# Patient Record
Sex: Female | Born: 1955 | Race: Black or African American | Hispanic: No | Marital: Single | Smoking: Former smoker
Health system: Southern US, Community
[De-identification: ages and names within clinical notes are randomized; demographics above are authoritative.]

## PROBLEM LIST (undated history)

## (undated) ENCOUNTER — Emergency Department (HOSPITAL_COMMUNITY): Discharge status: Against Medical Advice | Payer: Self-pay

## (undated) DIAGNOSIS — M7022 Olecranon bursitis, left elbow: Secondary | ICD-10-CM

## (undated) DIAGNOSIS — I219 Acute myocardial infarction, unspecified: Secondary | ICD-10-CM

## (undated) DIAGNOSIS — I10 Essential (primary) hypertension: Secondary | ICD-10-CM

## (undated) DIAGNOSIS — M549 Dorsalgia, unspecified: Secondary | ICD-10-CM

## (undated) DIAGNOSIS — K219 Gastro-esophageal reflux disease without esophagitis: Secondary | ICD-10-CM

## (undated) DIAGNOSIS — T7840XA Allergy, unspecified, initial encounter: Secondary | ICD-10-CM

## (undated) DIAGNOSIS — E119 Type 2 diabetes mellitus without complications: Secondary | ICD-10-CM

## (undated) DIAGNOSIS — F32A Depression, unspecified: Secondary | ICD-10-CM

## (undated) HISTORY — DX: Acute myocardial infarction, unspecified: I21.9

## (undated) HISTORY — DX: Depression, unspecified: F32.A

## (undated) HISTORY — DX: Allergy, unspecified, initial encounter: T78.40XA

## (undated) HISTORY — PX: ABDOMINAL HYSTERECTOMY: SHX81

---

## 1998-06-07 ENCOUNTER — Encounter: Admission: RE | Admit: 1998-06-07 | Discharge: 1998-09-05 | Payer: Self-pay | Admitting: Unknown Physician Specialty

## 1999-11-19 ENCOUNTER — Emergency Department (HOSPITAL_COMMUNITY): Admission: EM | Admit: 1999-11-19 | Discharge: 1999-11-19 | Payer: Self-pay | Admitting: Internal Medicine

## 2004-03-24 ENCOUNTER — Emergency Department (HOSPITAL_COMMUNITY): Admission: EM | Admit: 2004-03-24 | Discharge: 2004-03-24 | Payer: Self-pay | Admitting: Emergency Medicine

## 2005-01-16 ENCOUNTER — Emergency Department (HOSPITAL_COMMUNITY): Admission: EM | Admit: 2005-01-16 | Discharge: 2005-01-16 | Payer: Self-pay | Admitting: Emergency Medicine

## 2006-07-03 ENCOUNTER — Emergency Department (HOSPITAL_COMMUNITY): Admission: EM | Admit: 2006-07-03 | Discharge: 2006-07-03 | Payer: Self-pay | Admitting: Family Medicine

## 2006-12-11 ENCOUNTER — Encounter (HOSPITAL_COMMUNITY): Admission: RE | Admit: 2006-12-11 | Discharge: 2007-02-19 | Payer: Self-pay | Admitting: Internal Medicine

## 2006-12-13 ENCOUNTER — Ambulatory Visit: Payer: Self-pay | Admitting: Oncology

## 2006-12-16 LAB — COMPREHENSIVE METABOLIC PANEL
AST: 12 U/L (ref 0–37)
Alkaline Phosphatase: 48 U/L (ref 39–117)
BUN: 10 mg/dL (ref 6–23)
Creatinine, Ser: 0.5 mg/dL (ref 0.40–1.20)

## 2006-12-16 LAB — CBC & DIFF AND RETIC
BASO%: 0.8 % (ref 0.0–2.0)
EOS%: 0.9 % (ref 0.0–7.0)
MCH: 14.2 pg — ABNORMAL LOW (ref 26.0–34.0)
MCV: 56.5 fL — ABNORMAL LOW (ref 81.0–101.0)
MONO%: 10.8 % (ref 0.0–13.0)
NEUT#: 8.6 10*3/uL — ABNORMAL HIGH (ref 1.5–6.5)
RBC: 4.98 10*6/uL (ref 3.70–5.32)
RDW: 22.6 % — ABNORMAL HIGH (ref 11.3–14.5)
RETIC #: 113.5 10*3/uL (ref 19.7–115.1)
Retic %: 2.3 % (ref 0.4–2.3)

## 2006-12-16 LAB — IRON AND TIBC
Iron: <10 ug/dL — ABNORMAL LOW (ref 42–145)
UIBC: 412 ug/dL

## 2006-12-23 LAB — CBC WITH DIFFERENTIAL/PLATELET
BASO%: 0.9 % (ref 0.0–2.0)
EOS%: 1.3 % (ref 0.0–7.0)
LYMPH%: 14.4 % (ref 14.0–48.0)
MCH: 14.4 pg — ABNORMAL LOW (ref 26.0–34.0)
MCHC: 25.1 g/dL — ABNORMAL LOW (ref 32.0–36.0)
MONO#: 1.2 10*3/uL — ABNORMAL HIGH (ref 0.1–0.9)
Platelets: 569 10*3/uL — ABNORMAL HIGH (ref 145–400)
RBC: 5.12 10*6/uL (ref 3.70–5.32)
WBC: 10.5 10*3/uL — ABNORMAL HIGH (ref 3.9–10.0)

## 2006-12-26 LAB — HOLD TUBE, BLOOD BANK

## 2006-12-30 LAB — CBC WITH DIFFERENTIAL/PLATELET
BASO%: 1.1 % (ref 0.0–2.0)
Basophils Absolute: 0.1 10*3/uL (ref 0.0–0.1)
EOS%: 1.6 % (ref 0.0–7.0)
HGB: 8.3 g/dL — ABNORMAL LOW (ref 11.6–15.9)
MCH: 15.9 pg — ABNORMAL LOW (ref 26.0–34.0)
MCHC: 25.7 g/dL — ABNORMAL LOW (ref 32.0–36.0)
MONO%: 10.2 % (ref 0.0–13.0)
RBC: 5.18 10*6/uL (ref 3.70–5.32)
RDW: 26.2 % — ABNORMAL HIGH (ref 11.3–14.5)
lymph#: 1.2 10*3/uL (ref 0.9–3.3)

## 2006-12-30 LAB — HOLD TUBE, BLOOD BANK

## 2007-01-06 LAB — CBC WITH DIFFERENTIAL/PLATELET
BASO%: 0.7 % (ref 0.0–2.0)
Eosinophils Absolute: 0.2 10*3/uL (ref 0.0–0.5)
MCHC: 26.2 g/dL — ABNORMAL LOW (ref 32.0–36.0)
MONO#: 1.2 10*3/uL — ABNORMAL HIGH (ref 0.1–0.9)
NEUT#: 9.1 10*3/uL — ABNORMAL HIGH (ref 1.5–6.5)
RBC: 4.3 10*6/uL (ref 3.70–5.32)
RDW: 24.5 % — ABNORMAL HIGH (ref 11.3–14.5)
WBC: 12.2 10*3/uL — ABNORMAL HIGH (ref 3.9–10.0)
lymph#: 1.7 10*3/uL (ref 0.9–3.3)

## 2007-01-13 LAB — CBC WITH DIFFERENTIAL/PLATELET
BASO%: 0.6 % (ref 0.0–2.0)
Eosinophils Absolute: 0.2 10*3/uL (ref 0.0–0.5)
HCT: 26.7 % — ABNORMAL LOW (ref 34.8–46.6)
LYMPH%: 13.5 % — ABNORMAL LOW (ref 14.0–48.0)
MONO#: 2.1 10*3/uL — ABNORMAL HIGH (ref 0.1–0.9)
NEUT#: 10.5 10*3/uL — ABNORMAL HIGH (ref 1.5–6.5)
NEUT%: 70.8 % (ref 39.6–76.8)
Platelets: 503 10*3/uL — ABNORMAL HIGH (ref 145–400)
WBC: 14.8 10*3/uL — ABNORMAL HIGH (ref 3.9–10.0)
lymph#: 2 10*3/uL (ref 0.9–3.3)

## 2007-01-13 LAB — IRON AND TIBC
Iron: 368 ug/dL — ABNORMAL HIGH (ref 42–145)
TIBC: 719 ug/dL — ABNORMAL HIGH (ref 250–470)
UIBC: 351 ug/dL

## 2007-01-13 LAB — TECHNOLOGIST REVIEW

## 2007-01-20 LAB — CBC WITH DIFFERENTIAL/PLATELET
BASO%: 0.4 % (ref 0.0–2.0)
EOS%: 1.1 % (ref 0.0–7.0)
Eosinophils Absolute: 0.1 10*3/uL (ref 0.0–0.5)
LYMPH%: 8.8 % — ABNORMAL LOW (ref 14.0–48.0)
MCHC: 27.3 g/dL — ABNORMAL LOW (ref 32.0–36.0)
MCV: 76.6 fL — ABNORMAL LOW (ref 81.0–101.0)
MONO%: 6.9 % (ref 0.0–13.0)
NEUT#: 10.5 10*3/uL — ABNORMAL HIGH (ref 1.5–6.5)
Platelets: 168 10*3/uL (ref 145–400)
RBC: 5.28 10*6/uL (ref 3.70–5.32)
RDW: 36.6 % — ABNORMAL HIGH (ref 11.3–14.5)
WBC: 12.7 10*3/uL — ABNORMAL HIGH (ref 3.9–10.0)

## 2007-01-29 ENCOUNTER — Ambulatory Visit: Payer: Self-pay | Admitting: Oncology

## 2007-01-31 LAB — CBC WITH DIFFERENTIAL/PLATELET
BASO%: 0 % (ref 0.0–2.0)
Eosinophils Absolute: 0.1 10*3/uL (ref 0.0–0.5)
LYMPH%: 11.1 % — ABNORMAL LOW (ref 14.0–48.0)
MCHC: 30.8 g/dL — ABNORMAL LOW (ref 32.0–36.0)
MONO#: 0.5 10*3/uL (ref 0.1–0.9)
NEUT#: 9.6 10*3/uL — ABNORMAL HIGH (ref 1.5–6.5)
RBC: 4.48 10*6/uL (ref 3.70–5.32)
RDW: 37.8 % — ABNORMAL HIGH (ref 11.3–14.5)
WBC: 11.4 10*3/uL — ABNORMAL HIGH (ref 3.9–10.0)
lymph#: 1.3 10*3/uL (ref 0.9–3.3)

## 2007-02-20 ENCOUNTER — Inpatient Hospital Stay (HOSPITAL_COMMUNITY): Admission: AD | Admit: 2007-02-20 | Discharge: 2007-02-20 | Payer: Self-pay | Admitting: Obstetrics

## 2007-02-24 LAB — CBC WITH DIFFERENTIAL/PLATELET
Basophils Absolute: 0 10*3/uL (ref 0.0–0.1)
Eosinophils Absolute: 0.1 10*3/uL (ref 0.0–0.5)
HGB: 13 g/dL (ref 11.6–15.9)
NEUT#: 12.3 10*3/uL — ABNORMAL HIGH (ref 1.5–6.5)
RDW: 28.4 % — ABNORMAL HIGH (ref 11.3–14.5)
WBC: 14.6 10*3/uL — ABNORMAL HIGH (ref 3.9–10.0)
lymph#: 1.5 10*3/uL (ref 0.9–3.3)

## 2007-02-28 ENCOUNTER — Ambulatory Visit (HOSPITAL_COMMUNITY): Admission: RE | Admit: 2007-02-28 | Discharge: 2007-02-28 | Payer: Self-pay | Admitting: Obstetrics & Gynecology

## 2007-03-10 LAB — MORPHOLOGY: PLT EST: ADEQUATE

## 2007-03-10 LAB — CBC WITH DIFFERENTIAL/PLATELET
Basophils Absolute: 0 10*3/uL (ref 0.0–0.1)
Eosinophils Absolute: 0.1 10*3/uL (ref 0.0–0.5)
HGB: 11.7 g/dL (ref 11.6–15.9)
LYMPH%: 9.8 % — ABNORMAL LOW (ref 14.0–48.0)
MCV: 76.1 fL — ABNORMAL LOW (ref 81.0–101.0)
MONO#: 0.6 10*3/uL (ref 0.1–0.9)
MONO%: 3.7 % (ref 0.0–13.0)
NEUT#: 13.8 10*3/uL — ABNORMAL HIGH (ref 1.5–6.5)
Platelets: 396 10*3/uL (ref 145–400)
RDW: 24 % — ABNORMAL HIGH (ref 11.3–14.5)

## 2007-03-10 LAB — IRON AND TIBC: Iron: 21 ug/dL — ABNORMAL LOW (ref 42–145)

## 2007-03-17 ENCOUNTER — Ambulatory Visit: Payer: Self-pay | Admitting: Oncology

## 2007-03-24 LAB — CBC WITH DIFFERENTIAL/PLATELET
Basophils Absolute: 0 10*3/uL (ref 0.0–0.1)
EOS%: 0.6 % (ref 0.0–7.0)
HCT: 37.2 % (ref 34.8–46.6)
HGB: 12 g/dL (ref 11.6–15.9)
MCH: 24.3 pg — ABNORMAL LOW (ref 26.0–34.0)
MCV: 75.1 fL — ABNORMAL LOW (ref 81.0–101.0)
MONO%: 5.1 % (ref 0.0–13.0)
NEUT%: 86.1 % — ABNORMAL HIGH (ref 39.6–76.8)
RDW: 22.3 % — ABNORMAL HIGH (ref 11.3–14.5)

## 2007-04-02 ENCOUNTER — Encounter: Payer: Self-pay | Admitting: Obstetrics & Gynecology

## 2007-04-04 ENCOUNTER — Inpatient Hospital Stay (HOSPITAL_COMMUNITY): Admission: AD | Admit: 2007-04-04 | Discharge: 2007-04-06 | Payer: Self-pay | Admitting: Obstetrics & Gynecology

## 2007-04-12 ENCOUNTER — Inpatient Hospital Stay (HOSPITAL_COMMUNITY): Admission: AD | Admit: 2007-04-12 | Discharge: 2007-04-12 | Payer: Self-pay | Admitting: Obstetrics

## 2007-05-07 ENCOUNTER — Ambulatory Visit: Payer: Self-pay | Admitting: Oncology

## 2007-05-09 LAB — CBC WITH DIFFERENTIAL/PLATELET
EOS%: 1.2 % (ref 0.0–7.0)
MCH: 25.3 pg — ABNORMAL LOW (ref 26.0–34.0)
MCV: 75.7 fL — ABNORMAL LOW (ref 81.0–101.0)
MONO%: 5.1 % (ref 0.0–13.0)
RBC: 5.7 10*6/uL — ABNORMAL HIGH (ref 3.70–5.32)
RDW: 21.5 % — ABNORMAL HIGH (ref 11.3–14.5)

## 2007-05-09 LAB — COMPREHENSIVE METABOLIC PANEL
AST: 10 U/L (ref 0–37)
Albumin: 4.2 g/dL (ref 3.5–5.2)
Alkaline Phosphatase: 68 U/L (ref 39–117)
Potassium: 4 mEq/L (ref 3.5–5.3)
Sodium: 136 mEq/L (ref 135–145)
Total Protein: 6.6 g/dL (ref 6.0–8.3)

## 2007-05-09 LAB — IRON AND TIBC: UIBC: 255 ug/dL

## 2007-05-09 LAB — FERRITIN: Ferritin: 85 ng/mL (ref 10–291)

## 2007-07-08 ENCOUNTER — Ambulatory Visit: Payer: Self-pay | Admitting: Oncology

## 2007-08-08 LAB — CBC WITH DIFFERENTIAL/PLATELET
BASO%: 0.1 % (ref 0.0–2.0)
LYMPH%: 11.9 % — ABNORMAL LOW (ref 14.0–48.0)
MCHC: 33 g/dL (ref 32.0–36.0)
MONO#: 0.7 10*3/uL (ref 0.1–0.9)
NEUT#: 12.7 10*3/uL — ABNORMAL HIGH (ref 1.5–6.5)
RBC: 5.75 10*6/uL — ABNORMAL HIGH (ref 3.70–5.32)
RDW: 17 % — ABNORMAL HIGH (ref 11.3–14.5)
WBC: 15.4 10*3/uL — ABNORMAL HIGH (ref 3.9–10.0)
lymph#: 1.8 10*3/uL (ref 0.9–3.3)

## 2007-08-08 LAB — MORPHOLOGY
PLT EST: ADEQUATE
RBC Comments: NORMAL

## 2007-08-08 LAB — CHCC SMEAR

## 2007-08-13 LAB — IRON AND TIBC: UIBC: 237 ug/dL

## 2007-08-13 LAB — LACTATE DEHYDROGENASE: LDH: 154 U/L (ref 94–250)

## 2007-08-13 LAB — HEMOGLOBINOPATHY EVALUATION
Hemoglobin Other: 0 % (ref 0.0–0.0)
Hgb A: 97.4 % (ref 96.8–97.8)
Hgb F Quant: 0 % (ref 0.0–2.0)
Hgb S Quant: 0 % (ref 0.0–0.0)

## 2007-08-13 LAB — COMPREHENSIVE METABOLIC PANEL
ALT: 11 U/L (ref 0–35)
Alkaline Phosphatase: 76 U/L (ref 39–117)
CO2: 26 mEq/L (ref 19–32)
Creatinine, Ser: 0.92 mg/dL (ref 0.40–1.20)
Total Bilirubin: 0.3 mg/dL (ref 0.3–1.2)

## 2007-10-29 ENCOUNTER — Ambulatory Visit: Payer: Self-pay | Admitting: Oncology

## 2008-03-16 ENCOUNTER — Ambulatory Visit (HOSPITAL_COMMUNITY): Admission: RE | Admit: 2008-03-16 | Discharge: 2008-03-16 | Payer: Self-pay | Admitting: Obstetrics & Gynecology

## 2008-03-24 ENCOUNTER — Encounter: Admission: RE | Admit: 2008-03-24 | Discharge: 2008-03-24 | Payer: Self-pay | Admitting: Obstetrics & Gynecology

## 2008-04-06 ENCOUNTER — Inpatient Hospital Stay (HOSPITAL_COMMUNITY): Admission: EM | Admit: 2008-04-06 | Discharge: 2008-04-09 | Payer: Self-pay | Admitting: Emergency Medicine

## 2008-08-02 ENCOUNTER — Emergency Department (HOSPITAL_COMMUNITY): Admission: EM | Admit: 2008-08-02 | Discharge: 2008-08-02 | Payer: Self-pay | Admitting: Family Medicine

## 2009-01-05 ENCOUNTER — Emergency Department (HOSPITAL_COMMUNITY): Admission: EM | Admit: 2009-01-05 | Discharge: 2009-01-05 | Payer: Self-pay | Admitting: Emergency Medicine

## 2009-05-26 ENCOUNTER — Emergency Department (HOSPITAL_COMMUNITY): Admission: EM | Admit: 2009-05-26 | Discharge: 2009-05-26 | Payer: Self-pay | Admitting: Emergency Medicine

## 2010-10-02 ENCOUNTER — Encounter: Payer: Self-pay | Admitting: Obstetrics & Gynecology

## 2011-01-23 NOTE — H&P (Signed)
[name], [name]            ACCOUNT NO.:  [id number]   MEDICAL RECORD NO.:  [id number]          PATIENT TYPE:  INP   LOCATION:  9306                          FACILITY:  [facility]   PHYSICIAN:  Roseanna Rainbow, M.D.DATE OF BIRTH:  1956/08/13   DATE OF ADMISSION:  04/02/2007  DATE OF DISCHARGE:                              HISTORY & PHYSICAL   CHIEF COMPLAINT:  Patient is a 55 year old African-American female with  a large leiomyomatous uterus with secondary vaginal hemorrhage.   HISTORY OF PRESENT ILLNESS:  The patient gives a long history of  menometrorrhagia.  She was referred by Dr. Allyne Gee of [facility].  A hemoglobin in April, 2008 was 5.3, and she was transfused  at that point.  Workup to date has included an ultrasound on June 20th  that demonstrated the markedly enlarged uterus, measuring at least 16 cm  in sagittal diameter with multiple fibroids.  There was no  hydronephrosis.  TSH and prolactin were normal.  A Pap smear was  negative.   The patient presented to the office today with heavy bleeding.  She  reports changing a pad every two hours.  She also complains of a  headache.   An attempt was made to produce an amenorrheic state with high dose Depo-  Provera at the patient's last visit in early June.   ALLERGIES:  No known drug allergies.   MEDICATIONS:  Please see the medication reconciliation form.   PAST MEDICAL HISTORY:  Depression, hypertension.   SOCIAL HISTORY:  She is divorced.  She is retired.  She currently smokes  one pack per day.  She does not give any significant history of alcohol  use.  She denies illicit drug use.   FAMILY HISTORY:  Remarkable for diabetes mellitus and breast cancer.   PAST GYN HISTORY:  Noncontributory.   PAST OBSTETRICAL HISTORY:  There is a history of four miscarriages or  abortions.  She has no living children.   REVIEW OF SYSTEMS:  NEUROLOGIC:  Please see the above.  GU:  Please see  the above.   PHYSICAL EXAMINATION:  VITAL SIGNS:  Temperature 98.4, pulse 84, blood  pressure 153/74.  Height 5 feet 8 inches.  Weight 227 pounds.  GENERAL:  Well-developed, well-nourished, no apparent distress.  LUNGS:  Clear to auscultation bilaterally.  HEART:  Regular rate and rhythm.  ABDOMEN:  There is a mass arising from the pelvis in the midline to the  level of the umbilicus.  This mass is nontender.  PELVIC:  Normal EG/BUS.  Speculum exam of the vagina is clean.  The  cervix is clear, firm, and closed.  No visible lesions.  No abnormal  discharge.  On bimanual exam, the uterus is markedly enlarged at  approximately 20 weeks, aggregate size with broad ligament involvement  of the fibroids on the right side.   ASSESSMENT:  Symptomatic large leiomyomatous uterus with a secondary  vaginal hemorrhage in the setting of iron-deficiency anemia.  She is  hemodynamically stable at present.   PLAN:  Admission.  Will check a hemoglobin, strict pad count.  The plan  is to proceed with a total abdominal hysterectomy on July 24th.  The  risks, benefits and alternative forms of management were reviewed with  the patient, and informed consent was obtained.  Education sheets were  given regarding hysterectomy.  She was counseled regarding prophylactic  oophorectomy at the time of surgery.  She was also counseled that in the  likelihood she is profoundly anemic, she will be transfused  preoperatively.      Roseanna Rainbow, M.D.  Electronically Signed     LAJ/MEDQ  D:  04/02/2007  T:  04/02/2007  Job:  [id number]   cc:   Candyce Churn. Allyne Gee, M.D.  Fax: [phone]

## 2011-01-23 NOTE — Discharge Summary (Signed)
[name], [name]            ACCOUNT NO.:  [id number]   MEDICAL RECORD NO.:  [id number]          PATIENT TYPE:  INP   LOCATION:  9306                          FACILITY:  [facility]   PHYSICIAN:  Roseanna Rainbow, M.D.DATE OF BIRTH:  Jul 13, 1956   DATE OF ADMISSION:  04/04/2007  DATE OF DISCHARGE:  04/06/2007                               DISCHARGE SUMMARY   CHIEF COMPLAINT:  The patient is a 55 year old African-American female  with large leiomyomatous uterus with secondary vaginal hemorrhage.  Please see the dictated history and physical for further details.   HOSPITAL COURSE:  The patient was admitted. She underwent a total  abdominal hysterectomy. Please see the dictated operative summary for  further details. Please note that preoperatively, her capillary blood  glucose testing values between 250 and 400 on April 03, 2007.  Preoperatively, her hemoglobin was 11.8. She had not been previously  diagnosed. She had a remote history of glucose intolerance, however, she  had lost a significant amount of weight and this had resolved, as per  the patient. On postoperative day 1, her hemoglobin was 11.3.  Preoperatively, she had been started on oral hypoglycemic agents. She  was also given a sliding scale insulin regimen. For the remainder of her  course, her capillary blood glucose values were less than 250. On  postoperative day 2, she was tolerating a regular ADA diet. She was then  discharged to home. A hemoglobin A1C was 9.3 on April 03, 2007.   DISCHARGE DIAGNOSES:  1. Symptomatic uterine fibroids.  2. Diabetes mellitus.   PROCEDURE:  1. Total abdominal hysterectomy.   CONDITION ON DISCHARGE:  Stable.   DIET:  ADA diet.   ACTIVITY:  Pelvic rest. No strenuous activity.   DISPOSITION:  The patient was to followup in the office in 1 week for  staple removal and she was to call Dr. Dorothyann Peng to arrange  followup.   DISCHARGE MEDICATIONS:  Included Glucophage 2 grams  with dinner,  Percocet 1 to 2 tabs every 6 hours as needed, Glipizide 5 mg with  breakfast, Ibuprofen, Lisinopril, Repleva, Cymbalta, and Metanx tablets.      Roseanna Rainbow, M.D.  Electronically Signed     LAJ/MEDQ  D:  04/25/2007  T:  04/26/2007  Job:  [id number]   cc:   Candyce Churn. Allyne Gee, M.D.  Fax: [phone]

## 2011-01-23 NOTE — H&P (Signed)
[name], [name]            ACCOUNT NO.:  [id number]   MEDICAL RECORD NO.:  [id number]          PATIENT TYPE:  INP   LOCATION:  2921                         FACILITY:  [facility]   PHYSICIAN:  Hillery Aldo, M.D.   DATE OF BIRTH:  1956/03/04   DATE OF ADMISSION:  04/06/2008  DATE OF DISCHARGE:                              HISTORY & PHYSICAL   PRIMARY CARE PHYSICIAN:  Robyn N. Allyne Gee, M.D.   CHIEF COMPLAINT:  Rash/boils.   HISTORY OF PRESENT ILLNESS:  The patient is a 55 year old female with a  past medical history of boils in the past who presents with multiple  skin boils especially in the groin, vaginal area, and in the upper peri-  umbilical area on the abdomen surrounded by extensive erythema.  The  patient also has some boils on her breast bilaterally.  There is a  history of similar eruptions which have responded to incision and  drainage as well as antibiotics.  The patient denies any fever or  chills.  She presented to the hospital at the behest of her OB/GYN for  evaluation.   PAST MEDICAL HISTORY:  1. Anxiety/depression.  2. Hypertension.  3. Diabetes mellitus x4 years.   PAST SURGICAL HISTORY:  1. TAH secondary to fibroids and menometrorrhagia.  2. Incision and drainage of skin boil.   FAMILY HISTORY:  The patient's mother is alive at 74 and has diabetes  and hypertension.  The patient's father is alive at 8 and also has  diabetes and hypertension.  She has one sister who died at the age of 80  from breast cancer.  One healthy brother.  She has no offsprings.   SOCIAL HISTORY:  The patient is divorced.  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  She has a history of  smoking tobacco, 1-pack per day, but did quit for approximately 12  years.  She is currently smoking about a pack per day.  She denies any  alcohol or drug use.   ALLERGIES:  No known drug allergies.   CURRENT MEDICATIONS:  1. Actos.  2. Lisinopril.  3.  Cymbalta (discontinued on own several weeks ago).   REVIEW OF SYSTEMS:  Only positive for headache and occasional  constipation.  Comprehensive 14-point review of systems is otherwise  negative.   PHYSICAL EXAMINATION:  VITAL SIGNS:  Temperature 98, pulse 78,  respirations 14, blood pressure 153/82, and O2 saturation 99% on room  air.  GENERAL:  Obese, black female who is in no acute distress.  HEENT:  Normocephalic and atraumatic. PERRL.  EOMI.  Oropharynx is  clear.  NECK:  Supple, no thyromegaly, no lymphadenopathy, no jugular venous  distention.  CHEST:  Clear to auscultation bilaterally with good air movement.  HEART:  Regular rate, and rhythm.  No murmurs, rubs, or gallops.  BREAST:  Boils bilaterally.  ABDOMEN:  Soft, nontender, and nondistended with normoactive bowel  sounds.  There is a large erythematous central area with some fluctuance  concerning for an underlying abscess.  EXTREMITIES:  No clubbing, edema, and cyanosis.  SKIN:  Multiple  boils in the groin, breast, and mid abdomen as  described.  NEUROLOGIC:  Alert and oriented x3.  Cranial nerves II through XII is  grossly intact.  Nonfocal.   DATA REVIEW:  Sodium is 139, potassium 3.7, chloride 106, bicarb 14, BUN  14, creatinine 0.5, and glucose 296.  White blood cell count is 21.3,  hemoglobin 15, hematocrit 47.6, and platelets 218.  Venous blood gas  reveals a pH of 7.391, PCO2 44.8, and PO2 24.  Urinalysis is positive  for glucose that is greater than 100 mg/dL, ketones of greater 161  mg/dL, but is negative for nitrites and leukocyte esterase.  Serum  ketones are weakly positive.   ASSESSMENT AND PLAN:  1. Early diabetic ketoacidosis:  The patient does have a anion-gap      metabolic acidosis.  We will check arterial blood gases on      Saturday.  We will initiate insulin drip via Glucommander protocol      and vigorously hydrate her.  We will transition over to      subcutaneous insulin when indicated.  2.  Cellulitis/abscess:  The patient has a history of multiple skin      abscesses in the past.  This was likely staph infection.  She has      been empirically started on vancomycin which she will continue.  We      will get a surgical consultation for incision and drainage of the      likely large abscess in her mid-abdomen.  We performed a culture of      her naris to see if she is colonized with staph as well.  She will      use Hibiclens showers daily.  3. Hypertension:  We will resume the patient's lisinopril at a dose of      10 mg daily.  We will clarify home dosage.  4. Anxiety/depression:  We will start the patient back on Cymbalta at      a dose of 30 mg daily and titrate it to effect.  5. Prophylaxis:  We will initiate PAS hoses for deep vein thrombosis      prophylaxis given that she will likely require a surgical incision      and drainage of her abscess.  We will place the patient on Protonix      for gastrointestinal stress ulcer prophylaxis.      Hillery Aldo, M.D.  Electronically Signed     CR/MEDQ  D:  04/06/2008  T:  04/07/2008  Job:  [id number]   cc:   Candyce Churn. Allyne Gee, M.D.

## 2011-01-23 NOTE — Discharge Summary (Signed)
[name], [name]            ACCOUNT NO.:  [id number]   MEDICAL RECORD NO.:  [id number]          PATIENT TYPE:  INP   LOCATION:  6731                         FACILITY:  [facility]   PHYSICIAN:  Ladell Pier, M.D.   DATE OF BIRTH:  12-01-1955   DATE OF ADMISSION:  04/06/2008  DATE OF DISCHARGE:  04/09/2008                               DISCHARGE SUMMARY   DISCHARGE DIAGNOSES:  1. Abdominal wall cellulitis.  2. Below-knee amputation.  3. Hypertension.  4. Type 2 diabetes x4 years.  5. Anxiety.  6. Depression.  7. Hypokalemia.  8. Leukocytosis.   DISCHARGE MEDICATIONS:  1. Doxycycline 100 mg twice daily x14 days.  2. Lisinopril 40 mg daily.  3. Hydrochlorothiazide 12.5 mg daily.  4. Actos 45 mg daily.  5. Cymbalta 30 mg daily.  6. Lantus 10 units at bedtime.  7. NovoLog sliding scale insulin as directed.   FOLLOWUP APPOINTMENT:  The patient is to follow up with Dr. Allyne Gee in 1  week.   PROCEDURES:  None.   CONSULTANTS:  General Surgery.   HISTORY OF PRESENT ILLNESS:  The patient is a 55 year old female with a  past medical history of falls in the past.  She presented to the  emergency room with multiple boils especially in the groin, vaginal  area, and marginal areas of the abdomen surrounded by extensive  erythema.  Please see Dr. York Ram admission note for remainder of  history.   Past medical history, family history, social history, allergies, review  of systems as per admission H&P.   PHYSICAL EXAMINATION ON DISCHARGE:  VITAL SIGNS:  Temperature is 98,  pulse 60, respirations 18, blood pressure 179/77, and pulse ox 94% on  room air.  CBG 186-192.  HEENT: Normocephalic and atraumatic.  Pupils reactive to light.  Throat  without erythema.  CARDIOVASCULAR:  Regular rate and rhythm.  LUNGS:  Clear bilaterally.  ABDOMEN:  Positive bowel sounds.  She does have some induration and  erythema on the abdominal wall that is much improved with minimal  tenderness.  EXTREMITIES:  Without edema.   HOSPITAL COURSE:  1. Boil/cellulitis/abscess especially in the abdominal wall:  The      patient was admitted to the hospital.  She was placed on      vancomycin.  General Surgery was consulted.  They followed the      patient throughout her hospital stay.  They did not feel that      drainage was indicated.  They recommend continuing antibiotic for      resolution of the abdominal wall cellulitis/abscess.  2. The patient will be discharged home on doxycycline for 14 days.      She will then follow up with her primary care physician within a      week to see if she needs to continue with extended treatment.  3. Type 2 diabetes:  The patient came in, she was hyperglycemic.  She      was placed on Glucommander drip.  Readings noted that she had DKA,      but reviewing her bicarb, her bicarb was normal at 21 when  she came      in.  The patient most likely was hyperglycemic.  She was spilling      glucose in her urine.  She was placed on a Glucommander drip and      her blood sugars normalized.  We started her on her Actos, sent her      home also on Lantus and sliding scale insulin.  When she follows up      with her primary care doctor, she will readdress whether she should      go on something like Januvia metformin or other medications then      she could to wean off the Lantus and sliding scale insulin.  Also,      discussed this with the patient.  4. Hypertension:  Her blood pressure was high while she was inpatient,      increased her lisinopril, blood pressure still was elevated, added      hydrochlorothiazide.  She will follow up with her primary care to      further recheck her blood pressures.  5. Leukocytosis:  She was noted to have leukocytosis on admission.      This is most likely secondary to the abscess/cellulitis of the      abdominal wall.  6. Hypokalemia:  She was noted to be hypokalemic and her potassium was      repleted.   DISCHARGE  LABS:  We did a nasal swab that did not show any Staph.  Sodium 135, potassium 3.4, chloride 105, CO2 25, glucose 193, BUN 7, and  creatinine 0.40.  WBC 10.4, hemoglobin 13.1, platelet 220,000, and MCV  77.2.  Hemoglobin A1c 13.7.      Ladell Pier, M.D.  Electronically Signed     NJ/MEDQ  D:  04/09/2008  T:  04/10/2008  Job:  [id number]   cc:   Candyce Churn. Allyne Gee, M.D.

## 2011-01-23 NOTE — Consult Note (Signed)
NAMEMarland Kitchen  DEMYAH, SMYRE NO.:  [id number]   MEDICAL RECORD NO.:  [id number]          PATIENT TYPE:  INP   LOCATION:  2921                         FACILITY:  [facility]   PHYSICIAN:  Velora Heckler, MD      DATE OF BIRTH:  08-Jun-1956   DATE OF CONSULTATION:  DATE OF DISCHARGE:                                 CONSULTATION   REQUESTING PHYSICIAN:  Hillery Aldo, MD   CONSULTING SURGEON:  Velora Heckler, MD, FACS   PRIMARY CARE PHYSICIAN:  Candyce Churn. Allyne Gee, MD   OB/GYN:  Dr. Jeraldine Loots.   REASON FOR CONSULTATION:  Abdominal abscess/cellulitis.   HISTORY OF PRESENT ILLNESS:  Ms. Hannig is a 55 year old black female  with a history of depression, diabetes, and hypertension who began  having abdominal pain on Friday due to a boil.  She states that she had  a small lesion on her right breast that ruptured last week and then on  Friday she began to get a swell on her abdomen.  She states that it has  progressively gotten larger and increased in pain.  Therefore, today  because of this increasing pain, she presented to the ER.  Once here,  the patient was admitted by Dr. Darnelle Catalan in her service and her white blood  cell count was found to be 21,300 with a CBG of 296.  Because of this  abdominal cellulitis, we were asked to see the patient for possible  surgical intervention.   REVIEW OF SYSTEMS:  Please see HPI.  Otherwise, all other systems are  negative.   FAMILY HISTORY:  Noncontributory.   PAST MEDICAL HISTORY:  1. Significant for depression.  2. Diabetes mellitus.  3. Hypertension.   PAST SURGICAL HISTORY:  1. Abdominal hysterectomy.  Her ovary were left in place.  2. A recent I&D by Dr. Jeraldine Loots, the patient's OB/GYN, for an abscess in      her left groin.   SOCIAL HISTORY:  The patient is divorced and currently does not have any  children.  She admits to smoking approximately 1 pack of cigarettes a  day and however she does not drink or does not use any illicit  drugs.  She also admits to being a Runner, broadcasting/film/video.   ALLERGIES:  NKDA.   MEDICATIONS:  Doses are not known.  1. Cymbalta.  2. Actos.  3. Lisinopril.  The patient also states that she has not taken all of these in at least  one month.   PHYSICAL EXAMINATION:  GENERAL:  This is a 55 year old black female who  is lying in bed currently, in no acute distress.  VITAL SIGNS:  Temperature 97.9, pulse 96, respirations 14, and blood  pressure 130/80.  EYES:  Sclerae noninjected.  Pupils were equal, round, and reactive to  light.  EARS, NOSE, MOUTH, AND THROAT:  Ears and nose, no obvious masses or  lesions.  No rhinorrhea.  Mouth is pink and moist with poor dentition.  Throat shows no exudate.  NECK:  Supple.  Trachea is midline.  No thyromegaly.  HEART:  Regular rate and rhythm.  Normal S1 and S2.  No murmurs,  gallops, or rubs are noted.  A +2 carotid, pedal, and radial pulses  bilaterally.  LUNGS:  Clear to auscultation bilaterally with no wheezes, rhonchi, or  rales noted.  Respiratory effort is nonlabored.  CHEST:  Symmetrical.  ABDOMEN:  Soft.  Very tender over a large approximately 15 x 8 cm  abdominal wall abscess/cellulitis.  At the top of this area, there  appear to be three small openings, which could contribute to this  abscess.  Currently, these are not draining.  This area does appear to  feel indurated and not fluctuant.  Otherwise, the belly is obese with  active bowel sounds.  Also, of note over this large area, the patient's  skin is very warm to touch and is very erythematous.  MUSCULOSKELETAL:  All 4 extremities are symmetrical with no cyanosis,  clubbing, or edema.  SKIN:  Warm and dry.  There is an area at approximately 5 o'clock on the  right breast where it appears there was an old boil that has begun  healing.  There are no other obvious rashes, lesions, or masses.  However, the patient does have some areas along her back and arms that  appear to have some  hypopigmentation to them.  NEUROLOGIC:  The patient's cranial nerves II through XII appear to be  grossly intact.  Deep tendon reflex exam is deferred at this time.  PSYCH:  The patient is alert and oriented x3 with an appropriate affect.   LABS AND DIAGNOSTICS:  White blood cell count is 21,300.  Hemoglobin is  15, hematocrit 47.3, platelets are 218,000.  Neutrophils 87%, sodium  139, potassium 3.7, BUN 14, creatinine 0.5, and glucose is 296.   IMPRESSION:  1. Abdominal wall cellulitis  2. Diabetes.  3. Hypertension.   PLAN:  At this time, we agree with the patient being put on antibiotics.  Since this area appears to be presenting like a MRSA cellulitis with the  fact that this area is not fluctuant, but indurated, we do not feel that  the patient would benefit from an I&D at this time.  Therefore, we would  recommend CBC in the morning.  If for some reason the  patient begins to worsen or this area begins to feel more fluctuant, the  patient may benefit from some kind of diagnostic imaging or operative  intervention.  The patient has been discussed with Dr. Gerrit Friends and he has  seen and examined the patient with me.      Letha Cape, PA      Velora Heckler, MD  Electronically Signed    KEO/MEDQ  D:  04/06/2008  T:  04/07/2008  Job:  [id number]   cc:   Hillery Aldo, M.D.  Candyce Churn. Allyne Gee, M.D.  Dr. Jeraldine Loots

## 2011-01-23 NOTE — Op Note (Signed)
[name], [name]            ACCOUNT NO.:  [id number]   MEDICAL RECORD NO.:  [id number]          PATIENT TYPE:  INP   LOCATION:  9306                          FACILITY:  [facility]   PHYSICIAN:  Roseanna Rainbow, M.D.DATE OF BIRTH:  June 30, 1956   DATE OF PROCEDURE:  04/04/2007  DATE OF DISCHARGE:                               OPERATIVE REPORT   PREOPERATIVE DIAGNOSIS:  Symptomatic uterine fibroids with secondary  menometrorrhagia.   POSTOPERATIVE DIAGNOSIS:  Symptomatic uterine fibroids with secondary  menometrorrhagia.   PROCEDURE:  Total abdominal hysterectomy.   SURGEON:  Roseanna Rainbow, M.D.   ANESTHESIA:  General endotracheal anesthesia.   ESTIMATED BLOOD LOSS:  200 mL.   COMPLICATIONS:  None.   IV FLUIDS AND URINE OUTPUT:  As per anesthesiology.   PROCEDURE IN DETAIL:  The patient was taken to the operating room with  an IV running.  The patient was given general anesthesia without  difficulty.  She was placed in the dorsal lithotomy position and prepped  and draped in the usual sterile fashion.  After a time out, a midline  vertical incision was then made with the scalpel and carried bluntly  down to the fascia.  The fascia was incised along the length of the  incision.  The parietal peritoneum was tented up and entered.  The  peritoneal incision was then extended superiorly and inferiorly with  good visualization of the bladder.   At this point, the diffusely enlarged uterus, irregular in shape and  contour, was delivered into the incision.  The bowel was packed away  with moistened laparotomy packs.  The Deaver was replaced into the  incision for retraction.  A core was placed in the uterine fundus to  assist with elevation of the uterus.  The round ligaments on both sides  were divided with the Bovie.  The broad ligament was then incised  anteriorly to the midline bilaterally.  The fallopian tubes and utero-  ovarian ligaments bilaterally were doubly  clamped, transected and both  free ligatures and suture ligatures of 0 Vicryl were placed.  Adequate  hemostasis was noted.  The uterine arteries were then clamped  bilaterally, transected and suture ligated with 0 Vicryl.  At this  point, the uterine fundus was amputated above the level of the uterine  artery pedicles.  A Balfour retractor was then placed into the incision  and the bowel repacked with moistened laparotomy packs.  Kochers had  been placed on the lower uterine segment.   The cardinal ligaments and uterosacral ligaments were then clamped with  parametrial clamps, transected and suture ligated.  The cervix was then  amputated with scissors.  The remainder of the vaginal cuff was closed  with a series of figure-of-eight sutures of 0 Vicryl.  Adequate  hemostasis was noted.  The pelvis was then copiously irrigated.  The  pedicles and the vaginal cuff were inspected and hemostasis was felt to  be adequate.  The retractors and all the sponges were then removed from  the abdomen.  The fascia and parietal peritoneum were closed in a single  layer  with a  running suture of #1 PDS.  The skin was closed with staples.  At  the close of the procedure, the instrument and pack counts were said to  be correct x2.  The patient was awakened from general anesthesia and  taken to the PACU in stable condition.      Roseanna Rainbow, M.D.  Electronically Signed     LAJ/MEDQ  D:  04/04/2007  T:  04/04/2007  Job:  [id number]

## 2011-06-08 LAB — BASIC METABOLIC PANEL
BUN: 11
BUN: 11
BUN: 7
CO2: 25
CO2: 26
Calcium: 8.4
Calcium: 8.5
Calcium: 8.5
Chloride: 105
Chloride: 106
Creatinine, Ser: 0.31 — ABNORMAL LOW
Creatinine, Ser: 0.4
GFR calc Af Amer: >60
GFR calc Af Amer: >60
GFR calc Af Amer: >60
GFR calc non Af Amer: >60
GFR calc non Af Amer: >60
GFR calc non Af Amer: >60
GFR calc non Af Amer: >60
Glucose, Bld: 129 — ABNORMAL HIGH
Potassium: 3.3 — ABNORMAL LOW
Potassium: 3.4 — ABNORMAL LOW
Potassium: 3.4 — ABNORMAL LOW
Potassium: 3.6
Sodium: 135
Sodium: 137
Sodium: 138
Sodium: 139
Sodium: 140

## 2011-06-08 LAB — CBC
HCT: 40.6
HCT: 42.1
Hemoglobin: 13
MCHC: 31.7
MCHC: 32.3
MCV: 77.2 — ABNORMAL LOW
MCV: 78.3
MCV: 78.9
Platelets: 200
RBC: 5.26 — ABNORMAL HIGH
RBC: 5.37 — ABNORMAL HIGH
RBC: 6 — ABNORMAL HIGH
RDW: 15.6 — ABNORMAL HIGH
RDW: 15.9 — ABNORMAL HIGH
WBC: 11.6 — ABNORMAL HIGH

## 2011-06-08 LAB — POCT I-STAT, CHEM 8
BUN: 14
Creatinine, Ser: 0.5
Hemoglobin: 17.3 — ABNORMAL HIGH
Potassium: 3.7
Sodium: 139
Sodium: 140
TCO2: 23

## 2011-06-08 LAB — HEPATIC FUNCTION PANEL
ALT: 22
AST: 16
Indirect Bilirubin: 0.6
Total Protein: 6.8

## 2011-06-08 LAB — DIFFERENTIAL
Basophils Relative: 0
Eosinophils Absolute: 0.1
Monocytes Absolute: 1.7 — ABNORMAL HIGH
Monocytes Relative: 8
Neutrophils Relative %: 87 — ABNORMAL HIGH

## 2011-06-08 LAB — POCT I-STAT 3, ART BLOOD GAS (G3+)
pCO2 arterial: 35.5
pH, Arterial: 7.439 — ABNORMAL HIGH

## 2011-06-08 LAB — MRSA CULTURE

## 2011-06-08 LAB — POCT I-STAT 3, VENOUS BLOOD GAS (G3P V)
Acid-Base Excess: 2
O2 Saturation: 42
pCO2, Ven: 44.8 — ABNORMAL LOW

## 2011-06-08 LAB — POCT URINALYSIS DIP (DEVICE)
Glucose, UA: >=1000 — AB
Ketones, ur: >=160 — AB
Operator id: 239701
Specific Gravity, Urine: 1.02

## 2011-06-12 LAB — CULTURE, ROUTINE-ABSCESS: Gram Stain: NONE SEEN

## 2011-06-25 LAB — CBC
HCT: 35.3 — ABNORMAL LOW
Hemoglobin: 11.3 — ABNORMAL LOW
Hemoglobin: 11.8 — ABNORMAL LOW
MCHC: 31.4
MCHC: 32.2
MCV: 79
Platelets: 328
Platelets: 335
RBC: 4.46
RDW: 24.8 — ABNORMAL HIGH
RDW: 25.5 — ABNORMAL HIGH
WBC: 13.2 — ABNORMAL HIGH

## 2011-06-25 LAB — BASIC METABOLIC PANEL
BUN: 13
BUN: 4 — ABNORMAL LOW
CO2: 30
Calcium: 8.5
Calcium: 9.3
Chloride: 103
Creatinine, Ser: 0.38 — ABNORMAL LOW
Creatinine, Ser: 0.6
GFR calc Af Amer: >60
GFR calc non Af Amer: >60
GFR calc non Af Amer: >60
Glucose, Bld: 217 — ABNORMAL HIGH
Glucose, Bld: 416 — ABNORMAL HIGH
Potassium: 3.5
Potassium: 3.5
Sodium: 137

## 2011-06-25 LAB — TYPE AND SCREEN
ABO/RH(D): A POS
Antibody Screen: NEGATIVE

## 2011-06-25 LAB — PREGNANCY, URINE: Preg Test, Ur: NEGATIVE

## 2011-06-25 LAB — HEMOGLOBIN A1C: Hgb A1c MFr Bld: 9.3 — ABNORMAL HIGH

## 2013-03-12 ENCOUNTER — Other Ambulatory Visit (HOSPITAL_COMMUNITY): Payer: Self-pay | Admitting: Internal Medicine

## 2013-03-12 DIAGNOSIS — Z1231 Encounter for screening mammogram for malignant neoplasm of breast: Secondary | ICD-10-CM

## 2013-03-24 ENCOUNTER — Ambulatory Visit (HOSPITAL_COMMUNITY): Payer: Self-pay | Attending: Internal Medicine

## 2013-03-30 ENCOUNTER — Other Ambulatory Visit (HOSPITAL_COMMUNITY): Payer: Self-pay | Admitting: Internal Medicine

## 2013-03-30 DIAGNOSIS — Z1231 Encounter for screening mammogram for malignant neoplasm of breast: Secondary | ICD-10-CM

## 2013-03-31 ENCOUNTER — Ambulatory Visit (HOSPITAL_COMMUNITY)
Admission: RE | Admit: 2013-03-31 | Discharge: 2013-03-31 | Disposition: A | Discharge status: Home / Self Care | Payer: BC Managed Care – PPO | Source: Ambulatory Visit | Attending: Internal Medicine | Admitting: Internal Medicine

## 2013-03-31 DIAGNOSIS — Z1231 Encounter for screening mammogram for malignant neoplasm of breast: Secondary | ICD-10-CM | POA: Insufficient documentation

## 2014-01-07 ENCOUNTER — Emergency Department (HOSPITAL_COMMUNITY)
Admission: EM | Admit: 2014-01-07 | Discharge: 2014-01-07 | Disposition: A | Discharge status: Home / Self Care | Payer: Worker's Compensation | Attending: Emergency Medicine | Admitting: Emergency Medicine

## 2014-01-07 ENCOUNTER — Emergency Department (HOSPITAL_COMMUNITY): Payer: Worker's Compensation

## 2014-01-07 DIAGNOSIS — Y9389 Activity, other specified: Secondary | ICD-10-CM | POA: Insufficient documentation

## 2014-01-07 DIAGNOSIS — S42302A Unspecified fracture of shaft of humerus, left arm, initial encounter for closed fracture: Secondary | ICD-10-CM

## 2014-01-07 DIAGNOSIS — E119 Type 2 diabetes mellitus without complications: Secondary | ICD-10-CM | POA: Insufficient documentation

## 2014-01-07 DIAGNOSIS — Y9229 Other specified public building as the place of occurrence of the external cause: Secondary | ICD-10-CM | POA: Insufficient documentation

## 2014-01-07 DIAGNOSIS — W07XXXA Fall from chair, initial encounter: Secondary | ICD-10-CM | POA: Insufficient documentation

## 2014-01-07 DIAGNOSIS — W1809XA Striking against other object with subsequent fall, initial encounter: Secondary | ICD-10-CM | POA: Insufficient documentation

## 2014-01-07 DIAGNOSIS — S42213A Unspecified displaced fracture of surgical neck of unspecified humerus, initial encounter for closed fracture: Secondary | ICD-10-CM | POA: Insufficient documentation

## 2014-01-07 DIAGNOSIS — Y990 Civilian activity done for income or pay: Secondary | ICD-10-CM | POA: Insufficient documentation

## 2014-01-07 DIAGNOSIS — I10 Essential (primary) hypertension: Secondary | ICD-10-CM | POA: Insufficient documentation

## 2014-01-07 MED ORDER — OXYCODONE-ACETAMINOPHEN 5-325 MG PO TABS
2.0000 | ORAL_TABLET | Freq: Once | ORAL | Status: AC
  Administered 2014-01-07: 2 via ORAL
  Filled 2014-01-07: qty 2

## 2014-01-07 MED ORDER — HYDROCODONE-ACETAMINOPHEN 5-325 MG PO TABS: 1.0000 | ORAL_TABLET | ORAL | Status: DC | PRN

## 2014-01-07 MED ORDER — ONDANSETRON 4 MG PO TBDP
4.0000 mg | ORAL_TABLET | Freq: Once | ORAL | Status: AC
  Administered 2014-01-07: 4 mg via ORAL
  Filled 2014-01-07: qty 1

## 2014-01-07 MED ORDER — NAPROXEN 500 MG PO TABS: 500.0000 mg | ORAL_TABLET | Freq: Two times a day (BID) | ORAL | Status: DC

## 2014-01-07 NOTE — ED Notes (Signed)
Pt was at work when she tripped and fell landing on her left shoulder and left side of her face/head resulting in pain and bruising to left face. SBP 220 per EMS, NKA, hx of DM, HTN. Pt stable on arrival.

## 2014-01-07 NOTE — Discharge Instructions (Signed)
Shoulder Fracture (Proximal Humerus or Glenoid) °A shoulder fracture is a broken upper arm bone or a broken socket bone. The humerus is the upper arm bone and the glenoid is the shoulder socket. Proximal means the humerus is broken near the shoulder. Most of the time the bones of a broken shoulder are in an acceptable position. Usually, the injury can be treated with a shoulder immobilizer or sling and swath bandage. These devices support the arm and prevent any shoulder movement. If the bones are not in a good position, then surgery is sometimes needed. Shoulder fractures usually initially cause swelling, pain, and discoloration around the upper arm. They heal in 8 to 12 weeks with proper treatment. °SYMPTOMS  °At the time of injury: °· Pain. °· Tenderness. °· Regular body contours are not normal. °Later symptoms may include: °· Swelling and bruising of the elbow and hand. °· Swelling and bruising of the arm or chest. °Other symptoms include: °· Pain when lifting or turning the arm. °· Paralysis below the fracture. °· Numbness or coldness below the fracture. °CAUSES  °· Indirect force from falling on an outstretched arm. °· A blow to the shoulder. °RISK INCREASES WITH: °· Not being in shape. °· Playing contact sports, such as football, soccer, hockey, or rugby. °· Sports where falling on an outstretched arm occurs, such as basketball, skateboarding, or volleyball. °· History of bone or joint disease. °· History of shoulder injury. °PREVENTION °· Warm up before activity. °· Stretch before activity. °· Stay in shape with your: °· Heart fitness. °· Flexibility. °· Shoulder Strength. °· Falling with the proper technique. °PROGNOSIS  °In adults, healing time is about 7 weeks. For children, healing time is about 5 weeks. Surgery may be needed. °RELATED COMPLICATIONS °· The bones do not heal together (nonunion). °· The bones do not align properly when they heal (malunion). °· Long-term problems with pain, stiffness,  swelling, or loss of motion. °· The injured arm heals shorter than the other. °· Nerves are injured in the arm. °· Arthritis in the shoulder. °· Normal bone growth is interrupted in children. °· Blood supply to the shoulder joint is diminished. °TREATMENT °If the bones are aligned, then initial treatment will be with ice and medicine to help with pain. The shoulder will be held in place with a sling (immobilization). The shoulder will be allowed to heal for up to 6 weeks. Injuries that may need surgery include: °· Severe fractures. °· Fractures that are not in appropriate alignment (displaced). °· Non-displaced fractures (not common). °Surgery helps the bones align correctly. The bones may be held in place with: °· Sutures. °· Wires. °· Rods. °· Plates. °· Screws. °· Pins. °If you have had surgery or not, you will likely be assisted by a physical therapist or athletic trainer to get the best results with your injured shoulder. This will likely include exercises to strengthen and stretch the injured and surrounding areas. °MEDICATION °· If pain medicine is needed, nonsteroidal anti-inflammatory medicines (such as aspirin or ibuprofen) or other minor pain relievers (such as acetaminophen) are often advised. °· Do not take pain medicine for 7 days before surgery. °· Stronger pain relievers may be prescribed. Use only as directed and take only as much as you need. °COLD THERAPY °Cold treatment (icing) relieves pain and reduces inflammation. Cold treatment should be applied for 10 to 15 minutes every 2 to 3 hours, and immediately after activity that aggravates your symptoms. Use ice packs or an ice massage. °SEEK IMMEDIATE   MEDICAL CARE IF: °· You have severe shoulder pain unrelieved by rest and taking pain medicine. °· You have pain, numbness, tingling, or weakness in the hand or wrist. °· You have shortness of breath, chest pain, severe weakness, or fainting. °· You have severe pain with motion of the fingers or  wrist. °· Blue, gray, or dark color appears in the fingernails on injured extremity. °Document Released: 08/27/2005 Document Revised: 11/19/2011 Document Reviewed: 12/09/2008 °[employer]® Patient Information ©2014 [employer]. ° °

## 2014-01-07 NOTE — ED Provider Notes (Signed)
CSN: [id number]     Arrival date & time 01/07/14  [id number] History   First MD Initiated Contact with Patient 01/07/14 1007     Chief Complaint  Patient presents with  . Fall  . Shoulder Pain     (Consider location/radiation/quality/duration/timing/severity/associated sxs/prior Treatment) HPI [name] is a(n) 58 y.o. female who presents to the ED w cc L shoulder pain after mechanical fall. Patient was standing up out of a chair when she lost her footing, fell forward onto her left shoulder and hit the left side of her face.  She denies losing consciousness.  She is no pain with eye movement, no headache, no visual changes.  The patient complains of severe pain in her left shoulder.  She denies any numbness or tingling in her hands.  She denies any pain in the elbow or wrist.  She has no previous dislocations fractures or other injuries to the left shoulder.  Patient has a past medical history of diabetes and hypertension.  She states she takes her medication for hypertension at night and is compliant.  Patient states that she feels her pressures are high today because of her pain. Denies unilateral weakness, facial asymmetry, difficulty with speech, change in gait, or vertigo. Denies DOE, SOB, chest tightness or pressure, radiation to left arm, jaw or back, nausea, or diaphoresis.   No past medical history on file. No past surgical history on file. No family history on file. History  Substance Use Topics  . Smoking status: Not on file  . Smokeless tobacco: Not on file  . Alcohol Use: Not on file   OB History   No data available     Review of Systems  Ten systems reviewed and are negative for acute change, except as noted in the HPI.    Allergies  Review of patient's allergies indicates not on file.  Home Medications   Prior to Admission medications   Not on File   BP 186/94  Pulse 71  Temp(Src) 98.7 F (37.1 C) (Oral)  Resp 16  SpO2 98% Physical Exam Physical  Exam  Nursing note and vitals reviewed. Constitutional: She is oriented to person, place, and time. She appears well-developed and well-nourished. No distress.  HENT:  Head: Normocephalic.  Small hematoma present on the left bowel.  No step-off, crepitus.  Mild tenderness to palpation.  EOMI without pain. Eyes: Conjunctivae normal and EOM are normal. Pupils are equal, round, and reactive to light. No scleral icterus.  Neck: Normal range of motion.  Cardiovascular: Normal rate, regular rhythm and normal heart sounds.  Exam reveals no gallop and no friction rub.   No murmur heard. Pulmonary/Chest: Effort normal and breath sounds normal. No respiratory distress.  Abdominal: Soft. Bowel sounds are normal. She exhibits no distension and no mass. There is no tenderness. There is no guarding.  Neurological: She is alert and oriented to person, place, and time. Musculoskeletal: A left shoulder elbow and wrist exam are performed.  Left shoulder in supportive sling.  She is exquisitely tender to palpation anterior portion of the shoulder.  No overt deformities.  No tenderness to palpation in the left  Epicondyles.  Patient full range of motion of the wrist.  No tenderness to palpation.  Grip strength is full.  2+ radial pulse present.  Skin: Skin is warm and dry. She is not diaphoretic.     ED Course  Procedures (including critical care time) Labs Review Labs Reviewed - No data to display  Imaging  Review Dg Shoulder Left  01/07/2014   CLINICAL DATA:  Pt fell at school this AM. Severe left shoulder pain. Pt unable to perform axillary view.  EXAM: LEFT SHOULDER - 2+ VIEW  COMPARISON:  None.  FINDINGS: There is a fracture of the surgical neck of the left humerus with mild impaction. The glenohumeral joint is intact.  IMPRESSION: Fracture of the proximal left humerus at the surgical neck   Electronically Signed   By: Genevive BiStewart  Edmunds M.D.   On: 01/07/2014 11:21     EKG Interpretation None      MDM    Final diagnoses:  None      10:39 AM Filed Vitals:   01/07/14 1004  BP: 186/94  Pulse: 71  Temp: 98.7 F (37.1 C)  TempSrc: Oral  Resp: 16  SpO2: 98%   Patient systolic blood pressure trending down from 220 down to 190.  I offered her pain medications.  We'll get x-ray of the left shoulder.  Patient does not take any anticoagulants.  She has no neurologic deficits.  11:42 AM Filed Vitals:   01/07/14 1004  BP: 186/94  Pulse: 71  Temp: 98.7 F (37.1 C)  TempSrc: Oral  Resp: 16  SpO2: 98%     Arthor CaptainAbigail Heatherly Stenner, PA-C 01/07/14 1146

## 2014-01-07 NOTE — ED Notes (Signed)
Bed: [id number] Expected date:  Expected time:  Means of arrival:  Comments: Fall, HTN, laceration on face

## 2014-01-07 NOTE — ED Provider Notes (Signed)
Medical screening examination/treatment/procedure(s) were performed by non-physician practitioner and as supervising physician I was immediately available for consultation/collaboration.   EKG Interpretation None        Rolan BuccoMelanie Tristen Pennino, MD 01/07/14 1301

## 2014-03-19 ENCOUNTER — Emergency Department (HOSPITAL_COMMUNITY)
Admission: EM | Admit: 2014-03-19 | Discharge: 2014-03-19 | Disposition: A | Discharge status: Home / Self Care | Payer: BC Managed Care – PPO | Attending: Emergency Medicine | Admitting: Emergency Medicine

## 2014-03-19 ENCOUNTER — Encounter (HOSPITAL_COMMUNITY): Payer: Self-pay | Admitting: Emergency Medicine

## 2014-03-19 DIAGNOSIS — M549 Dorsalgia, unspecified: Secondary | ICD-10-CM | POA: Insufficient documentation

## 2014-03-19 DIAGNOSIS — F17200 Nicotine dependence, unspecified, uncomplicated: Secondary | ICD-10-CM | POA: Insufficient documentation

## 2014-03-19 DIAGNOSIS — Z79899 Other long term (current) drug therapy: Secondary | ICD-10-CM | POA: Insufficient documentation

## 2014-03-19 DIAGNOSIS — E119 Type 2 diabetes mellitus without complications: Secondary | ICD-10-CM | POA: Insufficient documentation

## 2014-03-19 DIAGNOSIS — I10 Essential (primary) hypertension: Secondary | ICD-10-CM | POA: Insufficient documentation

## 2014-03-19 HISTORY — DX: Type 2 diabetes mellitus without complications: E11.9

## 2014-03-19 HISTORY — DX: Essential (primary) hypertension: I10

## 2014-03-19 MED ORDER — OXYCODONE-ACETAMINOPHEN 5-325 MG PO TABS: 1.0000 | ORAL_TABLET | ORAL | Status: DC | PRN

## 2014-03-19 MED ORDER — OXYCODONE-ACETAMINOPHEN 5-325 MG PO TABS
2.0000 | ORAL_TABLET | Freq: Once | ORAL | Status: AC
  Administered 2014-03-19: 2 via ORAL
  Filled 2014-03-19: qty 2

## 2014-03-19 NOTE — ED Provider Notes (Signed)
Medical screening examination/treatment/procedure(s) were performed by non-physician practitioner and as supervising physician I was immediately available for consultation/collaboration.   EKG Interpretation None        Lyanne CoKevin M Ataya Murdy, MD 03/19/14 1059

## 2014-03-19 NOTE — ED Provider Notes (Signed)
CSN: [id number]     Arrival date & time 03/19/14  0721 History   First MD Initiated Contact with Patient 03/19/14 [phone]     Chief Complaint  Patient presents with  . Back Pain    lower      (Consider location/radiation/quality/duration/timing/severity/associated sxs/prior Treatment) The history is provided by the patient and medical records.   This is a 58 y.o. F with PMH significant for HTN and DM, presenting to the ED for back pain, onset yesterday, which she describes as an "ache".  Patient has a history of intermittent back pain, no prior surgeries.  She denies any recent injury, trauma, or falls. No heavy lifting to cause muscular strain. Patient denies any radiation of pain into lower extremities.  No numbness, paresthesias or weakness of extremities.  No loss of bowel or bladder control.  No hx of IVDU or active cancer.  Has been taking tylenol at home without any improvement.  Past Medical History  Diagnosis Date  . Diabetes mellitus without complication   . Hypertension    Past Surgical History  Procedure Laterality Date  . Abdominal hysterectomy     No family history on file. History  Substance Use Topics  . Smoking status: Current Every Day Smoker    Types: Cigarettes  . Smokeless tobacco: Never Used  . Alcohol Use: No     Comment: opccasionally    OB History   Grav Para Term Preterm Abortions TAB SAB Ect Mult Living                 Review of Systems  Musculoskeletal: Positive for back pain.  All other systems reviewed and are negative.     Allergies  Review of patient's allergies indicates no known allergies.  Home Medications   Prior to Admission medications   Medication Sig Start Date End Date Taking? Authorizing Provider  acetaminophen (TYLENOL) 500 MG tablet Take 1,000 mg by mouth every 4 (four) hours as needed for mild pain.   Yes Historical Provider, MD  lisinopril (PRINIVIL,ZESTRIL) 20 MG tablet Take 20 mg by mouth every evening.    Yes  Historical Provider, MD  metFORMIN (GLUCOPHAGE) 500 MG tablet Take 500 mg by mouth every evening.    Yes Historical Provider, MD   BP 174/98  Pulse 60  Temp(Src) 98.4 F (36.9 C) (Oral)  Resp 19  SpO2 100%  Physical Exam  Nursing note and vitals reviewed. Constitutional: She is oriented to person, place, and time. She appears well-developed and well-nourished. No distress.  HENT:  Head: Normocephalic and atraumatic.  Mouth/Throat: Oropharynx is clear and moist.  Eyes: Conjunctivae and EOM are normal. Pupils are equal, round, and reactive to light.  Neck: Normal range of motion. Neck supple.  Cardiovascular: Normal rate, regular rhythm and normal heart sounds.   Pulmonary/Chest: Effort normal and breath sounds normal. No respiratory distress. She has no wheezes.  Musculoskeletal: Normal range of motion.  Right lumbar paraspinal pain without focal tenderness; no midline tenderness, step-off, or deformity; range of motion maintained; negative straight leg raise bilaterally; DP pulses and sensation intact throughout bilateral lower extremities; ambulating unassisted without difficulty  Neurological: She is alert and oriented to person, place, and time.  Skin: Skin is warm. She is not diaphoretic.  Psychiatric: She has a normal mood and affect.    ED Course  Procedures (including critical care time) Labs Review Labs Reviewed - No data to display  Imaging Review No results found.   EKG Interpretation None  MDM   Final diagnoses:  Back pain, unspecified location   58 year old female with atraumatic right lumbar paraspinal pain. No muscle spasm present. No focal neurologic deficits or red flag symptoms regarding her back pain. No history of IV drug abuse or active cancer. Patient given Percocet in the ED with improvement of her pain, will discharge home with the same. FU with PCP.  Discussed plan with patient, he/she acknowledged understanding and agreed with plan of care.   Return precautions given for new or worsening symptoms.  Garlon Hatchet, PA-C 03/19/14 1010

## 2014-03-19 NOTE — Discharge Instructions (Signed)
Take the prescribed medication as directed. °Follow-up with your primary care physician. °Return to the ED for new or worsening symptoms. ° °

## 2014-03-19 NOTE — ED Notes (Signed)
Pt c/o pain that started yesterday. Pt states that she has been taking tylenol for the pain but not relieving. Pt doesn't recall lifting, or injuring to cause the pain. Pt states  that she has been in physical therapy for her fractured shoulder and doesn't know if could be from it.

## 2014-03-29 ENCOUNTER — Emergency Department (HOSPITAL_COMMUNITY): Payer: BC Managed Care – PPO

## 2014-03-29 ENCOUNTER — Encounter (HOSPITAL_COMMUNITY): Payer: Self-pay | Admitting: Emergency Medicine

## 2014-03-29 ENCOUNTER — Emergency Department (HOSPITAL_COMMUNITY)
Admission: EM | Admit: 2014-03-29 | Discharge: 2014-03-29 | Disposition: A | Discharge status: Home / Self Care | Payer: BC Managed Care – PPO | Attending: Emergency Medicine | Admitting: Emergency Medicine

## 2014-03-29 DIAGNOSIS — M545 Low back pain, unspecified: Secondary | ICD-10-CM

## 2014-03-29 DIAGNOSIS — M4310 Spondylolisthesis, site unspecified: Secondary | ICD-10-CM

## 2014-03-29 DIAGNOSIS — Z79899 Other long term (current) drug therapy: Secondary | ICD-10-CM | POA: Insufficient documentation

## 2014-03-29 DIAGNOSIS — E119 Type 2 diabetes mellitus without complications: Secondary | ICD-10-CM | POA: Insufficient documentation

## 2014-03-29 DIAGNOSIS — I10 Essential (primary) hypertension: Secondary | ICD-10-CM | POA: Insufficient documentation

## 2014-03-29 DIAGNOSIS — F17200 Nicotine dependence, unspecified, uncomplicated: Secondary | ICD-10-CM | POA: Insufficient documentation

## 2014-03-29 HISTORY — DX: Dorsalgia, unspecified: M54.9

## 2014-03-29 MED ORDER — CYCLOBENZAPRINE HCL 10 MG PO TABS: 10.0000 mg | ORAL_TABLET | Freq: Three times a day (TID) | ORAL | Status: DC | PRN

## 2014-03-29 MED ORDER — CYCLOBENZAPRINE HCL 10 MG PO TABS
5.0000 mg | ORAL_TABLET | Freq: Once | ORAL | Status: AC
  Administered 2014-03-29: 5 mg via ORAL
  Filled 2014-03-29: qty 1

## 2014-03-29 MED ORDER — MELOXICAM 15 MG PO TABS: 15.0000 mg | ORAL_TABLET | Freq: Every day | ORAL | Status: DC

## 2014-03-29 MED ORDER — HYDROMORPHONE HCL PF 1 MG/ML IJ SOLN
1.0000 mg | Freq: Once | INTRAMUSCULAR | Status: AC
  Administered 2014-03-29: 1 mg via INTRAMUSCULAR
  Filled 2014-03-29: qty 1

## 2014-03-29 MED ORDER — DEXAMETHASONE SODIUM PHOSPHATE 10 MG/ML IJ SOLN
10.0000 mg | Freq: Once | INTRAMUSCULAR | Status: AC
  Administered 2014-03-29: 10 mg via INTRAMUSCULAR
  Filled 2014-03-29: qty 1

## 2014-03-29 NOTE — ED Provider Notes (Signed)
CSN: [id number]     Arrival date & time 03/29/14  0206 History   First MD Initiated Contact with Patient 03/29/14 0303     Chief Complaint  Patient presents with  . Back Pain   HPI  History and the patient. Patient is a 58 year old female with history of hypertension and diabetes who presents with complaints of continued and worsened right lower back pain. Does have been present for the past 2 weeks. Pain has been constant. It does not radiate. She denies any initial injuries or trauma is. She was seen previously in emergency room was given pain medications with partial relief of symptoms. Discontinued and she followed up with her PCP who she states gave her some "shots" in the office which also helps him at the time. She has continued to use hydrocodone with only partial relief of her pain. She also began taking MiraLax for any possible constipation without any change. She denies having any associated fever, chills or sweats. Denies any weakness or numbness in the lower extremities. Denies any urinary or fecal incontinence, urinary retention or perineal numbness.   Past Medical History  Diagnosis Date  . Diabetes mellitus without complication   . Hypertension   . Back pain    Past Surgical History  Procedure Laterality Date  . Abdominal hysterectomy     No family history on file. History  Substance Use Topics  . Smoking status: Current Every Day Smoker    Types: Cigarettes  . Smokeless tobacco: Never Used  . Alcohol Use: No     Comment: opccasionally    OB History   Grav Para Term Preterm Abortions TAB SAB Ect Mult Living                 Review of Systems  Constitutional: Negative for fever, chills, diaphoresis and unexpected weight change.  Gastrointestinal: Negative for nausea, vomiting and abdominal pain.  Genitourinary: Negative for dysuria, frequency, hematuria and flank pain.  Musculoskeletal: Positive for back pain.  Neurological: Negative for weakness and numbness.   All other systems reviewed and are negative.     Allergies  Review of patient's allergies indicates no known allergies.  Home Medications   Prior to Admission medications   Medication Sig Start Date End Date Taking? Authorizing Provider  acetaminophen (TYLENOL) 500 MG tablet Take 1,000 mg by mouth every 4 (four) hours as needed for mild pain.   Yes Historical Provider, MD  docusate sodium (COLACE) 50 MG capsule Take 100 mg by mouth 2 (two) times daily.   Yes Historical Provider, MD  HYDROcodone-acetaminophen (NORCO/VICODIN) 5-325 MG per tablet Take 1-2 tablets by mouth every 6 (six) hours as needed for moderate pain (pain).   Yes Historical Provider, MD  lisinopril (PRINIVIL,ZESTRIL) 20 MG tablet Take 20 mg by mouth every evening.    Yes Historical Provider, MD  metFORMIN (GLUCOPHAGE) 500 MG tablet Take 500 mg by mouth every evening.    Yes Historical Provider, MD  polyethylene glycol (MIRALAX / GLYCOLAX) packet Take 17 g by mouth daily as needed (constipation).   Yes Historical Provider, MD  oxyCODONE-acetaminophen (PERCOCET/ROXICET) 5-325 MG per tablet Take 1 tablet by mouth every 4 (four) hours as needed. 03/19/14   Garlon HatchetLisa M Sanders, PA-C   BP 188/105  Pulse 71  Temp(Src) 99.3 F (37.4 C) (Oral)  Resp 16  SpO2 100% Physical Exam  Nursing note and vitals reviewed. Constitutional: She is oriented to person, place, and time. She appears well-developed and well-nourished. No distress.  HENT:  Head: Normocephalic.  Cardiovascular: Normal rate and regular rhythm.   Pulmonary/Chest: Effort normal and breath sounds normal. No respiratory distress.  Abdominal: Soft. There is no tenderness.  Musculoskeletal: Normal range of motion. She exhibits no edema and no tenderness.       Thoracic back: Normal.       Lumbar back: Normal. She exhibits no tenderness and no deformity.  Neurological: She is alert and oriented to person, place, and time. She has normal strength. No sensory deficit.   Negative straight leg test. Normal strength and sensations in lower legs.  Skin: Skin is warm and dry. No rash noted.  Psychiatric: She has a normal mood and affect. Her behavior is normal.    ED Course  Procedures   COORDINATION OF CARE:  Nursing notes reviewed. Vital signs reviewed. Initial pt interview and examination performed.   Filed Vitals:   03/29/14 0207  BP: 188/105  Pulse: 71  Temp: 99.3 F (37.4 C)  TempSrc: Oral  Resp: 16  SpO2: 100%    3:35 AM-patient seen and evaluated. Patient appears uncomfortable in the bed. No significant pain or tenderness along the lumbar spine or paraspinous areas. No deformities.   x-rays reviewed. No concerning or emergent findings. Patient does have improvement in pain after medications. Distention he be discharged home. She will followup with PCP later today.    Treatment plan initiated: Medications  HYDROmorphone (DILAUDID) injection 1 mg (not administered)  cyclobenzaprine (FLEXERIL) tablet 5 mg (not administered)  dexamethasone (DECADRON) injection 10 mg (not administered)      Imaging Review Dg Lumbar Spine Complete  03/29/2014   CLINICAL DATA:  Lower back pain, radiating down the right leg.  EXAM: LUMBAR SPINE - COMPLETE 4+ VIEW  COMPARISON:  None.  FINDINGS: There is no evidence of acute fracture or subluxation. Vertebral bodies demonstrate normal height. There is mild grade 1 anterolisthesis of L4 on L5, likely reflecting facet disease. Intervertebral disc spaces are preserved.  The visualized bowel gas pattern is unremarkable in appearance; air and stool are noted within the colon. The sacroiliac joints are within normal limits.  IMPRESSION: 1. No evidence of acute fracture or subluxation along the lumbar spine. 2. Mild grade 1 anterolisthesis of L4 on L5, likely reflecting facet disease.   Electronically Signed   By: Roanna Raider M.D.   On: 03/29/2014 04:15     MDM   Final diagnoses:  Right-sided low back pain  without sciatica  Anterolisthesis        Angus Seller, PA-C 03/29/14 [phone]

## 2014-03-29 NOTE — Discharge Instructions (Signed)
You were treated for your low back pain. At this time your providers not feel your symptoms are caused by any emergent condition. Please followup with your primary care provider for continued evaluation and treatment.   Back Pain, Adult Back pain is very common. The pain often gets better over time. The cause of back pain is usually not dangerous. Most people can learn to manage their back pain on their own.  HOME CARE   Stay active. Start with short walks on flat ground if you can. Try to walk farther each day.  Do not sit, drive, or stand in one place for more than 30 minutes. Do not stay in bed.  Do not avoid exercise or work. Activity can help your back heal faster.  Be careful when you bend or lift an object. Bend at your knees, keep the object close to you, and do not twist.  Sleep on a firm mattress. Lie on your side, and bend your knees. If you lie on your back, put a pillow under your knees.  Only take medicines as told by your doctor.  Put ice on the injured area.  Put ice in a plastic bag.  Place a towel between your skin and the bag.  Leave the ice on for 15-20 minutes, 03-04 times a day for the first 2 to 3 days. After that, you can switch between ice and heat packs.  Ask your doctor about back exercises or massage.  Avoid feeling anxious or stressed. Find good ways to deal with stress, such as exercise. GET HELP RIGHT AWAY IF:   Your pain does not go away with rest or medicine.  Your pain does not go away in 1 week.  You have new problems.  You do not feel well.  The pain spreads into your legs.  You cannot control when you poop (bowel movement) or pee (urinate).  Your arms or legs feel weak or lose feeling (numbness).  You feel sick to your stomach (nauseous) or throw up (vomit).  You have belly (abdominal) pain.  You feel like you may pass out (faint). MAKE SURE YOU:   Understand these instructions.  Will watch your condition.  Will get help  right away if you are not doing well or get worse. Document Released: 02/13/2008 Document Revised: 11/19/2011 Document Reviewed: 01/15/2011 [facility] Patient Information 2015 [place]. This information is not intended to replace advice given to you by your health care provider. Make sure you discuss any questions you have with your health care provider.    Back Exercises Back exercises help treat and prevent back injuries. The goal of back exercises is to increase the strength of your abdominal and back muscles and the flexibility of your back. These exercises should be started when you no longer have back pain. Back exercises include:  Pelvic Tilt. Lie on your back with your knees bent. Tilt your pelvis until the lower part of your back is against the floor. Hold this position 5 to 10 sec and repeat 5 to 10 times.  Knee to Chest. Pull first 1 knee up against your chest and hold for 20 to 30 seconds, repeat this with the other knee, and then both knees. This may be done with the other leg straight or bent, whichever feels better.  Sit-Ups or Curl-Ups. Bend your knees 90 degrees. Start with tilting your pelvis, and do a partial, slow sit-up, lifting your trunk only 30 to 45 degrees off the floor. Take at least  2 to 3 seconds for each sit-up. Do not do sit-ups with your knees out straight. If partial sit-ups are difficult, simply do the above but with only tightening your abdominal muscles and holding it as directed.  Hip-Lift. Lie on your back with your knees flexed 90 degrees. Push down with your feet and shoulders as you raise your hips a couple inches off the floor; hold for 10 seconds, repeat 5 to 10 times.  Back arches. Lie on your stomach, propping yourself up on bent elbows. Slowly press on your hands, causing an arch in your low back. Repeat 3 to 5 times. Any initial stiffness and discomfort should lessen with repetition over time.  Shoulder-Lifts. Lie face down with arms beside your  body. Keep hips and torso pressed to floor as you slowly lift your head and shoulders off the floor. Do not overdo your exercises, especially in the beginning. Exercises may cause you some mild back discomfort which lasts for a few minutes; however, if the pain is more severe, or lasts for more than 15 minutes, do not continue exercises until you see your caregiver. Improvement with exercise therapy for back problems is slow.  See your caregivers for assistance with developing a proper back exercise program. Document Released: 10/04/2004 Document Revised: 11/19/2011 Document Reviewed: 06/28/2011 [facility] Patient Information 2015 [place], [place]. This information is not intended to replace advice given to you by your health care provider. Make sure you discuss any questions you have with your health care provider.

## 2014-03-29 NOTE — ED Notes (Signed)
Pt arrives by EMS with complaints of back pain for the past 2 weeks, states has been seen here for the same complaint with no relief of symptoms.  Pt states she has seen her primary care for the same complaint and got "2 shots I had to sign for"-

## 2014-04-01 NOTE — ED Provider Notes (Signed)
Medical screening examination/treatment/procedure(s) were performed by non-physician practitioner and as supervising physician I was immediately available for consultation/collaboration.   Candyce ChurnJohn David Kainalu Heggs III, MD 04/01/14 [phone]

## 2014-05-11 ENCOUNTER — Other Ambulatory Visit (HOSPITAL_COMMUNITY): Payer: Self-pay | Admitting: Internal Medicine

## 2014-05-11 DIAGNOSIS — Z1231 Encounter for screening mammogram for malignant neoplasm of breast: Secondary | ICD-10-CM

## 2014-05-21 ENCOUNTER — Ambulatory Visit (HOSPITAL_COMMUNITY): Payer: BC Managed Care – PPO | Attending: Internal Medicine

## 2014-06-04 ENCOUNTER — Ambulatory Visit (HOSPITAL_COMMUNITY): Payer: BC Managed Care – PPO | Attending: Internal Medicine

## 2014-06-15 ENCOUNTER — Ambulatory Visit (HOSPITAL_COMMUNITY)
Admission: RE | Admit: 2014-06-15 | Discharge: 2014-06-15 | Disposition: A | Discharge status: Home / Self Care | Payer: BC Managed Care – PPO | Source: Ambulatory Visit | Attending: Internal Medicine | Admitting: Internal Medicine

## 2014-06-15 DIAGNOSIS — Z1231 Encounter for screening mammogram for malignant neoplasm of breast: Secondary | ICD-10-CM | POA: Diagnosis present

## 2014-12-17 ENCOUNTER — Telehealth: Payer: Self-pay | Admitting: Hematology
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
## 2015-01-18 ENCOUNTER — Ambulatory Visit: Payer: Self-pay

## 2015-01-18 ENCOUNTER — Ambulatory Visit: Payer: Self-pay | Admitting: Hematology

## 2015-03-11 ENCOUNTER — Other Ambulatory Visit: Payer: Self-pay | Admitting: Nurse Practitioner

## 2015-03-11 ENCOUNTER — Ambulatory Visit
Admission: RE | Admit: 2015-03-11 | Discharge: 2015-03-11 | Disposition: A | Discharge status: Home / Self Care | Payer: BC Managed Care – PPO | Source: Ambulatory Visit | Attending: Internal Medicine | Admitting: Internal Medicine

## 2015-03-11 DIAGNOSIS — M79602 Pain in left arm: Secondary | ICD-10-CM

## 2015-06-06 ENCOUNTER — Emergency Department (HOSPITAL_COMMUNITY)
Admission: EM | Admit: 2015-06-06 | Discharge: 2015-06-06 | Disposition: A | Discharge status: Home / Self Care | Payer: BC Managed Care – PPO | Attending: Emergency Medicine | Admitting: Emergency Medicine

## 2015-06-06 ENCOUNTER — Encounter (HOSPITAL_COMMUNITY): Payer: Self-pay | Admitting: Cardiology

## 2015-06-06 DIAGNOSIS — E119 Type 2 diabetes mellitus without complications: Secondary | ICD-10-CM | POA: Diagnosis not present

## 2015-06-06 DIAGNOSIS — Z79899 Other long term (current) drug therapy: Secondary | ICD-10-CM | POA: Insufficient documentation

## 2015-06-06 DIAGNOSIS — Y998 Other external cause status: Secondary | ICD-10-CM | POA: Insufficient documentation

## 2015-06-06 DIAGNOSIS — Y92410 Unspecified street and highway as the place of occurrence of the external cause: Secondary | ICD-10-CM | POA: Diagnosis not present

## 2015-06-06 DIAGNOSIS — L0231 Cutaneous abscess of buttock: Secondary | ICD-10-CM | POA: Insufficient documentation

## 2015-06-06 DIAGNOSIS — Y9389 Activity, other specified: Secondary | ICD-10-CM | POA: Diagnosis not present

## 2015-06-06 DIAGNOSIS — I10 Essential (primary) hypertension: Secondary | ICD-10-CM | POA: Diagnosis not present

## 2015-06-06 DIAGNOSIS — Z720 Tobacco use: Secondary | ICD-10-CM | POA: Insufficient documentation

## 2015-06-06 DIAGNOSIS — Z041 Encounter for examination and observation following transport accident: Secondary | ICD-10-CM | POA: Insufficient documentation

## 2015-06-06 MED ORDER — LIDOCAINE-EPINEPHRINE (PF) 2 %-1:200000 IJ SOLN
10.0000 mL | Freq: Once | INTRAMUSCULAR | Status: AC
  Administered 2015-06-06: 10 mL via INTRADERMAL
  Filled 2015-06-06: qty 20

## 2015-06-06 MED ORDER — SULFAMETHOXAZOLE-TRIMETHOPRIM 800-160 MG PO TABS: 1.0000 | ORAL_TABLET | Freq: Two times a day (BID) | ORAL | Status: AC
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
## 2015-07-06 ENCOUNTER — Other Ambulatory Visit: Payer: Self-pay | Admitting: Internal Medicine

## 2015-07-06 DIAGNOSIS — N63 Unspecified lump in unspecified breast: Secondary | ICD-10-CM

## 2015-07-06 DIAGNOSIS — N611 Abscess of the breast and nipple: Secondary | ICD-10-CM

## 2015-07-07 ENCOUNTER — Other Ambulatory Visit: Payer: Self-pay

## 2015-07-19 ENCOUNTER — Ambulatory Visit
Admission: RE | Admit: 2015-07-19 | Discharge: 2015-07-19 | Disposition: A | Discharge status: Home / Self Care | Payer: BC Managed Care – PPO | Source: Ambulatory Visit | Attending: Internal Medicine | Admitting: Internal Medicine

## 2015-07-19 DIAGNOSIS — N63 Unspecified lump in unspecified breast: Secondary | ICD-10-CM

## 2015-07-19 DIAGNOSIS — N611 Abscess of the breast and nipple: Secondary | ICD-10-CM

## 2015-11-09 ENCOUNTER — Other Ambulatory Visit: Payer: Self-pay | Admitting: Internal Medicine

## 2015-11-09 DIAGNOSIS — N6310 Unspecified lump in the right breast, unspecified quadrant: Secondary | ICD-10-CM

## 2015-11-10 ENCOUNTER — Ambulatory Visit
Admission: RE | Admit: 2015-11-10 | Discharge: 2015-11-10 | Disposition: A | Discharge status: Home / Self Care | Payer: BC Managed Care – PPO | Source: Ambulatory Visit | Attending: Internal Medicine | Admitting: Internal Medicine

## 2015-11-10 DIAGNOSIS — N6310 Unspecified lump in the right breast, unspecified quadrant: Secondary | ICD-10-CM

## 2015-12-12 ENCOUNTER — Encounter (HOSPITAL_COMMUNITY): Payer: Self-pay | Admitting: Emergency Medicine

## 2015-12-12 ENCOUNTER — Emergency Department (INDEPENDENT_AMBULATORY_CARE_PROVIDER_SITE_OTHER)
Admission: EM | Admit: 2015-12-12 | Discharge: 2015-12-12 | Disposition: A | Discharge status: Home / Self Care | Payer: BC Managed Care – PPO | Source: Home / Self Care | Attending: Family Medicine | Admitting: Family Medicine

## 2015-12-12 DIAGNOSIS — R600 Localized edema: Secondary | ICD-10-CM

## 2015-12-12 DIAGNOSIS — I10 Essential (primary) hypertension: Secondary | ICD-10-CM

## 2015-12-12 MED ORDER — FUROSEMIDE 20 MG PO TABS: 20.0000 mg | ORAL_TABLET | Freq: Two times a day (BID) | ORAL | Status: DC

## 2015-12-12 NOTE — ED Notes (Signed)
Intermittent lower extremty edema for one week.  Swelling increases throughout the day and decreases at night.  Patient had been off medicines for a month, patient reports she has been taking them regularly for a month.  Patient reports she is only taking 2 of the 3 types of blood pressure medicines she normally takes, cannot remember what it was

## 2015-12-12 NOTE — ED Provider Notes (Signed)
CSN: [id number]     Arrival date & time 12/12/15  1736 History   First MD Initiated Contact with Patient 12/12/15 1840     No chief complaint on file.  (Consider location/radiation/quality/duration/timing/severity/associated sxs/prior Treatment) HPI Comments: Bilateral lower extremity edema for 2 weeks which is worsening.  She was off BP medications and has been placed back on them by her PCP.  Patient is a 60 y.o. female presenting with leg pain.  Leg Pain Location:  Leg Time since incident:  2 weeks Leg location:  R lower leg and L lower leg Pain details:    Quality:  Aching   Radiates to:  Does not radiate   Severity:  Moderate   Onset quality:  Gradual   Duration:  2 weeks   Timing:  Constant   Progression:  Worsening Chronicity:  New Dislocation: no   Prior injury to area:  No Relieved by:  Elevation Worsened by:  Bearing weight Ineffective treatments:  None tried Associated symptoms: swelling     Past Medical History  Diagnosis Date  . Diabetes mellitus without complication   . Hypertension   . Back pain    Past Surgical History  Procedure Laterality Date  . Abdominal hysterectomy     No family history on file. Social History  Substance Use Topics  . Smoking status: Current Every Day Smoker    Types: Cigarettes  . Smokeless tobacco: Never Used  . Alcohol Use: No     Comment: opccasionally    OB History    No data available     Review of Systems  Constitutional: Negative.   HENT: Negative.   Eyes: Negative.   Respiratory: Negative.   Cardiovascular: Positive for leg swelling.  Gastrointestinal: Negative.   Musculoskeletal: Negative.   Skin: Negative.   Hematological: Negative.   Psychiatric/Behavioral: Negative.     Allergies  Review of patient's allergies indicates no known allergies.  Home Medications   Prior to Admission medications   Medication Sig Start Date End Date Taking? Authorizing Provider  acetaminophen (TYLENOL) 500 MG tablet  Take 1,000 mg by mouth every 4 (four) hours as needed for mild pain.    Historical Provider, MD  cyclobenzaprine (FLEXERIL) 10 MG tablet Take 1 tablet (10 mg total) by mouth 3 (three) times daily as needed for muscle spasms. 03/29/14   Ivonne Andrew, PA-C  docusate sodium (COLACE) 50 MG capsule Take 100 mg by mouth 2 (two) times daily.    Historical Provider, MD  HYDROcodone-acetaminophen (NORCO/VICODIN) 5-325 MG per tablet Take 1-2 tablets by mouth every 6 (six) hours as needed for moderate pain (pain).    Historical Provider, MD  lisinopril (PRINIVIL,ZESTRIL) 20 MG tablet Take 20 mg by mouth every evening.     Historical Provider, MD  meloxicam (MOBIC) 15 MG tablet Take 1 tablet (15 mg total) by mouth daily. 03/29/14   Ivonne Andrew, PA-C  metFORMIN (GLUCOPHAGE) 500 MG tablet Take 500 mg by mouth every evening.     Historical Provider, MD  oxyCODONE-acetaminophen (PERCOCET/ROXICET) 5-325 MG per tablet Take 1 tablet by mouth every 4 (four) hours as needed. 03/19/14   Garlon Hatchet, PA-C  polyethylene glycol [facility] / Ethelene Hal) packet Take 17 g by mouth daily as needed (constipation).    Historical Provider, MD   Meds Ordered and Administered this Visit  Medications - No data to display  BP 168/79 mmHg  Pulse 79  Temp(Src) 98.1 F (36.7 C) (Oral)  Resp 16  SpO2 100% No data  found.   Physical Exam  Constitutional: She is oriented to person, place, and time. She appears well-developed and well-nourished.  HENT:  Head: Normocephalic and atraumatic.  Right Ear: External ear normal.  Left Ear: External ear normal.  Mouth/Throat: Oropharynx is clear and moist.  Eyes: Conjunctivae and EOM are normal. Pupils are equal, round, and reactive to light.  Neck: Normal range of motion. Neck supple.  Cardiovascular: Normal rate, regular rhythm and normal heart sounds.   Pre tibial edema 2 plus bilateral.  Pedal edema 2 plus bilateral.  Negative Homans  Pulmonary/Chest: Effort normal and breath sounds  normal.  Abdominal: Soft. Bowel sounds are normal.  Neurological: She is alert and oriented to person, place, and time.  Skin: There is erythema.  Bilateral LE's with tight skin and erythematous pretibial region.  Pitting edema bilateral pretibial and pedal regions bilateral.    ED Course  Procedures (including critical care time)  Labs Review Labs Reviewed - No data to display  Imaging Review No results found.   Visual Acuity Review  Right Eye Distance:   Left Eye Distance:   Bilateral Distance:    Right Eye Near:   Left Eye Near:    Bilateral Near:         MDM  Bilateral Lower Extremity Edema - Lasix 20mg  po bid x 3 days then stop.  Advised to eat bannana qd Advised to follow up with PCP.  Advised to wear compression stockings or at least tight panty hose QD.  Informed patient it could be bp meds but stay on them because she is HTN and not controlled.  HTN - Advised to follow up with PCP.  Advised to stay on bp meds.     Deatra CanterWilliam J Oxford, FNP 12/12/15 1910

## 2015-12-12 NOTE — Discharge Instructions (Signed)
Edema °Edema is an abnormal buildup of fluids. It is more common in your legs and thighs. Painless swelling of the feet and ankles is more likely as a person ages. It also is common in looser skin, like around your eyes. °HOME CARE  °· Keep the affected body part above the level of the heart while lying down. °· Do not sit still or stand for a long time. °· Do not put anything right under your knees when you lie down. °· Do not wear tight clothes on your upper legs. °· Exercise your legs to help the puffiness (swelling) go down. °· Wear elastic bandages or support stockings as told by your doctor. °· A low-salt diet may help lessen the puffiness. °· Only take medicine as told by your doctor. °GET HELP IF: °· Treatment is not working. °· You have heart, liver, or kidney disease and notice that your skin looks puffy or shiny. °· You have puffiness in your legs that does not get better when you raise your legs. °· You have sudden weight gain for no reason. °GET HELP RIGHT AWAY IF:  °· You have shortness of breath or chest pain. °· You cannot breathe when you lie down. °· You have pain, redness, or warmth in the areas that are puffy. °· You have heart, liver, or kidney disease and get edema all of a sudden. °· You have a fever and your symptoms get worse all of a sudden. °MAKE SURE YOU:  °· Understand these instructions. °· Will watch your condition. °· Will get help right away if you are not doing well or get worse. °  °This information is not intended to replace advice given to you by your health care provider. Make sure you discuss any questions you have with your health care provider. °  °Document Released: 02/13/2008 Document Revised: 09/01/2013 Document Reviewed: 06/19/2013 °Elsevier Interactive Patient Education ©2016 [employer] ° °

## 2016-06-02 ENCOUNTER — Encounter (HOSPITAL_COMMUNITY): Payer: Self-pay

## 2016-06-02 ENCOUNTER — Emergency Department (HOSPITAL_COMMUNITY)
Admission: EM | Admit: 2016-06-02 | Discharge: 2016-06-02 | Disposition: A | Discharge status: Home / Self Care | Payer: BC Managed Care – PPO | Attending: Emergency Medicine | Admitting: Emergency Medicine

## 2016-06-02 ENCOUNTER — Emergency Department (HOSPITAL_COMMUNITY): Payer: BC Managed Care – PPO

## 2016-06-02 DIAGNOSIS — F17210 Nicotine dependence, cigarettes, uncomplicated: Secondary | ICD-10-CM | POA: Insufficient documentation

## 2016-06-02 DIAGNOSIS — Z794 Long term (current) use of insulin: Secondary | ICD-10-CM | POA: Diagnosis not present

## 2016-06-02 DIAGNOSIS — L03115 Cellulitis of right lower limb: Secondary | ICD-10-CM

## 2016-06-02 DIAGNOSIS — Y939 Activity, unspecified: Secondary | ICD-10-CM | POA: Insufficient documentation

## 2016-06-02 DIAGNOSIS — Z79899 Other long term (current) drug therapy: Secondary | ICD-10-CM | POA: Diagnosis not present

## 2016-06-02 DIAGNOSIS — S82201A Unspecified fracture of shaft of right tibia, initial encounter for closed fracture: Secondary | ICD-10-CM | POA: Insufficient documentation

## 2016-06-02 DIAGNOSIS — Y9289 Other specified places as the place of occurrence of the external cause: Secondary | ICD-10-CM | POA: Diagnosis not present

## 2016-06-02 DIAGNOSIS — X58XXXA Exposure to other specified factors, initial encounter: Secondary | ICD-10-CM | POA: Insufficient documentation

## 2016-06-02 DIAGNOSIS — Y990 Civilian activity done for income or pay: Secondary | ICD-10-CM | POA: Insufficient documentation

## 2016-06-02 DIAGNOSIS — S82401A Unspecified fracture of shaft of right fibula, initial encounter for closed fracture: Secondary | ICD-10-CM | POA: Insufficient documentation

## 2016-06-02 DIAGNOSIS — I10 Essential (primary) hypertension: Secondary | ICD-10-CM | POA: Diagnosis not present

## 2016-06-02 DIAGNOSIS — Z7984 Long term (current) use of oral hypoglycemic drugs: Secondary | ICD-10-CM | POA: Insufficient documentation

## 2016-06-02 DIAGNOSIS — S99911A Unspecified injury of right ankle, initial encounter: Secondary | ICD-10-CM | POA: Diagnosis present

## 2016-06-02 DIAGNOSIS — E119 Type 2 diabetes mellitus without complications: Secondary | ICD-10-CM | POA: Insufficient documentation

## 2016-06-02 LAB — CBC WITH DIFFERENTIAL/PLATELET
Basophils Absolute: 0 10*3/uL (ref 0.0–0.1)
Basophils Relative: 0 %
EOS ABS: 0.3 10*3/uL (ref 0.0–0.7)
EOS PCT: 2 %
HCT: 38.1 % (ref 36.0–46.0)
Hemoglobin: 11.9 g/dL — ABNORMAL LOW (ref 12.0–15.0)
LYMPHS ABS: 1.8 10*3/uL (ref 0.7–4.0)
Lymphocytes Relative: 12 %
MCH: 23.2 pg — ABNORMAL LOW (ref 26.0–34.0)
MCHC: 31.2 g/dL (ref 30.0–36.0)
MCV: 74.4 fL — AB (ref 78.0–100.0)
MONO ABS: 1.1 10*3/uL — AB (ref 0.1–1.0)
Monocytes Relative: 7 %
NEUTROS PCT: 79 %
Neutro Abs: 11.9 10*3/uL — ABNORMAL HIGH (ref 1.7–7.7)
PLATELETS: 220 10*3/uL (ref 150–400)
RBC: 5.12 MIL/uL — AB (ref 3.87–5.11)
RDW: 20.3 % — AB (ref 11.5–15.5)
WBC: 15.1 10*3/uL — AB (ref 4.0–10.5)

## 2016-06-02 LAB — COMPREHENSIVE METABOLIC PANEL
ALK PHOS: 138 U/L — AB (ref 38–126)
ALT: 15 U/L (ref 14–54)
ANION GAP: 10 (ref 5–15)
AST: 13 U/L — ABNORMAL LOW (ref 15–41)
Albumin: 3.1 g/dL — ABNORMAL LOW (ref 3.5–5.0)
BILIRUBIN TOTAL: 0.7 mg/dL (ref 0.3–1.2)
BUN: 17 mg/dL (ref 6–20)
CALCIUM: 9 mg/dL (ref 8.9–10.3)
CO2: 21 mmol/L — ABNORMAL LOW (ref 22–32)
CREATININE: 0.94 mg/dL (ref 0.44–1.00)
Chloride: 109 mmol/L (ref 101–111)
Glucose, Bld: 164 mg/dL — ABNORMAL HIGH (ref 65–99)
Potassium: 3.9 mmol/L (ref 3.5–5.1)
SODIUM: 140 mmol/L (ref 135–145)
TOTAL PROTEIN: 7.6 g/dL (ref 6.5–8.1)

## 2016-06-02 LAB — I-STAT CG4 LACTIC ACID, ED: LACTIC ACID, VENOUS: 0.98 mmol/L (ref 0.5–1.9)

## 2016-06-02 MED ORDER — HYDROCODONE-ACETAMINOPHEN 7.5-325 MG/15ML PO SOLN
10.0000 mL | Freq: Once | ORAL | Status: AC
  Administered 2016-06-02: 10 mL via ORAL
  Filled 2016-06-02: qty 15

## 2016-06-02 MED ORDER — CEPHALEXIN 500 MG PO CAPS: 500.0000 mg | ORAL_CAPSULE | Freq: Four times a day (QID) | ORAL | 0 refills | Status: AC

## 2016-06-02 MED ORDER — HYDROCODONE-ACETAMINOPHEN 7.5-325 MG/15ML PO SOLN: 10.0000 mL | Freq: Four times a day (QID) | ORAL | 0 refills | Status: DC | PRN

## 2016-06-02 NOTE — ED Notes (Signed)
Ortho returned page  

## 2016-06-02 NOTE — ED Provider Notes (Signed)
[facility]-EMERGENCY DEPT Provider Note   CSN: [id number] Arrival date & time: 06/02/16  1539     History   Chief Complaint Chief Complaint  Patient presents with  . ankle redness, swelling    HPI [name] is a 60 y.o. female.   Ankle Pain   The incident occurred more than 2 days ago. The incident occurred at work. There was no injury mechanism. The pain is present in the right ankle. The quality of the pain is described as sharp and throbbing. The pain is moderate. The pain has been constant since onset. Associated symptoms include inability to bear weight (2/2 pain). Pertinent negatives include no loss of motion. The symptoms are aggravated by activity. She has tried rest and NSAIDs for the symptoms. The treatment provided mild relief.    Past Medical History:  Diagnosis Date  . Back pain   . Diabetes mellitus without complication (HCC)   . Hypertension     There are no active problems to display for this patient.   Past Surgical History:  Procedure Laterality Date  . ABDOMINAL HYSTERECTOMY      OB History    No data available       Home Medications    Prior to Admission medications   Medication Sig Start Date End Date Taking? Authorizing Provider  acetaminophen (TYLENOL) 500 MG tablet Take 1,000 mg by mouth every 4 (four) hours as needed for mild pain.    Historical Provider, MD  amLODipine (NORVASC) 5 MG tablet Take 5 mg by mouth daily.    Historical Provider, MD  cyclobenzaprine (FLEXERIL) 10 MG tablet Take 1 tablet (10 mg total) by mouth 3 (three) times daily as needed for muscle spasms. 03/29/14   Ivonne Andrew, PA-C  docusate sodium (COLACE) 50 MG capsule Take 100 mg by mouth 2 (two) times daily.    Historical Provider, MD  furosemide (LASIX) 20 MG tablet Take 1 tablet (20 mg total) by mouth 2 (two) times daily. 12/12/15   Deatra Canter, FNP  HYDROcodone-acetaminophen (NORCO/VICODIN) 5-325 MG per tablet Take 1-2 tablets by mouth every 6 (six) hours as  needed for moderate pain (pain).    Historical Provider, MD  Insulin Glargine (TOUJEO SOLOSTAR [place]) Inject into the skin.    Historical Provider, MD  lisinopril (PRINIVIL,ZESTRIL) 20 MG tablet Take 20 mg by mouth every evening.     Historical Provider, MD  meloxicam (MOBIC) 15 MG tablet Take 1 tablet (15 mg total) by mouth daily. 03/29/14   Ivonne Andrew, PA-C  metFORMIN (GLUCOPHAGE) 500 MG tablet Take 500 mg by mouth every evening.     Historical Provider, MD  oxyCODONE-acetaminophen (PERCOCET/ROXICET) 5-325 MG per tablet Take 1 tablet by mouth every 4 (four) hours as needed. 03/19/14   Garlon Hatchet, PA-C  polyethylene glycol [facility] / Ethelene Hal) packet Take 17 g by mouth daily as needed (constipation).    Historical Provider, MD  valsartan (DIOVAN) 160 MG tablet Take 160 mg by mouth daily.    Historical Provider, MD    Family History No family history on file.  Social History Social History  Substance Use Topics  . Smoking status: Current Every Day Smoker    Types: Cigarettes  . Smokeless tobacco: Never Used  . Alcohol use No     Comment: opccasionally      Allergies   Review of patient's allergies indicates no known allergies.   Review of Systems Review of Systems  Constitutional: Negative for chills and fever.  HENT: Negative for ear pain and sore throat.   Eyes: Negative for pain and visual disturbance.  Respiratory: Negative for cough and shortness of breath.   Cardiovascular: Negative for chest pain and palpitations.  Gastrointestinal: Negative for abdominal pain and vomiting.  Genitourinary: Negative for dysuria and hematuria.  Musculoskeletal: Positive for joint swelling. Negative for arthralgias and back pain.  Skin: Positive for color change (red, indurated skin over right ankle). Negative for rash.  Neurological: Negative for seizures and syncope.  All other systems reviewed and are negative.    Physical Exam Updated Vital Signs BP 155/80 (BP Location: Right  Arm)   Pulse 66   Temp 99.1 F (37.3 C) (Oral)   Resp 14   SpO2 99%   Physical Exam  Constitutional: She appears well-developed and well-nourished. No distress.  HENT:  Head: Normocephalic and atraumatic.  Eyes: Conjunctivae are normal.  Neck: Neck supple.  Cardiovascular: Normal rate and regular rhythm.   No murmur heard. Pulmonary/Chest: Effort normal and breath sounds normal. No respiratory distress.  Abdominal: Soft. There is no tenderness.  Musculoskeletal: She exhibits tenderness (right ankle. Able to actively range. ). She exhibits no edema.  Neurological: She is alert.  Skin: Skin is warm and dry.  Psychiatric: She has a normal mood and affect.  Nursing note and vitals reviewed.    ED Treatments / Results  Labs (all labs ordered are listed, but only abnormal results are displayed) Labs Reviewed  COMPREHENSIVE METABOLIC PANEL - Abnormal; Notable for the following:       Result Value   CO2 21 (*)    Glucose, Bld 164 (*)    Albumin 3.1 (*)    AST 13 (*)    Alkaline Phosphatase 138 (*)    All other components within normal limits  CBC WITH DIFFERENTIAL/PLATELET - Abnormal; Notable for the following:    WBC 15.1 (*)    RBC 5.12 (*)    Hemoglobin 11.9 (*)    MCV 74.4 (*)    MCH 23.2 (*)    RDW 20.3 (*)    Neutro Abs 11.9 (*)    Monocytes Absolute 1.1 (*)    All other components within normal limits  SEDIMENTATION RATE  C-REACTIVE PROTEIN  I-STAT CG4 LACTIC ACID, ED  I-STAT CG4 LACTIC ACID, ED    EKG  EKG Interpretation None       Radiology No results found.  Procedures Procedures (including critical care time)  Bedside ultrasound performed to visualize the right ankle. Images saved and archived. Results interpreted by Drs. August Saucerean and [employer]. Demonstrate no joint effusion. Mild cobblestoning.  Medications Ordered in ED Medications - No data to display   Initial Impression / Assessment and Plan / ED Course  I have reviewed the triage vital  signs and the nursing notes.  Pertinent labs & imaging results that were available during my care of the patient were reviewed by me and considered in my medical decision making (see chart for details).  Clinical Course    Patient presents for right ankle/lower leg pain.  The onset of pain was sudden but without acute trauma.  She has an area of erythema and warmth to the region.  Considering cellulitis vs septic joint vs fracture.  Ultrasound imaging obtained at bedside as described above. Doubt septic arthritis, gout. Possible cellulitis.  XR right ankle demonstrates acute tib/fib fracture.  Patient placed in splint. Given pain control with good results.  Given pain medications and antibiotics for possible cellulitis.  Patient  is discharged to home with strict return precautions, follow up instructions, and educational materials.  Final Clinical Impressions(s) / ED Diagnoses   Final diagnoses:  Cellulitis of right leg  Right tibial fracture, closed, initial encounter  Right fibular fracture, closed, initial encounter    New Prescriptions Discharge Medication List as of 06/02/2016 11:09 PM    START taking these medications   Details  cephALEXin (KEFLEX) 500 MG capsule Take 1 capsule (500 mg total) by mouth 4 (four) times daily., Starting Sat 06/02/2016, Until Thu 06/07/2016, Print    HYDROcodone-acetaminophen (HYCET) 7.5-325 mg/15 ml solution Take 10 mLs by mouth 4 (four) times daily as needed for moderate pain., Starting Sat 06/02/2016, Print         Garey Ham, MD 06/02/16 2345    Blane Ohara, MD 06/02/16 1610    Blane Ohara, MD 06/05/16 9604

## 2016-06-02 NOTE — ED Triage Notes (Signed)
Patient here with right ankle swelling with redness and pain since Wednesday. Denies trauma, denies drainage, warm to touch

## 2016-06-02 NOTE — Progress Notes (Signed)
Orthopedic Tech Progress Note Patient Details:  Lucianne LeiSheila M Bara-Hart 08/07/1956 [id number]  Ortho Devices Type of Ortho Device: Post (short leg) splint Ortho Device/Splint Location: rle Ortho Device/Splint Interventions: Ordered, Application Pt had crutches ordered but couldn't use them safely. Pt has walker at and will be using that for getting around.  Trinna PostMartinez, Maryama Kuriakose J 06/02/2016, 10:32 PM

## 2016-06-02 NOTE — Discharge Instructions (Addendum)
Non weight bearing, call ortho doctor.  For severe pain take norco or vicodin however realize they have the potential for addiction and it can make you sleepy and has tylenol in it.  No operating machinery while taking.   If you were given medicines take as directed.  If you are on coumadin or contraceptives realize their levels and effectiveness is altered by many different medicines.  If you have any reaction (rash, tongues swelling, other) to the medicines stop taking and see a physician.    If your blood pressure was elevated in the ER make sure you follow up for management with a primary doctor or return for chest pain, shortness of breath or stroke symptoms.  Please follow up as directed and return to the ER or see a physician for new or worsening symptoms.  Thank you. Vitals:   06/02/16 1543 06/02/16 1746 06/02/16 1900 06/02/16 1915  BP: 147/84 155/80 155/75 178/82  Pulse: 75 66 60 64  Resp: 18 14    Temp: 99.1 F (37.3 C)     TempSrc: Oral     SpO2: 100% 99% 100% 100%

## 2016-06-02 NOTE — ED Notes (Signed)
Ortho paged. 

## 2016-06-10 ENCOUNTER — Emergency Department (HOSPITAL_COMMUNITY): Payer: BC Managed Care – PPO

## 2016-06-10 ENCOUNTER — Encounter (HOSPITAL_COMMUNITY)
Admission: EM | Disposition: A | Discharge status: Home / Self Care | Payer: Self-pay | Source: Home / Self Care | Attending: Orthopedic Surgery

## 2016-06-10 ENCOUNTER — Ambulatory Visit: Admit: 2016-06-10 | Payer: BC Managed Care – PPO | Source: Home / Self Care | Admitting: Orthopedic Surgery

## 2016-06-10 ENCOUNTER — Encounter (HOSPITAL_COMMUNITY): Payer: Self-pay | Admitting: Emergency Medicine

## 2016-06-10 ENCOUNTER — Emergency Department (HOSPITAL_COMMUNITY): Payer: BC Managed Care – PPO | Admitting: Anesthesiology

## 2016-06-10 ENCOUNTER — Inpatient Hospital Stay (HOSPITAL_COMMUNITY)
Admission: EM | Admit: 2016-06-10 | Discharge: 2016-06-11 | DRG: 494 | Disposition: A | Discharge status: Home / Self Care | Payer: BC Managed Care – PPO | Attending: Orthopedic Surgery | Admitting: Orthopedic Surgery

## 2016-06-10 DIAGNOSIS — E1159 Type 2 diabetes mellitus with other circulatory complications: Secondary | ICD-10-CM

## 2016-06-10 DIAGNOSIS — I10 Essential (primary) hypertension: Secondary | ICD-10-CM | POA: Diagnosis present

## 2016-06-10 DIAGNOSIS — S82251A Displaced comminuted fracture of shaft of right tibia, initial encounter for closed fracture: Secondary | ICD-10-CM | POA: Diagnosis present

## 2016-06-10 DIAGNOSIS — F17210 Nicotine dependence, cigarettes, uncomplicated: Secondary | ICD-10-CM | POA: Diagnosis present

## 2016-06-10 DIAGNOSIS — E119 Type 2 diabetes mellitus without complications: Secondary | ICD-10-CM | POA: Diagnosis present

## 2016-06-10 DIAGNOSIS — S82451A Displaced comminuted fracture of shaft of right fibula, initial encounter for closed fracture: Secondary | ICD-10-CM | POA: Diagnosis present

## 2016-06-10 DIAGNOSIS — Z01811 Encounter for preprocedural respiratory examination: Secondary | ICD-10-CM | POA: Diagnosis present

## 2016-06-10 DIAGNOSIS — S82201A Unspecified fracture of shaft of right tibia, initial encounter for closed fracture: Secondary | ICD-10-CM | POA: Diagnosis present

## 2016-06-10 DIAGNOSIS — Y92219 Unspecified school as the place of occurrence of the external cause: Secondary | ICD-10-CM

## 2016-06-10 DIAGNOSIS — S82301A Unspecified fracture of lower end of right tibia, initial encounter for closed fracture: Secondary | ICD-10-CM | POA: Diagnosis present

## 2016-06-10 DIAGNOSIS — Z794 Long term (current) use of insulin: Secondary | ICD-10-CM

## 2016-06-10 DIAGNOSIS — E1151 Type 2 diabetes mellitus with diabetic peripheral angiopathy without gangrene: Secondary | ICD-10-CM

## 2016-06-10 DIAGNOSIS — T148XXA Other injury of unspecified body region, initial encounter: Secondary | ICD-10-CM

## 2016-06-10 DIAGNOSIS — S82401A Unspecified fracture of shaft of right fibula, initial encounter for closed fracture: Secondary | ICD-10-CM

## 2016-06-10 DIAGNOSIS — S82831A Other fracture of upper and lower end of right fibula, initial encounter for closed fracture: Secondary | ICD-10-CM | POA: Diagnosis present

## 2016-06-10 HISTORY — DX: Unspecified fracture of shaft of right tibia, initial encounter for closed fracture: S82.201A

## 2016-06-10 HISTORY — PX: TIBIA IM NAIL INSERTION: SHX2516

## 2016-06-10 LAB — CBC WITH DIFFERENTIAL/PLATELET
BASOS ABS: 0 10*3/uL (ref 0.0–0.1)
BASOS PCT: 0 %
Eosinophils Absolute: 0.3 10*3/uL (ref 0.0–0.7)
Eosinophils Relative: 2 %
HCT: 42.6 % (ref 36.0–46.0)
HEMOGLOBIN: 13 g/dL (ref 12.0–15.0)
LYMPHS PCT: 18 %
Lymphs Abs: 2.6 10*3/uL (ref 0.7–4.0)
MCH: 22.8 pg — AB (ref 26.0–34.0)
MCHC: 30.5 g/dL (ref 30.0–36.0)
MCV: 74.9 fL — ABNORMAL LOW (ref 78.0–100.0)
MONOS PCT: 5 %
Monocytes Absolute: 0.7 10*3/uL (ref 0.1–1.0)
NEUTROS ABS: 11 10*3/uL — AB (ref 1.7–7.7)
NEUTROS PCT: 75 %
Platelets: 288 10*3/uL (ref 150–400)
RBC: 5.69 MIL/uL — ABNORMAL HIGH (ref 3.87–5.11)
RDW: 19.8 % — ABNORMAL HIGH (ref 11.5–15.5)
WBC: 14.6 10*3/uL — ABNORMAL HIGH (ref 4.0–10.5)

## 2016-06-10 LAB — BASIC METABOLIC PANEL
ANION GAP: 9 (ref 5–15)
BUN: 28 mg/dL — ABNORMAL HIGH (ref 6–20)
CO2: 25 mmol/L (ref 22–32)
Calcium: 9.5 mg/dL (ref 8.9–10.3)
Chloride: 104 mmol/L (ref 101–111)
Creatinine, Ser: 0.76 mg/dL (ref 0.44–1.00)
GFR calc Af Amer: >60 mL/min (ref >60)
GLUCOSE: 190 mg/dL — AB (ref 65–99)
POTASSIUM: 5.4 mmol/L — AB (ref 3.5–5.1)
Sodium: 138 mmol/L (ref 135–145)

## 2016-06-10 LAB — GLUCOSE, CAPILLARY
GLUCOSE-CAPILLARY: 174 mg/dL — AB (ref 65–99)
GLUCOSE-CAPILLARY: 228 mg/dL — AB (ref 65–99)

## 2016-06-10 LAB — PROTIME-INR
INR: 1.01
PROTHROMBIN TIME: 13.3 s (ref 11.4–15.2)

## 2016-06-10 LAB — APTT: aPTT: 26 seconds (ref 24–36)

## 2016-06-10 SURGERY — INSERTION, INTRAMEDULLARY ROD, TIBIA
Anesthesia: General | Site: Leg Upper | Laterality: Right

## 2016-06-10 MED ORDER — ONDANSETRON HCL 4 MG/2ML IJ SOLN
INTRAMUSCULAR | Status: DC | PRN
  Administered 2016-06-10: 4 mg via INTRAVENOUS

## 2016-06-10 MED ORDER — VANCOMYCIN HCL IN DEXTROSE 1-5 GM/200ML-% IV SOLN
INTRAVENOUS | Status: AC
  Filled 2016-06-10: qty 200

## 2016-06-10 MED ORDER — LACTATED RINGERS IV SOLN: INTRAVENOUS | Status: DC

## 2016-06-10 MED ORDER — BUPIVACAINE-EPINEPHRINE 0.5% -1:200000 IJ SOLN
INTRAMUSCULAR | Status: DC | PRN
  Administered 2016-06-10: 20 mL

## 2016-06-10 MED ORDER — OXYCODONE HCL 5 MG PO TABS
5.0000 mg | ORAL_TABLET | ORAL | Status: DC | PRN
  Administered 2016-06-10 – 2016-06-11 (×4): 10 mg via ORAL
  Filled 2016-06-10 (×4): qty 2

## 2016-06-10 MED ORDER — ASPIRIN EC 325 MG PO TBEC
325.0000 mg | DELAYED_RELEASE_TABLET | Freq: Every day | ORAL | Status: DC
  Administered 2016-06-10 – 2016-06-11 (×2): 325 mg via ORAL
  Filled 2016-06-10 (×2): qty 1

## 2016-06-10 MED ORDER — AMLODIPINE BESYLATE 5 MG PO TABS
5.0000 mg | ORAL_TABLET | Freq: Every day | ORAL | Status: DC
  Administered 2016-06-10: 5 mg via ORAL
  Filled 2016-06-10: qty 1

## 2016-06-10 MED ORDER — SODIUM CHLORIDE 0.9 % IV SOLN: INTRAVENOUS | Status: DC

## 2016-06-10 MED ORDER — VANCOMYCIN HCL 1000 MG IV SOLR
INTRAVENOUS | Status: DC | PRN
  Administered 2016-06-10: 1000 mg via INTRAVENOUS

## 2016-06-10 MED ORDER — ACETAMINOPHEN 650 MG RE SUPP: 650.0000 mg | Freq: Four times a day (QID) | RECTAL | Status: DC | PRN

## 2016-06-10 MED ORDER — DEXTROSE 5 % IV SOLN
500.0000 mg | Freq: Four times a day (QID) | INTRAVENOUS | Status: DC | PRN
  Administered 2016-06-10: 500 mg via INTRAVENOUS
  Filled 2016-06-10 (×2): qty 5

## 2016-06-10 MED ORDER — ASPIRIN 325 MG PO TBEC: 325.0000 mg | DELAYED_RELEASE_TABLET | Freq: Every day | ORAL | 0 refills | Status: DC

## 2016-06-10 MED ORDER — ROCURONIUM BROMIDE 100 MG/10ML IV SOLN
INTRAVENOUS | Status: DC | PRN
  Administered 2016-06-10: 60 mg via INTRAVENOUS

## 2016-06-10 MED ORDER — BUPIVACAINE-EPINEPHRINE (PF) 0.5% -1:200000 IJ SOLN
INTRAMUSCULAR | Status: AC
  Filled 2016-06-10: qty 30

## 2016-06-10 MED ORDER — ZOLPIDEM TARTRATE 5 MG PO TABS: 5.0000 mg | ORAL_TABLET | Freq: Every evening | ORAL | Status: DC | PRN

## 2016-06-10 MED ORDER — OXYCODONE-ACETAMINOPHEN 5-325 MG PO TABS: 1.0000 | ORAL_TABLET | Freq: Four times a day (QID) | ORAL | 0 refills | Status: DC | PRN

## 2016-06-10 MED ORDER — PROPOFOL 10 MG/ML IV BOLUS
INTRAVENOUS | Status: DC | PRN
  Administered 2016-06-10: 150 mg via INTRAVENOUS

## 2016-06-10 MED ORDER — FENTANYL CITRATE (PF) 100 MCG/2ML IJ SOLN
INTRAMUSCULAR | Status: AC
  Filled 2016-06-10: qty 4

## 2016-06-10 MED ORDER — MEPERIDINE HCL 25 MG/ML IJ SOLN: 6.2500 mg | INTRAMUSCULAR | Status: DC | PRN

## 2016-06-10 MED ORDER — MIDAZOLAM HCL 2 MG/2ML IJ SOLN
INTRAMUSCULAR | Status: AC
  Filled 2016-06-10: qty 2

## 2016-06-10 MED ORDER — HYDROMORPHONE HCL 1 MG/ML IJ SOLN
0.5000 mg | INTRAMUSCULAR | Status: DC | PRN
  Administered 2016-06-10 – 2016-06-11 (×2): 1 mg via INTRAVENOUS
  Filled 2016-06-10 (×2): qty 1

## 2016-06-10 MED ORDER — METHOCARBAMOL 500 MG PO TABS
500.0000 mg | ORAL_TABLET | Freq: Four times a day (QID) | ORAL | Status: DC | PRN
  Administered 2016-06-10 – 2016-06-11 (×3): 500 mg via ORAL
  Filled 2016-06-10 (×4): qty 1

## 2016-06-10 MED ORDER — POVIDONE-IODINE 10 % EX SWAB: 2.0000 "application " | Freq: Once | CUTANEOUS | Status: DC

## 2016-06-10 MED ORDER — LACTATED RINGERS IV SOLN
INTRAVENOUS | Status: DC | PRN
  Administered 2016-06-10 (×2): via INTRAVENOUS

## 2016-06-10 MED ORDER — FENTANYL CITRATE (PF) 100 MCG/2ML IJ SOLN
INTRAMUSCULAR | Status: DC | PRN
  Administered 2016-06-10 (×4): 50 ug via INTRAVENOUS

## 2016-06-10 MED ORDER — CEFAZOLIN SODIUM-DEXTROSE 2-4 GM/100ML-% IV SOLN
2.0000 g | INTRAVENOUS | Status: AC
  Administered 2016-06-10: 2 g via INTRAVENOUS
  Filled 2016-06-10: qty 100

## 2016-06-10 MED ORDER — CHLORHEXIDINE GLUCONATE 4 % EX LIQD: 60.0000 mL | Freq: Once | CUTANEOUS | Status: DC

## 2016-06-10 MED ORDER — INSULIN GLARGINE 100 UNIT/ML ~~LOC~~ SOLN
20.0000 [IU] | Freq: Every day | SUBCUTANEOUS | Status: DC
  Administered 2016-06-10: 20 [IU] via SUBCUTANEOUS
  Filled 2016-06-10 (×2): qty 0.2

## 2016-06-10 MED ORDER — ACETAMINOPHEN 325 MG PO TABS
650.0000 mg | ORAL_TABLET | Freq: Four times a day (QID) | ORAL | Status: DC | PRN
  Administered 2016-06-11: 650 mg via ORAL
  Filled 2016-06-10: qty 2

## 2016-06-10 MED ORDER — ONDANSETRON HCL 4 MG/2ML IJ SOLN: 4.0000 mg | Freq: Four times a day (QID) | INTRAMUSCULAR | Status: DC | PRN

## 2016-06-10 MED ORDER — PROMETHAZINE HCL 25 MG/ML IJ SOLN: 6.2500 mg | INTRAMUSCULAR | Status: DC | PRN

## 2016-06-10 MED ORDER — SUGAMMADEX SODIUM 200 MG/2ML IV SOLN
INTRAVENOUS | Status: DC | PRN
  Administered 2016-06-10: 200 mg via INTRAVENOUS

## 2016-06-10 MED ORDER — HYDROMORPHONE HCL 1 MG/ML IJ SOLN
INTRAMUSCULAR | Status: AC
  Filled 2016-06-10: qty 1

## 2016-06-10 MED ORDER — LIDOCAINE HCL (CARDIAC) 20 MG/ML IV SOLN
INTRAVENOUS | Status: DC | PRN
  Administered 2016-06-10: 50 mg via INTRAVENOUS

## 2016-06-10 MED ORDER — 0.9 % SODIUM CHLORIDE (POUR BTL) OPTIME
TOPICAL | Status: DC | PRN
Start: 2016-06-10 — End: 2016-06-10
  Administered 2016-06-10: 1000 mL

## 2016-06-10 MED ORDER — CEFAZOLIN SODIUM-DEXTROSE 2-4 GM/100ML-% IV SOLN
2.0000 g | Freq: Four times a day (QID) | INTRAVENOUS | Status: AC
  Administered 2016-06-10 – 2016-06-11 (×3): 2 g via INTRAVENOUS
  Filled 2016-06-10 (×3): qty 100

## 2016-06-10 MED ORDER — HYDROMORPHONE HCL 1 MG/ML IJ SOLN
0.2500 mg | INTRAMUSCULAR | Status: DC | PRN
  Administered 2016-06-10 (×4): 0.5 mg via INTRAVENOUS

## 2016-06-10 MED ORDER — IRBESARTAN 150 MG PO TABS
150.0000 mg | ORAL_TABLET | Freq: Every day | ORAL | Status: DC
  Administered 2016-06-11: 150 mg via ORAL
  Filled 2016-06-10 (×2): qty 1

## 2016-06-10 MED ORDER — ONDANSETRON HCL 4 MG PO TABS: 4.0000 mg | ORAL_TABLET | Freq: Four times a day (QID) | ORAL | Status: DC | PRN

## 2016-06-10 MED ORDER — INSULIN ASPART 100 UNIT/ML ~~LOC~~ SOLN
0.0000 [IU] | Freq: Three times a day (TID) | SUBCUTANEOUS | Status: DC
  Administered 2016-06-11: 5 [IU] via SUBCUTANEOUS
  Administered 2016-06-11: 3 [IU] via SUBCUTANEOUS

## 2016-06-10 MED ORDER — CEFAZOLIN SODIUM-DEXTROSE 2-4 GM/100ML-% IV SOLN: 2.0000 g | INTRAVENOUS | Status: DC

## 2016-06-10 MED ORDER — KETOROLAC TROMETHAMINE 15 MG/ML IJ SOLN
7.5000 mg | Freq: Four times a day (QID) | INTRAMUSCULAR | Status: AC
  Administered 2016-06-10 – 2016-06-11 (×4): 7.5 mg via INTRAVENOUS
  Filled 2016-06-10 (×3): qty 1

## 2016-06-10 MED ORDER — MIDAZOLAM HCL 5 MG/5ML IJ SOLN
INTRAMUSCULAR | Status: DC | PRN
  Administered 2016-06-10: 2 mg via INTRAVENOUS

## 2016-06-10 MED ORDER — PHENYLEPHRINE HCL 10 MG/ML IJ SOLN
INTRAVENOUS | Status: DC | PRN
  Administered 2016-06-10: 10 ug/min via INTRAVENOUS

## 2016-06-10 MED ORDER — KETOROLAC TROMETHAMINE 15 MG/ML IJ SOLN
INTRAMUSCULAR | Status: AC
  Filled 2016-06-10: qty 1

## 2016-06-10 SURGICAL SUPPLY — 68 items
BANDAGE ACE 4X5 VEL STRL LF (GAUZE/BANDAGES/DRESSINGS) ×2 IMPLANT
BANDAGE ACE 6X5 VEL STRL LF (GAUZE/BANDAGES/DRESSINGS) ×2 IMPLANT
BANDAGE ELASTIC 6 VELCRO ST LF (GAUZE/BANDAGES/DRESSINGS) ×2 IMPLANT
BANDAGE ESMARK 6X9 LF (GAUZE/BANDAGES/DRESSINGS) ×1 IMPLANT
BIT DRILL 3.8X6 NS (BIT) ×2 IMPLANT
BIT DRILL 4.4 NS (BIT) ×2 IMPLANT
BLADE SURG 15 STRL LF DISP TIS (BLADE) ×1 IMPLANT
BLADE SURG 15 STRL SS (BLADE) ×1
BLADE SURG ROTATE 9660 (MISCELLANEOUS) IMPLANT
BNDG COHESIVE 4X5 WHT NS (GAUZE/BANDAGES/DRESSINGS) ×2 IMPLANT
BNDG COHESIVE 6X5 TAN STRL LF (GAUZE/BANDAGES/DRESSINGS) ×2 IMPLANT
BNDG ESMARK 6X9 LF (GAUZE/BANDAGES/DRESSINGS) ×2
BNDG GAUZE ELAST 4 BULKY (GAUZE/BANDAGES/DRESSINGS) ×2 IMPLANT
CUFF TOURNIQUET SINGLE 34IN LL (TOURNIQUET CUFF) ×2 IMPLANT
CUFF TOURNIQUET SINGLE 44IN (TOURNIQUET CUFF) IMPLANT
DRAPE C-ARM 42X72 X-RAY (DRAPES) ×2 IMPLANT
DRAPE IMP U-DRAPE 54X76 (DRAPES) ×2 IMPLANT
DRAPE ORTHO SPLIT 77X108 STRL (DRAPES) ×2
DRAPE PROXIMA HALF (DRAPES) ×4 IMPLANT
DRAPE SURG ORHT 6 SPLT 77X108 (DRAPES) ×2 IMPLANT
DRAPE U-SHAPE 47X51 STRL (DRAPES) ×2 IMPLANT
DURAPREP 26ML APPLICATOR (WOUND CARE) ×2 IMPLANT
ELECT REM PT RETURN 9FT ADLT (ELECTROSURGICAL) ×2
ELECTRODE REM PT RTRN 9FT ADLT (ELECTROSURGICAL) ×1 IMPLANT
GAUZE SPONGE 4X4 12PLY STRL (GAUZE/BANDAGES/DRESSINGS) ×4 IMPLANT
GAUZE XEROFORM 1X8 LF (GAUZE/BANDAGES/DRESSINGS) ×2 IMPLANT
GAUZE XEROFORM 5X9 LF (GAUZE/BANDAGES/DRESSINGS) ×2 IMPLANT
GLOVE BIOGEL PI IND STRL 8 (GLOVE) ×2 IMPLANT
GLOVE BIOGEL PI INDICATOR 8 (GLOVE) ×2
GLOVE ECLIPSE 7.5 STRL STRAW (GLOVE) ×4 IMPLANT
GOWN STRL REUS W/ TWL LRG LVL3 (GOWN DISPOSABLE) ×1 IMPLANT
GOWN STRL REUS W/ TWL XL LVL3 (GOWN DISPOSABLE) ×2 IMPLANT
GOWN STRL REUS W/TWL LRG LVL3 (GOWN DISPOSABLE) ×1
GOWN STRL REUS W/TWL XL LVL3 (GOWN DISPOSABLE) ×2
GUIDEPIN 3.2X17.5 THRD DISP (PIN) ×2 IMPLANT
GUIDEWIRE BALL NOSE 80CM (WIRE) ×2 IMPLANT
KIT BASIN OR (CUSTOM PROCEDURE TRAY) ×2 IMPLANT
KIT ROOM TURNOVER OR (KITS) ×2 IMPLANT
MANIFOLD NEPTUNE II (INSTRUMENTS) ×2 IMPLANT
NAIL TIBIA 13X34.5 (Nail) ×2 IMPLANT
NEEDLE 22X1 1/2 (OR ONLY) (NEEDLE) ×2 IMPLANT
NS IRRIG 1000ML POUR BTL (IV SOLUTION) ×2 IMPLANT
PACK GENERAL/GYN (CUSTOM PROCEDURE TRAY) ×2 IMPLANT
PACK UNIVERSAL I (CUSTOM PROCEDURE TRAY) ×2 IMPLANT
PAD ARMBOARD 7.5X6 YLW CONV (MISCELLANEOUS) ×4 IMPLANT
PAD CAST 4YDX4 CTTN HI CHSV (CAST SUPPLIES) ×1 IMPLANT
PADDING CAST COTTON 4X4 STRL (CAST SUPPLIES) ×1
PADDING CAST COTTON 6X4 STRL (CAST SUPPLIES) ×2 IMPLANT
SCREW ACECAP 40MM (Screw) ×2 IMPLANT
SCREW ACECAP 46MM (Screw) ×2 IMPLANT
SCREW ACECAP 52MM (Screw) ×2 IMPLANT
SCREW PROXIMAL DEPUY (Screw) ×2 IMPLANT
SCREW PRXML FT 50X5.5XLCK NS (Screw) ×1 IMPLANT
SCREW PRXML FT 65X5.5XNS CORT (Screw) ×1 IMPLANT
SPLINT FIBERGLASS 4X30 (CAST SUPPLIES) ×2 IMPLANT
SPONGE GAUZE 4X4 12PLY STER LF (GAUZE/BANDAGES/DRESSINGS) ×2 IMPLANT
STAPLER VISISTAT 35W (STAPLE) ×4 IMPLANT
STOCKINETTE IMPERVIOUS LG (DRAPES) ×2 IMPLANT
SUT VIC AB 0 CT1 27 (SUTURE) ×1
SUT VIC AB 0 CT1 27XBRD ANBCTR (SUTURE) ×1 IMPLANT
SUT VIC AB 0 CTB1 27 (SUTURE) ×2 IMPLANT
SUT VIC AB 2-0 CT1 27 (SUTURE) ×1
SUT VIC AB 2-0 CT1 TAPERPNT 27 (SUTURE) ×1 IMPLANT
SUT VIC AB 2-0 CTB1 (SUTURE) ×2 IMPLANT
SYR CONTROL 10ML LL (SYRINGE) ×2 IMPLANT
TOWEL OR 17X24 6PK STRL BLUE (TOWEL DISPOSABLE) ×2 IMPLANT
TOWEL OR 17X26 10 PK STRL BLUE (TOWEL DISPOSABLE) ×2 IMPLANT
WATER STERILE IRR 1000ML POUR (IV SOLUTION) ×2 IMPLANT

## 2016-06-10 NOTE — ED Notes (Signed)
Patient transported to X-ray 

## 2016-06-10 NOTE — Discharge Instructions (Signed)
Ambulate NON WEIGHT BEARING on the right Elevate your leg as much as possible

## 2016-06-10 NOTE — Transfer of Care (Signed)
Immediate Anesthesia Transfer of Care Note  Patient: [name]  Procedure(s) Performed: Procedure(s): INTRAMEDULLARY (IM) NAIL TIBIAL RODING (Right)  Patient Location: PACU  Anesthesia Type:General  Level of Consciousness: awake  Airway & Oxygen Therapy: Patient Spontanous Breathing and Patient connected to nasal cannula oxygen  Post-op Assessment: Report given to RN and Post -op Vital signs reviewed and stable  Post vital signs: Reviewed and stable  Last Vitals:  Vitals:   06/10/16 1146  BP: 134/67  Pulse: 71  Resp: 18  Temp: 36.9 C    Last Pain:  Vitals:   06/10/16 1146  TempSrc: Oral  PainSc:          Complications: No apparent anesthesia complications

## 2016-06-10 NOTE — Anesthesia Postprocedure Evaluation (Signed)
Anesthesia Post Note  Patient: [name]  Procedure(s) Performed: Procedure(s) (LRB): INTRAMEDULLARY (IM) NAIL TIBIAL RODING (Right)  Patient location during evaluation: PACU Anesthesia Type: General Level of consciousness: awake and alert Pain management: pain level controlled Vital Signs Assessment: post-procedure vital signs reviewed and stable Respiratory status: spontaneous breathing, nonlabored ventilation, respiratory function stable and patient connected to nasal cannula oxygen Cardiovascular status: blood pressure returned to baseline and stable Postop Assessment: no signs of nausea or vomiting Anesthetic complications: no    Last Vitals:  Vitals:   06/10/16 1627 06/10/16 1638  BP: (!) 205/85 (!) 178/88  Pulse: 60 (!) 59  Resp: 11 15  Temp:      Last Pain:  Vitals:   06/10/16 1146  TempSrc: Oral  PainSc:                  Shelton SilvasKevin D Zanaiya Calabria

## 2016-06-10 NOTE — ED Triage Notes (Signed)
For surgery at 1330 today; told to come to ED now for labs and consent and to wait for surgery. Note says call Gus PumaJim Bethune, PA if questions [phone].
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
## 2016-06-10 NOTE — Brief Op Note (Signed)
06/10/2016  3:18 PM  PATIENT:  [name]  60 y.o. female  PRE-OPERATIVE DIAGNOSIS:  tibial roding  POST-OPERATIVE DIAGNOSIS:  tibial roding  PROCEDURE:  Procedure(s): INTRAMEDULLARY (IM) NAIL TIBIAL RODING (Right)  SURGEON:  Surgeon(s) and Role:    * Jodi GeraldsJohn Dasan Hardman, MD - Primary  PHYSICIAN ASSISTANT:   ASSISTANTS: bethune   ANESTHESIA:   general  EBL:  No intake/output data recorded.  BLOOD ADMINISTERED:none  DRAINS: none   LOCAL MEDICATIONS USED:  NONE  SPECIMEN:  No Specimen  DISPOSITION OF SPECIMEN:  N/A  COUNTS:  YES  TOURNIQUET:   Total Tourniquet Time Documented: Thigh (Right) - 58 minutes Total: Thigh (Right) - 58 minutes   DICTATION: .Other Dictation: Dictation Number [id number]  PLAN OF CARE: Admit to inpatient   PATIENT DISPOSITION:  PACU - hemodynamically stable.   Delay start of Pharmacological VTE agent (>24hrs) due to surgical blood loss or risk of bleeding: no

## 2016-06-10 NOTE — ED Notes (Signed)
Patient returned from X-ray; transporting to short stay for OR.

## 2016-06-10 NOTE — ED Provider Notes (Signed)
Patient was sent to the emergency department for lab and x-rays prior to having a tibial rod placed.  She has no emergency complaints.  She is no acute distress other than her leg has no complaints.  She stable for discharge to the OR.   Kristin Nayobert Deionna Marcantonio, MD 06/10/16 1224

## 2016-06-10 NOTE — Anesthesia Procedure Notes (Signed)
Procedure Name: Intubation Date/Time: 06/10/2016 2:33 PM Performed by: Tillman AbideHAWKINS, Santhosh Gulino B Pre-anesthesia Checklist: Patient identified, Emergency Drugs available, Suction available and Patient being monitored Patient Re-evaluated:Patient Re-evaluated prior to inductionOxygen Delivery Method: Circle System Utilized Preoxygenation: Pre-oxygenation with 100% oxygen Intubation Type: IV induction Ventilation: Mask ventilation without difficulty Laryngoscope Size: Miller and 1 Grade View: Grade II Tube type: Oral Number of attempts: 1 Airway Equipment and Method: Stylet Placement Confirmation: ETT inserted through vocal cords under direct vision,  positive ETCO2 and breath sounds checked- equal and bilateral Secured at: 21 cm Tube secured with: Tape Dental Injury: Teeth and Oropharynx as per pre-operative assessment  Difficulty Due To: Difficulty was unanticipated

## 2016-06-10 NOTE — Anesthesia Preprocedure Evaluation (Addendum)
Anesthesia Evaluation  Patient identified by MRN, date of birth, ID band Patient awake    Reviewed: Allergy & Precautions, NPO status , Patient's Chart, lab work & pertinent test results  Airway Mallampati: I  TM Distance: >3 FB Neck ROM: Full    Dental  (+) Edentulous Upper, Edentulous Lower   Pulmonary Current Smoker,    breath sounds clear to auscultation       Cardiovascular hypertension, Pt. on medications  Rhythm:Regular Rate:Normal     Neuro/Psych negative neurological ROS  negative psych ROS   GI/Hepatic negative GI ROS, Neg liver ROS,   Endo/Other  diabetes, Type 2, Insulin Dependent  Renal/GU negative Renal ROS  negative genitourinary   Musculoskeletal negative musculoskeletal ROS (+)   Abdominal   Peds negative pediatric ROS (+)  Hematology negative hematology ROS (+)   Anesthesia Other Findings   Reproductive/Obstetrics negative OB ROS                            Anesthesia Physical Anesthesia Plan  ASA: II  Anesthesia Plan: General   Post-op Pain Management:    Induction: Intravenous  Airway Management Planned: Oral ETT  Additional Equipment:   Intra-op Plan:   Post-operative Plan: Extubation in OR  Informed Consent: I have reviewed the patients History and Physical, chart, labs and discussed the procedure including the risks, benefits and alternatives for the proposed anesthesia with the patient or authorized representative who has indicated his/her understanding and acceptance.   Dental advisory given  Plan Discussed with: CRNA  Anesthesia Plan Comments:         Anesthesia Quick Evaluation

## 2016-06-11 ENCOUNTER — Encounter (HOSPITAL_COMMUNITY): Payer: Self-pay | Admitting: Orthopedic Surgery

## 2016-06-11 LAB — MRSA PCR SCREENING: MRSA by PCR: NEGATIVE

## 2016-06-11 LAB — GLUCOSE, CAPILLARY
GLUCOSE-CAPILLARY: 190 mg/dL — AB (ref 65–99)
GLUCOSE-CAPILLARY: 225 mg/dL — AB (ref 65–99)

## 2016-06-11 NOTE — Care Management Note (Signed)
Case Management Note  Patient Details  Name: Kristin LeiSheila M Bara-Hart MRN: [id number] Date of Birth: 10/25/1955  Subjective/Objective:        Admitted for surgery IM Nailing          Action/Plan: CM talked to patient to offer [facility] choice, [facility]rie DandyMary [facility]. DME ordered and to be delivered to the room today prior to discharging home.  Expected Discharge Date:      06/11/2016            Expected Discharge Plan:  Home w Home Health Services     Choice offered to:  Patient  HH Arranged:  [facility]  Status of Service:  In process, will continue to follow  Reola MosherChandler, Breniya Goertzen L, RN,MHA,BSN [phone] 06/11/2016, 10:32 AM

## 2016-06-11 NOTE — Progress Notes (Signed)
Subjective: 1 Day Post-Op Procedure(s) (LRB): INTRAMEDULLARY (IM) NAIL TIBIAL RODING (Right) Patient reports pain as mild. Taking by mouth and voiding okay.     Objective: Vital signs in last 24 hours: Temp:  [97.8 F (36.6 C)-98.9 F (37.2 C)] 98.9 F (37.2 C) (10/02 0430) Pulse Rate:  [59-71] 62 (10/02 0430) Resp:  [10-18] 16 (10/02 0430) BP: (119-205)/(51-88) 129/59 (10/02 0430) SpO2:  [92 %-100 %] 92 % (10/02 0500)  Intake/Output from previous day: 10/01 0701 - 10/02 0700 In: 1200 [P.O.:200; I.V.:1000] Out: 325 [Urine:250; Blood:75] Intake/Output this shift: No intake/output data recorded.   Recent Labs  06/10/16 1207  HGB 13.0    Recent Labs  06/10/16 1207  WBC 14.6*  RBC 5.69*  HCT 42.6  PLT 288    Recent Labs  06/10/16 1207  NA 138  K 5.4*  CL 104  CO2 25  BUN 28*  CREATININE 0.76  GLUCOSE 190*  CALCIUM 9.5    Recent Labs  06/10/16 1207  INR 1.01   Right ankle exam. Posterior splint intact, moves toes actively. Good sensation in toes.    Assessment/Plan: 1 Day Post-Op Procedure(s) (LRB): INTRAMEDULLARY (IM) NAIL TIBIAL RODING (Right)  Plan:  Discharge home after PT. NWB on right.  Asprin daily 325mg  QD for 1 month.  F/u Dr. Luiz BlareGraves in 2 weeks.     [name] 06/11/2016, 8:16 AM
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
## 2016-06-11 NOTE — Evaluation (Signed)
Physical Therapy Evaluation Patient Details Name: [name] MRN: [id number] DOB: 07/26/1956 Today's Date: 06/11/2016   History of Present Illness  Patient is a 60 y/o female with hx of HTN, DM and back pain presents s/p Rt IM nail right tibia.   Clinical Impression  Patient presents with pain and post surgical deficits s/p above surgery impacting mobility. Focused on gait training with NWB RLE. Pt fatigues quickly and only able to ambulate 7 feet before needing to sit. Pt reports she will have 24/7 support at home from family. Emphasized the importance of elevation, ice, NWB, activity, safety etc. Will follow acutely to maximize independence and mobility prior to return home.     Follow Up Recommendations Home health PT;Supervision/Assistance - 24 hour;Supervision for mobility/OOB    Equipment Recommendations  Rolling walker with 5" wheels;Wheelchair (measurements PT);Wheelchair cushion (measurements PT) (with elevating leg rests)    Recommendations for Other Services OT consult     Precautions / Restrictions Precautions Precautions: Fall Restrictions Weight Bearing Restrictions: Yes RLE Weight Bearing: Non weight bearing      Mobility  Bed Mobility               General bed mobility comments: Sitting EOB upon PT arrival.   Transfers Overall transfer level: Needs assistance Equipment used: Rolling walker (2 wheeled) Transfers: Sit to/from Stand Sit to Stand: Min guard         General transfer comment: Min guard for safety with cues for hand placement/technique and cues to adhere to NWB during transitions.  Ambulation/Gait Ambulation/Gait assistance: Min assist Ambulation Distance (Feet): 7 Feet (+7') Assistive device: Rolling walker (2 wheeled) Gait Pattern/deviations: Step-to pattern;Trunk flexed ("swing to") Gait velocity: decreased Gait velocity interpretation: Below normal speed for age/gender General Gait Details: Cues to decrease speed for safety.  Fatigues quickly. Requires seated rest break after 7 feet. Able to maintain NWb.  Stairs            Wheelchair Mobility    Modified Rankin (Stroke Patients Only)       Balance Overall balance assessment: Needs assistance Sitting-balance support: Feet supported;No upper extremity supported Sitting balance-Leahy Scale: Good     Standing balance support: During functional activity;Bilateral upper extremity supported Standing balance-Leahy Scale: Poor Standing balance comment: Reliant on BUEs for support in standing and to maintain NWB RLE.                             Pertinent Vitals/Pain Pain Assessment: Faces Faces Pain Scale: Hurts little more Pain Location: RLE Pain Descriptors / Indicators: Sore;Throbbing Pain Intervention(s): Monitored during session;Repositioned    Home Living Family/patient expects to be discharged to:: Private residence Living Arrangements: Other relatives (niece) Available Help at Discharge: Family;Available 24 hours/day Type of Home: House Home Access: Level entry     Home Layout: One level Home Equipment: Walker - 4 wheels;Bedside commode;Shower seat      Prior Function Level of Independence: Independent         Comments: Works as Lawyersubstitute teacher. Reports her niece will be there and if not, someone will be there around the clock.     Hand Dominance        Extremity/Trunk Assessment   Upper Extremity Assessment: Defer to OT evaluation           Lower Extremity Assessment: Generalized weakness;RLE deficits/detail RLE Deficits / Details: Able to perform SLR.       Communication   Communication:  No difficulties  Cognition Arousal/Alertness: Awake/alert Behavior During Therapy: Impulsive Overall Cognitive Status: Within Functional Limits for tasks assessed (questionable safety awareness- but pt very independent so this is prob baseline)                      General Comments General comments  (skin integrity, edema, etc.): Discussed elevation, ice, exercises, safety at home (removing throw rugs, furniture, to have assist with tx into shower) etc.    Exercises     Assessment/Plan    PT Assessment Patient needs continued PT services  PT Problem List Decreased strength;Decreased mobility;Decreased safety awareness;Decreased activity tolerance;Decreased balance;Pain          PT Treatment Interventions DME instruction;Therapeutic activities;Gait training;Therapeutic exercise;Patient/family education;Balance training;Functional mobility training;Wheelchair mobility training    PT Goals (Current goals can be found in the Care Plan section)  Acute Rehab PT Goals Patient Stated Goal: to go home ASAP PT Goal Formulation: With patient Time For Goal Achievement: 06/25/16 Potential to Achieve Goals: Good    Frequency Min 3X/week   Barriers to discharge        Co-evaluation               End of Session Equipment Utilized During Treatment: Gait belt Activity Tolerance: Patient limited by fatigue Patient left: in chair;with call bell/phone within reach Nurse Communication: Mobility status         Time: 1610-9604 PT Time Calculation (min) (ACUTE ONLY): 21 min   Charges:   PT Evaluation $PT Eval Moderate Complexity: 1 Procedure     PT G Codes:        [place] A Murlean Seelye 06/11/2016, 10:02 AM Mylo Red, PT, DPT [phone]

## 2016-06-11 NOTE — Progress Notes (Signed)
Pt received discharge instructions and prescriptions, verbalized understanding. IV removed by tech. Will be wheeled to car by volunteer services.

## 2016-06-13 NOTE — Discharge Summary (Signed)
Patient ID: [name] MRN: [id number] DOB/AGE: 1956-06-11 60 y.o.  Admit date: 06/10/2016 Discharge date: 06/11/2016 Admission Diagnoses:  Principal Problem:   Closed fracture of distal end of right fibula and tibia Active Problems:   Diabetes (HCC)   Traumatic closed displaced fracture of shaft of right tibia and fibula   Discharge Diagnoses:  Same  Past Medical History:  Diagnosis Date  . Back pain   . Diabetes mellitus without complication (HCC)   . Hypertension     Surgeries: Procedure(s): INTRAMEDULLARY (IM) NAIL TIBIAL RODING on 06/10/2016   Discharged Condition: Improved  Hospital Course: [name] is an 60 y.o. female who was admitted 06/10/2016 for operative treatment ofClosed fracture of distal end of right fibula and tibia. Patient has severe unremitting pain that affects sleep, daily activities, and work/hobbies. After pre-op clearance the patient was taken to the operating room on 06/10/2016 and underwent  Procedure(s):Right INTRAMEDULLARY (IM) NAIL TIBIAL RODING.    Patient was given perioperative antibiotics: Anti-infectives    Start     Dose/Rate Route Frequency Ordered Stop   06/11/16 0600  ceFAZolin (ANCEF) IVPB 2g/100 mL premix  Status:  Discontinued     2 g 200 mL/hr over 30 Minutes Intravenous On call to O.R. 06/10/16 1540 06/10/16 1652   06/10/16 1930  ceFAZolin (ANCEF) IVPB 2g/100 mL premix     2 g 200 mL/hr over 30 Minutes Intravenous Every 6 hours 06/10/16 1655 06/11/16 0649   06/10/16 1200  ceFAZolin (ANCEF) IVPB 2g/100 mL premix     2 g 200 mL/hr over 30 Minutes Intravenous On call to O.R. 06/10/16 1155 06/10/16 1356       Patient was given sequential compression devices, early ambulation, and chemoprophylaxis to prevent DVT.She did well with physical therapy. She was nonweightbearing on the right. Aspirin was used for DVT prophylaxis.  Patient benefited maximally from hospital stay and there were no complications.    Recent  vital signs see chart for details   Recent laboratory studies:  Recent Labs  06/10/16 1207  WBC 14.6*  HGB 13.0  HCT 42.6  PLT 288  NA 138  K 5.4*  CL 104  CO2 25  BUN 28*  CREATININE 0.76  GLUCOSE 190*  INR 1.01  CALCIUM 9.5     Discharge Medications:     Medication List    STOP taking these medications   HYDROcodone-acetaminophen 7.5-325 mg/15 ml solution Commonly known as:  HYCET   ibuprofen 200 MG tablet Commonly known as:  ADVIL,MOTRIN   naproxen 125 MG/5ML suspension Commonly known as:  NAPROSYN     TAKE these medications   amLODipine 5 MG tablet Commonly known as:  NORVASC Take 5 mg by mouth at bedtime.   aspirin 325 MG EC tablet Take 1 tablet (325 mg total) by mouth daily. Take x 1 month post op to decrease risk of blood clots. What changed:  medication strength  how much to take  when to take this  additional instructions   Fluocinonide 0.1 % Crea Apply 1 application topically 2 (two) times daily as needed (for pain).   oxyCODONE-acetaminophen 5-325 MG tablet Commonly known as:  PERCOCET/ROXICET Take 1-2 tablets by mouth every 6 (six) hours as needed for severe pain.   polyethylene glycol packet Commonly known as:  MIRALAX / GLYCOLAX Take 17 g by mouth daily as needed (constipation).   TOUJEO SOLOSTAR 300 UNIT/ML Sopn Generic drug:  Insulin Glargine Inject 29 Units into the skin at bedtime.  valsartan 160 MG tablet Commonly known as:  DIOVAN Take 160 mg by mouth at bedtime.       Diagnostic Studies: Dg Chest 2 View  Result Date: 06/10/2016 CLINICAL DATA:  Pre-op for surgery of fractured tibia; No heart or respiratory problems; No chest pain; No ough; HTN meds; diabetes; smoker 1/2 PPD EXAM: CHEST  2 VIEW COMPARISON:  None. FINDINGS: Cardiac silhouette is top-normal in size. No mediastinal or hilar masses or evidence of adenopathy. Clear lungs.  No pleural effusion or pneumothorax. Skeletal structures are demineralized but grossly  intact. IMPRESSION: No active cardiopulmonary disease. Electronically Signed   By: Amie Portlandavid  Ormond M.D.   On: 06/10/2016 12:29   Dg Tibia/fibula Right  Result Date: 06/10/2016 CLINICAL DATA:  INTRAMEDULLARY (IM) NAIL TIBIAL RODING (Right Leg Upper) Radiation safety time out done JBM LMP verified by JBM Fluoro time 2 minute 34 seconds EXAM: DG C-ARM 61-120 MIN; RIGHT TIBIA AND FIBULA - 2 VIEW COMPARISON:  06/02/2016 FINDINGS: Intra medullary nail spans the tibia with 2 interlocking screws in the proximal metaphysis. Two interlocking screws in the distal metaphysis additionally. Improved alignment of the distal tibial fracture. Distal fibular fracture noted. IMPRESSION: Intra medullary nail fixation of distal tibial fracture. Electronically Signed   By: Genevive BiStewart  Edmunds M.D.   On: 06/10/2016 16:27   Dg Ankle Complete Right  Result Date: 06/02/2016 CLINICAL DATA:  Redness and swelling.  Trauma status unknown EXAM: RIGHT ANKLE - COMPLETE 3+ VIEW COMPARISON:  None. FINDINGS: Frontal, oblique, and lateral views were obtained. There is a comminuted fracture of the distal tibial diaphysis with mild posterior and medial angulation of the distal major fracture fragment with respect major proximal fragment. There is a fracture of the distal fibular diaphysis, also with medial and posterior angulation of the distal major fracture fragment with respect proximal fragment. No other fractures are evident. Ankle mortise appears intact. No appreciable joint effusion. There is a spur arising from the inferior calcaneus. IMPRESSION: Comminuted fractures of the distal tibial and fibular diaphyses with angulation as described. No other fractures are evident. Ankle mortise appears intact. Inferior calcaneal spur noted. Electronically Signed   By: Bretta BangWilliam  Woodruff III M.D.   On: 06/02/2016 20:01   Dg C-arm 1-60 Min  Result Date: 06/10/2016 CLINICAL DATA:  INTRAMEDULLARY (IM) NAIL TIBIAL RODING (Right Leg Upper) Radiation safety  time out done JBM LMP verified by JBM Fluoro time 2 minute 34 seconds EXAM: DG C-ARM 61-120 MIN; RIGHT TIBIA AND FIBULA - 2 VIEW COMPARISON:  06/02/2016 FINDINGS: Intra medullary nail spans the tibia with 2 interlocking screws in the proximal metaphysis. Two interlocking screws in the distal metaphysis additionally. Improved alignment of the distal tibial fracture. Distal fibular fracture noted. IMPRESSION: Intra medullary nail fixation of distal tibial fracture. Electronically Signed   By: Genevive BiStewart  Edmunds M.D.   On: 06/10/2016 16:27    Disposition: 01-Home or Self Care  Discharge Instructions    Call MD / Call 911    Complete by:  As directed    If you experience chest pain or shortness of breath, CALL 911 and be transported to the hospital emergency room.  If you develope a fever above 101 F, pus (white drainage) or increased drainage or redness at the wound, or calf pain, call your surgeon's office.   Diet Carb Modified    Complete by:  As directed    Increase activity slowly as tolerated    Complete by:  As directed    Non weight bearing  Complete by:  As directed    Laterality:  right   Extremity:  Lower   Non weight bearing    Complete by:  As directed    Laterality:  right   Extremity:  Lower      Follow-up Information    GRAVES,JOHN L, MD. Schedule an appointment as soon as possible for a visit in 2 week(s).   Specialty:  Orthopedic Surgery Contact information: [address] [place] [address] [phone]        [facility] .   Why:  They will do your physical therapy at your home after discharge Contact information: [address] [place] [address] [phone]            Signed: Matthew Folks 06/13/2016, 9:40 AM

## 2016-07-31 ENCOUNTER — Other Ambulatory Visit: Payer: Self-pay | Admitting: Internal Medicine

## 2016-07-31 DIAGNOSIS — Z1231 Encounter for screening mammogram for malignant neoplasm of breast: Secondary | ICD-10-CM

## 2016-08-01 ENCOUNTER — Ambulatory Visit
Admission: RE | Admit: 2016-08-01 | Discharge: 2016-08-01 | Disposition: A | Discharge status: Home / Self Care | Payer: BC Managed Care – PPO | Source: Ambulatory Visit | Attending: Internal Medicine | Admitting: Internal Medicine

## 2016-08-01 DIAGNOSIS — Z1231 Encounter for screening mammogram for malignant neoplasm of breast: Secondary | ICD-10-CM

## 2017-03-05 ENCOUNTER — Emergency Department (HOSPITAL_COMMUNITY)
Admission: EM | Admit: 2017-03-05 | Discharge: 2017-03-05 | Disposition: A | Discharge status: Home / Self Care | Payer: BC Managed Care – PPO | Attending: Emergency Medicine | Admitting: Emergency Medicine

## 2017-03-05 ENCOUNTER — Encounter (HOSPITAL_COMMUNITY): Payer: Self-pay

## 2017-03-05 DIAGNOSIS — M7022 Olecranon bursitis, left elbow: Secondary | ICD-10-CM

## 2017-03-05 DIAGNOSIS — Z79899 Other long term (current) drug therapy: Secondary | ICD-10-CM | POA: Insufficient documentation

## 2017-03-05 DIAGNOSIS — E119 Type 2 diabetes mellitus without complications: Secondary | ICD-10-CM | POA: Diagnosis not present

## 2017-03-05 DIAGNOSIS — Y939 Activity, unspecified: Secondary | ICD-10-CM | POA: Diagnosis not present

## 2017-03-05 DIAGNOSIS — Z7982 Long term (current) use of aspirin: Secondary | ICD-10-CM | POA: Insufficient documentation

## 2017-03-05 DIAGNOSIS — F17210 Nicotine dependence, cigarettes, uncomplicated: Secondary | ICD-10-CM | POA: Diagnosis not present

## 2017-03-05 DIAGNOSIS — M25522 Pain in left elbow: Secondary | ICD-10-CM | POA: Diagnosis present

## 2017-03-05 DIAGNOSIS — I10 Essential (primary) hypertension: Secondary | ICD-10-CM | POA: Diagnosis not present

## 2017-03-05 LAB — SYNOVIAL CELL COUNT + DIFF, W/ CRYSTALS
Crystals, Fluid: NONE SEEN
Lymphocytes-Synovial Fld: 2 % (ref 0–20)
Monocyte-Macrophage-Synovial Fluid: 8 % — ABNORMAL LOW (ref 50–90)
Neutrophil, Synovial: 90 % — ABNORMAL HIGH (ref 0–25)
WBC, SYNOVIAL: 34200 /mm3 — AB (ref 0–200)

## 2017-03-05 MED ORDER — CEPHALEXIN 500 MG PO CAPS: 500.0000 mg | ORAL_CAPSULE | Freq: Two times a day (BID) | ORAL | 0 refills | Status: AC

## 2017-03-05 MED ORDER — SULFAMETHOXAZOLE-TRIMETHOPRIM 800-160 MG PO TABS
1.0000 | ORAL_TABLET | Freq: Once | ORAL | Status: AC
  Administered 2017-03-05: 1 via ORAL
  Filled 2017-03-05: qty 1

## 2017-03-05 MED ORDER — SULFAMETHOXAZOLE-TRIMETHOPRIM 800-160 MG PO TABS: 1.0000 | ORAL_TABLET | Freq: Two times a day (BID) | ORAL | 0 refills | Status: AC

## 2017-03-05 MED ORDER — LIDOCAINE-EPINEPHRINE 1 %-1:100000 IJ SOLN
20.0000 mL | Freq: Once | INTRAMUSCULAR | Status: AC
  Administered 2017-03-05: 20 mL via INTRADERMAL
  Filled 2017-03-05: qty 20

## 2017-03-05 MED ORDER — CEPHALEXIN 250 MG PO CAPS
500.0000 mg | ORAL_CAPSULE | Freq: Once | ORAL | Status: AC
  Administered 2017-03-05: 500 mg via ORAL
  Filled 2017-03-05: qty 2

## 2017-03-05 NOTE — ED Provider Notes (Signed)
[facility]-EMERGENCY DEPT Provider Note   CSN: [id number] Arrival date & time: 03/05/17  1712     History   Chief Complaint Chief Complaint  Patient presents with  . Elbow Pain    HPI [name] is a 61 y.o. female.  The history is provided by the patient and medical records. No language interpreter was used.  Illness  This is a recurrent problem. The current episode started more than 2 days ago. The problem occurs constantly. The problem has not changed since onset.Pertinent negatives include no chest pain, no abdominal pain and no shortness of breath. Nothing aggravates the symptoms. Nothing relieves the symptoms. The treatment provided no relief.    Past Medical History:  Diagnosis Date  . Back pain   . Diabetes mellitus without complication (HCC)   . Hypertension     Patient Active Problem List   Diagnosis Date Noted  . Closed fracture of distal end of right fibula and tibia 06/10/2016  . Diabetes (HCC) 06/10/2016  . Traumatic closed displaced fracture of shaft of right tibia and fibula 06/10/2016    Past Surgical History:  Procedure Laterality Date  . ABDOMINAL HYSTERECTOMY    . TIBIA IM NAIL INSERTION Right 06/10/2016   Procedure: INTRAMEDULLARY (IM) NAIL TIBIAL RODING;  Surgeon: Jodi GeraldsJohn Graves, MD;  Location: [facility] OR;  Service: Orthopedics;  Laterality: Right;    OB History    No data available       Home Medications    Prior to Admission medications   Medication Sig Start Date End Date Taking? Authorizing Provider  amLODipine (NORVASC) 5 MG tablet Take 5 mg by mouth at bedtime.    Yes [provider]  aspirin EC 325 MG EC tablet Take 1 tablet (325 mg total) by mouth daily. Take x 1 month post op to decrease risk of blood clots. 06/10/16  Yes Marshia LyBethune, James, PA-C  Fluocinonide 0.1 % CREA Apply 1 application topically 2 (two) times daily as needed (for pain).  05/24/16  Yes [provider]  polyethylene glycol (MIRALAX / GLYCOLAX) packet Take  17 g by mouth daily as needed (constipation).   Yes [provider]  TRESIBA FLEXTOUCH 200 UNIT/ML SOPN Inject 29 Units into the skin at bedtime. 01/22/17  Yes [provider]  valsartan (DIOVAN) 160 MG tablet Take 160 mg by mouth at bedtime.    Yes [provider]  cephALEXin (KEFLEX) 500 MG capsule Take 1 capsule (500 mg total) by mouth 2 (two) times daily. 03/05/17 03/12/17  Hebert SohoMu, Hays Dunnigan, MD  oxyCODONE-acetaminophen (PERCOCET/ROXICET) 5-325 MG tablet Take 1-2 tablets by mouth every 6 (six) hours as needed for severe pain. Patient not taking: Reported on 03/05/2017 06/10/16   Marshia LyBethune, James, PA-C  sulfamethoxazole-trimethoprim (BACTRIM DS,SEPTRA DS) 800-160 MG tablet Take 1 tablet by mouth 2 (two) times daily. 03/05/17 03/12/17  Hebert SohoMu, Shalaine Payson, MD    Family History No family history on file.  Social History Social History  Substance Use Topics  . Smoking status: Current Every Day Smoker    Types: Cigarettes  . Smokeless tobacco: Never Used  . Alcohol use No     Comment: opccasionally      Allergies   Patient has no known allergies.   Review of Systems Review of Systems  Constitutional: Negative for chills and fever.  HENT: Negative for ear pain and sore throat.   Eyes: Negative for pain and visual disturbance.  Respiratory: Negative for cough and shortness of breath.   Cardiovascular: Negative for chest  pain and palpitations.  Gastrointestinal: Negative for abdominal pain and vomiting.  Genitourinary: Negative for dysuria and hematuria.  Musculoskeletal: Negative for arthralgias and back pain.       Positive for L elbow swelling  Skin: Negative for color change and rash.  Neurological: Negative for seizures and syncope.  All other systems reviewed and are negative.    Physical Exam Updated Vital Signs BP (!) 157/81   Pulse 63   Temp 98.4 F (36.9 C) (Oral)   Resp 18   SpO2 100%   Physical Exam  Constitutional: She appears well-developed and  well-nourished.  HENT:  Head: Normocephalic and atraumatic.  Eyes: Conjunctivae are normal.  Neck: Neck supple.  Cardiovascular: Normal rate and regular rhythm.   No murmur heard. Pulmonary/Chest: Effort normal and breath sounds normal. No respiratory distress.  Abdominal: Soft. There is no tenderness.  Musculoskeletal:  Large prominent and fluctuant bursa of L elbow. Full ROM of L elbow without pain.   Neurological: She is alert. No cranial nerve deficit. Coordination normal.  5/5 motor strength and intact sensation in all extremities. Finger-to-nose intact bilaterally  Skin: Skin is warm and dry.  Nursing note and vitals reviewed.    ED Treatments / Results  Labs (all labs ordered are listed, but only abnormal results are displayed) Labs Reviewed  SYNOVIAL CELL COUNT + DIFF, W/ CRYSTALS - Abnormal; Notable for the following:       Result Value   Color, Synovial STRAW (*)    Appearance-Synovial TURBID (*)    WBC, Synovial 34,200 (*)    Neutrophil, Synovial 90 (*)    Monocyte-Macrophage-Synovial Fluid 8 (*)    All other components within normal limits  BODY FLUID CULTURE  GLUCOSE, SYNOVIAL FLUID  MISC [facility] TEST (SEND OUT)    EKG  EKG Interpretation None       Radiology No results found.  Procedures Aspiration of blood/fluid Date/Time: 03/05/2017 9:00 PM Performed by: Hebert Soho Authorized by: Hebert Soho  Consent: Verbal consent obtained. Written consent obtained. Risks and benefits: risks, benefits and alternatives were discussed Consent given by: patient Patient understanding: patient states understanding of the procedure being performed Patient consent: the patient's understanding of the procedure matches consent given Procedure consent: procedure consent matches procedure scheduled Patient identity confirmed: arm band and hospital-assigned identification number Preparation: Patient was prepped and draped in the usual sterile fashion.  Sedation: Patient  sedated: no Patient tolerance: Patient tolerated the procedure well with no immediate complications  .Marland KitchenIncision and Drainage Date/Time: 03/05/2017 9:00 PM Performed by: Hebert Soho Authorized by: Hebert Soho   Consent:    Consent obtained:  Verbal and written   Consent given by:  Patient   Risks discussed:  Incomplete drainage, bleeding, infection and pain   Alternatives discussed:  No treatment Location:    Type:  Bursa   Size:  10 cm   Location:  Upper extremity   Upper extremity location:  Elbow   Elbow location:  L elbow Pre-procedure details:    Skin preparation:  Chloraprep Anesthesia (see MAR for exact dosages):    Anesthesia method:  Local infiltration   Local anesthetic:  Lidocaine 1% WITH epi Procedure details:    Incision types:  Stab incision   Incision depth:  Dermal   Scalpel blade:  11   Drainage amount:  Scant   Wound treatment:  Wound left open   Packing materials:  None Post-procedure details:    Patient tolerance of procedure:  Tolerated well, no immediate complications   (  including critical care time)  Medications Ordered in ED Medications  lidocaine-EPINEPHrine (XYLOCAINE W/EPI) 1 %-1:100000 (with pres) injection 20 mL (20 mLs Intradermal Given by Other 03/05/17 2100)  sulfamethoxazole-trimethoprim (BACTRIM DS,SEPTRA DS) 800-160 MG per tablet 1 tablet (1 tablet Oral Given 03/05/17 2214)  cephALEXin (KEFLEX) capsule 500 mg (500 mg Oral Given 03/05/17 2214)     Initial Impression / Assessment and Plan / ED Course  I have reviewed the triage vital signs and the nursing notes.  Pertinent labs & imaging results that were available during my care of the patient were reviewed by me and considered in my medical decision making (see chart for details).     61 year old female history of diabetes, hypertension, and recurrent boils who presents with L olecranon bursitis.  Patient describes worsening swelling over the past week.  She has full range of motion of left  elbow without pain.  She states she has recurrent boils all over her body.  She denies fevers, vomiting, or other systemic pain.  She denies any active or previous drainage.  She states she previously had smaller swelling of her olecranon bursa but does not remember her treatment course.  No recent falls.  Afebrile, VSS.  Lungs clear to auscultation bilaterally.  Abdomen soft benign throughout.  No noted erythema over left elbow region.  Large fluctuant bursa over L olecranon and extending to proximal third of posterior L forearm. Full ROM of L elbow. Doubt septic joint.  Patient consented for aspiration and I&D of bursa. Site cleaned. Thick turbid fluid aspirated from bursa, sent for analysis. Only able to aspirate <5cc of fluid. Bursa remains fluctuant and prominent after aspiration. I&D performed, unable to extrude much additional fluid despite fluctuance. Will cover with bactrim/keflex. Pt will f/u with Dr. Lajoyce Corners of Orthopedics.  Return precautions provided for worsening symptoms. Pt will f/u with PCP at first availability. Pt verbalized agreement with plan.  Pt care d/w Dr. Adela Lank  Final Clinical Impressions(s) / ED Diagnoses   Final diagnoses:  Olecranon bursitis of left elbow    New Prescriptions Discharge Medication List as of 03/05/2017 10:00 PM    START taking these medications   Details  cephALEXin (KEFLEX) 500 MG capsule Take 1 capsule (500 mg total) by mouth 2 (two) times daily., Starting Tue 03/05/2017, Until Tue 03/12/2017, Print    sulfamethoxazole-trimethoprim (BACTRIM DS,SEPTRA DS) 800-160 MG tablet Take 1 tablet by mouth 2 (two) times daily., Starting Tue 03/05/2017, Until Tue 03/12/2017, Print         Zailey Audia, Homero Fellers, MD 03/06/17 1648    Melene Plan, DO 03/06/17 1725

## 2017-03-05 NOTE — ED Triage Notes (Signed)
Pt arrives with swelling wound to left elbow, appearing as an abscess. She reports she noticed it last week. No drainage at this time.

## 2017-03-05 NOTE — Discharge Instructions (Signed)
Please take antibiotics as directed. Please followup with Orthopedics at first availability

## 2017-03-07 LAB — MISC LABCORP TEST (SEND OUT): LABCORP TEST CODE: 19497

## 2017-03-09 LAB — BODY FLUID CULTURE

## 2017-03-10 ENCOUNTER — Telehealth: Payer: Self-pay

## 2017-03-10 NOTE — Telephone Encounter (Signed)
Post ED Visit - Positive Culture Follow-up  Culture report reviewed by antimicrobial stewardship pharmacist:  []  [name], Pharm.D. []  [name], Pharm.D., BCPS AQ-ID []  [name], Pharm.D., BCPS []  [name], Pharm.D., BCPS []  [name], [address] BoulevardPharm.D., BCPS, AAHIVP []  [name], Pharm.D., BCPS, AAHIVP [x]  [name], PharmD, BCPS []  [name], PharmD, BCPS []  [name], PharmD, BCPS  Positive body fluid culture Treated with Cephalexin, organism sensitive to the same and no further patient follow-up is required at this time.  [name], [name] 03/10/2017, 12:44 PM

## 2017-04-05 ENCOUNTER — Encounter (HOSPITAL_COMMUNITY): Payer: Self-pay | Admitting: *Deleted

## 2017-04-05 ENCOUNTER — Emergency Department (HOSPITAL_COMMUNITY)
Admission: EM | Admit: 2017-04-05 | Discharge: 2017-04-05 | Disposition: A | Discharge status: Home / Self Care | Payer: BC Managed Care – PPO | Attending: Emergency Medicine | Admitting: Emergency Medicine

## 2017-04-05 DIAGNOSIS — I10 Essential (primary) hypertension: Secondary | ICD-10-CM | POA: Diagnosis not present

## 2017-04-05 DIAGNOSIS — R2232 Localized swelling, mass and lump, left upper limb: Secondary | ICD-10-CM | POA: Diagnosis present

## 2017-04-05 DIAGNOSIS — F17210 Nicotine dependence, cigarettes, uncomplicated: Secondary | ICD-10-CM | POA: Insufficient documentation

## 2017-04-05 DIAGNOSIS — M71022 Abscess of bursa, left elbow: Secondary | ICD-10-CM | POA: Insufficient documentation

## 2017-04-05 DIAGNOSIS — Z79899 Other long term (current) drug therapy: Secondary | ICD-10-CM | POA: Diagnosis not present

## 2017-04-05 DIAGNOSIS — E119 Type 2 diabetes mellitus without complications: Secondary | ICD-10-CM | POA: Insufficient documentation

## 2017-04-05 MED ORDER — HYDROCODONE-ACETAMINOPHEN 5-325 MG PO TABS: 1.0000 | ORAL_TABLET | Freq: Four times a day (QID) | ORAL | 0 refills | Status: DC | PRN

## 2017-04-05 MED ORDER — LIDOCAINE-EPINEPHRINE (PF) 2 %-1:200000 IJ SOLN
20.0000 mL | Freq: Once | INTRAMUSCULAR | Status: AC
  Administered 2017-04-05: 20 mL via INTRADERMAL
  Filled 2017-04-05: qty 20

## 2017-04-05 MED ORDER — CEPHALEXIN 500 MG PO CAPS: 500.0000 mg | ORAL_CAPSULE | Freq: Four times a day (QID) | ORAL | 0 refills | Status: DC

## 2017-04-05 NOTE — ED Provider Notes (Signed)
[facility]-EM[facility] DEPT Provider Note   CSN: [id number] Arrival date & time: 04/05/17  1136   By signing my name below, I, Clarisse Gouge, attest that this documentation has been prepared under the direction and in the presence of Terance Hart, PA-C. Electronically signed, Clarisse Gouge, ED Scribe. 04/05/17. 12:22 PM.   History   Chief Complaint No chief complaint on file.  The history is provided by the patient and medical records. No language interpreter was used.    [name] is a 61 y.o. female with h/o DM and HTN presenting to the Emergency Department concerning persistent L elbow swelling that has fluctuated and worsened x > 1 month. Associated warmth. Pt states her elbow swelling has reduced in size minimally on 1 or 2 occasions, but never fully gone down. She was evaluated for this issue in [facility] ED on 03/05/2017, diagnosed with olecranon bursitis, and d/c'ed with Rx for Keflex after having the area partially drained. She describes mild to moderate, throbbing pain intermittently worse with certain movements. Upcoming F/U with orthopedic specialist 4 days from now. No fever or any other complaints noted at this time. She saw her PCP who advised her to come to the ED to have her elbow drained.  Past Medical History:  Diagnosis Date  . Back pain   . Diabetes mellitus without complication (HCC)   . Hypertension     Patient Active Problem List   Diagnosis Date Noted  . Closed fracture of distal end of right fibula and tibia 06/10/2016  . Diabetes (HCC) 06/10/2016  . Traumatic closed displaced fracture of shaft of right tibia and fibula 06/10/2016    Past Surgical History:  Procedure Laterality Date  . ABDOMINAL HYSTERECTOMY    . TIBIA IM NAIL INSERTION Right 06/10/2016   Procedure: INTRAMEDULLARY (IM) NAIL TIBIAL RODING;  Surgeon: Jodi Geralds, MD;  Location: [facility] OR;  Service: Orthopedics;  Laterality: Right;    OB History    No data available       Home Medications     Prior to Admission medications   Medication Sig Start Date End Date Taking? Authorizing Provider  amLODipine (NORVASC) 5 MG tablet Take 5 mg by mouth at bedtime.     [provider]  aspirin EC 325 MG EC tablet Take 1 tablet (325 mg total) by mouth daily. Take x 1 month post op to decrease risk of blood clots. 06/10/16   Marshia Ly, PA-C  Fluocinonide 0.1 % CREA Apply 1 application topically 2 (two) times daily as needed (for pain).  05/24/16   [provider]  oxyCODONE-acetaminophen (PERCOCET/ROXICET) 5-325 MG tablet Take 1-2 tablets by mouth every 6 (six) hours as needed for severe pain. Patient not taking: Reported on 03/05/2017 06/10/16   Marshia Ly, PA-C  polyethylene glycol [facility] Ethelene Hal) packet Take 17 g by mouth daily as needed (constipation).    [provider]  TRESIBA FLEXTOUCH 200 UNIT/ML SOPN Inject 29 Units into the skin at bedtime. 01/22/17   [provider]  valsartan (DIOVAN) 160 MG tablet Take 160 mg by mouth at bedtime.     [provider]    Family History No family history on file.  Social History Social History  Substance Use Topics  . Smoking status: Current Every Day Smoker    Types: Cigarettes  . Smokeless tobacco: Never Used  . Alcohol use No     Comment: opccasionally      Allergies   Patient has no known allergies.  Review of Systems Review of Systems  Constitutional: Negative for fever.  Gastrointestinal: Negative for abdominal pain, nausea and vomiting.  Musculoskeletal: Positive for arthralgias and joint swelling.  Skin: Negative for color change and wound.  Allergic/Immunologic: Negative for immunocompromised state.  Neurological: Negative for weakness and numbness.  Hematological: Does not bruise/bleed easily.  All other systems reviewed and are negative.    Physical Exam Updated Vital Signs BP (!) 170/76 (BP Location: Left Arm)   Pulse 65   Temp 98 F (36.7 C) (Oral)   Resp  16   Ht 5\' 7"  (1.702 m)   Wt 196 lb (88.9 kg)   SpO2 96%   BMI 30.70 kg/m   Physical Exam  Constitutional: She is oriented to person, place, and time. She appears well-developed and well-nourished. No distress.  HENT:  Head: Normocephalic and atraumatic.  Eyes: Pupils are equal, round, and reactive to light. Conjunctivae and EOM are normal. Right eye exhibits no discharge. Left eye exhibits no discharge. No scleral icterus.  Neck: Normal range of motion.  Cardiovascular: Normal rate.   Pulmonary/Chest: Effort normal. No respiratory distress.  Abdominal: She exhibits no distension.  Musculoskeletal: Normal range of motion.  Marked swelling of the L elbow with warmth, FROM, swelling is soft and fluctuant; see image.  Neurological: She is alert and oriented to person, place, and time.  Skin: Skin is warm and dry.  Psychiatric: She has a normal mood and affect. Her behavior is normal.  Nursing note and vitals reviewed.      ED Treatments / Results  DIAGNOSTIC STUDIES: Oxygen Saturation is 96% on RA, adequate by my interpretation.    COORDINATION OF CARE: 12:17 PM-Discussed next steps with pt. Pt verbalized understanding and is agreeable with the plan. Will consult attending.   Labs (all labs ordered are listed, but only abnormal results are displayed) Labs Reviewed - No data to display  EKG  EKG Interpretation None       Radiology No results found.  Procedures .[name].Incision and Drainage Date/Time: 04/05/2017 12:46 PM Performed by: Terance HartGEKAS, KELLY MARIE Authorized by: Terance HartGEKAS, KELLY MARIE   Consent:    Consent obtained:  Verbal   Consent given by:  Patient   Risks discussed:  Bleeding, incomplete drainage and pain   Alternatives discussed:  Referral Location:    Type:  Fluid collection   Location:  Upper extremity   Upper extremity location:  Elbow   Elbow location:  L elbow Pre-procedure details:    Skin preparation:  Betadine Anesthesia (see MAR for exact  dosages):    Anesthesia method:  Local infiltration   Local anesthetic:  Lidocaine 2% WITH epi (3 cc's) Procedure type:    Complexity:  Simple Procedure details:    Needle aspiration: no     Incision types:  Stab incision   Incision depth:  Dermal   Scalpel blade:  11   Wound management:  Probed and deloculated, irrigated with saline and extensive cleaning   Drainage:  Purulent and bloody   Drainage amount:  Copious   Wound treatment:  Wound left open   Packing materials:  1/4 in iodoform gauze Post-procedure details:    Patient tolerance of procedure:  Tolerated well, no immediate complications    (including critical care time)  Medications Ordered in ED Medications - No data to display   Initial Impression / Assessment and Plan / ED Course  I have reviewed the triage vital signs and the nursing notes.  Pertinent labs & imaging results  that were available during my care of the patient were reviewed by me and considered in my medical decision making (see chart for details).  61 year old with olecranon bursitis with marked swelling and abscess formation. I&D performed and patient tolerated well. It was packed with iodoform gauze. Patient is afebrile. Discussed wound care and signs of infection (fever, chills, increasing pain, redness, or drainage at site). Will d/c with Keflex, pain medicine, and advised return for wound recheck and repacking in 2-3 days. She verbalized understanding.   Final Clinical Impressions(s) / ED Diagnoses   Final diagnoses:  Abscess of bursa of left elbow    New Prescriptions New Prescriptions   No medications on file   I personally performed the services described in this documentation, which was scribed in my presence. The recorded information has been reviewed and is accurate.    Bethel BornGekas, Kelly Marie, PA-C 04/05/17 1452    Jacalyn LefevreHaviland, Julie, MD 04/05/17 [phone]

## 2017-04-05 NOTE — ED Notes (Signed)
Suture cart w/ I/D tray outside of room

## 2017-04-05 NOTE — ED Triage Notes (Signed)
Pt c/o L elbow pain & swelling hx of the same, sent by PCP for potential drainage, pt denies fever & chills, MAE, skin intact, A&O x4

## 2017-04-05 NOTE — Discharge Instructions (Signed)
Take pain medicine as needed Take antibiotic as prescribed Return for wound check and repacking in 2 days

## 2017-05-10 ENCOUNTER — Encounter (HOSPITAL_COMMUNITY): Payer: Self-pay | Admitting: *Deleted

## 2017-05-10 ENCOUNTER — Emergency Department (HOSPITAL_COMMUNITY): Payer: BC Managed Care – PPO

## 2017-05-10 ENCOUNTER — Emergency Department (HOSPITAL_COMMUNITY)
Admission: EM | Admit: 2017-05-10 | Discharge: 2017-05-11 | Disposition: A | Discharge status: Home / Self Care | Payer: BC Managed Care – PPO | Attending: Emergency Medicine | Admitting: Emergency Medicine

## 2017-05-10 DIAGNOSIS — F17210 Nicotine dependence, cigarettes, uncomplicated: Secondary | ICD-10-CM | POA: Insufficient documentation

## 2017-05-10 DIAGNOSIS — L02414 Cutaneous abscess of left upper limb: Secondary | ICD-10-CM

## 2017-05-10 DIAGNOSIS — Z79899 Other long term (current) drug therapy: Secondary | ICD-10-CM | POA: Insufficient documentation

## 2017-05-10 DIAGNOSIS — E119 Type 2 diabetes mellitus without complications: Secondary | ICD-10-CM | POA: Insufficient documentation

## 2017-05-10 DIAGNOSIS — I10 Essential (primary) hypertension: Secondary | ICD-10-CM | POA: Insufficient documentation

## 2017-05-10 DIAGNOSIS — Z794 Long term (current) use of insulin: Secondary | ICD-10-CM | POA: Insufficient documentation

## 2017-05-10 DIAGNOSIS — R2232 Localized swelling, mass and lump, left upper limb: Secondary | ICD-10-CM | POA: Diagnosis present

## 2017-05-10 MED ORDER — SULFAMETHOXAZOLE-TRIMETHOPRIM 800-160 MG PO TABS: 1.0000 | ORAL_TABLET | Freq: Two times a day (BID) | ORAL | 0 refills | Status: AC

## 2017-05-10 MED ORDER — LIDOCAINE-EPINEPHRINE (PF) 2 %-1:200000 IJ SOLN
20.0000 mL | Freq: Once | INTRAMUSCULAR | Status: AC
  Administered 2017-05-10: 20 mL
  Filled 2017-05-10: qty 20

## 2017-05-10 MED ORDER — HYDROCODONE-ACETAMINOPHEN 5-325 MG PO TABS: 1.0000 | ORAL_TABLET | Freq: Four times a day (QID) | ORAL | 0 refills | Status: DC | PRN

## 2017-05-10 MED ORDER — CEPHALEXIN 500 MG PO CAPS: 500.0000 mg | ORAL_CAPSULE | Freq: Three times a day (TID) | ORAL | 0 refills | Status: DC

## 2017-05-10 MED ORDER — HYDROCODONE-ACETAMINOPHEN 5-325 MG PO TABS
1.0000 | ORAL_TABLET | Freq: Once | ORAL | Status: AC
  Administered 2017-05-10: 1 via ORAL
  Filled 2017-05-10: qty 1

## 2017-05-10 NOTE — ED Triage Notes (Signed)
Pt reports recent hx of bursitis left elbow, was taking antibiotics as prescribed. Reports increase in swelling and now has drainage from elbow. Denies fever.

## 2017-05-10 NOTE — ED Notes (Addendum)
Elbow swollen no pain to the touch, there is some drainage coming out of the old incision. Pt states she did not do the follow up but did take all the antibiotics

## 2017-05-10 NOTE — ED Provider Notes (Signed)
[facility]-EMERGENCY DEPT Provider Note   CSN: [id number] Arrival date & time: 05/10/17  1447     History   Chief Complaint Chief Complaint  Patient presents with  . Wound Infection    HPI [name] is a 61 y.o. female.  HPI   61 year old female with history of hypertension and diabetes presenting for evaluation of persistent left elbow infection. For more than 2 months patient has had pain and swelling to the left elbow. She initially was seen in the ED at 03/05/2017 for left lower pain and was diagnosed with olecranon bursitis. Patient was discharged home with Keflex after having bursitis partially drained. She was seen again in the ED on 04/05/17 for the same complaint. Once again patient had an incision and drainage procedure with moderate amount of pustular discharge. Patient was discharged with recommendation to return for further evaluation. She was discharged with Keflex. Although patient did not return, she did take the antibiotic as prescribed. Her symptoms did improve however within the past 1-2 weeks it has reaccumulated and become more swollen. Is complaining of mild sharp pain to the affected area have any pain within her elbow joint. No report of fever, chills, or numbness. Is right-hand dominant. She is up-to-date with tetanus.  Past Medical History:  Diagnosis Date  . Back pain   . Diabetes mellitus without complication (HCC)   . Hypertension     Patient Active Problem List   Diagnosis Date Noted  . Closed fracture of distal end of right fibula and tibia 06/10/2016  . Diabetes (HCC) 06/10/2016  . Traumatic closed displaced fracture of shaft of right tibia and fibula 06/10/2016    Past Surgical History:  Procedure Laterality Date  . ABDOMINAL HYSTERECTOMY    . TIBIA IM NAIL INSERTION Right 06/10/2016   Procedure: INTRAMEDULLARY (IM) NAIL TIBIAL RODING;  Surgeon: Jodi Geralds, MD;  Location: [facility] OR;  Service: Orthopedics;  Laterality: Right;    OB History    No data available       Home Medications    Prior to Admission medications   Medication Sig Start Date End Date Taking? Authorizing Provider  amLODipine (NORVASC) 5 MG tablet Take 5 mg by mouth at bedtime.    Yes [provider]  aspirin EC 325 MG EC tablet Take 1 tablet (325 mg total) by mouth daily. Take x 1 month post op to decrease risk of blood clots. 06/10/16  Yes Marshia Ly, PA-C  Fluocinonide 0.1 % CREA Apply 1 application topically 2 (two) times daily as needed (for pain).  05/24/16  Yes [provider]  Insulin Degludec-Liraglutide (XULTOPHY) 100-3.6 UNIT-MG/ML SOPN Inject 19 Units into the skin at bedtime.   Yes [provider]  valsartan (DIOVAN) 160 MG tablet Take 160 mg by mouth at bedtime.    Yes [provider]  cephALEXin (KEFLEX) 500 MG capsule Take 1 capsule (500 mg total) by mouth 4 (four) times daily. Patient not taking: Reported on 05/10/2017 04/05/17   Bethel Born, PA-C  HYDROcodone-acetaminophen (NORCO/VICODIN) 5-325 MG tablet Take 1 tablet by mouth every 6 (six) hours as needed. Patient not taking: Reported on 05/10/2017 04/05/17   Bethel Born, PA-C    Family History History reviewed. No pertinent family history.  Social History Social History  Substance Use Topics  . Smoking status: Current Every Day Smoker    Types: Cigarettes  . Smokeless tobacco: Never Used  . Alcohol use No     Comment: opccasionally  Allergies   Patient has no known allergies.   Review of Systems Review of Systems  All other systems reviewed and are negative.    Physical Exam Updated Vital Signs BP (!) 145/69   Pulse 64   Temp 98.4 F (36.9 C)   Resp 18   SpO2 100%   Physical Exam  Constitutional: She appears well-developed and well-nourished. No distress.  HENT:  Head: Atraumatic.  Eyes: Conjunctivae are normal.  Neck: Neck supple.  Musculoskeletal: She exhibits tenderness (Left arm: A large area of  induration, fluctuance with warmth and tenderness noted distal to left elbow posteriorly involving the forearm. Forearm compartment is soft and no evidence of compartment syndrome. There is oozing a small amount of pus discharg).  Neurological: She is alert.  Skin: No rash noted.  Psychiatric: She has a normal mood and affect.  Nursing note and vitals reviewed.    ED Treatments / Results  Labs (all labs ordered are listed, but only abnormal results are displayed) Labs Reviewed - No data to display  EKG  EKG Interpretation None       Radiology Dg Elbow Complete Left  Result Date: 05/10/2017 CLINICAL DATA:  Recurring infection for several days. EXAM: LEFT ELBOW - COMPLETE 3+ VIEW COMPARISON:  None. FINDINGS: No fracture or other acute bone abnormality. Mild degenerative changes are present. No radiographic evidence of joint effusion. Marked soft tissue swelling at the extensor surface of the proximal forearm. No soft tissue foreign body. IMPRESSION: No bone lesion or bone destruction. Mild degenerative changes at the elbow. Electronically Signed   By: Ellery Plunkaniel R Mitchell M.D.   On: 05/10/2017 23:18    Procedures .[name].Incision and Drainage Date/Time: 05/10/2017 11:44 PM Performed by: Fayrene HelperRAN, Laken Lobato Authorized by: Fayrene HelperRAN, Cambrey Lupi   Consent:    Consent obtained:  Verbal   Consent given by:  Patient   Risks discussed:  Incomplete drainage, infection, damage to other organs and pain   Alternatives discussed:  No treatment, alternative treatment and referral Location:    Type:  Abscess   Size:  5cm   Location:  Upper extremity   Upper extremity location:  Arm   Arm location:  R lower arm Pre-procedure details:    Skin preparation:  Betadine Anesthesia (see MAR for exact dosages):    Anesthesia method:  Local infiltration   Local anesthetic:  Lidocaine 2% WITH epi Procedure type:    Complexity:  Complex Procedure details:    Needle aspiration: no     Incision types:  Stab incision    Incision depth:  Subcutaneous   Scalpel blade:  11   Wound management:  Probed and deloculated and extensive cleaning   Drainage:  Purulent   Drainage amount:  Copious   Wound treatment:  Drain placed   Packing materials:  1/4 in gauze   Amount 1/4":  2' Post-procedure details:    Patient tolerance of procedure:  Tolerated well, no immediate complications Comments:     Pt had retained packing approximately 2' in length embedded in her abscess from previous I&D a month ago   (including critical care time)    Medications Ordered in ED Medications  lidocaine-EPINEPHrine (XYLOCAINE W/EPI) 2 %-1:200000 (PF) injection 20 mL (20 mLs Infiltration Given 05/10/17 2258)  HYDROcodone-acetaminophen (NORCO/VICODIN) 5-325 MG per tablet 1 tablet (1 tablet Oral Given 05/10/17 2258)     Initial Impression / Assessment and Plan / ED Course  I have reviewed the triage vital signs and the nursing notes.  Pertinent labs &  imaging results that were available during my care of the patient were reviewed by me and considered in my medical decision making (see chart for details).     BP (!) 164/73   Pulse 60   Temp 98.4 F (36.9 C)   Resp 15   SpO2 100%    Final Clinical Impressions(s) / ED Diagnoses   Final diagnoses:  Abscess of left forearm    New Prescriptions New Prescriptions   SULFAMETHOXAZOLE-TRIMETHOPRIM (BACTRIM DS,SEPTRA DS) 800-160 MG TABLET    Take 1 tablet by mouth 2 (two) times daily.   10:39 PM Patient with recurrent left olecranon bursitis infection here with an obvious abscess to the affected area. He does not appear to be involving the elbow joint therefore I have low suspicion for septic joint. No systemic manifestation. She is a diabetic patient, will obtain x-ray of left elbow 2 assessment potential osteomyelitis however my suspicion is low. And to provide incision and drainage and antibiotic. Pain Medication given. She is neurovascularly intact.  11:50 PM Successful I&D  with copious amount of pustular discharge.  I was also able to remove 2' worth of packing that was placed a month ago when she was here for same.  Pt admits she did not f/u for packing removal.  Additional packing was placed, however I strongly encourage pt to return in 48 hrs for wound recheck and packing removal.  Recommend warm compress several times daily, to take keflex/bactrim and pain medication. In order to decrease risk of narcotic abuse. Pt's record were checked using the [place] Controlled Substance database.   Care discussed with Dr. Henderson Newcomer, Greta Doom, PA-C 05/11/17 1610    Pricilla Loveless, MD 05/12/17 1455

## 2017-05-11 NOTE — Discharge Instructions (Signed)
Please apply warm compress over affected area several times daily.  Take your antibiotics as prescribed.  Return in 48 hrs for wound recheck and packing removal.  Return sooner if you have any concerns.  Follow up with orthopedist as previously scheduled.

## 2017-05-12 ENCOUNTER — Ambulatory Visit (HOSPITAL_COMMUNITY)
Admission: EM | Admit: 2017-05-12 | Discharge: 2017-05-12 | Disposition: A | Discharge status: Home / Self Care | Payer: BC Managed Care – PPO | Attending: Emergency Medicine | Admitting: Emergency Medicine

## 2017-05-12 ENCOUNTER — Encounter (HOSPITAL_COMMUNITY): Payer: Self-pay | Admitting: Emergency Medicine

## 2017-05-12 DIAGNOSIS — Z4800 Encounter for change or removal of nonsurgical wound dressing: Secondary | ICD-10-CM | POA: Diagnosis not present

## 2017-05-12 DIAGNOSIS — L02414 Cutaneous abscess of left upper limb: Secondary | ICD-10-CM | POA: Diagnosis not present

## 2017-05-12 DIAGNOSIS — Z5189 Encounter for other specified aftercare: Secondary | ICD-10-CM

## 2017-05-12 DIAGNOSIS — I10 Essential (primary) hypertension: Secondary | ICD-10-CM | POA: Insufficient documentation

## 2017-05-12 DIAGNOSIS — E119 Type 2 diabetes mellitus without complications: Secondary | ICD-10-CM | POA: Insufficient documentation

## 2017-05-12 DIAGNOSIS — F17210 Nicotine dependence, cigarettes, uncomplicated: Secondary | ICD-10-CM | POA: Diagnosis present

## 2017-05-12 MED ORDER — HYDROCODONE-ACETAMINOPHEN 5-325 MG PO TABS: 1.0000 | ORAL_TABLET | Freq: Four times a day (QID) | ORAL | 0 refills | Status: DC | PRN

## 2017-05-12 NOTE — ED Provider Notes (Signed)
[facility]   [id number] 05/12/17 Arrival Time: 1631   SUBJECTIVE:  [name] is a 62 y.o. female who presents to the urgent care with complaint of wound check. She has had a septic olecranon bursa the left elbow since June, is been evaluated multiple times, and then multiple incision and drainages done. She received culture and sensitivity in June, which grew staph aureus, that was sensitive to all antibiotics that were tested against. Most recently she had an I&D performed 2 days ago and had packing placed, and she is here today to have the packing removed. She did have an appointment scheduled for follow-up with [facility] orthopedics, but she canceled this appointment. Has no fever, chills, fatigue, or other signs of systemic illness     Past Medical History:  Diagnosis Date  . Back pain   . Diabetes mellitus without complication (HCC)   . Hypertension    Social History   Social History  . Marital status: Divorced    Spouse name: N/A  . Number of children: N/A  . Years of education: N/A   Occupational History  . Not on file.   Social History Main Topics  . Smoking status: Current Every Day Smoker    Types: Cigarettes  . Smokeless tobacco: Never Used  . Alcohol use No     Comment: opccasionally   . Drug use: No  . Sexual activity: Not on file   Other Topics Concern  . Not on file   Social History Narrative  . No narrative on file   No outpatient prescriptions have been marked as taking for the 05/12/17 encounter [facility] Encounter).   No Known Allergies    ROS: As per HPI, remainder of ROS negative.   OBJECTIVE:  Vitals:   05/12/17 1715  BP: 134/62  Pulse: 88  Resp: 18  Temp: 99.4 F (37.4 C)  TempSrc: Oral  SpO2: 100%       General Appearance:  awake, alert, oriented, in no acute distress, well developed, well nourished and in no acute distress Skin:  skin color, texture, turgor are normal Head/face:  NCAT Ears:  External-  bilateral-  normal Lungs:  Clear and equal bilaterally Heart:  Heart regular rate and rhythm Abdomen:  Soft and nontender Joint:  L elbow:  There is swelling noted to the left elbow posterior aspect near the olecranon bursa, approximately 1 inch surgical incision noted no erythema or evidence of underlying cellulitis, is draining purulent material, patient removed her bandage at home, did not intentionally remove the packing, however there is no packing present within the wound. Neurologic:  Alert and oriented     Labs:  Labs Reviewed  AEROBIC/ANAEROBIC CULTURE (SURGICAL/DEEP WOUND)    No results found.     ASSESSMENT & PLAN:  1. Wound check, abscess     Meds ordered this encounter  Medications  . HYDROcodone-acetaminophen (NORCO/VICODIN) 5-325 MG tablet    Sig: Take 1 tablet by mouth every 6 (six) hours as needed for moderate pain or severe pain.    Dispense:  15 tablet    Refill:  0    Order Specific Question:   Supervising Provider    Answer:   Sharlene Dory [[id number]]   Continue antibiotics, wound was expressed, copious purulent material was drained from it, no evidence of packing within the wound cavity, will continue pain medicine for a few more days, and strongly recommend following up with orthopedics for further evaluation.  [place] controlled substances reporting  system consulted prior to issuing prescription, with the following findings below:  No narcotic medications prescribed last year. However, most likely is not been updated, she did receive prescription for hydrocodone 2 days ago following incision and drainage. Based on signs, symptoms, physical exam findings, I believe it is therapeutically appropriate to continue pain relief for a few more days  Reviewed expectations re: course of current medical issues. Questions answered. Outlined signs and symptoms indicating need for more acute intervention. Patient verbalized understanding. After  Visit Summary given.    Procedures:        Dorena BodoKennard, Ahmira Boisselle, NP 05/12/17 1851

## 2017-05-12 NOTE — Discharge Instructions (Signed)
Keep your wound clean, dry, change her bandages at least once a day. [facility] first thing Tuesday morning to schedule an appointment for follow-up care. If symptoms worsen or fail to resolve between now and then, return to clinic for further evaluation. Wound cultures have been obtained, and sensitivity testing will be done. I have also prescribed a medicine for pain called hydrocodone, this medicine is a narcotic, it will cause drowsiness, and it is addictive. Do not take more than what is necessary, do not drink alcohol while taking, and do not operate any heavy machinery while taking this medicine.

## 2017-05-12 NOTE — ED Triage Notes (Signed)
Pt is here for a f/u on left elbow bursitis ... [facility]  [facility]     Night       Had  It  drianed        2   Days     Has  A  Packing

## 2017-05-17 LAB — AEROBIC/ANAEROBIC CULTURE W GRAM STAIN (SURGICAL/DEEP WOUND)

## 2017-05-17 LAB — AEROBIC/ANAEROBIC CULTURE (SURGICAL/DEEP WOUND)

## 2017-06-07 ENCOUNTER — Other Ambulatory Visit: Payer: Self-pay | Admitting: Orthopedic Surgery

## 2017-06-10 ENCOUNTER — Encounter (HOSPITAL_BASED_OUTPATIENT_CLINIC_OR_DEPARTMENT_OTHER): Payer: Self-pay | Admitting: *Deleted

## 2017-06-19 ENCOUNTER — Ambulatory Visit (HOSPITAL_BASED_OUTPATIENT_CLINIC_OR_DEPARTMENT_OTHER): Payer: BC Managed Care – PPO | Admitting: Anesthesiology

## 2017-06-19 ENCOUNTER — Encounter (HOSPITAL_BASED_OUTPATIENT_CLINIC_OR_DEPARTMENT_OTHER): Payer: Self-pay | Admitting: Anesthesiology

## 2017-06-19 ENCOUNTER — Ambulatory Visit (HOSPITAL_BASED_OUTPATIENT_CLINIC_OR_DEPARTMENT_OTHER)
Admission: RE | Admit: 2017-06-19 | Discharge: 2017-06-19 | Disposition: A | Discharge status: Home / Self Care | Payer: BC Managed Care – PPO | Source: Ambulatory Visit | Attending: Orthopedic Surgery | Admitting: Orthopedic Surgery

## 2017-06-19 ENCOUNTER — Encounter (HOSPITAL_BASED_OUTPATIENT_CLINIC_OR_DEPARTMENT_OTHER)
Admission: RE | Disposition: A | Discharge status: Home / Self Care | Payer: Self-pay | Source: Ambulatory Visit | Attending: Orthopedic Surgery

## 2017-06-19 DIAGNOSIS — Z7982 Long term (current) use of aspirin: Secondary | ICD-10-CM | POA: Diagnosis not present

## 2017-06-19 DIAGNOSIS — F17210 Nicotine dependence, cigarettes, uncomplicated: Secondary | ICD-10-CM | POA: Diagnosis not present

## 2017-06-19 DIAGNOSIS — M7022 Olecranon bursitis, left elbow: Secondary | ICD-10-CM | POA: Insufficient documentation

## 2017-06-19 DIAGNOSIS — M795 Residual foreign body in soft tissue: Secondary | ICD-10-CM | POA: Diagnosis not present

## 2017-06-19 DIAGNOSIS — Z794 Long term (current) use of insulin: Secondary | ICD-10-CM | POA: Diagnosis not present

## 2017-06-19 DIAGNOSIS — I10 Essential (primary) hypertension: Secondary | ICD-10-CM | POA: Insufficient documentation

## 2017-06-19 DIAGNOSIS — E119 Type 2 diabetes mellitus without complications: Secondary | ICD-10-CM | POA: Diagnosis not present

## 2017-06-19 DIAGNOSIS — Z79899 Other long term (current) drug therapy: Secondary | ICD-10-CM | POA: Insufficient documentation

## 2017-06-19 HISTORY — PX: OLECRANON BURSECTOMY: SHX2097

## 2017-06-19 HISTORY — DX: Olecranon bursitis, left elbow: M70.22

## 2017-06-19 LAB — POCT I-STAT, CHEM 8
BUN: 23 mg/dL — AB (ref 6–20)
CALCIUM ION: 1.11 mmol/L — AB (ref 1.15–1.40)
CREATININE: 0.6 mg/dL (ref 0.44–1.00)
Chloride: 107 mmol/L (ref 101–111)
Glucose, Bld: 73 mg/dL (ref 65–99)
HEMATOCRIT: 37 % (ref 36.0–46.0)
HEMOGLOBIN: 12.6 g/dL (ref 12.0–15.0)
Potassium: 3.8 mmol/L (ref 3.5–5.1)
SODIUM: 143 mmol/L (ref 135–145)
TCO2: 27 mmol/L (ref 22–32)

## 2017-06-19 LAB — GLUCOSE, CAPILLARY: GLUCOSE-CAPILLARY: 98 mg/dL (ref 65–99)

## 2017-06-19 SURGERY — BURSECTOMY, ELBOW
Anesthesia: General | Site: Elbow | Laterality: Left

## 2017-06-19 MED ORDER — CEPHALEXIN 500 MG PO CAPS
500.0000 mg | ORAL_CAPSULE | Freq: Three times a day (TID) | ORAL | 0 refills | Status: AC
Start: 2017-06-19 — End: 2017-06-29

## 2017-06-19 MED ORDER — SCOPOLAMINE 1 MG/3DAYS TD PT72: 1.0000 | MEDICATED_PATCH | Freq: Once | TRANSDERMAL | Status: DC | PRN

## 2017-06-19 MED ORDER — BUPIVACAINE HCL (PF) 0.5 % IJ SOLN
INTRAMUSCULAR | Status: DC | PRN
  Administered 2017-06-19: 20 mL

## 2017-06-19 MED ORDER — ONDANSETRON HCL 4 MG/2ML IJ SOLN
INTRAMUSCULAR | Status: AC
  Filled 2017-06-19: qty 2

## 2017-06-19 MED ORDER — ONDANSETRON HCL 4 MG/2ML IJ SOLN
INTRAMUSCULAR | Status: DC | PRN
  Administered 2017-06-19: 4 mg via INTRAVENOUS

## 2017-06-19 MED ORDER — LIDOCAINE 2% (20 MG/ML) 5 ML SYRINGE
INTRAMUSCULAR | Status: DC | PRN
  Administered 2017-06-19: 60 mg via INTRAVENOUS

## 2017-06-19 MED ORDER — FENTANYL CITRATE (PF) 100 MCG/2ML IJ SOLN
INTRAMUSCULAR | Status: AC
  Filled 2017-06-19: qty 2

## 2017-06-19 MED ORDER — FENTANYL CITRATE (PF) 100 MCG/2ML IJ SOLN
50.0000 ug | INTRAMUSCULAR | Status: AC | PRN
  Administered 2017-06-19: 50 ug via INTRAVENOUS
  Administered 2017-06-19: 25 ug via INTRAVENOUS
  Administered 2017-06-19: 50 ug via INTRAVENOUS
  Administered 2017-06-19: 25 ug via INTRAVENOUS

## 2017-06-19 MED ORDER — OXYCODONE-ACETAMINOPHEN 5-325 MG PO TABS: 1.0000 | ORAL_TABLET | ORAL | 0 refills | Status: DC | PRN

## 2017-06-19 MED ORDER — DEXAMETHASONE SODIUM PHOSPHATE 10 MG/ML IJ SOLN
INTRAMUSCULAR | Status: DC | PRN
  Administered 2017-06-19: 5 mg via INTRAVENOUS

## 2017-06-19 MED ORDER — DEXAMETHASONE SODIUM PHOSPHATE 10 MG/ML IJ SOLN
INTRAMUSCULAR | Status: AC
  Filled 2017-06-19: qty 1

## 2017-06-19 MED ORDER — CHLORHEXIDINE GLUCONATE 4 % EX LIQD: 60.0000 mL | Freq: Once | CUTANEOUS | Status: DC

## 2017-06-19 MED ORDER — PROPOFOL 10 MG/ML IV BOLUS
INTRAVENOUS | Status: DC | PRN
  Administered 2017-06-19: 150 mg via INTRAVENOUS

## 2017-06-19 MED ORDER — MIDAZOLAM HCL 2 MG/2ML IJ SOLN
1.0000 mg | INTRAMUSCULAR | Status: DC | PRN
  Administered 2017-06-19: 2 mg via INTRAVENOUS

## 2017-06-19 MED ORDER — CEFAZOLIN SODIUM-DEXTROSE 2-4 GM/100ML-% IV SOLN
INTRAVENOUS | Status: AC
  Filled 2017-06-19: qty 100

## 2017-06-19 MED ORDER — EPHEDRINE SULFATE 50 MG/ML IJ SOLN
INTRAMUSCULAR | Status: DC | PRN
  Administered 2017-06-19 (×2): 10 mg via INTRAVENOUS

## 2017-06-19 MED ORDER — FENTANYL CITRATE (PF) 100 MCG/2ML IJ SOLN: 25.0000 ug | INTRAMUSCULAR | Status: DC | PRN

## 2017-06-19 MED ORDER — LACTATED RINGERS IV SOLN
INTRAVENOUS | Status: DC
  Administered 2017-06-19: 11:00:00 via INTRAVENOUS

## 2017-06-19 MED ORDER — LIDOCAINE 2% (20 MG/ML) 5 ML SYRINGE
INTRAMUSCULAR | Status: AC
  Filled 2017-06-19: qty 5

## 2017-06-19 MED ORDER — PROPOFOL 10 MG/ML IV BOLUS
INTRAVENOUS | Status: AC
  Filled 2017-06-19: qty 20

## 2017-06-19 MED ORDER — MIDAZOLAM HCL 2 MG/2ML IJ SOLN
INTRAMUSCULAR | Status: AC
  Filled 2017-06-19: qty 2

## 2017-06-19 MED ORDER — CEFAZOLIN SODIUM-DEXTROSE 2-4 GM/100ML-% IV SOLN
2.0000 g | INTRAVENOUS | Status: AC
  Administered 2017-06-19: 2 g via INTRAVENOUS

## 2017-06-19 SURGICAL SUPPLY — 71 items
BANDAGE ACE 3X5.8 VEL STRL LF (GAUZE/BANDAGES/DRESSINGS) IMPLANT
BANDAGE ACE 4X5 VEL STRL LF (GAUZE/BANDAGES/DRESSINGS) ×2 IMPLANT
BENZOIN TINCTURE PRP APPL 2/3 (GAUZE/BANDAGES/DRESSINGS) IMPLANT
BLADE SURG 15 STRL LF DISP TIS (BLADE) ×2 IMPLANT
BLADE SURG 15 STRL SS (BLADE) ×2
BNDG ESMARK 4X9 LF (GAUZE/BANDAGES/DRESSINGS) ×2 IMPLANT
CANISTER SUCT 1200ML W/VALVE (MISCELLANEOUS) IMPLANT
COVER BACK TABLE 60X90IN (DRAPES) ×2 IMPLANT
COVER MAYO STAND STRL (DRAPES) ×2 IMPLANT
CUFF TOURNIQUET SINGLE 18IN (TOURNIQUET CUFF) ×2 IMPLANT
DECANTER SPIKE VIAL GLASS SM (MISCELLANEOUS) ×2 IMPLANT
DRAIN CHANNEL 10M FLAT 3/4 FLT (DRAIN) IMPLANT
DRAIN CHANNEL 15F RND FF W/TCR (WOUND CARE) IMPLANT
DRAIN CHANNEL 7F 3/4 FLAT (WOUND CARE) IMPLANT
DRAPE EXTREMITY T 121X128X90 (DRAPE) ×2 IMPLANT
DRAPE IMP U-DRAPE 54X76 (DRAPES) ×2 IMPLANT
DRAPE OEC MINIVIEW 54X84 (DRAPES) IMPLANT
DRAPE U-SHAPE 47X51 STRL (DRAPES) ×2 IMPLANT
DRSG EMULSION OIL 3X3 NADH (GAUZE/BANDAGES/DRESSINGS) IMPLANT
DRSG PAD ABDOMINAL 8X10 ST (GAUZE/BANDAGES/DRESSINGS) ×2 IMPLANT
DURAPREP 26ML APPLICATOR (WOUND CARE) ×2 IMPLANT
ELECT REM PT RETURN 9FT ADLT (ELECTROSURGICAL) ×2
ELECTRODE REM PT RTRN 9FT ADLT (ELECTROSURGICAL) ×1 IMPLANT
EVACUATOR SILICONE 100CC (DRAIN) ×2 IMPLANT
GAUZE SPONGE 4X4 12PLY STRL (GAUZE/BANDAGES/DRESSINGS) IMPLANT
GLOVE BIOGEL PI IND STRL 7.0 (GLOVE) ×1 IMPLANT
GLOVE BIOGEL PI IND STRL 8 (GLOVE) ×2 IMPLANT
GLOVE BIOGEL PI INDICATOR 7.0 (GLOVE) ×1
GLOVE BIOGEL PI INDICATOR 8 (GLOVE) ×2
GLOVE ECLIPSE 6.5 STRL STRAW (GLOVE) ×2 IMPLANT
GLOVE ECLIPSE 7.5 STRL STRAW (GLOVE) ×4 IMPLANT
GOWN STRL REUS W/ TWL LRG LVL3 (GOWN DISPOSABLE) ×1 IMPLANT
GOWN STRL REUS W/ TWL XL LVL3 (GOWN DISPOSABLE) ×1 IMPLANT
GOWN STRL REUS W/TWL LRG LVL3 (GOWN DISPOSABLE) ×1
GOWN STRL REUS W/TWL XL LVL3 (GOWN DISPOSABLE) ×3 IMPLANT
K-WIRE .045X4 (WIRE) IMPLANT
LOOP VESSEL MAXI BLUE (MISCELLANEOUS) IMPLANT
NEEDLE HYPO 22GX1.5 SAFETY (NEEDLE) ×2 IMPLANT
NS IRRIG 1000ML POUR BTL (IV SOLUTION) ×2 IMPLANT
PACK BASIN DAY SURGERY FS (CUSTOM PROCEDURE TRAY) ×2 IMPLANT
PAD CAST 4YDX4 CTTN HI CHSV (CAST SUPPLIES) ×2 IMPLANT
PADDING CAST ABS 4INX4YD NS (CAST SUPPLIES) ×3
PADDING CAST ABS COTTON 4X4 ST (CAST SUPPLIES) ×3 IMPLANT
PADDING CAST COTTON 4X4 STRL (CAST SUPPLIES) ×2
PENCIL BUTTON HOLSTER BLD 10FT (ELECTRODE) ×2 IMPLANT
SLING ARM FOAM STRAP LRG (SOFTGOODS) ×2 IMPLANT
SLING ARM MED ADULT FOAM STRAP (SOFTGOODS) IMPLANT
SPLINT FAST PLASTER 5X30 (CAST SUPPLIES)
SPLINT FIBERGLASS 4X30 (CAST SUPPLIES) ×2 IMPLANT
SPLINT PLASTER CAST FAST 5X30 (CAST SUPPLIES) IMPLANT
STAPLER VISISTAT 35W (STAPLE) ×2 IMPLANT
STOCKINETTE 4X48 STRL (DRAPES) ×2 IMPLANT
STRIP CLOSURE SKIN 1/2X4 (GAUZE/BANDAGES/DRESSINGS) ×2 IMPLANT
SUCTION FRAZIER HANDLE 10FR (MISCELLANEOUS)
SUCTION TUBE FRAZIER 10FR DISP (MISCELLANEOUS) IMPLANT
SUT ETHILON 3 0 PS 1 (SUTURE) IMPLANT
SUT MNCRL AB 3-0 PS2 18 (SUTURE) IMPLANT
SUT VIC AB 0 CT1 27 (SUTURE)
SUT VIC AB 0 CT1 27XBRD ANBCTR (SUTURE) IMPLANT
SUT VIC AB 2-0 SH 18 (SUTURE) ×4 IMPLANT
SUT VIC AB 3-0 SH 27 (SUTURE)
SUT VIC AB 3-0 SH 27X BRD (SUTURE) IMPLANT
SWAB COLLECTION DEVICE MRSA (MISCELLANEOUS) ×2 IMPLANT
SYR BULB 3OZ (MISCELLANEOUS) ×2 IMPLANT
SYR CONTROL 10ML LL (SYRINGE) ×2 IMPLANT
TOWEL OR 17X24 6PK STRL BLUE (TOWEL DISPOSABLE) ×2 IMPLANT
TOWEL OR NON WOVEN STRL DISP B (DISPOSABLE) ×2 IMPLANT
TUBE ANAEROBIC SPECIMEN COL (MISCELLANEOUS) ×2 IMPLANT
TUBE CONNECTING 20X1/4 (TUBING) IMPLANT
UNDERPAD 30X30 (UNDERPADS AND DIAPERS) ×2 IMPLANT
YANKAUER SUCT BULB TIP NO VENT (SUCTIONS) IMPLANT

## 2017-06-19 NOTE — Discharge Instructions (Signed)
Empty drain every 8 hrs and record amount of fluid drained. Bring this to Dr Darene Lamer office on your follow up appt.   Post Anesthesia Home Care Instructions  Activity: Get plenty of rest for the remainder of the day. A responsible individual must stay with you for 24 hours following the procedure.  For the next 24 hours, DO NOT: -Drive a car -Advertising copywriter -Drink alcoholic beverages -Take any medication unless instructed by your physician -Make any legal decisions or sign important papers.  Meals: Start with liquid foods such as gelatin or soup. Progress to regular foods as tolerated. Avoid greasy, spicy, heavy foods. If nausea and/or vomiting occur, drink only clear liquids until the nausea and/or vomiting subsides. Call your physician if vomiting continues.  Special Instructions/Symptoms: Your throat may feel dry or sore from the anesthesia or the breathing tube placed in your throat during surgery. If this causes discomfort, gargle with warm salt water. The discomfort should disappear within 24 hours.  If you had a scopolamine patch placed behind your ear for the management of post- operative nausea and/or vomiting:  1. The medication in the patch is effective for 72 hours, after which it should be removed.  Wrap patch in a tissue and discard in the trash. Wash hands thoroughly with soap and water. 2. You may remove the patch earlier than 72 hours if you experience unpleasant side effects which may include dry mouth, dizziness or visual disturbances. 3. Avoid touching the patch. Wash your hands with soap and water after contact with the patch.  About my Jackson-Pratt Bulb Drain  What is a Jackson-Pratt bulb? A Jackson-Pratt is a soft, round device used to collect drainage. It is connected to a long, thin drainage catheter, which is held in place by one or two small stiches near your surgical incision site. When the bulb is squeezed, it forms a vacuum, forcing the drainage to empty  into the bulb.  Emptying the Jackson-Pratt bulb- To empty the bulb: 1. Release the plug on the top of the bulb. 2. Pour the bulb's contents into a measuring container which your nurse will provide. 3. Record the time emptied and amount of drainage. Empty the drain(s) as often as your     doctor or nurse recommends.  Date                  Time                    Amount (Drain 1)                 Amount (Drain 2)  _____________________________________________________________________  _____________________________________________________________________  _____________________________________________________________________  _____________________________________________________________________  _____________________________________________________________________  _____________________________________________________________________  _____________________________________________________________________  _____________________________________________________________________  Squeezing the Jackson-Pratt Bulb- To squeeze the bulb: 1. Make sure the plug at the top of the bulb is open. 2. Squeeze the bulb tightly in your fist. You will hear air squeezing from the bulb. 3. Replace the plug while the bulb is squeezed. 4. Use a safety pin to attach the bulb to your clothing. This will keep the catheter from     pulling at the bulb insertion site.  When to call your doctor- Call your doctor if:  Drain site becomes red, swollen or hot.  You have a fever greater than 101 degrees F.  There is oozing at the drain site.  Drain falls out (apply a guaze bandage over the drain hole and secure it with tape).  Drainage increases daily not related  to activity patterns. (You will usually have more drainage when you are active than when you are resting.)  Drainage has a bad odor.

## 2017-06-19 NOTE — Transfer of Care (Signed)
Immediate Anesthesia Transfer of Care Note  Patient: [name]  Procedure(s) Performed: Excision olecranon bursa (Left Elbow)  Patient Location: PACU  Anesthesia Type:General  Level of Consciousness: awake  Airway & Oxygen Therapy: Patient Spontanous Breathing and Patient connected to face mask oxygen  Post-op Assessment: Report given to RN and Post -op Vital signs reviewed and stable  Post vital signs: Reviewed and stable  Last Vitals: There were no vitals filed for this visit.  Last Pain: There were no vitals filed for this visit.       Complications: No apparent anesthesia complications

## 2017-06-19 NOTE — H&P (Signed)
PREOPERATIVE H&P  Chief Complaint: l elbow pain and mass  HPI: [name] is a 61 y.o. female who presents for evaluation of l elbow pain and mass. It has been present for 3 months and has been worsening. She has failed conservative measures. Pain is rated as moderate.  Past Medical History:  Diagnosis Date  . Back pain   . Diabetes mellitus without complication (HCC)   . Hypertension   . Olecranon bursitis of left elbow    Past Surgical History:  Procedure Laterality Date  . ABDOMINAL HYSTERECTOMY    . TIBIA IM NAIL INSERTION Right 06/10/2016   Procedure: INTRAMEDULLARY (IM) NAIL TIBIAL RODING;  Surgeon: Jodi Geralds, MD;  Location: [facility] OR;  Service: Orthopedics;  Laterality: Right;   Social History   Social History  . Marital status: Divorced    Spouse name: N/A  . Number of children: N/A  . Years of education: N/A   Social History Main Topics  . Smoking status: Current Every Day Smoker    Packs/day: 1.00    Types: Cigarettes  . Smokeless tobacco: Never Used  . Alcohol use No     Comment: opccasionally   . Drug use: No  . Sexual activity: Not Asked   Other Topics Concern  . None   Social History Narrative  . None   History reviewed. No pertinent family history. No Known Allergies Prior to Admission medications   Medication Sig Start Date End Date Taking? Authorizing Provider  amLODipine (NORVASC) 5 MG tablet Take 5 mg by mouth at bedtime.    Yes [provider]  aspirin EC 325 MG EC tablet Take 1 tablet (325 mg total) by mouth daily. Take x 1 month post op to decrease risk of blood clots. 06/10/16  Yes Marshia Ly, PA-C  Insulin Degludec-Liraglutide (XULTOPHY) 100-3.6 UNIT-MG/ML SOPN Inject 24 Units into the skin at bedtime.    Yes [provider]  traMADol (ULTRAM) 50 MG tablet Take by mouth every 6 (six) hours as needed.   Yes [provider]  valsartan (DIOVAN) 160 MG tablet Take 160 mg by mouth at bedtime.    Yes [provider]     Positive ROS: none  All other systems have been reviewed and were otherwise negative with the exception of those mentioned in the HPI and as above.  Physical Exam: There were no vitals filed for this visit. There were no vitals filed for this visit. General: Alert, no acute distress Cardiovascular: No pedal edema Respiratory: No cyanosis, no use of accessory musculature GI: No organomegaly, abdomen is soft and non-tender Skin: No lesions in the area of chief complaint Neurologic: Sensation intact distally Psychiatric: Patient is competent for consent with normal mood and affect Lymphatic: No axillary or cervical lymphadenopathy  MUSCULOSKELETAL: l elbow: painful rom sts and fluid in bursaee  Assessment/Plan: LEFT ELBOW OLECRANON BURSITIS Plan for Procedure(s): OLECRANON BURSA  The risks benefits and alternatives were discussed with the patient including but not limited to the risks of nonoperative treatment, versus surgical intervention including infection, bleeding, nerve injury, malunion, nonunion, hardware prominence, hardware failure, need for hardware removal, blood clots, cardiopulmonary complications, morbidity, mortality, among others, and they were willing to proceed.  Predicted outcome is good, although there will be at least a six to nine month expected recovery.  Kelii Chittum L, MD 06/19/2017 12:07 PM

## 2017-06-19 NOTE — Anesthesia Preprocedure Evaluation (Signed)
Anesthesia Evaluation  Patient identified by MRN, date of birth, ID band Patient awake    Reviewed: Allergy & Precautions, H&P , NPO status , Patient's Chart, lab work & pertinent test results, reviewed documented beta blocker date and time   Airway Mallampati: I  TM Distance: >3 FB Neck ROM: Full    Dental no notable dental hx. (+) Edentulous Upper, Edentulous Lower   Pulmonary Current Smoker,    Pulmonary exam normal breath sounds clear to auscultation       Cardiovascular hypertension, Pt. on medications  Rhythm:Regular Rate:Normal     Neuro/Psych negative neurological ROS  negative psych ROS   GI/Hepatic negative GI ROS, Neg liver ROS,   Endo/Other  diabetes, Type 2, Insulin Dependent  Renal/GU negative Renal ROS  negative genitourinary   Musculoskeletal negative musculoskeletal ROS (+)   Abdominal   Peds negative pediatric ROS (+)  Hematology negative hematology ROS (+)   Anesthesia Other Findings   Reproductive/Obstetrics negative OB ROS                             Anesthesia Physical  Anesthesia Plan  ASA: II  Anesthesia Plan: General   Post-op Pain Management:    Induction: Intravenous  PONV Risk Score and Plan: 2 and Ondansetron and Dexamethasone  Airway Management Planned: LMA  Additional Equipment:   Intra-op Plan:   Post-operative Plan: Extubation in OR  Informed Consent: I have reviewed the patients History and Physical, chart, labs and discussed the procedure including the risks, benefits and alternatives for the proposed anesthesia with the patient or authorized representative who has indicated his/her understanding and acceptance.   Dental advisory given  Plan Discussed with: CRNA  Anesthesia Plan Comments:         Anesthesia Quick Evaluation

## 2017-06-19 NOTE — Anesthesia Procedure Notes (Signed)
Procedure Name: LMA Insertion Date/Time: 06/19/2017 12:19 PM Performed by: Caren Macadam Pre-anesthesia Checklist: Patient identified, Emergency Drugs available, Suction available and Patient being monitored Patient Re-evaluated:Patient Re-evaluated prior to induction Oxygen Delivery Method: Circle system utilized Preoxygenation: Pre-oxygenation with 100% oxygen Induction Type: IV induction Ventilation: Mask ventilation without difficulty LMA: LMA inserted LMA Size: 4.0 Number of attempts: 1 Airway Equipment and Method: Bite block Placement Confirmation: positive ETCO2 Tube secured with: Tape Dental Injury: Teeth and Oropharynx as per pre-operative assessment

## 2017-06-20 ENCOUNTER — Encounter (HOSPITAL_BASED_OUTPATIENT_CLINIC_OR_DEPARTMENT_OTHER): Payer: Self-pay | Admitting: Orthopedic Surgery

## 2017-06-20 NOTE — Brief Op Note (Signed)
06/19/2017  8:25 AM  PATIENT:  [name]  61 y.o. female  PRE-OPERATIVE DIAGNOSIS:  LEFT ELBOW OLECRANON BURSITIS  POST-OPERATIVE DIAGNOSIS:  LEFT ELBOW OLECRANON BURSITIS  PROCEDURE:  Procedure(s) with comments: Excision olecranon bursa (Left) - Panel Length: 60 mins   SURGEON:  Surgeon(s) and Role:    Jodi Geralds, MD - Primary  PHYSICIAN ASSISTANT:   ASSISTANTS: bethune   ANESTHESIA:   general  EBL:  Total I/O In: 1300 [I.V.:1300] Out: 37.5 [Drains:12.5; Blood:25]  BLOOD ADMINISTERED:none  DRAINS: none   LOCAL MEDICATIONS USED:  NONE  SPECIMEN:  Source of Specimen:  l elbow  DISPOSITION OF SPECIMEN:  PATHOLOGY  COUNTS:  YES  TOURNIQUET:   Total Tourniquet Time Documented: Upper Arm (Left) - 59 minutes Total: Upper Arm (Left) - 59 minutes   DICTATION: .Other Dictation: Dictation Number [id number]  PLAN OF CARE: Discharge to home after PACU  PATIENT DISPOSITION:  PACU - hemodynamically stable.   Delay start of Pharmacological VTE agent (>24hrs) due to surgical blood loss or risk of bleeding: no

## 2017-06-20 NOTE — Op Note (Signed)
NAME:  [name], [name]                 ACCOUNT NO.:  MEDICAL RECORD NO.:  [id number]  LOCATION:                                 FACILITY:  PHYSICIAN:  Harvie Junior, M.D.   DATE OF BIRTH:  01-31-1956  DATE OF PROCEDURE:  06/19/2017 DATE OF DISCHARGE:                              OPERATIVE REPORT   PREOPERATIVE DIAGNOSIS:  Olecranon bursitis, left elbow.  POSTOPERATIVE DIAGNOSES: 1. Olecranon bursitis, left elbow. 2. Retained foreign body, left elbow.  PROCEDURES: 1. Olecranon bursa excision of left elbow. 2. Removal of deep foreign body, left elbow.  SURGEON:  Harvie Junior, MD.  Threasa HeadsOrma Flaming.  ANESTHESIA:  General.  BRIEF HISTORY:  [name] is a 61 year old female with a long history and significant complaints of left elbow pain.  She had been treated conservatively for a period of time.  She had presented to [place] with a large swollen elbow, having failed prolonged conservative care for olecranon bursitis.  After discussion, she was taken to the operating room for excision of olecranon bursa.  DESCRIPTION OF PROCEDURE:  The patient was taken to the operating room. After adequate anesthesia was obtained with general anesthetic, the patient was placed supine on the operating table.  The left elbow was prepped and draped in usual sterile fashion.  Following this, an incision was made taking out a significant amount of skin as well as the entire bursa and taking out the bursa, we encountered what appeared to be iodoform packing large amount and this was removed along with the bursal attachment.  This was sent to Pathology.  Once this was done, we did have one area where the skin was breached in trying to take out the bursal excision.  We did put a stitch in that.  Once this was done, a flat Blake drain was placed in the wound, and we closed the skin again with attention to try to get the dead space minimize.  A sterile compressive dressing was applied at  this point as well as a posterior splint, and the patient was taken to the recovery where she was noted to be in satisfactory condition.  Estimated blood loss for procedure was minimal.     Harvie Junior, M.D.     Ranae Plumber  D:  06/20/2017  T:  06/20/2017  Job:  [id number]

## 2017-06-21 NOTE — Anesthesia Postprocedure Evaluation (Signed)
Anesthesia Post Note  Patient: [name]  Procedure(s) Performed: Excision olecranon bursa (Left Elbow)     Patient location during evaluation: PACU Anesthesia Type: General Level of consciousness: awake and alert Pain management: pain level controlled Vital Signs Assessment: post-procedure vital signs reviewed and stable Respiratory status: spontaneous breathing, nonlabored ventilation, respiratory function stable and patient connected to nasal cannula oxygen Cardiovascular status: blood pressure returned to baseline and stable Postop Assessment: no apparent nausea or vomiting Anesthetic complications: no    Last Vitals:  Vitals:   06/19/17 1415 06/19/17 1425  BP: (!) 160/69 (!) 170/74  Pulse: 71 71  Resp: 18 18  Temp:  36.5 C  SpO2: 100% 99%    Last Pain:  Vitals:   06/19/17 1415  PainSc: 2                  Stokes Rattigan EDWARD

## 2017-06-24 LAB — AEROBIC/ANAEROBIC CULTURE W GRAM STAIN (SURGICAL/DEEP WOUND)

## 2017-06-24 LAB — AEROBIC/ANAEROBIC CULTURE (SURGICAL/DEEP WOUND): CULTURE: NEGATIVE

## 2017-10-10 ENCOUNTER — Other Ambulatory Visit: Payer: Self-pay | Admitting: Internal Medicine

## 2017-10-10 DIAGNOSIS — Z139 Encounter for screening, unspecified: Secondary | ICD-10-CM

## 2017-11-05 ENCOUNTER — Ambulatory Visit: Payer: Self-pay

## 2017-11-20 ENCOUNTER — Ambulatory Visit
Admission: RE | Admit: 2017-11-20 | Discharge: 2017-11-20 | Disposition: A | Discharge status: Home / Self Care | Payer: BC Managed Care – PPO | Source: Ambulatory Visit | Attending: Internal Medicine | Admitting: Internal Medicine

## 2017-11-20 DIAGNOSIS — Z139 Encounter for screening, unspecified: Secondary | ICD-10-CM

## 2018-04-03 LAB — LIPID PANEL
Cholesterol: 128 (ref 0–200)
HDL: 39 (ref 35–70)
LDL Cholesterol: 72
LDL/HDL RATIO: 1.8
Triglycerides: 84 (ref 40–160)

## 2018-04-03 LAB — HEMOGLOBIN A1C: Hgb A1c MFr Bld: 6.8 — AB (ref 4.0–6.0)

## 2018-04-03 LAB — CBC AND DIFFERENTIAL
HEMATOCRIT: 37 (ref 36–46)
Hemoglobin: 11.3 — AB (ref 12.0–16.0)
WBC: 15

## 2018-04-03 LAB — TSH: TSH: 0.83 (ref 0.41–5.90)

## 2018-04-03 LAB — VITAMIN D 25 HYDROXY (VIT D DEFICIENCY, FRACTURES): VIT D 25 HYDROXY: 20.5

## 2018-06-08 ENCOUNTER — Encounter: Payer: Self-pay | Admitting: Internal Medicine

## 2018-06-08 DIAGNOSIS — I10 Essential (primary) hypertension: Secondary | ICD-10-CM | POA: Insufficient documentation

## 2018-06-08 DIAGNOSIS — G4700 Insomnia, unspecified: Secondary | ICD-10-CM | POA: Insufficient documentation

## 2018-07-03 ENCOUNTER — Ambulatory Visit: Payer: Self-pay | Admitting: Internal Medicine

## 2018-07-18 ENCOUNTER — Other Ambulatory Visit: Payer: Self-pay | Admitting: Nurse Practitioner

## 2018-07-23 ENCOUNTER — Ambulatory Visit: Payer: Self-pay | Admitting: Nurse Practitioner

## 2018-08-19 ENCOUNTER — Other Ambulatory Visit: Payer: Self-pay | Admitting: Internal Medicine

## 2018-08-20 ENCOUNTER — Ambulatory Visit: Payer: Self-pay | Admitting: Nurse Practitioner

## 2018-11-03 ENCOUNTER — Other Ambulatory Visit: Payer: Self-pay | Admitting: Nurse Practitioner

## 2018-11-15 ENCOUNTER — Other Ambulatory Visit: Payer: Self-pay

## 2018-11-15 ENCOUNTER — Emergency Department (HOSPITAL_COMMUNITY): Payer: BC Managed Care – PPO

## 2018-11-15 ENCOUNTER — Encounter (HOSPITAL_COMMUNITY): Payer: Self-pay | Admitting: Emergency Medicine

## 2018-11-15 ENCOUNTER — Emergency Department (HOSPITAL_COMMUNITY)
Admission: EM | Admit: 2018-11-15 | Discharge: 2018-11-15 | Disposition: A | Discharge status: Home / Self Care | Payer: BC Managed Care – PPO | Attending: Emergency Medicine | Admitting: Emergency Medicine

## 2018-11-15 DIAGNOSIS — Y998 Other external cause status: Secondary | ICD-10-CM | POA: Insufficient documentation

## 2018-11-15 DIAGNOSIS — W010XXA Fall on same level from slipping, tripping and stumbling without subsequent striking against object, initial encounter: Secondary | ICD-10-CM | POA: Diagnosis not present

## 2018-11-15 DIAGNOSIS — Z79899 Other long term (current) drug therapy: Secondary | ICD-10-CM | POA: Diagnosis not present

## 2018-11-15 DIAGNOSIS — Y939 Activity, unspecified: Secondary | ICD-10-CM | POA: Diagnosis not present

## 2018-11-15 DIAGNOSIS — Z7982 Long term (current) use of aspirin: Secondary | ICD-10-CM | POA: Insufficient documentation

## 2018-11-15 DIAGNOSIS — S42392A Other fracture of shaft of left humerus, initial encounter for closed fracture: Secondary | ICD-10-CM | POA: Insufficient documentation

## 2018-11-15 DIAGNOSIS — F17210 Nicotine dependence, cigarettes, uncomplicated: Secondary | ICD-10-CM | POA: Insufficient documentation

## 2018-11-15 DIAGNOSIS — S42302A Unspecified fracture of shaft of humerus, left arm, initial encounter for closed fracture: Secondary | ICD-10-CM

## 2018-11-15 DIAGNOSIS — Z794 Long term (current) use of insulin: Secondary | ICD-10-CM | POA: Diagnosis not present

## 2018-11-15 DIAGNOSIS — Y92000 Kitchen of unspecified non-institutional (private) residence as  the place of occurrence of the external cause: Secondary | ICD-10-CM | POA: Insufficient documentation

## 2018-11-15 DIAGNOSIS — I10 Essential (primary) hypertension: Secondary | ICD-10-CM | POA: Diagnosis not present

## 2018-11-15 DIAGNOSIS — S4992XA Unspecified injury of left shoulder and upper arm, initial encounter: Secondary | ICD-10-CM | POA: Diagnosis present

## 2018-11-15 DIAGNOSIS — E119 Type 2 diabetes mellitus without complications: Secondary | ICD-10-CM | POA: Insufficient documentation

## 2018-11-15 DIAGNOSIS — W19XXXA Unspecified fall, initial encounter: Secondary | ICD-10-CM

## 2018-11-15 LAB — CBC
HCT: 36.6 % (ref 36.0–46.0)
Hemoglobin: 10.4 g/dL — ABNORMAL LOW (ref 12.0–15.0)
MCH: 20.6 pg — ABNORMAL LOW (ref 26.0–34.0)
MCHC: 28.4 g/dL — ABNORMAL LOW (ref 30.0–36.0)
MCV: 72.6 fL — AB (ref 80.0–100.0)
Platelets: 211 10*3/uL (ref 150–400)
RBC: 5.04 MIL/uL (ref 3.87–5.11)
RDW: 21.4 % — ABNORMAL HIGH (ref 11.5–15.5)
WBC: 15.3 10*3/uL — ABNORMAL HIGH (ref 4.0–10.5)
nRBC: 0 % (ref 0.0–0.2)

## 2018-11-15 LAB — BASIC METABOLIC PANEL
Anion gap: 8 (ref 5–15)
BUN: 34 mg/dL — ABNORMAL HIGH (ref 8–23)
CHLORIDE: 110 mmol/L (ref 98–111)
CO2: 22 mmol/L (ref 22–32)
Calcium: 8.8 mg/dL — ABNORMAL LOW (ref 8.9–10.3)
Creatinine, Ser: 0.79 mg/dL (ref 0.44–1.00)
GFR calc Af Amer: >60 mL/min (ref >60)
GFR calc non Af Amer: >60 mL/min (ref >60)
Glucose, Bld: 87 mg/dL (ref 70–99)
Potassium: 3.9 mmol/L (ref 3.5–5.1)
Sodium: 140 mmol/L (ref 135–145)

## 2018-11-15 MED ORDER — ACETAMINOPHEN 500 MG PO TABS
1000.0000 mg | ORAL_TABLET | Freq: Once | ORAL | Status: AC
  Administered 2018-11-15: 1000 mg via ORAL
  Filled 2018-11-15: qty 2

## 2018-11-15 MED ORDER — MORPHINE SULFATE 15 MG PO TABS: 7.5000 mg | ORAL_TABLET | Freq: Four times a day (QID) | ORAL | 0 refills | Status: AC | PRN

## 2018-11-15 MED ORDER — ONDANSETRON HCL 4 MG/2ML IJ SOLN
4.0000 mg | Freq: Once | INTRAMUSCULAR | Status: AC
  Administered 2018-11-15: 4 mg via INTRAVENOUS
  Filled 2018-11-15: qty 2

## 2018-11-15 MED ORDER — FENTANYL CITRATE (PF) 100 MCG/2ML IJ SOLN
50.0000 ug | Freq: Once | INTRAMUSCULAR | Status: AC
  Administered 2018-11-15: 50 ug via INTRAVENOUS
  Filled 2018-11-15: qty 2

## 2018-11-15 MED ORDER — MORPHINE SULFATE (PF) 4 MG/ML IV SOLN
4.0000 mg | Freq: Once | INTRAVENOUS | Status: AC
  Administered 2018-11-15: 4 mg via INTRAVENOUS
  Filled 2018-11-15: qty 1

## 2018-11-15 MED ORDER — AMLODIPINE BESYLATE 5 MG PO TABS
5.0000 mg | ORAL_TABLET | Freq: Once | ORAL | Status: AC
  Administered 2018-11-15: 5 mg via ORAL
  Filled 2018-11-15: qty 1

## 2018-11-15 NOTE — ED Notes (Signed)
Patient transported to CT 

## 2018-11-15 NOTE — Progress Notes (Signed)
Orthopedic Tech Progress Note Patient Details:  [name] 06-17-1956 [id number]  Ortho Devices Type of Ortho Device: Coapt, Sling immobilizer Ortho Device/Splint Interventions: Adjustment, Application, Ordered   Post Interventions Patient Tolerated: Well Instructions Provided: Care of device, Adjustment of device   Norva Karvonen T 11/15/2018, 7:48 PM

## 2018-11-15 NOTE — ED Provider Notes (Signed)
[facility] EMERGENCY DEPARTMENT Provider Note   CSN: [id number] Arrival date & time: 11/15/18  1446    History   Chief Complaint No chief complaint on file.   HPI CATHE KOLLMANN is a 63 y.o. female.     The history is provided by the patient.  Fall  This is a new problem. The current episode started 1 to 2 hours ago. The problem occurs rarely. The problem has not changed since onset.Pertinent negatives include no chest pain, no abdominal pain, no headaches and no shortness of breath. Nothing aggravates the symptoms. Nothing relieves the symptoms. She has tried nothing for the symptoms. The treatment provided no relief.    Past Medical History:  Diagnosis Date  . Back pain   . Diabetes mellitus without complication (HCC)   . Hypertension   . Olecranon bursitis of left elbow     Patient Active Problem List   Diagnosis Date Noted  . Insomnia 06/08/2018  . Essential hypertension 06/08/2018  . Olecranon bursitis, left elbow 06/19/2017  . Closed fracture of distal end of right fibula and tibia 06/10/2016  . Diabetes (HCC) 06/10/2016  . Traumatic closed displaced fracture of shaft of right tibia and fibula 06/10/2016    Past Surgical History:  Procedure Laterality Date  . ABDOMINAL HYSTERECTOMY    . OLECRANON BURSECTOMY Left 06/19/2017   Procedure: Excision olecranon bursa;  Surgeon: Jodi Geralds, MD;  Location: [facility];  Service: Orthopedics;  Laterality: Left;  Panel Length: 60 mins   . TIBIA IM NAIL INSERTION Right 06/10/2016   Procedure: INTRAMEDULLARY (IM) NAIL TIBIAL RODING;  Surgeon: Jodi Geralds, MD;  Location: [facility] OR;  Service: Orthopedics;  Laterality: Right;     OB History   No obstetric history on file.      Home Medications    Prior to Admission medications   Medication Sig Start Date End Date Taking? Authorizing Provider  amLODipine (NORVASC) 5 MG tablet TAKE 1 TABLET BY MOUTH ONCE DAILY 08/19/18   Arnette Felts,  FNP  aspirin EC 325 MG EC tablet Take 1 tablet (325 mg total) by mouth daily. Take x 1 month post op to decrease risk of blood clots. 06/10/16   Marshia Ly, PA-C  Insulin Degludec-Liraglutide (XULTOPHY) 100-3.6 UNIT-MG/ML SOPN Inject 24 Units into the skin at bedtime.     [provider]  morphine (MSIR) 15 MG tablet Take 0.5 tablets (7.5 mg total) by mouth every 6 (six) hours as needed for up to 10 days for severe pain. 11/15/18 11/25/18  Dahlia Client, MD  oxyCODONE-acetaminophen (PERCOCET/ROXICET) 5-325 MG tablet Take 1-2 tablets by mouth every 4 (four) hours as needed for severe pain. 06/19/17   Marshia Ly, PA-C  valsartan (DIOVAN) 160 MG tablet Take 1 tablet (160 mg total) by mouth daily. PATIENT NEEDS AN APPOINTMENT 11/04/18   Arnette Felts, FNP    Family History No family history on file.  Social History Social History   Tobacco Use  . Smoking status: Current Every Day Smoker    Packs/day: 1.00    Types: Cigarettes  . Smokeless tobacco: Never Used  Substance Use Topics  . Alcohol use: No    Comment: opccasionally   . Drug use: No     Allergies   Patient has no known allergies.   Review of Systems Review of Systems  Constitutional: Negative for chills and fever.  HENT: Negative for ear pain and sore throat.   Eyes: Negative for pain and visual disturbance.  Respiratory: Negative for cough and shortness of breath.   Cardiovascular: Negative for chest pain and palpitations.  Gastrointestinal: Negative for abdominal pain and vomiting.  Genitourinary: Negative for dysuria and hematuria.  Musculoskeletal: Negative for arthralgias and back pain.  Skin: Negative for color change and rash.  Neurological: Negative for seizures, syncope and headaches.  All other systems reviewed and are negative.    Physical Exam Updated Vital Signs BP (!) 150/96 (BP Location: Right Arm)   Pulse 86   Temp 97.6 F (36.4 C) (Oral)   Resp 18   Ht  (1.702 m)   Wt 87.5  kg   SpO2 99%   BMI 30.23 kg/m   Physical Exam Vitals signs and nursing note reviewed.  Constitutional:      General: She is not in acute distress.    Appearance: She is well-developed.     Comments: Patient resting comfortably, no acute distress.  HENT:     Head: Normocephalic and atraumatic.  Eyes:     Conjunctiva/sclera: Conjunctivae normal.  Neck:     Musculoskeletal: Neck supple.  Cardiovascular:     Rate and Rhythm: Normal rate and regular rhythm.     Heart sounds: No murmur.  Pulmonary:     Effort: Pulmonary effort is normal. No respiratory distress.     Breath sounds: Normal breath sounds.  Abdominal:     Palpations: Abdomen is soft.     Tenderness: There is no abdominal tenderness.  Musculoskeletal: Normal range of motion.        General: Tenderness present.     Comments: Patient has tenderness to the left shoulder, range of motion decreased due to pain.  No open fracture no obvious deformity.  Neurovascular intact distally.  No spinal tenderness in the C, T, L-spine.  Skin:    General: Skin is warm and dry.     Capillary Refill: Capillary refill takes less than 2 seconds.  Neurological:     General: No focal deficit present.     Mental Status: She is alert and oriented to person, place, and time. Mental status is at baseline.     Cranial Nerves: No cranial nerve deficit.     Sensory: No sensory deficit.     Motor: No weakness.     Coordination: Coordination normal.     Gait: Gait normal.     Deep Tendon Reflexes: Reflexes normal.  Psychiatric:        Mood and Affect: Mood normal.      ED Treatments / Results  Labs (all labs ordered are listed, but only abnormal results are displayed) Labs Reviewed  CBC - Abnormal; Notable for the following components:      Result Value   WBC 15.3 (*)    Hemoglobin 10.4 (*)    MCV 72.6 (*)    MCH 20.6 (*)    MCHC 28.4 (*)    RDW 21.4 (*)    All other components within normal limits  BASIC METABOLIC PANEL -  Abnormal; Notable for the following components:   BUN 34 (*)    Calcium 8.8 (*)    All other components within normal limits    EKG None  Radiology Dg Chest 1 View  Result Date: 11/15/2018 CLINICAL DATA:  Pain after fall EXAM: CHEST  1 VIEW COMPARISON:  June 10, 2016 FINDINGS: Stable cardiomegaly. The hila, mediastinum, lungs, and pleura are otherwise unremarkable. IMPRESSION: No acute abnormalities. Electronically Signed   By: Gerome Sam III M.D  On: 11/15/2018 17:09   Dg Pelvis 1-2 Views  Result Date: 11/15/2018 CLINICAL DATA:  Pain after trauma EXAM: PELVIS - 1-2 VIEW COMPARISON:  None. FINDINGS: There is no evidence of pelvic fracture or diastasis. No pelvic bone lesions are seen. IMPRESSION: Negative. Electronically Signed   By: Gerome Sam III M.D   On: 11/15/2018 17:08   Dg Shoulder 1v Left  Result Date: 11/15/2018 CLINICAL DATA:  Fall, left arm pain EXAM: LEFT SHOULDER - 1 VIEW COMPARISON:  None. FINDINGS: Single view demonstrates normal AP alignment of the left shoulder. Mild degenerative changes in the left AC joint. There is a transverse fracture through the midshaft of the left humerus. IMPRESSION: Transverse left mid humeral fracture. No visible acute bony abnormality in the shoulder on this limited study. Electronically Signed   By: Charlett Nose M.D.   On: 11/15/2018 17:07   Ct Head Wo Contrast  Result Date: 11/15/2018 CLINICAL DATA:  Fall, neck pain EXAM: CT HEAD WITHOUT CONTRAST CT CERVICAL SPINE WITHOUT CONTRAST TECHNIQUE: Multidetector CT imaging of the head and cervical spine was performed following the standard protocol without intravenous contrast. Multiplanar CT image reconstructions of the cervical spine were also generated. COMPARISON:  None. FINDINGS: CT HEAD FINDINGS Brain: No evidence of acute infarction, hemorrhage, hydrocephalus, extra-axial collection or mass lesion/mass effect. Vascular: No hyperdense vessel or unexpected calcification. Skull: Normal.  Negative for fracture or focal lesion. Sinuses/Orbits: No acute finding. Other: None. CT CERVICAL SPINE FINDINGS Alignment: Normal. Skull base and vertebrae: No acute fracture. No primary bone lesion or focal pathologic process. Soft tissues and spinal canal: No prevertebral fluid or swelling. No visible canal hematoma. Disc levels:  Disc spaces are well preserved. Upper chest: Negative. Other: None. IMPRESSION: 1.  No acute intracranial pathology. 2.  No fracture or static subluxation of the cervical spine. Electronically Signed   By: Lauralyn Primes M.D.   On: 11/15/2018 16:58   Ct Cervical Spine Wo Contrast  Result Date: 11/15/2018 CLINICAL DATA:  Fall, neck pain EXAM: CT HEAD WITHOUT CONTRAST CT CERVICAL SPINE WITHOUT CONTRAST TECHNIQUE: Multidetector CT imaging of the head and cervical spine was performed following the standard protocol without intravenous contrast. Multiplanar CT image reconstructions of the cervical spine were also generated. COMPARISON:  None. FINDINGS: CT HEAD FINDINGS Brain: No evidence of acute infarction, hemorrhage, hydrocephalus, extra-axial collection or mass lesion/mass effect. Vascular: No hyperdense vessel or unexpected calcification. Skull: Normal. Negative for fracture or focal lesion. Sinuses/Orbits: No acute finding. Other: None. CT CERVICAL SPINE FINDINGS Alignment: Normal. Skull base and vertebrae: No acute fracture. No primary bone lesion or focal pathologic process. Soft tissues and spinal canal: No prevertebral fluid or swelling. No visible canal hematoma. Disc levels:  Disc spaces are well preserved. Upper chest: Negative. Other: None. IMPRESSION: 1.  No acute intracranial pathology. 2.  No fracture or static subluxation of the cervical spine. Electronically Signed   By: Lauralyn Primes M.D.   On: 11/15/2018 16:58   Dg Humerus Left  Result Date: 11/15/2018 CLINICAL DATA:  Fall, left arm pain EXAM: LEFT HUMERUS - 2+ VIEW COMPARISON:  None. FINDINGS: There is a transverse  fracture through the midshaft of the left humerus. Mild displacement and angulation. No visible shoulder or elbow abnormality. IMPRESSION: Displaced, angulated mid left humeral fracture. Electronically Signed   By: Charlett Nose M.D.   On: 11/15/2018 17:07    Procedures Procedures (including critical care time)  Medications Ordered in ED Medications  morphine 4 MG/ML injection 4 mg (4  mg Intravenous Given 11/15/18 1522)  acetaminophen (TYLENOL) tablet 1,000 mg (1,000 mg Oral Given 11/15/18 1522)  amLODipine (NORVASC) tablet 5 mg (5 mg Oral Given 11/15/18 1521)  morphine 4 MG/ML injection 4 mg (4 mg Intravenous Given 11/15/18 1731)  fentaNYL (SUBLIMAZE) injection 50 mcg (50 mcg Intravenous Given 11/15/18 1937)  ondansetron (ZOFRAN) injection 4 mg (4 mg Intravenous Given 11/15/18 2007)     Initial Impression / Assessment and Plan / ED Course  I have reviewed the triage vital signs and the nursing notes.  Pertinent labs & imaging results that were available during my care of the patient were reviewed by me and considered in my medical decision making (see chart for details).        63 year old female significant past medical history listed above, no blood thinners who had a mechanical fall for which she landed on her left shoulder with pain at that time.  Patient denies any other injury, denies loss of consciousness, denies palpitations or infectious-like symptoms.  Patient indicates that she fell over flip-flops.  Physical exam positive for pain on range of motion and palpation of the left shoulder.  Will obtain x-rays of the shoulder, chest x-ray as well as pelvis.  Patient in c-collar, will obtain CT head, CT C-spine.  Basic laboratory studies and EKG.  EKG similar to baseline, no signs of ischemia.  Patient is hypertensive in the emergency department for which she has a previous diagnosis and is currently on Norvasc, will give her a dose here in the emergency department.  Laboratory studies  appropriate, no acute intervention needed.  CT scans negative for any acute injury.  X-ray indicates   Humerus fracture on the left side, midshaft.  Discussed with orthopedics who recommends splint and follow-up with their clinic.  Patient is placed in splint, pain medicine given here in the emergency department.  Digital narcotic prescription not working, will print prescription.  Patient discharged with appropriate follow-up instructions with Dr. Earlene Plater with orthopedics.  Patient given sling and appropriate return precautions.  Patient neurovascularly intact at time of discharge.  Patient given work note.  Patient and family agreement with this plan.  The above care was discussed and agreed upon by my attending physician.  Final Clinical Impressions(s) / ED Diagnoses   Final diagnoses:  Closed fracture of shaft of left humerus, unspecified fracture morphology, initial encounter    ED Discharge Orders         Ordered    morphine (MSIR) 15 MG tablet  Every 6 hours PRN     11/15/18 2010           Dahlia Client, MD 11/16/18 0159    Blane Ohara, MD 11/17/18 [phone]

## 2018-11-15 NOTE — ED Notes (Signed)
PT states understanding of care given, follow up care, and medication prescribed. PT ambulated from ED to car with a steady gait. 

## 2018-11-15 NOTE — ED Triage Notes (Addendum)
[facility]- pt had a mechanical fall. Pt was wearing flip flops in her house and slipped in the kitchen on the stove. Pt complains of left shoulder pain. No LOC, neck and back pain. EMS gave 200 mcg of fentanyl.   200/109 60 HR 99% 106 CBG 20 RR

## 2018-11-18 ENCOUNTER — Encounter (INDEPENDENT_AMBULATORY_CARE_PROVIDER_SITE_OTHER): Payer: Self-pay | Admitting: Orthopaedic Surgery

## 2018-11-18 ENCOUNTER — Ambulatory Visit (INDEPENDENT_AMBULATORY_CARE_PROVIDER_SITE_OTHER): Payer: Self-pay | Admitting: Orthopaedic Surgery

## 2018-11-18 DIAGNOSIS — S42332A Displaced oblique fracture of shaft of humerus, left arm, initial encounter for closed fracture: Secondary | ICD-10-CM

## 2018-11-18 MED ORDER — TIZANIDINE HCL 4 MG PO TABS: 4.0000 mg | ORAL_TABLET | Freq: Three times a day (TID) | ORAL | 0 refills | Status: DC | PRN

## 2018-11-18 MED ORDER — OXYCODONE HCL 5 MG PO TABS: 5.0000 mg | ORAL_TABLET | Freq: Four times a day (QID) | ORAL | 0 refills | Status: DC | PRN

## 2018-11-18 NOTE — Progress Notes (Signed)
Office Visit Note   Patient: [name]           Date of Birth: 05/25/1956           MRN: [id number] Visit Date: 11/18/2018              Requested by: Dorothyann Peng, MD [address] [place], [place] [address] PCP: Dorothyann Peng, MD   Assessment & Plan: Visit Diagnoses:  1. Closed displaced oblique fracture of shaft of left humerus, initial encounter     Plan: Although she is quite uncomfortable, she does wish to pursue at least a short-term try at functional fracture bracing as opposed to surgery.  I will send her to [employer] today for a [employer]  functional fracture brace.  I will send in some oxycodone and Zanaflex to her pharmacy as well.  I will have Rexene Edison, PA-C see her next week for 2 views of her left humerus in the [place] brace.  If she is comfortable we will continue bracing.  If this does not help and she is uncomfortable surgery can be recommended and set up for likely Tuesday, March 24 unless 1 of my partners can address it earlier.   All questions concerns were answered and addressed.  Follow-Up Instructions: Return in about 1 week (around 11/25/2018).   Orders:  No orders of the defined types were placed in this encounter.  Meds ordered this encounter  Medications  . oxyCODONE (ROXICODONE) 5 MG immediate release tablet    Sig: Take 1-2 tablets (5-10 mg total) by mouth every 6 (six) hours as needed for severe pain.    Dispense:  30 tablet    Refill:  0  . tiZANidine (ZANAFLEX) 4 MG tablet    Sig: Take 1 tablet (4 mg total) by mouth every 8 (eight) hours as needed for muscle spasms.    Dispense:  30 tablet    Refill:  0      Procedures: No procedures performed   Clinical Data: No additional findings.   Subjective: Chief Complaint  Patient presents with  . Right Arm - Fracture  The patient is a very pleasant 63 year old female referred to the emergency room this past weekend after mechanical fall.  She was found to have a humeral shaft fracture and placed in a coaptation splint and a sling.  She does report left arm pain.  She is right-hand dominant.  She is a diabetic but reports good control.  She reports swelling in her left hand as well and difficulty sleeping.  This fall that she had was accidental.  She is actually a patient of another practice in town but they called our group and said since we were on call and now she is here.  She said she is fine with [place] seeing her today as well.  Apparently I have seen her in the past.  She denies any other active medical problems and again reports good diabetic control.  HPI  Review of Systems She currently denies any headache, chest pain, shortness of breath, fever, chills, nausea, vomiting  Objective: Vital Signs: There were no vitals taken for this visit.  Physical Exam Patient is alert and oriented x3 in no acute distress Ortho Exam Examination of her left upper extremity shows the splint is intact.  Her hand is swollen on the left side but  neurovascularly intact.  X-rays from emergency room reviewed of her humerus shows and shows a mid shaft oblique Specialty Comments:  No specialty comments available.  Imaging: No results found. Humeral shaft fracture with some osteopenic bone.  PMFS History: Patient Active Problem List   Diagnosis Date Noted  . Insomnia 06/08/2018  . Essential hypertension 06/08/2018  . Olecranon bursitis, left elbow 06/19/2017  . Closed fracture of distal end of right fibula and tibia 06/10/2016  . Diabetes (HCC) 06/10/2016  . Traumatic closed displaced fracture of shaft of right tibia and fibula 06/10/2016   Past Medical History:  Diagnosis Date  . Back pain   . Diabetes mellitus without complication (HCC)   . Hypertension   . Olecranon bursitis of left elbow     History reviewed. No pertinent family history.  Past Surgical History:  Procedure Laterality Date  . ABDOMINAL HYSTERECTOMY    .  OLECRANON BURSECTOMY Left 06/19/2017   Procedure: Excision olecranon bursa;  Surgeon: Jodi Geralds, MD;  Location: [facility];  Service: Orthopedics;  Laterality: Left;  Panel Length: 60 mins   . TIBIA IM NAIL INSERTION Right 06/10/2016   Procedure: INTRAMEDULLARY (IM) NAIL TIBIAL RODING;  Surgeon: Jodi Geralds, MD;  Location: [facility];  Service: Orthopedics;  Laterality: Right;   Social History   Occupational History  . Not on file  Tobacco Use  . Smoking status: Current Every Day Smoker    Packs/day: 1.00    Types: Cigarettes  . Smokeless tobacco: Never Used  Substance and Sexual Activity  . Alcohol use: No    Comment: opccasionally   . Drug use: No  . Sexual activity: Not on file

## 2018-11-19 ENCOUNTER — Ambulatory Visit: Payer: Self-pay | Admitting: Nurse Practitioner

## 2018-11-19 ENCOUNTER — Other Ambulatory Visit: Payer: Self-pay | Admitting: Nurse Practitioner

## 2018-11-19 ENCOUNTER — Telehealth: Payer: Self-pay | Admitting: Nurse Practitioner

## 2018-11-19 DIAGNOSIS — S42332A Displaced oblique fracture of shaft of humerus, left arm, initial encounter for closed fracture: Secondary | ICD-10-CM

## 2018-11-19 MED ORDER — AMLODIPINE BESYLATE 5 MG PO TABS: 5.0000 mg | ORAL_TABLET | Freq: Every day | ORAL | 0 refills | Status: DC

## 2018-11-19 NOTE — Telephone Encounter (Signed)
Called patient and made aware I have placed an order for a bone density. She reports she was unable to walk today. I sent a 30 day supply of amlodipine and she is to follow up in April.

## 2018-11-21 ENCOUNTER — Ambulatory Visit: Payer: Self-pay | Admitting: Nurse Practitioner

## 2018-11-24 ENCOUNTER — Telehealth (INDEPENDENT_AMBULATORY_CARE_PROVIDER_SITE_OTHER): Payer: Self-pay | Admitting: Orthopaedic Surgery

## 2018-11-24 NOTE — Telephone Encounter (Signed)
Cannot write her for a wheelchair nothing documented to indicate need for it.

## 2018-11-24 NOTE — Telephone Encounter (Signed)
Do you want me to write Rx for wheelchair? We saw her for her arm

## 2018-11-24 NOTE — Telephone Encounter (Signed)
LMOM for patient of the below message  

## 2018-11-24 NOTE — Telephone Encounter (Signed)
Patient called stating she need a wheelchair as soon as possible. Patient said it is hard to walk. The number to contact patient is [phone]

## 2018-11-25 ENCOUNTER — Ambulatory Visit (INDEPENDENT_AMBULATORY_CARE_PROVIDER_SITE_OTHER): Payer: BC Managed Care – PPO | Admitting: Physician Assistant

## 2018-11-25 ENCOUNTER — Other Ambulatory Visit: Payer: Self-pay

## 2018-11-25 ENCOUNTER — Ambulatory Visit (INDEPENDENT_AMBULATORY_CARE_PROVIDER_SITE_OTHER): Payer: Self-pay

## 2018-11-25 ENCOUNTER — Encounter (INDEPENDENT_AMBULATORY_CARE_PROVIDER_SITE_OTHER): Payer: Self-pay | Admitting: Physician Assistant

## 2018-11-25 DIAGNOSIS — S42332D Displaced oblique fracture of shaft of humerus, left arm, subsequent encounter for fracture with routine healing: Secondary | ICD-10-CM

## 2018-11-25 NOTE — Progress Notes (Signed)
HPI: [name] is here today for her left midshaft humerus fracture.  She is in a Sarmiento brace.  She is having considerable amount of a but feels overall she is trending towards improvement.  She states she does not want surgery and would like him continue with conservative treatment if possible.  Physical exam: Left humerus Sarmiento brace fits well.  Good range of motion the elbow.  Full range of motion forearm without pain.  Sensation grossly intact left hand.  Slight edema of the left hand compared to the right.   Radiographs: Left humerus multiple views show overall improved alignment.  No significant signs of callus formation at the mid shaft fracture.  Shoulders well located elbow is well located.  Impression: Left humerus midshaft fracture.  Plan: She will continue to use sling for comfort.  She commended Sarmiento brace for bathing only.  She will work on gentle range of motion elbow wrist and hand.  Advised her to get a squeeze ball to help with the edema in the hand.  Follow-up with [place] in 4 weeks at that time we will obtain 2 views of the left humerus.  Questions encouraged and answered at length

## 2018-11-27 ENCOUNTER — Telehealth (INDEPENDENT_AMBULATORY_CARE_PROVIDER_SITE_OTHER): Payer: Self-pay | Admitting: Orthopaedic Surgery

## 2018-11-27 NOTE — Telephone Encounter (Signed)
Pt is calling asking about her wheelchair.

## 2018-11-27 NOTE — Telephone Encounter (Signed)
LMOM for patient letting her know I have filled out all the info for the wheelchair that I could, unsure what the hold up is?

## 2018-12-01 NOTE — Telephone Encounter (Signed)
Re faxed Rx to [facility]

## 2018-12-01 NOTE — Telephone Encounter (Signed)
Patient called back stating that [employer] is advising her that they need to know exactly what type of wheelchair the patient is needing.  She said that it was some type of code they need.  Thank you.

## 2018-12-03 ENCOUNTER — Ambulatory Visit: Payer: Self-pay | Admitting: Nurse Practitioner

## 2018-12-04 ENCOUNTER — Other Ambulatory Visit: Payer: Self-pay

## 2018-12-04 MED ORDER — INSULIN DEGLUDEC-LIRAGLUTIDE 100-3.6 UNIT-MG/ML ~~LOC~~ SOPN: 24.0000 [IU] | PEN_INJECTOR | Freq: Every day | SUBCUTANEOUS | 3 refills | Status: DC

## 2018-12-04 MED ORDER — TIZANIDINE HCL 4 MG PO TABS: 4.0000 mg | ORAL_TABLET | Freq: Three times a day (TID) | ORAL | 0 refills | Status: DC | PRN

## 2018-12-04 MED ORDER — AMLODIPINE BESYLATE 5 MG PO TABS: 5.0000 mg | ORAL_TABLET | Freq: Every day | ORAL | 0 refills | Status: DC

## 2018-12-04 MED ORDER — VALSARTAN 160 MG PO TABS: 160.0000 mg | ORAL_TABLET | Freq: Every day | ORAL | 0 refills | Status: DC

## 2018-12-17 ENCOUNTER — Encounter: Payer: Self-pay | Admitting: Nurse Practitioner

## 2018-12-17 ENCOUNTER — Ambulatory Visit: Payer: BC Managed Care – PPO | Admitting: Nurse Practitioner

## 2018-12-17 ENCOUNTER — Other Ambulatory Visit: Payer: Self-pay | Admitting: Nurse Practitioner

## 2018-12-17 ENCOUNTER — Telehealth: Payer: Self-pay

## 2018-12-17 ENCOUNTER — Other Ambulatory Visit: Payer: Self-pay

## 2018-12-17 VITALS — BP 128/58 | HR 66 | Temp 98.4°F | Ht 63.2 in | Wt 186.6 lb

## 2018-12-17 DIAGNOSIS — Z716 Tobacco abuse counseling: Secondary | ICD-10-CM | POA: Diagnosis not present

## 2018-12-17 DIAGNOSIS — I10 Essential (primary) hypertension: Secondary | ICD-10-CM

## 2018-12-17 DIAGNOSIS — E119 Type 2 diabetes mellitus without complications: Secondary | ICD-10-CM | POA: Diagnosis not present

## 2018-12-17 DIAGNOSIS — E2839 Other primary ovarian failure: Secondary | ICD-10-CM

## 2018-12-17 DIAGNOSIS — S42332A Displaced oblique fracture of shaft of humerus, left arm, initial encounter for closed fracture: Secondary | ICD-10-CM | POA: Diagnosis not present

## 2018-12-17 DIAGNOSIS — W19XXXA Unspecified fall, initial encounter: Secondary | ICD-10-CM

## 2018-12-17 MED ORDER — VARENICLINE TARTRATE 0.5 MG X 11 & 1 MG X 42 PO MISC: ORAL | 0 refills | Status: DC

## 2018-12-17 MED ORDER — DICLOFENAC SODIUM 1 % TD GEL: 2.0000 g | Freq: Four times a day (QID) | TRANSDERMAL | 1 refills | Status: DC

## 2018-12-17 NOTE — Progress Notes (Signed)
Subjective:     Patient ID: [name] , female    DOB: 10/18/1955 , 63 y.o.   MRN: [id number]   Chief Complaint  Patient presents with  . Diabetes    HPI  She is taking 16 units per day of Xultophy.  Blood sugars are 106-120.   Broke her humerus bone to left arm done on March 7th seen orthopedic on March 17th.  She is having pain and feels this is causing her not to be able to sleep.    Diabetes  She presents for her follow-up diabetic visit. She has type 2 diabetes mellitus. Her disease course has been worsening. There are no hypoglycemic associated symptoms. Pertinent negatives for diabetes include no blurred vision, no fatigue, no polydipsia, no polyphagia and no polyuria. There are no hypoglycemic complications. Symptoms are worsening. There are no diabetic complications. Risk factors for coronary artery disease include sedentary lifestyle and obesity. She is following a generally healthy diet. When asked about meal planning, she reported none. She has not had a previous visit with a dietitian. She rarely participates in exercise. An ACE inhibitor/angiotensin II receptor blocker is not being taken. Eye exam is not current.     Past Medical History:  Diagnosis Date  . Back pain   . Diabetes mellitus without complication (HCC)   . Hypertension   . Olecranon bursitis of left elbow      Family History  Problem Relation Age of Onset  . Diabetes Mother   . Hypertension Mother   . Diabetes Father   . Hypertension Father      Current Outpatient Medications:  .  amLODipine (NORVASC) 5 MG tablet, Take 1 tablet (5 mg total) by mouth daily., Disp: 30 tablet, Rfl: 0 .  aspirin EC 325 MG EC tablet, Take 1 tablet (325 mg total) by mouth daily. Take x 1 month post op to decrease risk of blood clots., Disp: 30 tablet, Rfl: 0 .  Insulin Degludec-Liraglutide (XULTOPHY) 100-3.6 UNIT-MG/ML SOPN, Inject 24 Units into the skin at bedtime., Disp: 5 pen, Rfl: 3 .  tiZANidine (ZANAFLEX) 4  MG tablet, Take 1 tablet (4 mg total) by mouth every 8 (eight) hours as needed for muscle spasms., Disp: 30 tablet, Rfl: 0 .  valsartan (DIOVAN) 160 MG tablet, Take 1 tablet (160 mg total) by mouth daily. PATIENT NEEDS AN APPOINTMENT, Disp: 30 tablet, Rfl: 0 .  oxyCODONE (ROXICODONE) 5 MG immediate release tablet, Take 1-2 tablets (5-10 mg total) by mouth every 6 (six) hours as needed for severe pain. (Patient not taking: Reported on 12/17/2018), Disp: 30 tablet, Rfl: 0 .  oxyCODONE-acetaminophen (PERCOCET/ROXICET) 5-325 MG tablet, Take 1-2 tablets by mouth every 4 (four) hours as needed for severe pain. (Patient not taking: Reported on 12/17/2018), Disp: 30 tablet, Rfl: 0   No Known Allergies   Review of Systems  Constitutional: Negative for fatigue and fever.  Eyes: Negative for blurred vision.  Cardiovascular: Negative.  Negative for palpitations and leg swelling.  Endocrine: Negative for polydipsia, polyphagia and polyuria.     Today's Vitals   12/17/18 1534  BP: (!) 128/58  Pulse: 66  Temp: 98.4 F (36.9 C)  TempSrc: Oral  SpO2: 98%  Weight: 186 lb 9.6 oz (84.6 kg)  Height: 5' 3.2" (1.605 m)   Body mass index is 32.85 kg/m.   Objective:  Physical Exam Vitals signs reviewed.  Constitutional:      Appearance: Normal appearance. She is well-developed.  Neck:  Musculoskeletal: Normal range of motion and neck supple.  Cardiovascular:     Rate and Rhythm: Normal rate and regular rhythm.     Pulses: Normal pulses.     Heart sounds: Normal heart sounds. No murmur.  Pulmonary:     Effort: Pulmonary effort is normal. No respiratory distress.     Breath sounds: Normal breath sounds.  Chest:     Chest wall: No tenderness.  Musculoskeletal: Normal range of motion.        General: Tenderness (left shoulder) present.     Comments: Fractured humerus to left arm  Skin:    General: Skin is warm and dry.     Capillary Refill: Capillary refill takes less than 2 seconds.   Neurological:     General: No focal deficit present.     Mental Status: She is alert and oriented to person, place, and time.  Psychiatric:        Mood and Affect: Mood normal.        Behavior: Behavior normal.        Thought Content: Thought content normal.        Judgment: Judgment normal.         Assessment And Plan:     1. Type 2 diabetes mellitus without complication, without long-term current use of insulin (HCC)  Chronic, poorly controlled  She has not been tot he office in more than 8 months  Continue with current medications  Encouraged to limit intake of sugary foods and drinks  Unable to exercise fully currently due to fractured humerus - Ambulatory referral to Ophthalmology - Hemoglobin A1c - CMP14 + Anion Gap - Lipid Profile  2. Closed displaced oblique fracture of shaft of left humerus, initial encounter  Related to a fall, she is awaiting bone density once the COVID-19 Pandemic has settled down. - diclofenac sodium (VOLTAREN) 1 % GEL; Apply 2 g topically 4 (four) times daily.  Dispense: 100 g; Refill: 1  3. Essential hypertension . B/P is well controlled.  . CMP ordered to check renal function.  . The importance of regular exercise as tolerated and dietary modification was stressed to the patient.   4. Encounter for tobacco use cessation counseling  Smoking cessation instruction/counseling given:  counseled patient on the dangers of tobacco use, advised patient to stop smoking, and reviewed strategies to maximize success   Encouraged to set a 12 week quit date - varenicline (CHANTIX STARTING MONTH PAK) 0.5 MG X 11 & 1 MG X 42 tablet; Take one 0.5 mg tablet by mouth once daily for 3 days, then increase to one 0.5 mg tablet twice daily for 4 days, then increase to one 1 mg tablet twice daily.  Dispense: 53 tablet; Refill: 0    Arnette Felts, FNP    THE PATIENT IS ENCOURAGED TO PRACTICE SOCIAL DISTANCING DUE TO THE COVID-19 PANDEMIC.

## 2018-12-17 NOTE — Telephone Encounter (Signed)
Pt called to cancel appt pt was advised to come in due to not having an appt since July 2019 and is a diabetic. Pt says that she is going to run out of medication on Sunday. I explained to her the risk of [place] giving her medication with not knowing her lab results. Pt said that she would call back to let me know if she was coming

## 2018-12-18 ENCOUNTER — Telehealth: Payer: Self-pay | Admitting: Nurse Practitioner

## 2018-12-18 LAB — CMP14 + ANION GAP
ALT: 17 IU/L (ref 0–32)
AST: 14 IU/L (ref 0–40)
Albumin/Globulin Ratio: 1.4 (ref 1.2–2.2)
Albumin: 4 g/dL (ref 3.8–4.8)
Alkaline Phosphatase: 178 IU/L — ABNORMAL HIGH (ref 39–117)
Anion Gap: 17 mmol/L (ref 10.0–18.0)
BUN/Creatinine Ratio: 30 — ABNORMAL HIGH (ref 12–28)
BUN: 32 mg/dL — ABNORMAL HIGH (ref 8–27)
Bilirubin Total: <0.2 mg/dL (ref 0.0–1.2)
CO2: 24 mmol/L (ref 20–29)
Calcium: 9.7 mg/dL (ref 8.7–10.3)
Chloride: 107 mmol/L — ABNORMAL HIGH (ref 96–106)
Creatinine, Ser: 1.08 mg/dL — ABNORMAL HIGH (ref 0.57–1.00)
GFR calc Af Amer: 64 mL/min/{1.73_m2} (ref >59)
GFR calc non Af Amer: 55 mL/min/{1.73_m2} — ABNORMAL LOW (ref >59)
Globulin, Total: 2.8 g/dL (ref 1.5–4.5)
Glucose: 108 mg/dL — ABNORMAL HIGH (ref 65–99)
Potassium: 4.1 mmol/L (ref 3.5–5.2)
Sodium: 148 mmol/L — ABNORMAL HIGH (ref 134–144)
Total Protein: 6.8 g/dL (ref 6.0–8.5)

## 2018-12-18 LAB — LIPID PANEL
Chol/HDL Ratio: 3.6 ratio (ref 0.0–4.4)
Cholesterol, Total: 139 mg/dL (ref 100–199)
HDL: 39 mg/dL — ABNORMAL LOW (ref >39)
LDL Calculated: 73 mg/dL (ref 0–99)
Triglycerides: 133 mg/dL (ref 0–149)
VLDL Cholesterol Cal: 27 mg/dL (ref 5–40)

## 2018-12-18 LAB — HEMOGLOBIN A1C
Est. average glucose Bld gHb Est-mCnc: 134 mg/dL
Hgb A1c MFr Bld: 6.3 % — ABNORMAL HIGH (ref 4.8–5.6)

## 2018-12-18 NOTE — Telephone Encounter (Signed)
PA STARTED KEY  [id number] PA HAS BEEN APPROVED FOR DICLOFENAC 1% GEL

## 2018-12-18 NOTE — Telephone Encounter (Signed)
PA STARTED KEY# [id number] FOR DICLOFENAC GEL 1%

## 2018-12-22 ENCOUNTER — Telehealth (INDEPENDENT_AMBULATORY_CARE_PROVIDER_SITE_OTHER): Payer: Self-pay | Admitting: Radiology

## 2018-12-22 ENCOUNTER — Other Ambulatory Visit (INDEPENDENT_AMBULATORY_CARE_PROVIDER_SITE_OTHER): Payer: BC Managed Care – PPO

## 2018-12-22 DIAGNOSIS — E119 Type 2 diabetes mellitus without complications: Secondary | ICD-10-CM

## 2018-12-22 DIAGNOSIS — I10 Essential (primary) hypertension: Secondary | ICD-10-CM

## 2018-12-22 LAB — POCT URINALYSIS DIPSTICK
Glucose, UA: NEGATIVE
Ketones, UA: NEGATIVE
Nitrite, UA: NEGATIVE
Protein, UA: POSITIVE — AB
Spec Grav, UA: >=1.03 — AB (ref 1.010–1.025)
Urobilinogen, UA: 1 E.U./dL
pH, UA: 6 (ref 5.0–8.0)

## 2018-12-22 LAB — POCT UA - MICROALBUMIN
Creatinine, POC: 300 mg/dL
Microalbumin Ur, POC: 150 mg/L

## 2018-12-22 NOTE — Telephone Encounter (Signed)
Pt and pharmacy notified of approval of diclofenac gel

## 2018-12-22 NOTE — Telephone Encounter (Signed)
Called and spoke with patient, patient answered NO to all pre screening questions.  

## 2018-12-23 ENCOUNTER — Ambulatory Visit (INDEPENDENT_AMBULATORY_CARE_PROVIDER_SITE_OTHER): Payer: BC Managed Care – PPO

## 2018-12-23 ENCOUNTER — Other Ambulatory Visit: Payer: Self-pay

## 2018-12-23 ENCOUNTER — Encounter (INDEPENDENT_AMBULATORY_CARE_PROVIDER_SITE_OTHER): Payer: Self-pay | Admitting: Orthopaedic Surgery

## 2018-12-23 ENCOUNTER — Ambulatory Visit (INDEPENDENT_AMBULATORY_CARE_PROVIDER_SITE_OTHER): Payer: BC Managed Care – PPO | Admitting: Orthopaedic Surgery

## 2018-12-23 DIAGNOSIS — S42332D Displaced oblique fracture of shaft of humerus, left arm, subsequent encounter for fracture with routine healing: Secondary | ICD-10-CM

## 2018-12-23 NOTE — Progress Notes (Signed)
The patient now is about 5 to 6 weeks status post sustaining a left humeral shaft fracture with some comminution and angulation.  Her motor and sensory exam is normal so we elected to treat this in a Sarmiento functional fracture brace.  She is doing well overall.  She denies any numbness tingling in her hand on that left side and she has been very compliant with wearing the brace.  On examination the arm seems to move more as a unit when assessing the left humerus.  The brace is fitting nicely and it is clean.  2 views left humerus in the brace show that the alignment is improved significantly and there is been interval healing.  I will have her continue to wear the brace for another 4 weeks.  I would like to see her back with a repeat 2 views left humerus in 4 weeks for now and that can be done with the brace off.  All question concerns were answered and addressed.

## 2018-12-31 ENCOUNTER — Other Ambulatory Visit: Payer: Self-pay | Admitting: Nurse Practitioner

## 2019-01-04 ENCOUNTER — Encounter: Payer: Self-pay | Admitting: Nurse Practitioner

## 2019-01-08 ENCOUNTER — Encounter: Payer: Self-pay | Admitting: Nurse Practitioner

## 2019-01-13 ENCOUNTER — Other Ambulatory Visit: Payer: Self-pay | Admitting: Nurse Practitioner

## 2019-01-13 DIAGNOSIS — Z716 Tobacco abuse counseling: Secondary | ICD-10-CM

## 2019-01-14 ENCOUNTER — Other Ambulatory Visit: Payer: Self-pay

## 2019-01-14 MED ORDER — PEN NEEDLES 32G X 4 MM MISC: 1.0000 | Freq: Every day | 0 refills | Status: DC

## 2019-01-20 ENCOUNTER — Encounter: Payer: Self-pay | Admitting: Orthopaedic Surgery

## 2019-01-20 ENCOUNTER — Ambulatory Visit (INDEPENDENT_AMBULATORY_CARE_PROVIDER_SITE_OTHER): Payer: BC Managed Care – PPO | Admitting: Orthopaedic Surgery

## 2019-01-20 ENCOUNTER — Other Ambulatory Visit: Payer: Self-pay

## 2019-01-20 ENCOUNTER — Ambulatory Visit (INDEPENDENT_AMBULATORY_CARE_PROVIDER_SITE_OTHER): Payer: BC Managed Care – PPO

## 2019-01-20 DIAGNOSIS — S42332D Displaced oblique fracture of shaft of humerus, left arm, subsequent encounter for fracture with routine healing: Secondary | ICD-10-CM

## 2019-01-20 NOTE — Progress Notes (Signed)
The patient is now 9 weeks into a left comminuted humeral shaft fracture.  We have been treating this with a Sarmiento functional fracture brace.  She has been compliant with wearing the brace.  She has some pain but overall reports that she is doing well.  She does ambulate with a rolling walker.  She is a chronic smoker as well and does smoke significantly.  On exam the humerus moves as a unit to the left side.  I stressed the fracture site and this causes minimal discomfort.  Distally her motor and sensory exam in her hand is normal.  2 views left humerus are obtained out of the Sarmiento splint and there is significant callus formation is seen compared to last films.  Fracture lines are still visible but overall alignment is well-maintained.  This point she is 9 weeks out from his injury can be out of the fracture brace at this point.  She will still avoid heavy lifting and be careful with her left arm.  We will see her back for final visit in 4 weeks from now with a repeat 2 views of the left humerus shaft.

## 2019-02-03 ENCOUNTER — Other Ambulatory Visit: Payer: Self-pay | Admitting: Nurse Practitioner

## 2019-02-17 ENCOUNTER — Ambulatory Visit: Payer: BC Managed Care – PPO | Admitting: Orthopaedic Surgery

## 2019-02-18 ENCOUNTER — Encounter: Payer: Self-pay | Admitting: Nurse Practitioner

## 2019-02-18 DIAGNOSIS — H25813 Combined forms of age-related cataract, bilateral: Secondary | ICD-10-CM | POA: Insufficient documentation

## 2019-02-18 DIAGNOSIS — H35033 Hypertensive retinopathy, bilateral: Secondary | ICD-10-CM | POA: Insufficient documentation

## 2019-02-18 LAB — HM DIABETES EYE EXAM

## 2019-02-19 ENCOUNTER — Other Ambulatory Visit: Payer: Self-pay

## 2019-02-19 ENCOUNTER — Encounter: Payer: Self-pay | Admitting: Nurse Practitioner

## 2019-02-19 ENCOUNTER — Encounter (INDEPENDENT_AMBULATORY_CARE_PROVIDER_SITE_OTHER): Payer: BC Managed Care – PPO | Admitting: Ophthalmology

## 2019-02-19 DIAGNOSIS — H43813 Vitreous degeneration, bilateral: Secondary | ICD-10-CM

## 2019-02-19 DIAGNOSIS — E11311 Type 2 diabetes mellitus with unspecified diabetic retinopathy with macular edema: Secondary | ICD-10-CM

## 2019-02-19 DIAGNOSIS — I10 Essential (primary) hypertension: Secondary | ICD-10-CM

## 2019-02-19 DIAGNOSIS — H34812 Central retinal vein occlusion, left eye, with macular edema: Secondary | ICD-10-CM

## 2019-02-19 DIAGNOSIS — H35033 Hypertensive retinopathy, bilateral: Secondary | ICD-10-CM

## 2019-02-19 DIAGNOSIS — E113311 Type 2 diabetes mellitus with moderate nonproliferative diabetic retinopathy with macular edema, right eye: Secondary | ICD-10-CM

## 2019-02-19 DIAGNOSIS — H2513 Age-related nuclear cataract, bilateral: Secondary | ICD-10-CM

## 2019-03-07 ENCOUNTER — Other Ambulatory Visit: Payer: Self-pay | Admitting: Nurse Practitioner

## 2019-03-11 ENCOUNTER — Other Ambulatory Visit: Payer: Self-pay | Admitting: Nurse Practitioner

## 2019-03-19 ENCOUNTER — Other Ambulatory Visit: Payer: Self-pay

## 2019-03-19 ENCOUNTER — Encounter (INDEPENDENT_AMBULATORY_CARE_PROVIDER_SITE_OTHER): Payer: BC Managed Care – PPO | Admitting: Ophthalmology

## 2019-03-19 DIAGNOSIS — H35033 Hypertensive retinopathy, bilateral: Secondary | ICD-10-CM | POA: Diagnosis not present

## 2019-03-19 DIAGNOSIS — E113313 Type 2 diabetes mellitus with moderate nonproliferative diabetic retinopathy with macular edema, bilateral: Secondary | ICD-10-CM

## 2019-03-19 DIAGNOSIS — E11311 Type 2 diabetes mellitus with unspecified diabetic retinopathy with macular edema: Secondary | ICD-10-CM | POA: Diagnosis not present

## 2019-03-19 DIAGNOSIS — I10 Essential (primary) hypertension: Secondary | ICD-10-CM

## 2019-03-19 DIAGNOSIS — H43813 Vitreous degeneration, bilateral: Secondary | ICD-10-CM

## 2019-04-07 ENCOUNTER — Other Ambulatory Visit: Payer: Self-pay | Admitting: Nurse Practitioner

## 2019-04-22 ENCOUNTER — Encounter (INDEPENDENT_AMBULATORY_CARE_PROVIDER_SITE_OTHER): Payer: BC Managed Care – PPO | Admitting: Ophthalmology

## 2019-04-22 ENCOUNTER — Other Ambulatory Visit: Payer: Self-pay | Admitting: Nurse Practitioner

## 2019-04-22 ENCOUNTER — Other Ambulatory Visit: Payer: Self-pay

## 2019-04-22 DIAGNOSIS — H43813 Vitreous degeneration, bilateral: Secondary | ICD-10-CM

## 2019-04-22 DIAGNOSIS — I10 Essential (primary) hypertension: Secondary | ICD-10-CM

## 2019-04-22 DIAGNOSIS — H35033 Hypertensive retinopathy, bilateral: Secondary | ICD-10-CM

## 2019-04-22 DIAGNOSIS — E113313 Type 2 diabetes mellitus with moderate nonproliferative diabetic retinopathy with macular edema, bilateral: Secondary | ICD-10-CM | POA: Diagnosis not present

## 2019-04-22 DIAGNOSIS — E11311 Type 2 diabetes mellitus with unspecified diabetic retinopathy with macular edema: Secondary | ICD-10-CM | POA: Diagnosis not present

## 2019-04-22 DIAGNOSIS — H34812 Central retinal vein occlusion, left eye, with macular edema: Secondary | ICD-10-CM | POA: Diagnosis not present

## 2019-05-01 ENCOUNTER — Other Ambulatory Visit: Payer: Self-pay

## 2019-05-01 DIAGNOSIS — Z20822 Contact with and (suspected) exposure to covid-19: Secondary | ICD-10-CM

## 2019-05-02 LAB — NOVEL CORONAVIRUS, NAA: SARS-CoV-2, NAA: NOT DETECTED

## 2019-05-06 ENCOUNTER — Ambulatory Visit: Payer: BC Managed Care – PPO | Admitting: Nurse Practitioner

## 2019-05-06 ENCOUNTER — Other Ambulatory Visit: Payer: Self-pay | Admitting: Nurse Practitioner

## 2019-05-06 ENCOUNTER — Encounter: Payer: Self-pay | Admitting: Nurse Practitioner

## 2019-05-06 ENCOUNTER — Other Ambulatory Visit: Payer: Self-pay

## 2019-05-06 VITALS — BP 144/76 | HR 70 | Temp 98.7°F | Ht 63.2 in | Wt 200.4 lb

## 2019-05-06 DIAGNOSIS — H35033 Hypertensive retinopathy, bilateral: Secondary | ICD-10-CM

## 2019-05-06 DIAGNOSIS — I10 Essential (primary) hypertension: Secondary | ICD-10-CM | POA: Diagnosis not present

## 2019-05-06 DIAGNOSIS — E119 Type 2 diabetes mellitus without complications: Secondary | ICD-10-CM | POA: Diagnosis not present

## 2019-05-06 DIAGNOSIS — Z716 Tobacco abuse counseling: Secondary | ICD-10-CM | POA: Diagnosis not present

## 2019-05-06 DIAGNOSIS — Z1159 Encounter for screening for other viral diseases: Secondary | ICD-10-CM

## 2019-05-06 MED ORDER — FREESTYLE LIBRE SENSOR SYSTEM MISC: 1.0000 | Freq: Four times a day (QID) | 3 refills | Status: DC

## 2019-05-06 MED ORDER — FREESTYLE LIBRE READER DEVI: 1.0000 | Freq: Four times a day (QID) | 0 refills | Status: DC

## 2019-05-06 NOTE — Progress Notes (Signed)
Subjective:     Patient ID: [name] , female    DOB: 08/12/1956 , 63 y.o.   MRN: [id number]   Chief Complaint  Patient presents with  . Diabetes    HPI  She is taking 16 units per day of Xultophy. She has not been checking her blood sugar due to the glucometer is broken.    Diabetes She presents for her follow-up diabetic visit. She has type 2 diabetes mellitus. Her disease course has been worsening. There are no hypoglycemic associated symptoms. Pertinent negatives for diabetes include no blurred vision, no fatigue, no polydipsia, no polyphagia and no polyuria. There are no hypoglycemic complications. Symptoms are worsening. There are no diabetic complications. Risk factors for coronary artery disease include sedentary lifestyle and obesity. She is following a generally healthy diet. When asked about meal planning, she reported none. She has not had a previous visit with a dietitian. She rarely participates in exercise. An ACE inhibitor/angiotensin II receptor blocker is not being taken. Eye exam is not current.     Past Medical History:  Diagnosis Date  . Back pain   . Diabetes mellitus without complication (HCC)   . Hypertension   . Olecranon bursitis of left elbow      Family History  Problem Relation Age of Onset  . Diabetes Mother   . Hypertension Mother   . Diabetes Father   . Hypertension Father      Current Outpatient Medications:  .  amLODipine (NORVASC) 5 MG tablet, TAKE 1 TABLET EVERY DAY, Disp: 90 tablet, Rfl: 1 .  aspirin EC 325 MG EC tablet, Take 1 tablet (325 mg total) by mouth daily. Take x 1 month post op to decrease risk of blood clots., Disp: 30 tablet, Rfl: 0 .  diclofenac sodium (VOLTAREN) 1 % GEL, Apply 2 g topically 4 (four) times daily., Disp: 100 g, Rfl: 1 .  Insulin Degludec-Liraglutide (XULTOPHY) 100-3.6 UNIT-MG/ML SOPN, Inject 24 Units into the skin at bedtime., Disp: 5 pen, Rfl: 3 .  Insulin Pen Needle (PEN NEEDLES) 32G X 4 MM MISC, 1  each by Does not apply route daily at 12 noon. One injection daily SQ, Disp: 100 each, Rfl: 0 .  valsartan (DIOVAN) 160 MG tablet, TAKE 1 TABLET BY MOUTH EVERY DAY, Disp: 30 tablet, Rfl: 0 .  Continuous Blood Gluc Receiver (FREESTYLE LIBRE READER) DEVI, 1 each by Does not apply route 4 (four) times daily., Disp: 1 Device, Rfl: 0 .  Continuous Blood Gluc Sensor (FREESTYLE LIBRE SENSOR SYSTEM) MISC, 1 each by Does not apply route 4 (four) times daily., Disp: 6 each, Rfl: 3   No Known Allergies   Review of Systems  Constitutional: Negative for fatigue and fever.  Eyes: Negative for blurred vision.  Cardiovascular: Negative.  Negative for palpitations and leg swelling.  Endocrine: Negative for polydipsia, polyphagia and polyuria.     Today's Vitals   05/06/19 1136  BP: (!) 144/76  Pulse: 70  Temp: 98.7 F (37.1 C)  TempSrc: Oral  Weight: 200 lb 6.4 oz (90.9 kg)  Height: 5' 3.2" (1.605 m)  PainSc: 8   PainLoc: Back   Body mass index is 35.27 kg/m.   Objective:  Physical Exam Vitals signs reviewed.  Constitutional:      Appearance: Normal appearance. She is well-developed.  Neck:     Musculoskeletal: Normal range of motion and neck supple.  Cardiovascular:     Rate and Rhythm: Normal rate and regular rhythm.  Pulses: Normal pulses.     Heart sounds: Normal heart sounds. No murmur.  Pulmonary:     Effort: Pulmonary effort is normal. No respiratory distress.     Breath sounds: Normal breath sounds.  Chest:     Chest wall: No tenderness.  Musculoskeletal: Normal range of motion.        General: Tenderness (left shoulder) present.     Comments: Fractured humerus to left arm  Skin:    General: Skin is warm and dry.     Capillary Refill: Capillary refill takes less than 2 seconds.  Neurological:     General: No focal deficit present.     Mental Status: She is alert and oriented to person, place, and time.  Psychiatric:        Mood and Affect: Mood normal.         Behavior: Behavior normal.        Thought Content: Thought content normal.        Judgment: Judgment normal.         Assessment And Plan:     1. Type 2 diabetes mellitus without complication, without long-term current use of insulin (HCC)  Chronic, poorly controlled  She has not been tot he office in more than 8 months  Continue with current medications  Encouraged to limit intake of sugary foods and drinks  Unable to exercise fully currently due to fractured humerus - Ambulatory referral to Ophthalmology - Hemoglobin A1c - CMP14 + Anion Gap - Lipid Profile  2. Closed displaced oblique fracture of shaft of left humerus, initial encounter  Related to a fall, she is awaiting bone density once the COVID-19 Pandemic has settled down. - diclofenac sodium (VOLTAREN) 1 % GEL; Apply 2 g topically 4 (four) times daily.  Dispense: 100 g; Refill: 1  3. Essential hypertension . B/P is well controlled.  . CMP ordered to check renal function.  . The importance of regular exercise as tolerated and dietary modification was stressed to the patient.   4. Encounter for tobacco use cessation counseling  She has not been consistent with the chantix due to concern about hallucinations.     [name], FNP    THE PATIENT IS ENCOURAGED TO PRACTICE SOCIAL DISTANCING DUE TO THE COVID-19 PANDEMIC.

## 2019-05-06 NOTE — Progress Notes (Signed)
Subjective:     Patient ID: Kristin LeiSheila M [name] , female    DOB: 01/23/1956 , 63 y.o.   MRN: [id number]   Chief Complaint  Patient presents with  . Diabetes    HPI  Diabetes She presents for her follow-up diabetic visit. She has type 2 diabetes mellitus. Her disease course has been worsening. There are no hypoglycemic associated symptoms. Pertinent negatives for diabetes include no blurred vision, no fatigue, no polydipsia, no polyphagia and no polyuria. There are no hypoglycemic complications. Symptoms are worsening. There are no diabetic complications. Risk factors for coronary artery disease include sedentary lifestyle and obesity. She is following a generally healthy diet. When asked about meal planning, she reported none. She has not had a previous visit with a dietitian. She rarely participates in exercise. An ACE inhibitor/angiotensin II receptor blocker is not being taken. Eye exam is not current.     Past Medical History:  Diagnosis Date  . Back pain   . Diabetes mellitus without complication (HCC)   . Hypertension   . Olecranon bursitis of left elbow      Family History  Problem Relation Age of Onset  . Diabetes Mother   . Hypertension Mother   . Diabetes Father   . Hypertension Father      Current Outpatient Medications:  .  amLODipine (NORVASC) 5 MG tablet, TAKE 1 TABLET EVERY DAY, Disp: 90 tablet, Rfl: 1 .  aspirin EC 325 MG EC tablet, Take 1 tablet (325 mg total) by mouth daily. Take x 1 month post op to decrease risk of blood clots., Disp: 30 tablet, Rfl: 0 .  diclofenac sodium (VOLTAREN) 1 % GEL, Apply 2 g topically 4 (four) times daily., Disp: 100 g, Rfl: 1 .  Insulin Degludec-Liraglutide (XULTOPHY) 100-3.6 UNIT-MG/ML SOPN, Inject 24 Units into the skin at bedtime., Disp: 5 pen, Rfl: 3 .  Insulin Pen Needle (PEN NEEDLES) 32G X 4 MM MISC, 1 each by Does not apply route daily at 12 noon. One injection daily SQ, Disp: 100 each, Rfl: 0 .  valsartan (DIOVAN) 160 MG  tablet, TAKE 1 TABLET BY MOUTH EVERY DAY, Disp: 30 tablet, Rfl: 0   No Known Allergies   Review of Systems  Constitutional: Negative for fatigue and fever.  Eyes: Negative for blurred vision.  Respiratory: Negative.   Cardiovascular: Negative.  Negative for palpitations and leg swelling.  Endocrine: Negative for polydipsia, polyphagia and polyuria.  Neurological: Negative.   Hematological: Negative.   Psychiatric/Behavioral: Negative.      Today's Vitals   05/06/19 1136  BP: (!) 144/76  Pulse: 70  Temp: 98.7 F (37.1 C)  TempSrc: Oral  Weight: 200 lb 6.4 oz (90.9 kg)  Height: 5' 3.2" (1.605 m)  PainSc: 8   PainLoc: Back   Body mass index is 35.27 kg/m.   Objective:  Physical Exam Vitals signs reviewed.  Constitutional:      Appearance: Normal appearance. She is well-developed.  Neck:     Musculoskeletal: Normal range of motion and neck supple.  Cardiovascular:     Rate and Rhythm: Normal rate and regular rhythm.     Pulses: Normal pulses.     Heart sounds: Normal heart sounds. No murmur.  Pulmonary:     Effort: Pulmonary effort is normal. No respiratory distress.     Breath sounds: Normal breath sounds.  Chest:     Chest wall: No tenderness.  Musculoskeletal: Normal range of motion.     Comments: Using a rollator walker  Skin:    General: Skin is warm and dry.     Capillary Refill: Capillary refill takes less than 2 seconds.  Neurological:     General: No focal deficit present.     Mental Status: She is alert and oriented to person, place, and time.  Psychiatric:        Mood and Affect: Mood normal.        Behavior: Behavior normal.        Thought Content: Thought content normal.        Judgment: Judgment normal.         Assessment And Plan:     1. Type 2 diabetes mellitus without complication, without long-term current use of insulin (HCC)  Chronic, poorly controlled  She has not been checking her blood sugar regularly due to her machine is  broke.  I have placed an order for a continuous blood glucose device  Referral sent to [facility] for assistance with medication cost  Continue with current medications, Xultophy 16 units daily.  Encouraged to limit intake of sugary foods and drinks and to walk regularly with a goal of 150 minutes weekly as tolerated - Hemoglobin A1c - CMP14 + Anion Gap - Lipid Profile  2. Essential hypertension . B/P is fairly controlled.  . CMP ordered to check renal function.  . The importance of regular exercise as tolerated and dietary modification was stressed to the patient.   4. Encounter for tobacco use cessation counseling  Smoking cessation instruction/counseling given:  counseled patient on the dangers of tobacco use, advised patient to stop smoking, and reviewed strategies to maximize success   She had stopped taking Chantix for a brief time due to increase in stress at home. She is planning to restart - varenicline (CHANTIX STARTING MONTH PAK) 0.5 MG X 11 & 1 MG X 42 tablet; Take one 0.5 mg tablet by mouth once daily for 3 days, then increase to one 0.5 mg tablet twice daily for 4 days, then increase to one 1 mg tablet twice daily.  Dispense: 53 tablet; Refill: 0  4. Hypertensive retinopathy of both eyes  Chronic,  Continue follow up with Dr. Katy Fitch and the retina specialist  5. Encounter for hepatitis C screening test for low risk patient  Will check for Hepatitis C screening due to being born between the years [birth year] - Hepatitis C antibody   [name], FNP    THE PATIENT IS ENCOURAGED TO PRACTICE SOCIAL DISTANCING DUE TO THE COVID-19 PANDEMIC.

## 2019-05-07 ENCOUNTER — Ambulatory Visit: Payer: Self-pay

## 2019-05-07 DIAGNOSIS — I10 Essential (primary) hypertension: Secondary | ICD-10-CM

## 2019-05-07 DIAGNOSIS — E119 Type 2 diabetes mellitus without complications: Secondary | ICD-10-CM

## 2019-05-07 LAB — HEMOGLOBIN A1C
Est. average glucose Bld gHb Est-mCnc: 134 mg/dL
Hgb A1c MFr Bld: 6.3 % — ABNORMAL HIGH (ref 4.8–5.6)

## 2019-05-07 LAB — CMP14 + ANION GAP
ALT: 12 IU/L (ref 0–32)
AST: 13 IU/L (ref 0–40)
Albumin/Globulin Ratio: 1.5 (ref 1.2–2.2)
Albumin: 4.1 g/dL (ref 3.8–4.8)
Alkaline Phosphatase: 109 IU/L (ref 39–117)
Anion Gap: 15 mmol/L (ref 10.0–18.0)
BUN/Creatinine Ratio: 23 (ref 12–28)
BUN: 22 mg/dL (ref 8–27)
Bilirubin Total: <0.2 mg/dL (ref 0.0–1.2)
CO2: 23 mmol/L (ref 20–29)
Calcium: 9.3 mg/dL (ref 8.7–10.3)
Chloride: 108 mmol/L — ABNORMAL HIGH (ref 96–106)
Creatinine, Ser: 0.95 mg/dL (ref 0.57–1.00)
GFR calc Af Amer: 74 mL/min/{1.73_m2} (ref >59)
GFR calc non Af Amer: 64 mL/min/{1.73_m2} (ref >59)
Globulin, Total: 2.7 g/dL (ref 1.5–4.5)
Glucose: 78 mg/dL (ref 65–99)
Potassium: 4.4 mmol/L (ref 3.5–5.2)
Sodium: 146 mmol/L — ABNORMAL HIGH (ref 134–144)
Total Protein: 6.8 g/dL (ref 6.0–8.5)

## 2019-05-07 NOTE — Chronic Care Management (AMB) (Signed)
  Chronic Care Management   Outreach Note  05/07/2019 Name: [name] MRN: [id number] DOB: Nov 17, 1955  Referred by: [name], FNP Reason for referral : Care Coordination   An unsuccessful telephone outreach was attempted today. The patient was referred to the case management team by for assistance with chronic care management and care coordination.   Follow Up Plan: A HIPPA compliant phone message was left for the patient providing contact information and requesting a return call.  The care management team will reach out to the patient again over the next 10 days.   Daneen Schick, BSW, CDP Social Worker, Certified Dementia Practitioner [facility] [phone]

## 2019-05-12 ENCOUNTER — Ambulatory Visit: Payer: Self-pay

## 2019-05-12 DIAGNOSIS — E119 Type 2 diabetes mellitus without complications: Secondary | ICD-10-CM

## 2019-05-12 DIAGNOSIS — I10 Essential (primary) hypertension: Secondary | ICD-10-CM

## 2019-05-12 NOTE — Chronic Care Management (AMB) (Signed)
  Care Management   Outreach Note  05/12/2019 Name: [name] MRN: [id number] DOB: 04/22/1956  Referred by: [name], FNP Reason for referral : Care Coordination   A second unsuccessful telephone outreach was attempted today. The patient was referred to the case management team for assistance with chronic care management and care coordination.   Follow Up Plan: A HIPPA compliant phone message was left for the patient providing contact information and requesting a return call.  The care management team will reach out to the patient again over the next 10 days.   Daneen Schick, BSW, CDP Social Worker, Certified Dementia Practitioner [facility] [phone]

## 2019-05-13 ENCOUNTER — Telehealth: Payer: Self-pay

## 2019-05-13 ENCOUNTER — Other Ambulatory Visit: Payer: Self-pay

## 2019-05-13 MED ORDER — ATORVASTATIN CALCIUM 10 MG PO TABS: 10.0000 mg | ORAL_TABLET | Freq: Every day | ORAL | 2 refills | Status: DC

## 2019-05-13 NOTE — Telephone Encounter (Signed)
Send atorvastatin 10mg  once a day 30 tabs with 2 refills

## 2019-05-13 NOTE — Telephone Encounter (Signed)
I called pt to see if she has ever taken a statin medication before and she stated she has not I advised her that it is recommended that she take one due to her having HTN and Diabetes to decrease her risk for cardia disease. Pt stated she is ok with it. YRL,RMA

## 2019-05-13 NOTE — Telephone Encounter (Signed)
Rx sent. YRL,RMA

## 2019-05-13 NOTE — Progress Notes (Signed)
ator

## 2019-05-20 ENCOUNTER — Ambulatory Visit: Payer: Self-pay

## 2019-05-20 ENCOUNTER — Encounter (INDEPENDENT_AMBULATORY_CARE_PROVIDER_SITE_OTHER): Payer: BC Managed Care – PPO | Admitting: Ophthalmology

## 2019-05-20 ENCOUNTER — Other Ambulatory Visit: Payer: Self-pay

## 2019-05-20 DIAGNOSIS — I10 Essential (primary) hypertension: Secondary | ICD-10-CM

## 2019-05-20 DIAGNOSIS — E11311 Type 2 diabetes mellitus with unspecified diabetic retinopathy with macular edema: Secondary | ICD-10-CM

## 2019-05-20 DIAGNOSIS — H34812 Central retinal vein occlusion, left eye, with macular edema: Secondary | ICD-10-CM | POA: Diagnosis not present

## 2019-05-20 DIAGNOSIS — E113313 Type 2 diabetes mellitus with moderate nonproliferative diabetic retinopathy with macular edema, bilateral: Secondary | ICD-10-CM

## 2019-05-20 DIAGNOSIS — H35033 Hypertensive retinopathy, bilateral: Secondary | ICD-10-CM

## 2019-05-20 DIAGNOSIS — H43813 Vitreous degeneration, bilateral: Secondary | ICD-10-CM

## 2019-05-20 DIAGNOSIS — E119 Type 2 diabetes mellitus without complications: Secondary | ICD-10-CM

## 2019-05-20 NOTE — Chronic Care Management (AMB) (Signed)
   Care Management   Initial Visit Note  05/20/2019 Name: [name] MRN: [id number] DOB: Nov 21, 1955  Referred by: [name], FNP Reason for referral : Care Coordination (CC RNCM Case Collaboration )   [name] is a 63 y.o. year old female who is a primary care patient of [name], [place]. The care management team was consulted for assistance with chronic disease management and care coordination needs.   Review of patient status, including review of consultants reports, relevant laboratory and other test results, and collaboration with appropriate care team members and the patient's provider was performed as part of comprehensive patient evaluation and provision of chronic care management services.    I initiated and established the plan of care for [name] during one on one collaboration with my clinical care management colleague Daneen Schick BSW who is also engaged with this patient to address social work needs.   Outpatient Encounter Medications as of 05/20/2019  Medication Sig  . amLODipine (NORVASC) 5 MG tablet TAKE 1 TABLET EVERY DAY  . aspirin EC 325 MG EC tablet Take 1 tablet (325 mg total) by mouth daily. Take x 1 month post op to decrease risk of blood clots.  [name] atorvastatin (LIPITOR) 10 MG tablet Take 1 tablet (10 mg total) by mouth daily.  . BD PEN NEEDLE NANO U/F 32G X 4 MM MISC USE FOR 1 INJECTION DAILY  . Continuous Blood Gluc Receiver (FREESTYLE LIBRE READER) DEVI 1 each by Does not apply route 4 (four) times daily.  . Continuous Blood Gluc Sensor (FREESTYLE LIBRE SENSOR SYSTEM) MISC 1 each by Does not apply route 4 (four) times daily.  . diclofenac sodium (VOLTAREN) 1 % GEL Apply 2 g topically 4 (four) times daily.  . Insulin Degludec-Liraglutide (XULTOPHY) 100-3.6 UNIT-MG/ML SOPN Inject 24 Units into the skin at bedtime.  . valsartan (DIOVAN) 160 MG tablet TAKE 1 TABLET BY MOUTH EVERY DAY   No facility-administered encounter medications on  file as of 05/20/2019.      Goals Addressed    . Assist with Chronic Care Management and Care Coordination needs       Current Barriers:  [name] Knowledge Barriers related to resources and support available to address needs related to Chronic disease management and Care Coordination   Case Manager Clinical Goal(s):  [name] Over the next 30 days, patient will work with the CCM team to address needs related to Chronic Care Management and Care Coordination needs.   Interventions:  . Collaborated with BSW and initiated plan of care to address needs related to Chronic Care Management and [employer].   Patient Self Care Activities:  . Attends all scheduled provider appointments . Calls pharmacy for medication refills . Performs ADL's independently . Calls provider office for new concerns or questions  Initial goal documentation         Telephone follow up appointment with care management team member scheduled for: 06/03/19  Barb Merino, RN, BSN, CCM Care Management Coordinator [place] [facility]  Direct Phone: [phone]

## 2019-05-20 NOTE — Chronic Care Management (AMB) (Signed)
Care Management     05/20/2019 Name: [name] MRN: [id number] DOB: 04/15/1956  Referred by: Arnette FeltsMoore, Janece, FNP Reason for referral : Care Coordination   [name] is a 63 y.o. year old female who is a primary care patient of Arnette FeltsMoore, Janece, FNP. The care management team was consulted for assistance with care management and care coordination needs.    Review of patient status, including review of consultants reports, relevant laboratory and other test results, and collaboration with appropriate care team members and the patient's provider was performed as part of comprehensive patient evaluation and provision of chronic care management services.    [name] was given information about Care Management services today including:  1. Care Management services includes personalized support from designated clinical staff supervised by her physician, including individualized plan of care and coordination with other care providers 2. 24/7 contact phone numbers for assistance for urgent and routine care needs. 3. The patient may stop case management services at any time by phone call to the office staff.  Patient agreed to services and verbal consent obtained.    SDOH (Social Determinants of Health) screening performed today: Transportation. See Care Plan for related entries.   Goals Addressed            This Visit's Progress     Patient Stated   . "I want more transportation options" (pt-stated)       Current Barriers:  . Limited ability to drive self due to past incident of becoming lost in the rain . Limited knowledge of available community resources to assist with transportation  Clinical Social Work Clinical Goal(s):  [name]. Over the next 45 days, client will follow up with [employer] as directed by SW  Interventions: . Patient interviewed and appropriate assessments performed . Determined that although the patient her her drivers license she no longer drives  due to a past incident [place] [address] . Assessed for patient current mode of transportation . Determined the patient relies on her niece's to assist with transportation but would be open to alternate resources as well . Educated the patient on SCAT services as well as [employer] . Informed by the patient she is not interested in accessing SCAT as she has never used a bus system and does not feel comfortable with the resource . Advised the patient SW would refer her to [employer] [facility] [place] [facility] [employer] program who would mail her an information packet in the mail . Placed referral to [employer] via [employer] platform . Scheduled follow up call to the patient to assist with patient stated goal  Patient Self Care Activities:  . Currently UNABLE TO independently drive self to physician appointments  Initial goal documentation       Other   . COMPLETED: Assist with enrollment into Chronic Care Management program and conduct SDOH screen       Current Barriers:  [name]. Knowledge Barriers related to resources and support available to address needs related to Chronic Care Management and challenges surrounding Social Determinants of Health  Clinical Social Work Clinical Goal(s):   Over the next 20 days, the patient will understand the role of the CCM team and work with SW to complete SDOH (Social Determinants of Health) screen.  Interventions:  Outbound call placed to the patient to introduce the Chronic Care Management program  Patient Education provided regarding referral placed by Arnette FeltsJanece Moore, FNP and obtained verbal consent for  enrollment  Patient screened for SDOH (Social Determinants of Health)  Identified challenges with: transportation  Provided education about SW role and plan to address identified SDOH challenge(s)  Collaboration with CCM team to communicate patient's enrollment into the program  Patient Self Care Activities:    Patient currently unable to independently manage chronic conditions   Patient currently unable to identify community resources to assist with transportation challenges  Initial goal documentation: See individual goal documentation for details related to specific interventions         The care management team will reach out to the patient again over the next 21 days.   Daneen Schick, BSW, CDP Social Worker, Certified Dementia Practitioner [place] [facility] [place] [facility] [phone]

## 2019-05-20 NOTE — Patient Instructions (Signed)
Visit Information  Goals Addressed            This Visit's Progress     Patient Stated   . "I want more transportation options" (pt-stated)       Current Barriers:  . Limited ability to drive self due to past incident of becoming lost in the rain . Limited knowledge of available community resources to assist with transportation  Clinical Social Work Clinical Goal(s):  [name]. Over the next 45 days, client will follow up with [facility] [employer] as directed by SW  Interventions: . Patient interviewed and appropriate assessments performed . Determined that although the patient her her drivers license she no longer drives due to a past incident when [place] [address] . Assessed for patient current mode of transportation . Determined the patient relies on her niece's to assist with transportation but would be open to alternate resources as well . Educated the patient on SCAT services as well as [employer] . Informed by the patient she is not interested in accessing SCAT as she has never used a bus system and does not feel comfortable with the resource . Advised the patient SW would refer her to [employer] [facility] [place] [facility] [employer] program who would mail her an information packet in the mail . Placed referral to [employer] via [employer] platform . Scheduled follow up call to the patient to assist with patient stated goal  Patient Self Care Activities:  . Currently UNABLE TO independently drive self to physician appointments  Initial goal documentation       Other   . COMPLETED: Assist with enrollment into Chronic Care Management program and conduct SDOH screen       Current Barriers:  [name]. Knowledge Barriers related to resources and support available to address needs related to Chronic Care Management and challenges surrounding Social Determinants of Health  Clinical Social Work Clinical Goal(s):   Over the next 20 days, the patient will understand the role  of the CCM team and work with SW to complete SDOH (Social Determinants of Health) screen.  Interventions:  Outbound call placed to the patient to introduce the Chronic Care Management program  Patient Education provided regarding referral placed by Arnette FeltsJanece Moore, FNP and obtained verbal consent for enrollment  Patient screened for SDOH (Social Determinants of Health)  Identified challenges with: transportation  Provided education about SW role and plan to address identified SDOH challenge(s)  Collaboration with CCM team to communicate patient's enrollment into the program  Patient Self Care Activities:   Patient currently unable to independently manage chronic conditions   Patient currently unable to identify community resources to assist with transportation challenges  Initial goal documentation: See individual goal documentation for details related to specific interventions        Ms. [name] was given information about Care Management services today including:  1. Care Management services include personalized support from designated clinical staff supervised by her physician, including individualized plan of care and coordination with other care providers 2. 24/7 contact phone numbers for assistance for urgent and routine care needs. 3. The patient may stop CCM services at any time (effective at the end of the month) by phone call to the office staff.  Patient agreed to services and verbal consent obtained.   The patient verbalized understanding of instructions provided today and declined a print copy of patient instruction materials.   The care management team will reach out to the patient again  over the next 21 days.   Daneen Schick, BSW, CDP Social Worker, Certified Dementia Practitioner [place] [facility] [place] [facility] [phone]

## 2019-06-01 ENCOUNTER — Telehealth: Payer: Self-pay

## 2019-06-01 NOTE — Telephone Encounter (Signed)
Patient called stating she is taking atorvastatin 10mg  daily and it is not agreeing with her she feels weak and tired all the time and she does not have high cholesterol she wanted to know what should she do.  I RETURNED PT CALL AND ADVISED HER TO TAKE THE MED ONCE WEEKLY AND SEE HOW THAT WORKS FOR HER. WE DONT WANT HER RISK FOR CARDIAC DISEASE TO INCREASE. Lonia Mad

## 2019-06-03 ENCOUNTER — Telehealth: Payer: Self-pay

## 2019-06-08 ENCOUNTER — Ambulatory Visit: Payer: Self-pay

## 2019-06-08 ENCOUNTER — Telehealth: Payer: Self-pay

## 2019-06-08 DIAGNOSIS — E119 Type 2 diabetes mellitus without complications: Secondary | ICD-10-CM

## 2019-06-08 DIAGNOSIS — I10 Essential (primary) hypertension: Secondary | ICD-10-CM

## 2019-06-08 NOTE — Chronic Care Management (AMB) (Signed)
Care Management   Social Work Follow Up Note  06/08/2019 Name: [name] MRN: [id number] DOB: October 09, 1955  [name] is a 63 y.o. year old female who is a primary care patient of [name], [place]. The CCM team was consulted for assistance with Transportation Needs .   Review of patient status, including review of consultants reports, other relevant assessments, and collaboration with appropriate care team members and the patient's provider was performed as part of comprehensive patient evaluation and provision of chronic care management services.    I placed a follow up call to the patient on today's date to assess progression of patient goal to obtain alternative transportation resources. During today's call the patient expressed concern surrounding medication costs and ability to obtain prescriptions. See care plan below:  Outpatient Encounter Medications as of 06/08/2019  Medication Sig  . amLODipine (NORVASC) 5 MG tablet TAKE 1 TABLET EVERY DAY  . aspirin EC 325 MG EC tablet Take 1 tablet (325 mg total) by mouth daily. Take x 1 month post op to decrease risk of blood clots.  [name] atorvastatin (LIPITOR) 10 MG tablet Take 1 tablet (10 mg total) by mouth daily.  . BD PEN NEEDLE NANO U/F 32G X 4 MM MISC USE FOR 1 INJECTION DAILY  . Continuous Blood Gluc Receiver (FREESTYLE LIBRE READER) DEVI 1 each by Does not apply route 4 (four) times daily.  . Continuous Blood Gluc Sensor (FREESTYLE LIBRE SENSOR SYSTEM) MISC 1 each by Does not apply route 4 (four) times daily.  . diclofenac sodium (VOLTAREN) 1 % GEL Apply 2 g topically 4 (four) times daily.  . Insulin Degludec-Liraglutide (XULTOPHY) 100-3.6 UNIT-MG/ML SOPN Inject 24 Units into the skin at bedtime.  . valsartan (DIOVAN) 160 MG tablet TAKE 1 TABLET BY MOUTH EVERY DAY   No facility-administered encounter medications on file as of 06/08/2019.      Goals Addressed            This Visit's Progress     Patient Stated   .  "I need help with my medicine" (pt-stated)       Current Barriers:  . Financial constraints related to medication costs . Transportation barriers  Clinical Social Work Clinical Goal(s):  [name] Over the next 5 days, patient  will follow up with [facility] as directed by SW . Over the next 45 days the patient will work with embedded PharmD to address medication cost concerns.  Interventions: . Patient interviewed and appropriate assessments performed . Determined the patient is having difficulty obtaining diabetic supplies due to lack of transportation "[facility] but [facility]'t" . Informed by the patient it is a tough time financially and she is having difficulty affording co-pays as well . Provided patient with information about [facility] who does offer medication delivery . Collaboration with [employer] via a three way call to assist the patient with transferring prescriptions. Informed by the pharmacist [facility] would [facility] and would return patient call regarding fill status and delivery options . Provided the patient with the contact number to [place] [phone])  . Advised the patient to contact the pharmacy if she is not contacted by 2pm to confirm status of prescriptions . Collaboration with embedded PharmD Lottie Dawson via in-basket message requesting follow up call to the patient to assist with cost concerns . Scheduled follow up call to the patient over the next day to assess  outcome of medication delivery  Patient Self Care Activities:  . Attends all scheduled provider appointments . Calls pharmacy for medication refills . Performs ADL's independently  Initial goal documentation     . "I want more transportation options" (pt-stated)   On track    Current Barriers:  . Limited ability to drive self due to past incident of becoming lost in the rain . Limited knowledge of available community  resources to assist with transportation  Clinical Social Work Clinical Goal(s):  [name] Over the next 45 days, client will follow up with [facility] as directed by SW  Interventions: . Outbound call to the patient to assess status of patient stated goal . Determined the patient has successfully completed and returned Secondary school teacher via mail to [employer] [place]  Patient Self Care Activities:  . Currently UNABLE TO independently drive self to physician appointments  Please see past updates related to this goal by clicking on the "Past Updates" button in the selected goal          Follow Up Plan: SW will follow up with patient by phone over the next day to confirm medications successfuly transfered to appropriate pharmacy.   Bevelyn Ngo, BSW, CDP Social Worker, Certified Dementia Practitioner [place] [facility] [phone]

## 2019-06-08 NOTE — Patient Instructions (Signed)
Visit Information  Goals Addressed            This Visit's Progress     Patient Stated   . "I need help with my medicine" (pt-stated)       Current Barriers:  . Financial constraints related to medication costs . Transportation barriers  Clinical Social Work Clinical Goal(s):  [name] Over the next 5 days, patient  will follow up with [facility] as directed by SW . Over the next 45 days the patient will work with embedded PharmD to address medication cost concerns.  Interventions: . Patient interviewed and appropriate assessments performed . Determined the patient is having difficulty obtaining diabetic supplies due to lack of transportation "[facility] me but [facility] won't" . Informed by the patient it is a tough time financially and she is having difficulty affording co-pays as well . Provided patient with information about [facility] who does offer medication delivery . Collaboration with [employer] via a three way call to assist the patient with transferring prescriptions. Informed by the pharmacist [facility] would [facility] and would return patient call regarding fill status and delivery options . Provided the patient with the contact number to [place] [phone])  . Advised the patient to contact the pharmacy if she is not contacted by 2pm to confirm status of prescriptions . Collaboration with embedded PharmD Lottie Dawson via in-basket message requesting follow up call to the patient to assist with cost concerns . Scheduled follow up call to the patient over the next day to assess outcome of medication delivery  Patient Self Care Activities:  . Attends all scheduled provider appointments . Calls pharmacy for medication refills . Performs ADL's independently  Initial goal documentation     . "I want more transportation options" (pt-stated)   On track    Current Barriers:  . Limited ability to drive  self due to past incident of becoming lost in the rain . Limited knowledge of available community resources to assist with transportation  Clinical Social Work Clinical Goal(s):  [name] Over the next 45 days, client will follow up with [facility] as directed by SW  Interventions: . Outbound call to the patient to assess status of patient stated goal . Determined the patient has successfully completed and returned Product/process development scientist via mail to [employer] [place]  Patient Self Care Activities:  . Currently UNABLE TO independently drive self to physician appointments  Please see past updates related to this goal by clicking on the "Past Updates" button in the selected goal         The patient verbalized understanding of instructions provided today and declined a print copy of patient instruction materials.   SW will follow up with the patient over the next day to confirm prescriptions have been successfully transferred to the appropriate pharmacy.  Daneen Schick, BSW, CDP Social Worker, Certified Dementia Practitioner [place] / [place] [facility] [phone]

## 2019-06-09 ENCOUNTER — Ambulatory Visit: Payer: Self-pay

## 2019-06-09 DIAGNOSIS — I10 Essential (primary) hypertension: Secondary | ICD-10-CM

## 2019-06-09 DIAGNOSIS — E119 Type 2 diabetes mellitus without complications: Secondary | ICD-10-CM

## 2019-06-09 NOTE — Chronic Care Management (AMB) (Signed)
Care Management   Social Work Follow Up Note  06/09/2019 Name: [name] MRN: [id number] DOB: 1956/07/24  [name] is a 63 y.o. year old female who is a primary care patient of Arnette Felts, FNP. The CCM team was consulted for assistance with care coordination.  Review of patient status, including review of consultants reports, other relevant assessments, and collaboration with appropriate care team members and the patient's provider was performed as part of comprehensive patient evaluation and provision of chronic care management services.    I placed an outbound call to the patient to confirm successful receipt of prescription delivery from [facility]. Unfortunately, the patient has yet to receive needed items. See care plan below for interventions:  Outpatient Encounter Medications as of 06/09/2019  Medication Sig  . amLODipine (NORVASC) 5 MG tablet TAKE 1 TABLET EVERY DAY  . aspirin EC 325 MG EC tablet Take 1 tablet (325 mg total) by mouth daily. Take x 1 month post op to decrease risk of blood clots.  [name] atorvastatin (LIPITOR) 10 MG tablet Take 1 tablet (10 mg total) by mouth daily.  . BD PEN NEEDLE NANO U/F 32G X 4 MM MISC USE FOR 1 INJECTION DAILY  . Continuous Blood Gluc Receiver (FREESTYLE LIBRE READER) DEVI 1 each by Does not apply route 4 (four) times daily.  . Continuous Blood Gluc Sensor (FREESTYLE LIBRE SENSOR SYSTEM) MISC 1 each by Does not apply route 4 (four) times daily.  . diclofenac sodium (VOLTAREN) 1 % GEL Apply 2 g topically 4 (four) times daily.  . Insulin Degludec-Liraglutide (XULTOPHY) 100-3.6 UNIT-MG/ML SOPN Inject 24 Units into the skin at bedtime.  . valsartan (DIOVAN) 160 MG tablet TAKE 1 TABLET BY MOUTH EVERY DAY   No facility-administered encounter medications on file as of 06/09/2019.      Goals Addressed            This Visit's Progress     Patient Stated   . "I need help with my medicine" (pt-stated)       Current Barriers:   . Financial constraints related to medication costs . Transportation barriers  Clinical Social Work Clinical Goal(s):  [name] Over the next 5 days, patient  will follow up with [facility] as directed by SW . Over the next 45 days the patient will work with embedded PharmD to address medication cost concerns.  CCM SW Interventions: Completed 06/09/2019 . Outbound call to the patient to confirm prescriptions received from [facility] . Determined the patient has yet to hear back from the pharmacist regarding delivery of needed items . Unsuccessful outbound call placed to [employer]. Voice message left requesting follow up call to either the patient or SW regarding patient prescription needs . Collaboration with embedded PharmD to provide an update of today's interventions   Completed 06/08/2019 . Patient interviewed and appropriate assessments performed . Determined the patient is having difficulty obtaining diabetic supplies due to lack of transportation "[facility] but [facility]" . Informed by the patient it is a tough time financially and she is having difficulty affording co-pays as well . Provided patient with information about [facility] who does offer medication delivery . Collaboration with [employer] via a three way call to assist the patient with transferring prescriptions. Informed by the pharmacist [facility] and would return patient call regarding fill status and delivery options . Provided the patient  with the contact number to [facility] [phone])  . Advised the patient to contact the pharmacy if she is not contacted by 2pm to confirm status of prescriptions . Collaboration with embedded PharmD Lottie Dawson via in-basket message requesting follow up call to the patient to assist with cost concerns . Scheduled follow up call to the patient over the next day to assess outcome of  medication delivery  Patient Self Care Activities:  . Attends all scheduled provider appointments . Calls pharmacy for medication refills . Performs ADL's independently  Initial goal documentation         Follow Up Plan: SW will follow up with patient by phone over the next day to assess outcome of pharmacy needs.   Daneen Schick, BSW, CDP Social Worker, Certified Dementia Practitioner [place] [facility] [place] [facility] [phone]

## 2019-06-09 NOTE — Patient Instructions (Signed)
Visit Information  Goals Addressed            This Visit's Progress     Patient Stated   . "I need help with my medicine" (pt-stated)       Current Barriers:  . Financial constraints related to medication costs . Transportation barriers  Clinical Social Work Clinical Goal(s):  [name] Over the next 5 days, patient  will follow up with [facility] as directed by SW . Over the next 45 days the patient will work with embedded PharmD to address medication cost concerns.  CCM SW Interventions: Completed 06/09/2019 . Outbound call to the patient to confirm prescriptions received from [place] . Determined the patient has yet to hear back from the pharmacist regarding delivery of needed items . Unsuccessful outbound call placed to [employer]. Voice message left requesting follow up call to either the patient or SW regarding patient prescription needs . Collaboration with embedded PharmD to provide an update of today's interventions   Completed 06/08/2019 . Patient interviewed and appropriate assessments performed . Determined the patient is having difficulty obtaining diabetic supplies due to lack of transportation "I am [facility] me but they won't" . Informed by the patient it is a tough time financially and she is having difficulty affording co-pays as well . Provided patient with information about [facility] who does offer medication delivery . Collaboration with [employer] via a three way call to assist the patient with transferring prescriptions. Informed by the pharmacist [facility] would [facility] and would return patient call regarding fill status and delivery options . Provided the patient with the contact number to [facility] [phone])  . Advised the patient to contact the pharmacy if she is not contacted by 2pm to confirm status of prescriptions . Collaboration with embedded PharmD Lottie Dawson via  in-basket message requesting follow up call to the patient to assist with cost concerns . Scheduled follow up call to the patient over the next day to assess outcome of medication delivery  Patient Self Care Activities:  . Attends all scheduled provider appointments . Calls pharmacy for medication refills . Performs ADL's independently  Initial goal documentation        SW will follow up with the patient over the next day to assess outcome of today's interventions.  Daneen Schick, BSW, CDP Social Worker, Certified Dementia Practitioner [place] [facility] [place] [facility] [phone]

## 2019-06-10 ENCOUNTER — Ambulatory Visit: Payer: Self-pay

## 2019-06-10 ENCOUNTER — Ambulatory Visit: Payer: Self-pay | Admitting: Pharmacist

## 2019-06-10 ENCOUNTER — Other Ambulatory Visit: Payer: Self-pay

## 2019-06-10 DIAGNOSIS — E119 Type 2 diabetes mellitus without complications: Secondary | ICD-10-CM

## 2019-06-10 DIAGNOSIS — I10 Essential (primary) hypertension: Secondary | ICD-10-CM

## 2019-06-10 MED ORDER — BD PEN NEEDLE NANO U/F 32G X 4 MM MISC: 1 refills | Status: DC

## 2019-06-10 MED ORDER — AMLODIPINE BESYLATE 5 MG PO TABS: 5.0000 mg | ORAL_TABLET | Freq: Every day | ORAL | 1 refills | Status: DC

## 2019-06-10 NOTE — Patient Instructions (Signed)
Visit Information  Goals Addressed            This Visit's Progress     Patient Stated   . "I need help with my medicine" (pt-stated)       Current Barriers:  . Financial constraints related to medication costs . Transportation barriers  Clinical Social Work Clinical Goal(s):  [name] Over the next 5 days, patient  will follow up with [facility] as directed by SW . Over the next 45 days the patient will work with embedded PharmD to address medication cost concerns.  CCM SW Interventions: Completed 06/10/2019 . Outbound call to the patient to assess progress of obtaining prescription medications . Determined the patient has been in touch with the pharmacist at [place] but reports some of her prescriptions were not [facility] "they still don't have my needles or my amlodipine and that is what I really need" . Advised the patient SW would coordinate with provider office requesting orders be sent directly to [facility] . Collaboration with Lottie Dawson PharmD who assisted with coordinating prescription requests from patients primary provider   Patient Self Care Activities:  . Attends all scheduled provider appointments . Calls pharmacy for medication refills . Performs ADL's independently  Please see past updates related to this goal by clicking on the "Past Updates" button in the selected goal         Daneen Schick, BSW, CDP Social Worker, Certified Dementia Practitioner [place] [facility] [place] [facility] [phone]

## 2019-06-10 NOTE — Chronic Care Management (AMB) (Signed)
Care Management   Social Work Follow Up Note  06/10/2019 Name: [name] MRN: [id number] DOB: 1956/07/26  [name] is a 63 y.o. year old female who is a primary care patient of [name], [place]. The CCM team was consulted for assistance with [employer] .   Review of patient status, including review of consultants reports, other relevant assessments, and collaboration with appropriate care team members and the patient's provider was performed as part of comprehensive patient evaluation and provision of chronic care management services.    I placed an outbound call to the patient to assess progression of patients ability to receive prescriptions from [employer]. See care plan below:  Outpatient Encounter Medications as of 06/10/2019  Medication Sig  . amLODipine (NORVASC) 5 MG tablet TAKE 1 TABLET EVERY DAY  . aspirin EC 325 MG EC tablet Take 1 tablet (325 mg total) by mouth daily. Take x 1 month post op to decrease risk of blood clots.  [name] atorvastatin (LIPITOR) 10 MG tablet Take 1 tablet (10 mg total) by mouth daily.  . BD PEN NEEDLE NANO U/F 32G X 4 MM MISC USE FOR 1 INJECTION DAILY  . Continuous Blood Gluc Receiver (FREESTYLE LIBRE READER) DEVI 1 each by Does not apply route 4 (four) times daily.  . Continuous Blood Gluc Sensor (FREESTYLE LIBRE SENSOR SYSTEM) MISC 1 each by Does not apply route 4 (four) times daily.  . diclofenac sodium (VOLTAREN) 1 % GEL Apply 2 g topically 4 (four) times daily.  . Insulin Degludec-Liraglutide (XULTOPHY) 100-3.6 UNIT-MG/ML SOPN Inject 24 Units into the skin at bedtime.  . valsartan (DIOVAN) 160 MG tablet TAKE 1 TABLET BY MOUTH EVERY DAY   No facility-administered encounter medications on file as of 06/10/2019.      Goals Addressed            This Visit's Progress     Patient Stated   . "I need help with my medicine" (pt-stated)       Current Barriers:  . Financial constraints related to medication costs .  Transportation barriers  Clinical Social Work Clinical Goal(s):  [name] Over the next 5 days, patient  will follow up with [facility] as directed by SW . Over the next 45 days the patient will work with embedded PharmD to address medication cost concerns.  CCM SW Interventions: Completed 06/10/2019 . Outbound call to the patient to assess progress of obtaining prescription medications . Determined the patient has been in touch with the pharmacist at [place] but reports some of her prescriptions were not [facility] "they still don't have my needles or my amlodipine and that is what I really need" . Advised the patient SW would coordinate with provider office requesting orders be sent directly to [facility] . Collaboration with Lottie Dawson PharmD who assisted with coordinating prescription requests from patients primary provider   Patient Self Care Activities:  . Attends all scheduled provider appointments . Calls pharmacy for medication refills . Performs ADL's independently  Please see past updates related to this goal by clicking on the "Past Updates" button in the selected goal          Follow Up Plan: The patient will be contacted by embedded PharmD as scheduled. SW will follow up with the patient regarding transportation needs over the next 3 weeks.   Daneen Schick, BSW, CDP Social Worker, Certified Dementia Practitioner [place] [facility] [place] [facility] [phone]

## 2019-06-11 ENCOUNTER — Ambulatory Visit: Payer: Self-pay | Admitting: Pharmacist

## 2019-06-11 NOTE — Progress Notes (Signed)
  Chronic Care Management   Outreach Note  06/11/2019 Name: [name] MRN: [id number] DOB: [birth year]  Referred by: [name], FNP Reason for referral : Chronic Care Management   An unsuccessful telephone outreach was attempted today. The patient was referred to the case management team by for assistance with care management and care coordination.   Follow Up Plan: A HIPPA compliant phone message was left for the patient providing contact information and requesting a return call.  The care management team will reach out to the patient again over the next 5-7 business  days.   Regina Eck, PharmD, BCPS Clinical Pharmacist, [facility] [place]: [phone]

## 2019-06-12 ENCOUNTER — Ambulatory Visit: Payer: Self-pay

## 2019-06-12 ENCOUNTER — Ambulatory Visit: Payer: Self-pay | Admitting: Pharmacist

## 2019-06-12 DIAGNOSIS — E119 Type 2 diabetes mellitus without complications: Secondary | ICD-10-CM

## 2019-06-12 DIAGNOSIS — I10 Essential (primary) hypertension: Secondary | ICD-10-CM

## 2019-06-12 NOTE — Patient Instructions (Signed)
Visit Information  Goals Addressed            This Visit's Progress     Patient Stated   . COMPLETED: "I need help with my medicine" (pt-stated)       Current Barriers:  . Financial constraints related to medication costs . Transportation barriers  Clinical Social Work Clinical Goal(s):  [name] Over the next 5 days, patient  will follow up with [facility] as directed by SW . Over the next 45 days the patient will work with embedded PharmD to address medication cost concerns.  CCM SW Interventions: Completed 06/12/2019 . Inbound call from the patient who reports receipt of all needed medications from [place] . Reports from the patient co-pays are cheaper which she is grateful for . Encouraged the patient to contact embedded PharmD for future medication needs  CCM PharmD Interventions: Completed 06/12/2019 -Refills called into [facility] for pen needles and amlodipine.  Patient appears to have better pricing at this pharmacy. -She states all of her medications have been transferred and taken care of -Patient reports blood pressure and blood sugar are within normal range -Encouraged patient reach out if needs arise  Patient Self Care Activities:  . Attends all scheduled provider appointments . Calls pharmacy for medication refills . Performs ADL's independently  Please see past updates related to this goal by clicking on the "Past Updates" button in the selected goal          SW will continue to follow to assist with transportation resource needs.  Daneen Schick, BSW, CDP Social Worker, Certified Dementia Practitioner [place] [facility] [place] [facility] [phone]

## 2019-06-12 NOTE — Chronic Care Management (AMB) (Signed)
Chronic Care Management   Social Work Follow Up Note  06/12/2019 Name: [name] MRN: [id number] DOB: July 06, 1956  [name] is a 63 y.o. year old female who is a primary care patient of [name], [place]. The CCM team was consulted for assistance with Transportation Needs .   Review of patient status, including review of consultants reports, other relevant assessments, and collaboration with appropriate care team members and the patient's provider was performed as part of comprehensive patient evaluation and provision of chronic care management services.    I received an inbound call from the patient who reports successful delivery of medications from [employer]. See care plan below.  Outpatient Encounter Medications as of 06/12/2019  Medication Sig  . amLODipine (NORVASC) 5 MG tablet Take 1 tablet (5 mg total) by mouth daily.  [name] aspirin EC 325 MG EC tablet Take 1 tablet (325 mg total) by mouth daily. Take x 1 month post op to decrease risk of blood clots.  [name] atorvastatin (LIPITOR) 10 MG tablet Take 1 tablet (10 mg total) by mouth daily.  . Continuous Blood Gluc Receiver (FREESTYLE LIBRE READER) DEVI 1 each by Does not apply route 4 (four) times daily.  . Continuous Blood Gluc Sensor (FREESTYLE LIBRE SENSOR SYSTEM) MISC 1 each by Does not apply route 4 (four) times daily.  . diclofenac sodium (VOLTAREN) 1 % GEL Apply 2 g topically 4 (four) times daily.  . Insulin Degludec-Liraglutide (XULTOPHY) 100-3.6 UNIT-MG/ML SOPN Inject 24 Units into the skin at bedtime.  . Insulin Pen Needle (BD PEN NEEDLE NANO U/F) 32G X 4 MM MISC USE FOR 1 INJECTION DAILY  . valsartan (DIOVAN) 160 MG tablet TAKE 1 TABLET BY MOUTH EVERY DAY   No facility-administered encounter medications on file as of 06/12/2019.      Goals Addressed            This Visit's Progress     Patient Stated   . COMPLETED: "I need help with my medicine" (pt-stated)       Current Barriers:  . Financial  constraints related to medication costs . Transportation barriers  Clinical Social Work Clinical Goal(s):  [name] Over the next 5 days, patient  will follow up with [facility] as directed by SW . Over the next 45 days the patient will work with embedded PharmD to address medication cost concerns.  CCM SW Interventions: Completed 06/12/2019 . Inbound call from the patient who reports receipt of all needed medications from [place] . Reports from the patient co-pays are cheaper which she is grateful for . Encouraged the patient to contact embedded PharmD for future medication needs  CCM PharmD Interventions: Completed 06/12/2019 -Refills called into [facility] for pen needles and amlodipine.  Patient appears to have better pricing at this pharmacy. -She states all of her medications have been transferred and taken care of -Patient reports blood pressure and blood sugar are within normal range -Encouraged patient reach out if needs arise  Patient Self Care Activities:  . Attends all scheduled provider appointments . Calls pharmacy for medication refills . Performs ADL's independently  Please see past updates related to this goal by clicking on the "Past Updates" button in the selected goal          Follow Up Plan: SW will continue to follow to assist with transportation resource needs.   Daneen Schick, BSW, CDP Social Worker, Certified Dementia Practitioner [place] [facility] [place] [facility] [phone]

## 2019-06-12 NOTE — Progress Notes (Signed)
Chronic Care Management   Initial Visit Note  06/12/2019 Name: [name] MRN: [id number] DOB: [birth year]  Referred by: Arnette Felts, FNP Reason for referral : Chronic Care Management   ARELLA BLINDER is a 63 y.o. year old female who is a primary care patient of Arnette Felts, FNP. The CCM team was consulted for assistance with chronic disease management and care coordination needs.   Medications: Outpatient Encounter Medications as of 06/12/2019  Medication Sig  . amLODipine (NORVASC) 5 MG tablet Take 1 tablet (5 mg total) by mouth daily.  [name] aspirin EC 325 MG EC tablet Take 1 tablet (325 mg total) by mouth daily. Take x 1 month post op to decrease risk of blood clots.  [name] atorvastatin (LIPITOR) 10 MG tablet Take 1 tablet (10 mg total) by mouth daily.  . Continuous Blood Gluc Receiver (FREESTYLE LIBRE READER) DEVI 1 each by Does not apply route 4 (four) times daily.  . Continuous Blood Gluc Sensor (FREESTYLE LIBRE SENSOR SYSTEM) MISC 1 each by Does not apply route 4 (four) times daily.  . diclofenac sodium (VOLTAREN) 1 % GEL Apply 2 g topically 4 (four) times daily.  . Insulin Degludec-Liraglutide (XULTOPHY) 100-3.6 UNIT-MG/ML SOPN Inject 24 Units into the skin at bedtime.  . Insulin Pen Needle (BD PEN NEEDLE NANO U/F) 32G X 4 MM MISC USE FOR 1 INJECTION DAILY  . valsartan (DIOVAN) 160 MG tablet TAKE 1 TABLET BY MOUTH EVERY DAY   No facility-administered encounter medications on file as of 06/12/2019.      Objective:   Goals Addressed            This Visit's Progress     Patient Stated   . "I need help with my medicine" (pt-stated)       Current Barriers:  . Financial constraints related to medication costs . Transportation barriers  Clinical Social Work Clinical Goal(s):  [name] Over the next 5 days, patient  will follow up with [facility] as directed by SW . Over the next 45 days the patient will work with embedded PharmD to address medication cost concerns.   CCM SW Interventions: Completed 06/10/2019 . Outbound call to the patient to assess progress of obtaining prescription medications . Determined the patient has been in touch with the pharmacist at [facility] but reports some of her [facility] "they still don't have my needles or my amlodipine and that is what I really need" . Advised the patient SW would coordinate with provider office requesting orders be sent directly to [facility] . Collaboration with Vanice Sarah PharmD who assisted with coordinating prescription requests from patients primary provider   CCM PharmD Interventions: Completed 06/12/2019 -Refills called into [facility] for pen needles and amlodipine.  Patient appears to have better pricing at this pharmacy. -She states all of her medications have been transferred and taken care of -Patient reports blood pressure and blood sugar are within normal range -Encouraged patient reach out if needs arise  Patient Self Care Activities:  . Attends all scheduled provider appointments . Calls pharmacy for medication refills . Performs ADL's independently  Please see past updates related to this goal by clicking on the "Past Updates" button in the selected goal         Plan:    No further follow up required by [facility].  Will assist in the future as needed  Kieth Brightly, PharmD, BCPS Clinical Pharmacist, [facility]  [place]: [phone]

## 2019-06-12 NOTE — Progress Notes (Signed)
  Chronic Care Management   Outreach Note  06/12/2019 Name: [name] MRN: [id number] DOB: [birth year]  Referred by: [name], FNP Reason for referral : Chronic Care Management   An unsuccessful telephone outreach was attempted today. The patient was referred to the case management team by for assistance with care management and care coordination.   Follow Up Plan: The care management team will reach out to the patient again over the next 5-7 business days.    Regina Eck, PharmD, BCPS Clinical Pharmacist, [facility] [place]: [phone]

## 2019-06-15 NOTE — Progress Notes (Signed)
This encounter was created in error - please disregard.

## 2019-06-17 ENCOUNTER — Encounter (INDEPENDENT_AMBULATORY_CARE_PROVIDER_SITE_OTHER): Payer: BC Managed Care – PPO | Admitting: Ophthalmology

## 2019-06-18 ENCOUNTER — Telehealth: Payer: Self-pay

## 2019-06-25 ENCOUNTER — Other Ambulatory Visit: Payer: Self-pay

## 2019-06-25 ENCOUNTER — Encounter (INDEPENDENT_AMBULATORY_CARE_PROVIDER_SITE_OTHER): Payer: BC Managed Care – PPO | Admitting: Ophthalmology

## 2019-06-25 DIAGNOSIS — H35033 Hypertensive retinopathy, bilateral: Secondary | ICD-10-CM

## 2019-06-25 DIAGNOSIS — H34813 Central retinal vein occlusion, bilateral, with macular edema: Secondary | ICD-10-CM | POA: Diagnosis not present

## 2019-06-25 DIAGNOSIS — H43813 Vitreous degeneration, bilateral: Secondary | ICD-10-CM

## 2019-06-25 DIAGNOSIS — E113313 Type 2 diabetes mellitus with moderate nonproliferative diabetic retinopathy with macular edema, bilateral: Secondary | ICD-10-CM

## 2019-06-25 DIAGNOSIS — I10 Essential (primary) hypertension: Secondary | ICD-10-CM

## 2019-06-25 DIAGNOSIS — E11311 Type 2 diabetes mellitus with unspecified diabetic retinopathy with macular edema: Secondary | ICD-10-CM

## 2019-06-30 ENCOUNTER — Ambulatory Visit: Payer: Self-pay

## 2019-06-30 DIAGNOSIS — E119 Type 2 diabetes mellitus without complications: Secondary | ICD-10-CM

## 2019-06-30 DIAGNOSIS — I10 Essential (primary) hypertension: Secondary | ICD-10-CM
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
## 2019-07-15 ENCOUNTER — Other Ambulatory Visit: Payer: Self-pay | Admitting: Nurse Practitioner

## 2019-07-23 ENCOUNTER — Encounter (INDEPENDENT_AMBULATORY_CARE_PROVIDER_SITE_OTHER): Payer: BC Managed Care – PPO | Admitting: Ophthalmology

## 2019-07-23 DIAGNOSIS — H34812 Central retinal vein occlusion, left eye, with macular edema: Secondary | ICD-10-CM

## 2019-07-23 DIAGNOSIS — I10 Essential (primary) hypertension: Secondary | ICD-10-CM | POA: Diagnosis not present

## 2019-07-23 DIAGNOSIS — H35033 Hypertensive retinopathy, bilateral: Secondary | ICD-10-CM

## 2019-07-23 DIAGNOSIS — E11311 Type 2 diabetes mellitus with unspecified diabetic retinopathy with macular edema: Secondary | ICD-10-CM | POA: Diagnosis not present

## 2019-07-23 DIAGNOSIS — H43813 Vitreous degeneration, bilateral: Secondary | ICD-10-CM

## 2019-07-23 DIAGNOSIS — E113313 Type 2 diabetes mellitus with moderate nonproliferative diabetic retinopathy with macular edema, bilateral: Secondary | ICD-10-CM | POA: Diagnosis not present

## 2019-07-23 DIAGNOSIS — H2513 Age-related nuclear cataract, bilateral: Secondary | ICD-10-CM

## 2019-07-27 ENCOUNTER — Telehealth: Payer: Self-pay

## 2019-07-28 ENCOUNTER — Ambulatory Visit (INDEPENDENT_AMBULATORY_CARE_PROVIDER_SITE_OTHER): Payer: BC Managed Care – PPO | Admitting: Nurse Practitioner

## 2019-07-28 ENCOUNTER — Encounter: Payer: Self-pay | Admitting: Nurse Practitioner

## 2019-07-28 ENCOUNTER — Other Ambulatory Visit: Payer: Self-pay

## 2019-07-28 VITALS — BP 148/84 | HR 64 | Temp 98.4°F | Ht 63.2 in | Wt 204.6 lb

## 2019-07-28 DIAGNOSIS — Z0000 Encounter for general adult medical examination without abnormal findings: Secondary | ICD-10-CM | POA: Diagnosis not present

## 2019-07-28 DIAGNOSIS — Z1211 Encounter for screening for malignant neoplasm of colon: Secondary | ICD-10-CM | POA: Diagnosis not present

## 2019-07-28 DIAGNOSIS — Z23 Encounter for immunization: Secondary | ICD-10-CM | POA: Diagnosis not present

## 2019-07-28 DIAGNOSIS — E119 Type 2 diabetes mellitus without complications: Secondary | ICD-10-CM | POA: Diagnosis not present

## 2019-07-28 DIAGNOSIS — E559 Vitamin D deficiency, unspecified: Secondary | ICD-10-CM

## 2019-07-28 DIAGNOSIS — I10 Essential (primary) hypertension: Secondary | ICD-10-CM

## 2019-07-28 DIAGNOSIS — R82998 Other abnormal findings in urine: Secondary | ICD-10-CM

## 2019-07-28 LAB — POCT URINALYSIS DIPSTICK
Bilirubin, UA: NEGATIVE
Glucose, UA: NEGATIVE
Ketones, UA: NEGATIVE
Nitrite, UA: NEGATIVE
Protein, UA: POSITIVE — AB
Spec Grav, UA: >=1.03 — AB (ref 1.010–1.025)
Urobilinogen, UA: 0.2 E.U./dL
pH, UA: 5 (ref 5.0–8.0)

## 2019-07-28 LAB — POCT UA - MICROALBUMIN
Creatinine, POC: 300 mg/dL
Microalbumin Ur, POC: 150 mg/L

## 2019-07-28 MED ORDER — TETANUS-DIPHTH-ACELL PERTUSSIS 5-2.5-18.5 LF-MCG/0.5 IM SUSP: 0.5000 mL | Freq: Once | INTRAMUSCULAR | 0 refills | Status: AC

## 2019-07-28 NOTE — Progress Notes (Signed)
Subjective:     Patient ID: [name] , female    DOB: 1956/01/26 , 63 y.o.   MRN: [id number]   Chief Complaint  Patient presents with  . Annual Exam    HPI  Here for HM    The patient states she uses status post hysterectomy for birth control.  Mammogram last done 2020, [facility].  Negative for: breast discharge, breast lump(s), breast pain and breast self exam.  Pertinent negatives include abnormal bleeding (hematology), anxiety, decreased libido, depression, difficulty falling sleep, dyspareunia, history of infertility, nocturia, sexual dysfunction, sleep disturbances, urinary incontinence, urinary urgency, vaginal discharge and vaginal itching. Diet regular, trying to avoid beef and does not eat pork.  The patient states her exercise level is moderate, she is working a challenge with chair exercises and situps    The patient's tobacco use is:  Social History   Tobacco Use  Smoking Status Current Every Day Smoker  . Packs/day: 1.00  . Types: Cigarettes  Smokeless Tobacco Never Used   She has been exposed to passive smoke. The patient's alcohol use is:  Social History   Substance and Sexual Activity  Alcohol Use No   Comment: opccasionally     Past Medical History:  Diagnosis Date  . Back pain   . Diabetes mellitus without complication ([place])   . Hypertension   . Olecranon bursitis of left elbow      Family History  Problem Relation Age of Onset  . Diabetes Mother   . Hypertension Mother   . Diabetes Father   . Hypertension Father      Current Outpatient Medications:  .  amLODipine (NORVASC) 5 MG tablet, Take 1 tablet (5 mg total) by mouth daily., Disp: 90 tablet, Rfl: 1 .  aspirin EC 325 MG EC tablet, Take 1 tablet (325 mg total) by mouth daily. Take x 1 month post op to decrease risk of blood clots., Disp: 30 tablet, Rfl: 0 .  atorvastatin (LIPITOR) 10 MG tablet, Take 1 tablet (10 mg total) by mouth daily., Disp: 30 tablet, Rfl:  2 .  Continuous Blood Gluc Receiver (FREESTYLE LIBRE READER) DEVI, 1 each by Does not apply route 4 (four) times daily., Disp: 1 Device, Rfl: 0 .  Continuous Blood Gluc Sensor ([place]) MISC, 1 each by Does not apply route 4 (four) times daily., Disp: 6 each, Rfl: 3 .  diclofenac sodium (VOLTAREN) 1 % GEL, Apply 2 g topically 4 (four) times daily., Disp: 100 g, Rfl: 1 .  Insulin Degludec-Liraglutide (XULTOPHY) 100-3.6 UNIT-MG/ML SOPN, Inject 24 Units into the skin at bedtime., Disp: 5 pen, Rfl: 3 .  Insulin Pen Needle (BD PEN NEEDLE NANO U/F) 32G X 4 MM MISC, USE FOR 1 INJECTION DAILY, Disp: 100 each, Rfl: 1 .  valsartan (DIOVAN) 160 MG tablet, TAKE ONE TABLET BY MOUTH ONCE DAILY, Disp: 90 tablet, Rfl: 0   No Known Allergies   Review of Systems  Constitutional: Negative.   HENT: Negative.   Eyes: Negative.   Respiratory: Negative.   Cardiovascular: Negative.   Gastrointestinal: Negative.   Endocrine: Negative.   Genitourinary: Negative.   Musculoskeletal: Negative.   Skin: Negative.   Allergic/Immunologic: Negative.   Neurological: Negative.   Hematological: Negative.   Psychiatric/Behavioral: Negative.      Today's Vitals   07/28/19 1054  BP: (!) 148/84  Pulse: 64  Temp: 98.4 F (36.9 C)  TempSrc: Oral  Weight: 204 lb 9.6 oz (  92.8 kg)  Height: 5' 3.2" (1.605 m)   Body mass index is 36.01 kg/m.   Objective:  Physical Exam Vitals signs reviewed.  Constitutional:      General: She is not in acute distress.    Appearance: Normal appearance. She is well-developed. She is obese.  HENT:     Head: Normocephalic and atraumatic.     Right Ear: Hearing, tympanic membrane, ear canal and external ear normal. There is no impacted cerumen.     Left Ear: Hearing, tympanic membrane, ear canal and external ear normal. There is no impacted cerumen.  Eyes:     General: Lids are normal.     Conjunctiva/sclera: Conjunctivae normal.     Pupils: Pupils are equal,  round, and reactive to light.     Funduscopic exam:    Right eye: No papilledema.        Left eye: No papilledema.  Neck:     Musculoskeletal: Full passive range of motion without pain, normal range of motion and neck supple.     Thyroid: No thyroid mass.     Vascular: No carotid bruit.  Cardiovascular:     Rate and Rhythm: Normal rate and regular rhythm.     Pulses: Normal pulses.     Heart sounds: Normal heart sounds. No murmur.  Pulmonary:     Effort: Pulmonary effort is normal.     Breath sounds: Normal breath sounds.  Abdominal:     General: Abdomen is flat. Bowel sounds are normal.     Palpations: Abdomen is soft.  Musculoskeletal: Normal range of motion.        General: No swelling.     Right lower leg: No edema.     Left lower leg: No edema.     Comments: Using rolling seated walker  Skin:    General: Skin is warm and dry.     Capillary Refill: Capillary refill takes less than 2 seconds.  Neurological:     General: No focal deficit present.     Mental Status: She is alert and oriented to person, place, and time.     Cranial Nerves: No cranial nerve deficit.     Sensory: No sensory deficit.  Psychiatric:        Mood and Affect: Mood normal.        Behavior: Behavior normal.        Thought Content: Thought content normal.        Judgment: Judgment normal.         Assessment And Plan:     1. Health maintenance examination . Behavior modifications discussed and diet history reviewed.   . Pt will continue to exercise regularly and modify diet with low GI, plant based foods and decrease intake of processed foods.  . Recommend intake of daily multivitamin, Vitamin D, and calcium.  . Recommend mammogram and colonoscopy for preventive screenings, as well as recommend immunizations that include influenza, TDAP  2. Need for influenza vaccination  Influenza vaccine given in office  Advised to take Tylenol as needed for muscle aches or fever - Flu Vaccine QUAD 6+ mos  PF IM (Fluarix Quad PF)  3. Encounter for immunization  Will give tetanus vaccine today while in office. Refer to order management. TDAP will be administered to adults 15-56 years old every 10 years. - Tdap (BOOSTRIX) 5-2.5-18.5 LF-MCG/0.5 injection; Inject 0.5 mLs into the muscle once for 1 dose.  Dispense: 0.5 mL; Refill: 0  4. Encounter for screening colonoscopy  According to USPTF Colorectal cancer Screening guidelines. Colonoscopy is recommended every 10 years, starting at age 33years.  Will refer to GI for colon cancer screening. - Ambulatory referral to Gastroenterology  5. Type 2 diabetes mellitus without complication, without long-term current use of insulin (HCC) Chronic, fairly controlled Continue with current medications Encouraged to limit intake of sugary foods and drinks Encouraged to increase physical activity to 150 minutes per week as tolerated. - POCT Urinalysis Dipstick (81002) - POCT UA - Microalbumin - Hemoglobin A1c - CMP14+EGFR - Lipid Profile - CBC no Diff  6. Essential hypertension B/P is controlled.  CMP ordered to check renal function.  The importance of regular exercise and dietary modification was stressed to the patient.  Stressed importance of losing ten percent of her body weight to help with B/P control.  The weight loss would help with decreasing cardiac and cancer risk as well.  - EKG 12-Lead - CMP14+EGFR - CBC no Diff  7. Vitamin D deficiency  Will check vitamin D level and supplement as needed.     Also encouraged to spend 15 minutes in the sun daily.  - Vitamin D (25 hydroxy)    [name], FNP    THE PATIENT IS ENCOURAGED TO PRACTICE SOCIAL DISTANCING DUE TO THE COVID-19 PANDEMIC.

## 2019-07-29 ENCOUNTER — Other Ambulatory Visit: Payer: Self-pay | Admitting: Nurse Practitioner

## 2019-07-29 DIAGNOSIS — E559 Vitamin D deficiency, unspecified: Secondary | ICD-10-CM

## 2019-07-29 LAB — LIPID PANEL
Chol/HDL Ratio: 3.9 ratio (ref 0.0–4.4)
Cholesterol, Total: 160 mg/dL (ref 100–199)
HDL: 41 mg/dL (ref >39)
LDL Chol Calc (NIH): 99 mg/dL (ref 0–99)
Triglycerides: 112 mg/dL (ref 0–149)
VLDL Cholesterol Cal: 20 mg/dL (ref 5–40)

## 2019-07-29 LAB — CBC
Hematocrit: 38.5 % (ref 34.0–46.6)
Hemoglobin: 11.5 g/dL (ref 11.1–15.9)
MCH: 22.5 pg — ABNORMAL LOW (ref 26.6–33.0)
MCHC: 29.9 g/dL — ABNORMAL LOW (ref 31.5–35.7)
MCV: 75 fL — ABNORMAL LOW (ref 79–97)
Platelets: 300 10*3/uL (ref 150–450)
RBC: 5.12 x10E6/uL (ref 3.77–5.28)
RDW: 18.9 % — ABNORMAL HIGH (ref 11.7–15.4)
WBC: 14.1 10*3/uL — ABNORMAL HIGH (ref 3.4–10.8)

## 2019-07-29 LAB — CMP14+EGFR
ALT: 13 IU/L (ref 0–32)
AST: 14 IU/L (ref 0–40)
Albumin/Globulin Ratio: 1.5 (ref 1.2–2.2)
Albumin: 4.1 g/dL (ref 3.8–4.8)
Alkaline Phosphatase: 98 IU/L (ref 39–117)
BUN/Creatinine Ratio: 27 (ref 12–28)
BUN: 19 mg/dL (ref 8–27)
Bilirubin Total: 0.2 mg/dL (ref 0.0–1.2)
CO2: 25 mmol/L (ref 20–29)
Calcium: 9.3 mg/dL (ref 8.7–10.3)
Chloride: 105 mmol/L (ref 96–106)
Creatinine, Ser: 0.71 mg/dL (ref 0.57–1.00)
GFR calc Af Amer: 105 mL/min/{1.73_m2} (ref >59)
GFR calc non Af Amer: 91 mL/min/{1.73_m2} (ref >59)
Globulin, Total: 2.8 g/dL (ref 1.5–4.5)
Glucose: 73 mg/dL (ref 65–99)
Potassium: 4.2 mmol/L (ref 3.5–5.2)
Sodium: 143 mmol/L (ref 134–144)
Total Protein: 6.9 g/dL (ref 6.0–8.5)

## 2019-07-29 LAB — HEMOGLOBIN A1C
Est. average glucose Bld gHb Est-mCnc: 154 mg/dL
Hgb A1c MFr Bld: 7 % — ABNORMAL HIGH (ref 4.8–5.6)

## 2019-07-29 LAB — VITAMIN D 25 HYDROXY (VIT D DEFICIENCY, FRACTURES): Vit D, 25-Hydroxy: 20.4 ng/mL — ABNORMAL LOW (ref 30.0–100.0)

## 2019-07-29 MED ORDER — VITAMIN D (ERGOCALCIFEROL) 1.25 MG (50000 UNIT) PO CAPS: 50000.0000 [IU] | ORAL_CAPSULE | ORAL | 0 refills | Status: DC

## 2019-07-31 LAB — URINE CULTURE

## 2019-08-03 ENCOUNTER — Encounter: Payer: Self-pay | Admitting: Nurse Practitioner

## 2019-08-20 ENCOUNTER — Encounter (INDEPENDENT_AMBULATORY_CARE_PROVIDER_SITE_OTHER): Payer: BC Managed Care – PPO | Admitting: Ophthalmology

## 2019-08-21 LAB — WHITE BLOOD COUNT AND DIFFERENTIAL

## 2019-08-21 LAB — SPECIMEN STATUS REPORT

## 2019-08-27 ENCOUNTER — Ambulatory Visit: Payer: Self-pay

## 2019-08-27 ENCOUNTER — Telehealth: Payer: Self-pay

## 2019-08-27 DIAGNOSIS — E559 Vitamin D deficiency, unspecified: Secondary | ICD-10-CM

## 2019-08-27 DIAGNOSIS — I10 Essential (primary) hypertension: Secondary | ICD-10-CM

## 2019-08-27 DIAGNOSIS — E119 Type 2 diabetes mellitus without complications: Secondary | ICD-10-CM

## 2019-08-27 NOTE — Chronic Care Management (AMB) (Signed)
  Chronic Care Management   Outreach Note  08/27/2019 Name: [name] MRN: [id number] DOB: [birth year]  Referred by: [name], FNP Reason for referral : Care Coordination (INITIAL CC RNCM Telephone Outreach )   An unsuccessful telephone outreach was attempted today. The patient was referred to the case management team by [name] FNP for assistance with care management and care coordination.   Follow Up Plan: Telephone follow up appointment with care management team member scheduled for: 10/09/19  Barb Merino, RN, BSN, CCM Care Management Coordinator [facility]  Direct Phone: [phone]

## 2019-08-28 ENCOUNTER — Encounter (INDEPENDENT_AMBULATORY_CARE_PROVIDER_SITE_OTHER): Payer: BC Managed Care – PPO | Admitting: Ophthalmology

## 2019-08-29 ENCOUNTER — Other Ambulatory Visit: Payer: Self-pay | Admitting: Nurse Practitioner

## 2019-09-23 ENCOUNTER — Encounter (INDEPENDENT_AMBULATORY_CARE_PROVIDER_SITE_OTHER): Payer: BC Managed Care – PPO | Admitting: Ophthalmology

## 2019-10-03 ENCOUNTER — Other Ambulatory Visit: Payer: Self-pay | Admitting: Nurse Practitioner

## 2019-10-03 DIAGNOSIS — E559 Vitamin D deficiency, unspecified: Secondary | ICD-10-CM

## 2019-10-06 ENCOUNTER — Other Ambulatory Visit: Payer: Self-pay

## 2019-10-06 ENCOUNTER — Encounter (INDEPENDENT_AMBULATORY_CARE_PROVIDER_SITE_OTHER): Payer: BC Managed Care – PPO | Admitting: Ophthalmology

## 2019-10-06 DIAGNOSIS — H35033 Hypertensive retinopathy, bilateral: Secondary | ICD-10-CM

## 2019-10-06 DIAGNOSIS — E11311 Type 2 diabetes mellitus with unspecified diabetic retinopathy with macular edema: Secondary | ICD-10-CM

## 2019-10-06 DIAGNOSIS — H34812 Central retinal vein occlusion, left eye, with macular edema: Secondary | ICD-10-CM | POA: Diagnosis not present

## 2019-10-06 DIAGNOSIS — E113313 Type 2 diabetes mellitus with moderate nonproliferative diabetic retinopathy with macular edema, bilateral: Secondary | ICD-10-CM | POA: Diagnosis not present

## 2019-10-06 DIAGNOSIS — I10 Essential (primary) hypertension: Secondary | ICD-10-CM | POA: Diagnosis not present

## 2019-10-06 DIAGNOSIS — H43813 Vitreous degeneration, bilateral: Secondary | ICD-10-CM

## 2019-10-09 ENCOUNTER — Telehealth: Payer: Self-pay

## 2019-10-15 ENCOUNTER — Other Ambulatory Visit: Payer: Self-pay | Admitting: Nurse Practitioner

## 2019-11-02 ENCOUNTER — Ambulatory Visit: Payer: BC Managed Care – PPO | Admitting: Nurse Practitioner

## 2019-11-05 ENCOUNTER — Encounter (INDEPENDENT_AMBULATORY_CARE_PROVIDER_SITE_OTHER): Payer: BC Managed Care – PPO | Admitting: Ophthalmology

## 2019-11-05 ENCOUNTER — Telehealth: Payer: Self-pay

## 2019-11-05 ENCOUNTER — Other Ambulatory Visit: Payer: Self-pay

## 2019-11-05 ENCOUNTER — Other Ambulatory Visit: Payer: Self-pay | Admitting: Nurse Practitioner

## 2019-11-05 DIAGNOSIS — H34812 Central retinal vein occlusion, left eye, with macular edema: Secondary | ICD-10-CM

## 2019-11-05 DIAGNOSIS — E11311 Type 2 diabetes mellitus with unspecified diabetic retinopathy with macular edema: Secondary | ICD-10-CM | POA: Diagnosis not present

## 2019-11-05 DIAGNOSIS — H35033 Hypertensive retinopathy, bilateral: Secondary | ICD-10-CM | POA: Diagnosis not present

## 2019-11-05 DIAGNOSIS — H43813 Vitreous degeneration, bilateral: Secondary | ICD-10-CM

## 2019-11-05 DIAGNOSIS — I10 Essential (primary) hypertension: Secondary | ICD-10-CM

## 2019-11-05 DIAGNOSIS — E113313 Type 2 diabetes mellitus with moderate nonproliferative diabetic retinopathy with macular edema, bilateral: Secondary | ICD-10-CM

## 2019-11-05 NOTE — Telephone Encounter (Signed)
medication refill.

## 2019-11-09 ENCOUNTER — Encounter: Payer: Self-pay | Admitting: Nurse Practitioner

## 2019-11-09 ENCOUNTER — Other Ambulatory Visit: Payer: Self-pay

## 2019-11-09 ENCOUNTER — Telehealth (INDEPENDENT_AMBULATORY_CARE_PROVIDER_SITE_OTHER): Payer: BC Managed Care – PPO | Admitting: Nurse Practitioner

## 2019-11-09 VITALS — BP 120/75 | Wt 190.0 lb

## 2019-11-09 DIAGNOSIS — E559 Vitamin D deficiency, unspecified: Secondary | ICD-10-CM

## 2019-11-09 DIAGNOSIS — F329 Major depressive disorder, single episode, unspecified: Secondary | ICD-10-CM

## 2019-11-09 DIAGNOSIS — E119 Type 2 diabetes mellitus without complications: Secondary | ICD-10-CM

## 2019-11-09 DIAGNOSIS — I10 Essential (primary) hypertension: Secondary | ICD-10-CM | POA: Diagnosis not present

## 2019-11-09 DIAGNOSIS — Z794 Long term (current) use of insulin: Secondary | ICD-10-CM

## 2019-11-09 DIAGNOSIS — Z720 Tobacco use: Secondary | ICD-10-CM

## 2019-11-09 DIAGNOSIS — F32A Depression, unspecified: Secondary | ICD-10-CM

## 2019-11-09 MED ORDER — BUPROPION HCL ER (XL) 150 MG PO TB24: 150.0000 mg | ORAL_TABLET | ORAL | 2 refills | Status: DC

## 2019-11-09 NOTE — Progress Notes (Signed)
Virtual Visit via Telephone   This visit type was conducted due to national recommendations for restrictions regarding the COVID-19 Pandemic (e.g. social distancing) in an effort to limit this patient's exposure and mitigate transmission in our community.  Due to her co-morbid illnesses, this patient is at least at moderate risk for complications without adequate follow up.  This format is felt to be most appropriate for this patient at this time.  All issues noted in this document were discussed and addressed.  A limited physical exam was performed with this format.    This visit type was conducted due to national recommendations for restrictions regarding the COVID-19 Pandemic (e.g. social distancing) in an effort to limit this patient's exposure and mitigate transmission in our community.  Patients identity confirmed using two different identifiers.  This format is felt to be most appropriate for this patient at this time.  All issues noted in this document were discussed and addressed.  No physical exam was performed (except for noted visual exam findings with Video Visits).    Date:  11/22/2019   ID:  [name], DOB 11-01-1955, MRN [id number]  Patient Location:  Home - spoke with [name]  Provider location:   Office    Chief Complaint:  Diabetes follow up  History of Present Illness:    [name] is a 64 y.o. female who presents via telephone conferencing for a telehealth visit today.    The patient does not have symptoms concerning for COVID-19 infection (fever, chills, cough, or new shortness of breath).   She has been exposed to her niece who had covid in her house within the last week, she has self isolated herself since that time and does not have any symptoms  Diabetes She presents for her follow-up diabetic visit. She has type 2 diabetes mellitus. Her disease course has been worsening. There are no hypoglycemic associated symptoms. Pertinent  negatives for hypoglycemia include no dizziness. Pertinent negatives for diabetes include no blurred vision, no fatigue, no polydipsia, no polyphagia and no polyuria. There are no hypoglycemic complications. Symptoms are improving. There are no diabetic complications. Risk factors for coronary artery disease include sedentary lifestyle and obesity. Current diabetic treatment includes oral agent (dual therapy) (20 units daily). She is compliant with treatment most of the time. She is following a generally healthy diet. When asked about meal planning, she reported none. She has not had a previous visit with a dietitian. She rarely participates in exercise. (Averaging 130's) An ACE inhibitor/angiotensin II receptor blocker is not being taken. Eye exam is not current (she is having eye treatment with Dr. Zigmund [name] for cataract, Dr.Groat is her opthalmologist).     Past Medical History:  Diagnosis Date  . Back pain   . Diabetes mellitus without complication ([place])   . Hypertension   . Olecranon bursitis of left elbow    Past Surgical History:  Procedure Laterality Date  . ABDOMINAL HYSTERECTOMY    . OLECRANON BURSECTOMY Left 06/19/2017   Procedure: Excision olecranon bursa;  Surgeon: Dorna Leitz, MD;  Location: [facility];  Service: Orthopedics;  Laterality: Left;  Panel Length: 60 mins   . TIBIA IM NAIL INSERTION Right 06/10/2016   Procedure: INTRAMEDULLARY (IM) NAIL TIBIAL [place];  Surgeon: Dorna Leitz, MD;  Location: [facility];  Service: Orthopedics;  Laterality: Right;        Allergies:   Patient has no known allergies.   Social History   Tobacco Use  . Smoking  status: Current Every Day Smoker    Packs/day: 1.00    Types: Cigarettes  . Smokeless tobacco: Never Used  . Tobacco comment: she is "trying to quit" - she has cut down to 5-6   Substance Use Topics  . Alcohol use: No    Comment: opccasionally   . Drug use: No     Family Hx: The patient's family history includes  Diabetes in her father and mother; Hypertension in her father and mother.  ROS:   Please see the history of present illness.    Review of Systems  Constitutional: Negative.  Negative for fatigue.  Eyes: Negative for blurred vision.  Respiratory: Negative.  Negative for cough.   Cardiovascular: Negative.  Negative for palpitations.  Neurological: Negative for dizziness and tingling.  Endo/Heme/Allergies: Negative for polydipsia and polyphagia.  Psychiatric/Behavioral: Negative.     All other systems reviewed and are negative.   Labs/Other Tests and Data Reviewed:    Recent Labs: 07/28/2019: Hemoglobin 11.5; Platelets 300 11/18/2019: ALT 13; BUN 30; Creatinine, Ser 0.97; Potassium 3.9; Sodium 144   Recent Lipid Panel Lab Results  Component Value Date/Time   CHOL 134 11/18/2019 02:44 PM   TRIG 113 11/18/2019 02:44 PM   HDL 33 (L) 11/18/2019 02:44 PM   CHOLHDL 4.1 11/18/2019 02:44 PM   LDLCALC 80 11/18/2019 02:44 PM    Wt Readings from Last 3 Encounters:  11/09/19 190 lb (86.2 kg)  07/28/19 204 lb 9.6 oz (92.8 kg)  05/06/19 200 lb 6.4 oz (90.9 kg)     Exam:    Vital Signs:  BP 120/75 (BP Location: Right Arm, Patient Position: Sitting, Cuff Size: Large)   Wt 190 lb (86.2 kg)   BMI 33.44 kg/m     Physical Exam  Constitutional: She is oriented to person, place, and time. No distress.  Pulmonary/Chest: Effort normal. No respiratory distress.  Neurological: She is alert and oriented to person, place, and time.  Psychiatric: Memory, affect and judgment normal.    ASSESSMENT & PLAN:    1. Type 2 diabetes mellitus without complication, with long-term current use of insulin (HCC)  Chronic, she feels she is doing well on Xultophy  Will check HgbA1c once she is out of isolation from covid exposure  2. Essential hypertension  Chronic, good control  Continue with current medications  Will check kidney functions once she is out of isolation  3. Vitamin D  deficiency Will check vitamin D level and supplement as needed.    Also encouraged to spend 15 minutes in the sun daily.   4. Tobacco abuse  She would like to quit smoking and is willing to start wellbutrin  Discussed side effects of dry mouth, if she has weird thoughts to call office  - buPROPion (WELLBUTRIN XL) 150 MG 24 hr tablet; Take 1 tablet (150 mg total) by mouth every morning.  Dispense: 30 tablet; Refill: 2  5. Depression, unspecified depression type  Will start with wellbutrin daily hopefully will also help with smoking cessation - buPROPion (WELLBUTRIN XL) 150 MG 24 hr tablet; Take 1 tablet (150 mg total) by mouth every morning.  Dispense: 30 tablet; Refill: 2   COVID-19 Education: The signs and symptoms of COVID-19 were discussed with the patient and how to seek care for testing (follow up with PCP or arrange E-visit).  The importance of social distancing was discussed today.  Patient Risk:   After full review of this patients clinical status, I feel that they are at least  moderate risk at this time.  Time:   Today, I have spent 12.44  minutes/ seconds with the patient with telehealth technology discussing above diagnoses.     Medication Adjustments/Labs and Tests Ordered: Current medicines are reviewed at length with the patient today.  Concerns regarding medicines are outlined above.   Tests Ordered: No orders of the defined types were placed in this encounter.   Medication Changes: Meds ordered this encounter  Medications  . buPROPion (WELLBUTRIN XL) 150 MG 24 hr tablet    Sig: Take 1 tablet (150 mg total) by mouth every morning.    Dispense:  30 tablet    Refill:  2  . Insulin Degludec-Liraglutide (XULTOPHY) 100-3.6 UNIT-MG/ML SOPN    Sig: Inject 24 Units into the skin daily.    Dispense:  15 mL    Refill:  1    Disposition:  Follow up 6 weeks medication check  Signed, Arnette Felts, FNP

## 2019-11-18 ENCOUNTER — Other Ambulatory Visit: Payer: BC Managed Care – PPO

## 2019-11-18 ENCOUNTER — Other Ambulatory Visit: Payer: Self-pay

## 2019-11-18 ENCOUNTER — Telehealth: Payer: Self-pay

## 2019-11-18 DIAGNOSIS — E119 Type 2 diabetes mellitus without complications: Secondary | ICD-10-CM

## 2019-11-19 LAB — CMP14+EGFR
ALT: 13 IU/L (ref 0–32)
AST: 14 IU/L (ref 0–40)
Albumin/Globulin Ratio: 1.6 (ref 1.2–2.2)
Albumin: 4.1 g/dL (ref 3.8–4.8)
Alkaline Phosphatase: 87 IU/L (ref 39–117)
BUN/Creatinine Ratio: 31 — ABNORMAL HIGH (ref 12–28)
BUN: 30 mg/dL — ABNORMAL HIGH (ref 8–27)
Bilirubin Total: <0.2 mg/dL (ref 0.0–1.2)
CO2: 21 mmol/L (ref 20–29)
Calcium: 9.2 mg/dL (ref 8.7–10.3)
Chloride: 110 mmol/L — ABNORMAL HIGH (ref 96–106)
Creatinine, Ser: 0.97 mg/dL (ref 0.57–1.00)
GFR calc Af Amer: 72 mL/min/{1.73_m2} (ref >59)
GFR calc non Af Amer: 62 mL/min/{1.73_m2} (ref >59)
Globulin, Total: 2.5 g/dL (ref 1.5–4.5)
Glucose: 171 mg/dL — ABNORMAL HIGH (ref 65–99)
Potassium: 3.9 mmol/L (ref 3.5–5.2)
Sodium: 144 mmol/L (ref 134–144)
Total Protein: 6.6 g/dL (ref 6.0–8.5)

## 2019-11-19 LAB — LIPID PANEL
Chol/HDL Ratio: 4.1 ratio (ref 0.0–4.4)
Cholesterol, Total: 134 mg/dL (ref 100–199)
HDL: 33 mg/dL — ABNORMAL LOW (ref >39)
LDL Chol Calc (NIH): 80 mg/dL (ref 0–99)
Triglycerides: 113 mg/dL (ref 0–149)
VLDL Cholesterol Cal: 21 mg/dL (ref 5–40)

## 2019-11-19 LAB — HEMOGLOBIN A1C
Est. average glucose Bld gHb Est-mCnc: 160 mg/dL
Hgb A1c MFr Bld: 7.2 % — ABNORMAL HIGH (ref 4.8–5.6)

## 2019-11-19 LAB — VITAMIN D 25 HYDROXY (VIT D DEFICIENCY, FRACTURES): Vit D, 25-Hydroxy: 44.2 ng/mL (ref 30.0–100.0)

## 2019-11-22 MED ORDER — XULTOPHY 100-3.6 UNIT-MG/ML ~~LOC~~ SOPN: 24.0000 [IU] | PEN_INJECTOR | Freq: Every day | SUBCUTANEOUS | 1 refills | Status: DC

## 2019-12-03 ENCOUNTER — Other Ambulatory Visit: Payer: Self-pay

## 2019-12-03 ENCOUNTER — Encounter (INDEPENDENT_AMBULATORY_CARE_PROVIDER_SITE_OTHER): Payer: BC Managed Care – PPO | Admitting: Ophthalmology

## 2019-12-03 DIAGNOSIS — E113393 Type 2 diabetes mellitus with moderate nonproliferative diabetic retinopathy without macular edema, bilateral: Secondary | ICD-10-CM | POA: Diagnosis not present

## 2019-12-03 DIAGNOSIS — H35033 Hypertensive retinopathy, bilateral: Secondary | ICD-10-CM

## 2019-12-03 DIAGNOSIS — H34812 Central retinal vein occlusion, left eye, with macular edema: Secondary | ICD-10-CM | POA: Diagnosis not present

## 2019-12-03 DIAGNOSIS — E11311 Type 2 diabetes mellitus with unspecified diabetic retinopathy with macular edema: Secondary | ICD-10-CM | POA: Diagnosis not present

## 2019-12-03 DIAGNOSIS — I10 Essential (primary) hypertension: Secondary | ICD-10-CM

## 2019-12-03 DIAGNOSIS — H43813 Vitreous degeneration, bilateral: Secondary | ICD-10-CM

## 2019-12-11 ENCOUNTER — Other Ambulatory Visit: Payer: Self-pay | Admitting: Nurse Practitioner

## 2019-12-11 ENCOUNTER — Other Ambulatory Visit: Payer: Self-pay | Admitting: Internal Medicine

## 2020-01-04 ENCOUNTER — Encounter (INDEPENDENT_AMBULATORY_CARE_PROVIDER_SITE_OTHER): Payer: BC Managed Care – PPO | Admitting: Ophthalmology

## 2020-01-05 ENCOUNTER — Other Ambulatory Visit: Payer: Self-pay

## 2020-01-05 ENCOUNTER — Ambulatory Visit (INDEPENDENT_AMBULATORY_CARE_PROVIDER_SITE_OTHER): Payer: BC Managed Care – PPO | Admitting: Nurse Practitioner

## 2020-01-05 ENCOUNTER — Encounter: Payer: Self-pay | Admitting: Nurse Practitioner

## 2020-01-05 VITALS — BP 130/78 | HR 70 | Temp 97.7°F | Ht 63.2 in | Wt 190.0 lb

## 2020-01-05 DIAGNOSIS — Z1231 Encounter for screening mammogram for malignant neoplasm of breast: Secondary | ICD-10-CM | POA: Diagnosis not present

## 2020-01-05 DIAGNOSIS — R112 Nausea with vomiting, unspecified: Secondary | ICD-10-CM

## 2020-01-05 DIAGNOSIS — R350 Frequency of micturition: Secondary | ICD-10-CM

## 2020-01-05 DIAGNOSIS — R5383 Other fatigue: Secondary | ICD-10-CM

## 2020-01-05 LAB — POCT URINALYSIS DIPSTICK
Glucose, UA: NEGATIVE
Ketones, UA: NEGATIVE
Nitrite, UA: NEGATIVE
Protein, UA: POSITIVE — AB
Spec Grav, UA: >=1.03 — AB (ref 1.010–1.025)
Urobilinogen, UA: 0.2 E.U./dL
pH, UA: 5 (ref 5.0–8.0)

## 2020-01-05 NOTE — Patient Instructions (Signed)
Bland Diet A bland diet consists of foods that are often soft and do not have a lot of fat, fiber, or extra seasonings. Foods without fat, fiber, or seasoning are easier for the body to digest. They are also less likely to irritate your mouth, throat, stomach, and other parts of your digestive system. A bland diet is sometimes called a BRAT diet. What is my plan? Your health care provider or food and nutrition specialist (dietitian) may recommend specific changes to your diet to prevent symptoms or to treat your symptoms. These changes may include:  Eating small meals often.  Cooking food until it is soft enough to chew easily.  Chewing your food well.  Drinking fluids slowly.  Not eating foods that are very spicy, sour, or fatty.  Not eating citrus fruits, such as oranges and grapefruit. What do I need to know about this diet?  Eat a variety of foods from the bland diet food list.  Do not follow a bland diet longer than needed.  Ask your health care provider whether you should take vitamins or supplements. What foods can I eat? Grains  Hot cereals, such as cream of wheat. Rice. Bread, crackers, or tortillas made from refined white flour. Vegetables Canned or cooked vegetables. Mashed or boiled potatoes. Fruits  Bananas. Applesauce. Other types of cooked or canned fruit with the skin and seeds removed, such as canned peaches or pears. Meats and other proteins  Scrambled eggs. Creamy peanut butter or other nut butters. Lean, well-cooked meats, such as chicken or fish. Tofu. Soups or broths. Dairy Low-fat dairy products, such as milk, cottage cheese, or yogurt. Beverages  Water. Herbal tea. Apple juice. Fats and oils Mild salad dressings. Canola or olive oil. Sweets and desserts Pudding. Custard. Fruit gelatin. Ice cream. The items listed above may not be a complete list of recommended foods and beverages. Contact a dietitian for more options. What foods are not  recommended? Grains Whole grain breads and cereals. Vegetables Raw vegetables. Fruits Raw fruits, especially citrus, berries, or dried fruits. Dairy Whole fat dairy foods. Beverages Caffeinated drinks. Alcohol. Seasonings and condiments Strongly flavored seasonings or condiments. Hot sauce. Salsa. Other foods Spicy foods. Fried foods. Sour foods, such as pickled or fermented foods. Foods with high sugar content. Foods high in fiber. The items listed above may not be a complete list of foods and beverages to avoid. Contact a dietitian for more information. Summary  A bland diet consists of foods that are often soft and do not have a lot of fat, fiber, or extra seasonings.  Foods without fat, fiber, or seasoning are easier for the body to digest.  Check with your health care provider to see how long you should follow this diet plan. It is not meant to be followed for long periods. This information is not intended to replace advice given to you by your health care provider. Make sure you discuss any questions you have with your health care provider. Document Revised: 09/25/2017 Document Reviewed: 09/25/2017 Elsevier Patient Education  2020 [employer]  

## 2020-01-05 NOTE — Progress Notes (Signed)
This visit occurred during the SARS-CoV-2 public health emergency.  Safety protocols were in place, including screening questions prior to the visit, additional usage of staff PPE, and extensive cleaning of exam room while observing appropriate contact time as indicated for disinfecting solutions.  Subjective:     Patient ID: [name] , female    DOB: July 25, 1956 , 64 y.o.   MRN: [id number]   Chief Complaint  Patient presents with  . Emesis    pt was vomitting yesterday she has no appetite or energy. she is just not feeling well overall    HPI  No diarrhea in the last 24 hours. Had vomiting in the last 24 hours. Denies fever. She started with symptoms after having her 2nd covid vaccine.  Blood sugar is averaging 130.    Emesis  This is a new problem. The current episode started 1 to 4 weeks ago (2 weeks ago). The problem has been gradually worsening. Associated symptoms include diarrhea and headaches (she feels this is related to her eyes). Pertinent negatives include no abdominal pain, arthralgias, chills, dizziness or fever.     Past Medical History:  Diagnosis Date  . Back pain   . Diabetes mellitus without complication ([place])   . Hypertension   . Olecranon bursitis of left elbow      Family History  Problem Relation Age of Onset  . Diabetes Mother   . Hypertension Mother   . Diabetes Father   . Hypertension Father      Current Outpatient Medications:  .  amLODipine (NORVASC) 5 MG tablet, TAKE 1 TABLET (5 MG TOTAL) BY MOUTH DAILY., Disp: 90 tablet, Rfl: 1 .  aspirin EC 325 MG EC tablet, Take 1 tablet (325 mg total) by mouth daily. Take x 1 month post op to decrease risk of blood clots., Disp: 30 tablet, Rfl: 0 .  atorvastatin (LIPITOR) 10 MG tablet, TAKE 1 TABLET BY MOUTH EVERY DAY, Disp: 90 tablet, Rfl: 1 .  buPROPion (WELLBUTRIN XL) 150 MG 24 hr tablet, Take 1 tablet (150 mg total) by mouth every morning., Disp: 30 tablet, Rfl: 2 .  Continuous Blood Gluc  Receiver (FREESTYLE LIBRE READER) DEVI, 1 each by Does not apply route 4 (four) times daily., Disp: 1 Device, Rfl: 0 .  Continuous Blood Gluc Sensor (Everest) MISC, 1 each by Does not apply route 4 (four) times daily., Disp: 6 each, Rfl: 3 .  diclofenac sodium (VOLTAREN) 1 % GEL, Apply 2 g topically 4 (four) times daily., Disp: 100 g, Rfl: 1 .  ergocalciferol (VITAMIN D2) 1.25 MG (50000 UT) capsule, TAKE 1 CAPSULE (50,000 UNITS TOTAL) BY MOUTH EVERY 7 (SEVEN) DAYS., Disp: 12 capsule, Rfl: 0 .  Insulin Degludec-Liraglutide (XULTOPHY) 100-3.6 UNIT-MG/ML SOPN, Inject 24 Units into the skin daily., Disp: 15 mL, Rfl: 1 .  Insulin Pen Needle (BD PEN NEEDLE NANO U/F) 32G X 4 MM MISC, USE FOR 1 INJECTION DAILY, Disp: 100 each, Rfl: 1 .  valsartan (DIOVAN) 160 MG tablet, TAKE ONE TABLET BY MOUTH ONCE DAILY, Disp: 90 tablet, Rfl: 0   No Known Allergies   Review of Systems  Constitutional: Positive for fatigue. Negative for chills and fever.  Gastrointestinal: Positive for diarrhea and vomiting. Negative for abdominal pain and constipation.  Musculoskeletal: Negative for arthralgias.  Neurological: Positive for headaches (she feels this is related to her eyes). Negative for dizziness.  Psychiatric/Behavioral: Negative.      Today's Vitals   01/05/20 1039  BP: 130/78  Pulse: 70  Temp: 97.7 F (36.5 C)  TempSrc: Oral  Weight: 190 lb (86.2 kg)  Height: 5' 3.2" (1.605 m)  PainSc: 0-No pain   Body mass index is 33.44 kg/m.   Objective:  Physical Exam Constitutional:      Appearance: Normal appearance.  Cardiovascular:     Rate and Rhythm: Normal rate and regular rhythm.     Pulses: Normal pulses.     Heart sounds: Normal heart sounds. No murmur.  Pulmonary:     Effort: Pulmonary effort is normal. No respiratory distress.     Breath sounds: Normal breath sounds.  Abdominal:     General: Abdomen is flat. Bowel sounds are normal.     Palpations: Abdomen is soft.   Skin:    Capillary Refill: Capillary refill takes less than 2 seconds.  Neurological:     General: No focal deficit present.     Mental Status: She is alert and oriented to person, place, and time.  Psychiatric:        Mood and Affect: Mood normal.        Thought Content: Thought content normal.        Judgment: Judgment normal.         Assessment And Plan:     1. Fatigue, unspecified type  Over the last 2 weeks she has been fatigued  Will check her hgb levels and kidney functions - BMP8+eGFR - CBC with Differential/Platelet  2. Non-intractable vomiting with nausea, unspecified vomiting type  Encouraged to eat a bland diet, no further vomiting - BMP8+eGFR - CBC with Differential/Platelet  3. Encounter for screening mammogram for malignant neoplasm of breast  Pt instructed on Self Breast Exam.According to ACOG guidelines Women aged 64 and older are recommended to get an annual mammogram. Form completed and given to patient contact the [facility] for appointment scheduing.   Pt encouraged to get annual mammogram - MM DIGITAL SCREENING BILATERAL; Future  4. Urinary frequency  Urinalysis with protein and moderate leukocytes will send for culture - POCT Urinalysis Dipstick (66294) - Urine Culture    [name], FNP    THE PATIENT IS ENCOURAGED TO PRACTICE SOCIAL DISTANCING DUE TO THE COVID-19 PANDEMIC.

## 2020-01-06 ENCOUNTER — Observation Stay (HOSPITAL_COMMUNITY)
Admission: EM | Admit: 2020-01-06 | Discharge: 2020-01-08 | Disposition: A | Discharge status: Home / Self Care | Payer: BC Managed Care – PPO | Attending: Family Medicine | Admitting: Family Medicine

## 2020-01-06 ENCOUNTER — Encounter: Payer: Self-pay | Admitting: Nurse Practitioner

## 2020-01-06 ENCOUNTER — Telehealth: Payer: Self-pay | Admitting: Nurse Practitioner

## 2020-01-06 ENCOUNTER — Other Ambulatory Visit: Payer: Self-pay

## 2020-01-06 ENCOUNTER — Encounter (HOSPITAL_COMMUNITY): Payer: Self-pay | Admitting: Emergency Medicine

## 2020-01-06 DIAGNOSIS — F17200 Nicotine dependence, unspecified, uncomplicated: Secondary | ICD-10-CM | POA: Diagnosis present

## 2020-01-06 DIAGNOSIS — E119 Type 2 diabetes mellitus without complications: Secondary | ICD-10-CM | POA: Diagnosis not present

## 2020-01-06 DIAGNOSIS — I10 Essential (primary) hypertension: Secondary | ICD-10-CM | POA: Diagnosis present

## 2020-01-06 DIAGNOSIS — D509 Iron deficiency anemia, unspecified: Secondary | ICD-10-CM | POA: Diagnosis present

## 2020-01-06 DIAGNOSIS — Z20822 Contact with and (suspected) exposure to covid-19: Secondary | ICD-10-CM | POA: Insufficient documentation

## 2020-01-06 DIAGNOSIS — F17210 Nicotine dependence, cigarettes, uncomplicated: Secondary | ICD-10-CM | POA: Diagnosis not present

## 2020-01-06 DIAGNOSIS — N950 Postmenopausal bleeding: Secondary | ICD-10-CM

## 2020-01-06 DIAGNOSIS — Z833 Family history of diabetes mellitus: Secondary | ICD-10-CM | POA: Insufficient documentation

## 2020-01-06 DIAGNOSIS — M549 Dorsalgia, unspecified: Secondary | ICD-10-CM | POA: Diagnosis present

## 2020-01-06 DIAGNOSIS — E1151 Type 2 diabetes mellitus with diabetic peripheral angiopathy without gangrene: Secondary | ICD-10-CM

## 2020-01-06 DIAGNOSIS — Z7982 Long term (current) use of aspirin: Secondary | ICD-10-CM | POA: Insufficient documentation

## 2020-01-06 DIAGNOSIS — D649 Anemia, unspecified: Secondary | ICD-10-CM

## 2020-01-06 DIAGNOSIS — Z79899 Other long term (current) drug therapy: Secondary | ICD-10-CM | POA: Diagnosis not present

## 2020-01-06 DIAGNOSIS — E1159 Type 2 diabetes mellitus with other circulatory complications: Secondary | ICD-10-CM

## 2020-01-06 DIAGNOSIS — D529 Folate deficiency anemia, unspecified: Principal | ICD-10-CM | POA: Diagnosis present

## 2020-01-06 DIAGNOSIS — G8929 Other chronic pain: Secondary | ICD-10-CM | POA: Diagnosis present

## 2020-01-06 DIAGNOSIS — Z794 Long term (current) use of insulin: Secondary | ICD-10-CM | POA: Diagnosis not present

## 2020-01-06 HISTORY — DX: Gastro-esophageal reflux disease without esophagitis: K21.9

## 2020-01-06 LAB — COMPREHENSIVE METABOLIC PANEL
ALT: 13 U/L (ref 0–44)
AST: 14 U/L — ABNORMAL LOW (ref 15–41)
Albumin: 3.3 g/dL — ABNORMAL LOW (ref 3.5–5.0)
Alkaline Phosphatase: 77 U/L (ref 38–126)
Anion gap: 11 (ref 5–15)
BUN: 14 mg/dL (ref 8–23)
CO2: 22 mmol/L (ref 22–32)
Calcium: 8.9 mg/dL (ref 8.9–10.3)
Chloride: 107 mmol/L (ref 98–111)
Creatinine, Ser: 1.05 mg/dL — ABNORMAL HIGH (ref 0.44–1.00)
GFR calc Af Amer: >60 mL/min (ref >60)
GFR calc non Af Amer: 56 mL/min — ABNORMAL LOW (ref >60)
Glucose, Bld: 105 mg/dL — ABNORMAL HIGH (ref 70–99)
Potassium: 4.1 mmol/L (ref 3.5–5.1)
Sodium: 140 mmol/L (ref 135–145)
Total Bilirubin: 0.5 mg/dL (ref 0.3–1.2)
Total Protein: 6.3 g/dL — ABNORMAL LOW (ref 6.5–8.1)

## 2020-01-06 LAB — CBC WITH DIFFERENTIAL/PLATELET
Basophils Absolute: 0.1 10*3/uL (ref 0.0–0.2)
Basos: 1 %
EOS (ABSOLUTE): 0.2 10*3/uL (ref 0.0–0.4)
Eos: 2 %
Hematocrit: 25.2 % — ABNORMAL LOW (ref 34.0–46.6)
Hemoglobin: 6.9 g/dL — CL (ref 11.1–15.9)
Immature Grans (Abs): 0.1 10*3/uL (ref 0.0–0.1)
Immature Granulocytes: 1 %
Lymphocytes Absolute: 2.1 10*3/uL (ref 0.7–3.1)
Lymphs: 20 %
MCH: 20.3 pg — ABNORMAL LOW (ref 26.6–33.0)
MCHC: 27.4 g/dL — ABNORMAL LOW (ref 31.5–35.7)
MCV: 74 fL — ABNORMAL LOW (ref 79–97)
Monocytes Absolute: 0.8 10*3/uL (ref 0.1–0.9)
Monocytes: 8 %
NRBC: 3 % — ABNORMAL HIGH (ref 0–0)
Neutrophils Absolute: 7.2 10*3/uL — ABNORMAL HIGH (ref 1.4–7.0)
Neutrophils: 68 %
Platelets: 310 10*3/uL (ref 150–450)
RBC: 3.4 x10E6/uL — ABNORMAL LOW (ref 3.77–5.28)
RDW: 18.8 % — ABNORMAL HIGH (ref 11.7–15.4)
WBC: 10.5 10*3/uL (ref 3.4–10.8)

## 2020-01-06 LAB — BMP8+EGFR
BUN/Creatinine Ratio: 14 (ref 12–28)
BUN: 14 mg/dL (ref 8–27)
CO2: 25 mmol/L (ref 20–29)
Calcium: 9 mg/dL (ref 8.7–10.3)
Chloride: 105 mmol/L (ref 96–106)
Creatinine, Ser: 1.01 mg/dL — ABNORMAL HIGH (ref 0.57–1.00)
GFR calc Af Amer: 68 mL/min/{1.73_m2} (ref >59)
GFR calc non Af Amer: 59 mL/min/{1.73_m2} — ABNORMAL LOW (ref >59)
Glucose: 81 mg/dL (ref 65–99)
Potassium: 4.1 mmol/L (ref 3.5–5.2)
Sodium: 141 mmol/L (ref 134–144)

## 2020-01-06 LAB — CBC
HCT: 25.5 % — ABNORMAL LOW (ref 36.0–46.0)
Hemoglobin: 7 g/dL — ABNORMAL LOW (ref 12.0–15.0)
MCH: 20.3 pg — ABNORMAL LOW (ref 26.0–34.0)
MCHC: 27.5 g/dL — ABNORMAL LOW (ref 30.0–36.0)
MCV: 73.9 fL — ABNORMAL LOW (ref 80.0–100.0)
Platelets: 357 10*3/uL (ref 150–400)
RBC: 3.45 MIL/uL — ABNORMAL LOW (ref 3.87–5.11)
RDW: 20.6 % — ABNORMAL HIGH (ref 11.5–15.5)
WBC: 11.3 10*3/uL — ABNORMAL HIGH (ref 4.0–10.5)
nRBC: 2.7 % — ABNORMAL HIGH (ref 0.0–0.2)

## 2020-01-06 LAB — URINE CULTURE

## 2020-01-06 NOTE — Telephone Encounter (Signed)
Called patient to advise her of her low Hgb which is down by 1/2 of what it was 5 months ago. She does report some shortness of breath over the last 2 weeks.  I have advised her to go to the ER for further evaluation due to the significance of the decline. I will call to provide a report as well.

## 2020-01-06 NOTE — ED Triage Notes (Signed)
Pt arrives to ED from home with complaints of a hemoglobin of 6.9 that was taken yesterday. Patient states she had some rectal and vaginal bleeding over a month ago but not any sense. Patient states over he last week she has been short of breath, nauseous, dizzy, and lightheaded.

## 2020-01-07 ENCOUNTER — Other Ambulatory Visit (HOSPITAL_COMMUNITY): Payer: BC Managed Care – PPO

## 2020-01-07 ENCOUNTER — Encounter (HOSPITAL_COMMUNITY): Payer: Self-pay | Admitting: Internal Medicine

## 2020-01-07 ENCOUNTER — Observation Stay (HOSPITAL_COMMUNITY): Payer: BC Managed Care – PPO

## 2020-01-07 DIAGNOSIS — D529 Folate deficiency anemia, unspecified: Secondary | ICD-10-CM | POA: Diagnosis present

## 2020-01-07 DIAGNOSIS — D649 Anemia, unspecified: Secondary | ICD-10-CM | POA: Diagnosis not present

## 2020-01-07 DIAGNOSIS — G8929 Other chronic pain: Secondary | ICD-10-CM | POA: Diagnosis present

## 2020-01-07 DIAGNOSIS — F17200 Nicotine dependence, unspecified, uncomplicated: Secondary | ICD-10-CM | POA: Diagnosis present

## 2020-01-07 HISTORY — DX: Anemia, unspecified: D64.9

## 2020-01-07 LAB — RETICULOCYTES
Immature Retic Fract: 29.5 % — ABNORMAL HIGH (ref 2.3–15.9)
RBC.: 3.28 MIL/uL — ABNORMAL LOW (ref 3.87–5.11)
Retic Count, Absolute: 114.6 10*3/uL (ref 19.0–186.0)
Retic Ct Pct: 3.5 % — ABNORMAL HIGH (ref 0.4–3.1)

## 2020-01-07 LAB — VITAMIN B12: Vitamin B-12: 401 pg/mL (ref 180–914)

## 2020-01-07 LAB — TSH: TSH: 0.451 u[IU]/mL (ref 0.350–4.500)

## 2020-01-07 LAB — GLUCOSE, CAPILLARY
Glucose-Capillary: 124 mg/dL — ABNORMAL HIGH (ref 70–99)
Glucose-Capillary: 120 mg/dL — ABNORMAL HIGH (ref 70–99)
Glucose-Capillary: 136 mg/dL — ABNORMAL HIGH (ref 70–99)

## 2020-01-07 LAB — HEMOGLOBIN AND HEMATOCRIT, BLOOD
HCT: 30.5 % — ABNORMAL LOW (ref 36.0–46.0)
Hemoglobin: 9.3 g/dL — ABNORMAL LOW (ref 12.0–15.0)

## 2020-01-07 LAB — POC OCCULT BLOOD, ED: Fecal Occult Bld: NEGATIVE

## 2020-01-07 LAB — IRON AND TIBC
Iron: 12 ug/dL — ABNORMAL LOW (ref 28–170)
Saturation Ratios: 3 % — ABNORMAL LOW (ref 10.4–31.8)
TIBC: 379 ug/dL (ref 250–450)
UIBC: 367 ug/dL

## 2020-01-07 LAB — SARS CORONAVIRUS 2 (TAT 6-24 HRS): SARS Coronavirus 2: NEGATIVE

## 2020-01-07 LAB — FERRITIN: Ferritin: 4 ng/mL — ABNORMAL LOW (ref 11–307)

## 2020-01-07 LAB — FOLATE: Folate: 5.7 ng/mL — ABNORMAL LOW (ref >5.9)

## 2020-01-07 LAB — PREPARE RBC (CROSSMATCH)

## 2020-01-07 MED ORDER — DOCUSATE SODIUM 100 MG PO CAPS
100.0000 mg | ORAL_CAPSULE | Freq: Two times a day (BID) | ORAL | Status: DC
  Administered 2020-01-07 – 2020-01-08 (×2): 100 mg via ORAL
  Filled 2020-01-07 (×3): qty 1

## 2020-01-07 MED ORDER — AMLODIPINE BESYLATE 5 MG PO TABS
5.0000 mg | ORAL_TABLET | Freq: Every day | ORAL | Status: DC
  Administered 2020-01-07 – 2020-01-08 (×2): 5 mg via ORAL
  Filled 2020-01-07 (×2): qty 1

## 2020-01-07 MED ORDER — MORPHINE SULFATE (PF) 2 MG/ML IV SOLN: 2.0000 mg | INTRAVENOUS | Status: DC | PRN

## 2020-01-07 MED ORDER — ACETAMINOPHEN 325 MG PO TABS
650.0000 mg | ORAL_TABLET | Freq: Four times a day (QID) | ORAL | Status: DC | PRN
  Administered 2020-01-07: 650 mg via ORAL
  Filled 2020-01-07: qty 2

## 2020-01-07 MED ORDER — ACETAMINOPHEN 650 MG RE SUPP: 650.0000 mg | Freq: Four times a day (QID) | RECTAL | Status: DC | PRN

## 2020-01-07 MED ORDER — NICOTINE 14 MG/24HR TD PT24
14.0000 mg | MEDICATED_PATCH | Freq: Every day | TRANSDERMAL | Status: DC
  Administered 2020-01-07 – 2020-01-08 (×2): 14 mg via TRANSDERMAL
  Filled 2020-01-07 (×2): qty 1

## 2020-01-07 MED ORDER — POLYETHYLENE GLYCOL 3350 17 G PO PACK: 17.0000 g | PACK | Freq: Every day | ORAL | Status: DC | PRN

## 2020-01-07 MED ORDER — ZOLPIDEM TARTRATE 5 MG PO TABS: 5.0000 mg | ORAL_TABLET | Freq: Every evening | ORAL | Status: DC | PRN

## 2020-01-07 MED ORDER — IRBESARTAN 150 MG PO TABS
150.0000 mg | ORAL_TABLET | Freq: Every day | ORAL | Status: DC
  Administered 2020-01-07 – 2020-01-08 (×2): 150 mg via ORAL
  Filled 2020-01-07 (×3): qty 1

## 2020-01-07 MED ORDER — ENOXAPARIN SODIUM 40 MG/0.4ML ~~LOC~~ SOLN
40.0000 mg | SUBCUTANEOUS | Status: DC
  Administered 2020-01-07 – 2020-01-08 (×2): 40 mg via SUBCUTANEOUS
  Filled 2020-01-07 (×2): qty 0.4

## 2020-01-07 MED ORDER — HYDROCODONE-ACETAMINOPHEN 5-325 MG PO TABS: 1.0000 | ORAL_TABLET | ORAL | Status: DC | PRN

## 2020-01-07 MED ORDER — ONDANSETRON HCL 4 MG PO TABS: 4.0000 mg | ORAL_TABLET | Freq: Four times a day (QID) | ORAL | Status: DC | PRN

## 2020-01-07 MED ORDER — INSULIN ASPART 100 UNIT/ML ~~LOC~~ SOLN: 0.0000 [IU] | Freq: Every day | SUBCUTANEOUS | Status: DC

## 2020-01-07 MED ORDER — INSULIN DEGLUDEC-LIRAGLUTIDE 100-3.6 UNIT-MG/ML ~~LOC~~ SOPN: 16.0000 [IU] | PEN_INJECTOR | Freq: Every day | SUBCUTANEOUS | Status: DC

## 2020-01-07 MED ORDER — SODIUM CHLORIDE 0.9 % IV SOLN
10.0000 mL/h | Freq: Once | INTRAVENOUS | Status: AC
  Administered 2020-01-07: 10 mL/h via INTRAVENOUS

## 2020-01-07 MED ORDER — ONDANSETRON HCL 4 MG/2ML IJ SOLN: 4.0000 mg | Freq: Four times a day (QID) | INTRAMUSCULAR | Status: DC | PRN

## 2020-01-07 MED ORDER — SODIUM CHLORIDE 0.9 % IV SOLN
510.0000 mg | Freq: Once | INTRAVENOUS | Status: AC
  Administered 2020-01-07: 510 mg via INTRAVENOUS
  Filled 2020-01-07: qty 17

## 2020-01-07 MED ORDER — FREESTYLE LIBRE READER DEVI: 1.0000 | Freq: Four times a day (QID) | Status: DC

## 2020-01-07 MED ORDER — HYDRALAZINE HCL 20 MG/ML IJ SOLN: 5.0000 mg | INTRAMUSCULAR | Status: DC | PRN

## 2020-01-07 MED ORDER — INSULIN ASPART 100 UNIT/ML ~~LOC~~ SOLN
0.0000 [IU] | Freq: Three times a day (TID) | SUBCUTANEOUS | Status: DC
  Administered 2020-01-07: 2 [IU] via SUBCUTANEOUS

## 2020-01-07 MED ORDER — BISACODYL 5 MG PO TBEC: 5.0000 mg | DELAYED_RELEASE_TABLET | Freq: Every day | ORAL | Status: DC | PRN

## 2020-01-07 NOTE — ED Notes (Signed)
Pt stated she didn't want to put on a gown

## 2020-01-07 NOTE — H&P (Signed)
History and Physical    [name] ZOX:[id number] DOB: [birth year] DOA: 01/06/2020  PCP: Arnette Felts, FNP Consultants:  Magnus Ivan - orthopedics Patient coming from: Home - lives with niece and her husband ("they are my caregivers"); NOK: Niece, Elyn Aquas, [phone]  Chief Complaint: symptomatic anemia  HPI: [name] is a 64 y.o. female with medical history significant of HTN; DM; and chronic back pain (no longer driving) presenting with symptomatic anemia (sent by PCP).  After her second COVID vaccine on April 2, she was feeling bad.  Before that, about 3/25, she had a small episode of rectal and vaginal bleeding with diarrhea.   She had a new sexual partner for the first time in 25 years and thought that might be related.  She is not having abdominal or pelvic pain.  After the second shot she has noticed difficulty with DOE and fatigue. She vomited Monday and none since.  Occasional bloating/fullness, occasional anorexia.  No cough.  She has been trying to lose weight.  No night sweats.  She has a h/o iron deficiency anemia and has required transfusions in the past; she had a hysterectomy for heavy menstrual bleeding in 2008 but they preserved her ovaries.    ED Course:  Carryover, per Dr. Toniann Fail:  64 year old female with history of diabetes and hypertension was feeling weak and fatigued had followed up with her primary care physician is found a hemoglobin of 6.9 was referred to the ER and hemoglobin over a year is around 7 stool for occult blood was negative. Cause for the anemia cannot clear. Since patient is symptomatic PRBC transfusion has been ordered.  Review of Systems: As per HPI; otherwise review of systems reviewed and negative.   Ambulatory Status:  Ambulates with a walker  COVID Vaccine Status:   Complete  Past Medical History:  Diagnosis Date  . Back pain   . Diabetes mellitus without complication ([facility])   . Hypertension   . Olecranon bursitis of  left elbow     Past Surgical History:  Procedure Laterality Date  . ABDOMINAL HYSTERECTOMY     spared ovaries  . OLECRANON BURSECTOMY Left 06/19/2017   Procedure: Excision olecranon bursa;  Surgeon: Jodi Geralds, MD;  Location: [facility];  Service: Orthopedics;  Laterality: Left;  Panel Length: 60 mins   . TIBIA IM NAIL INSERTION Right 06/10/2016   Procedure: INTRAMEDULLARY (IM) NAIL TIBIAL RODING;  Surgeon: Jodi Geralds, MD;  Location: [facility] OR;  Service: Orthopedics;  Laterality: Right;    Social History   Socioeconomic History  . Marital status: Divorced    Spouse name: Not on file  . Number of children: Not on file  . Years of education: Not on file  . Highest education level: Not on file  Occupational History  . Occupation: retired  Tobacco Use  . Smoking status: Current Every Day Smoker    Packs/day: 1.00    Years: 30.00    Pack years: 30.00    Types: Cigarettes  . Smokeless tobacco: Never Used  . Tobacco comment: needs a patch  Substance and Sexual Activity  . Alcohol use: No    Comment: opccasionally   . Drug use: No  . Sexual activity: Not on file  Other Topics Concern  . Not on file  Social History Narrative  . Not on file   Social Determinants of Health   Financial Resource Strain: Low Risk   . Difficulty of Paying Living Expenses: Not very hard  Food  Insecurity: No Food Insecurity  . Worried About Charity fundraiser in the Last Year: Never true  . Ran Out of Food in the Last Year: Never true  Transportation Needs: Unmet Transportation Needs  . Lack of Transportation (Medical): Yes  . Lack of Transportation (Non-Medical): Yes  Physical Activity:   . Days of Exercise per Week:   . Minutes of Exercise per Session:   Stress: No Stress Concern Present  . Feeling of Stress : Not at all  Social Connections:   . Frequency of Communication with Friends and Family:   . Frequency of Social Gatherings with Friends and Family:   . Attends  Religious Services:   . Active Member of Clubs or Organizations:   . Attends Archivist Meetings:   [name] Marital Status:   Intimate Partner Violence:   . Fear of Current or Ex-Partner:   . Emotionally Abused:   [name] Physically Abused:   . Sexually Abused:     No Known Allergies  Family History  Problem Relation Age of Onset  . Diabetes Mother   . Hypertension Mother   . Diabetes Father   . Hypertension Father     Prior to Admission medications   Medication Sig Start Date End Date Taking? Authorizing Provider  amLODipine (NORVASC) 5 MG tablet TAKE 1 TABLET (5 MG TOTAL) BY MOUTH DAILY. 12/12/19  Yes [name], FNP  aspirin EC 325 MG EC tablet Take 1 tablet (325 mg total) by mouth daily. Take x 1 month post op to decrease risk of blood clots. Patient taking differently: Take 325 mg by mouth daily.  06/10/16  Yes Gary Fleet, PA-C  Insulin Degludec-Liraglutide (XULTOPHY) 100-3.6 UNIT-MG/ML SOPN Inject 24 Units into the skin daily. Patient taking differently: Inject 16 Units into the skin daily.  11/22/19  Yes [name], FNP  valsartan (DIOVAN) 160 MG tablet TAKE ONE TABLET BY MOUTH ONCE DAILY Patient taking differently: Take 160 mg by mouth daily.  12/12/19  Yes [name], FNP  atorvastatin (LIPITOR) 10 MG tablet TAKE 1 TABLET BY MOUTH EVERY DAY Patient not taking: Reported on 01/07/2020 08/31/19   [name], FNP  buPROPion (WELLBUTRIN XL) 150 MG 24 hr tablet Take 1 tablet (150 mg total) by mouth every morning. Patient not taking: Reported on 01/07/2020 11/09/19 11/08/20  [name], FNP  Continuous Blood Gluc Receiver (FREESTYLE LIBRE READER) DEVI 1 each by Does not apply route 4 (four) times daily. 05/06/19   [name], FNP  Continuous Blood Gluc Sensor (Vander) MISC 1 each by Does not apply route 4 (four) times daily. 05/06/19   [name], FNP  diclofenac sodium (VOLTAREN) 1 % GEL Apply 2 g topically 4 (four) times daily. Patient not  taking: Reported on 01/07/2020 12/17/18   [name], FNP  ergocalciferol (VITAMIN D2) 1.25 MG (50000 UT) capsule TAKE 1 CAPSULE (50,000 UNITS TOTAL) BY MOUTH EVERY 7 (SEVEN) DAYS. Patient not taking: Reported on 01/07/2020 10/05/19   [name], FNP  Insulin Pen Needle (BD PEN NEEDLE NANO U/F) 32G X 4 MM MISC USE FOR 1 INJECTION DAILY 06/10/19   Glendale Chard, MD    Physical Exam: Vitals:   01/07/20 0715 01/07/20 0730 01/07/20 0745 01/07/20 0800  BP: (!) 170/81 (!) 143/66 (!) 156/73 (!) 155/72  Pulse: 64 61 62 64  Resp: 11 (!) 21 19 15   Temp:      TempSrc:      SpO2: 100% 98% 100% 100%  Weight:  Height:         . General:  Appears calm and comfortable and is NAD . Eyes:  PERRL, EOMI, normal lids, iris . ENT:  grossly normal hearing, lips & tongue, mmm; edentulous . Neck:  no LAD, masses or thyromegaly . Cardiovascular:  RRR, no m/r/g. No LE edema.  [name] Respiratory:   CTA bilaterally with no wheezes/rales/rhonchi.  Normal respiratory effort. . Abdomen:  soft, NT, ND, NABS . Skin:  no rash or induration seen on limited exam . Musculoskeletal:  grossly normal tone BUE/BLE, good ROM, no bony abnormality . Psychiatric:  grossly normal mood and affect, speech fluent and appropriate, AOx3 . Neurologic:  CN 2-12 grossly intact, moves all extremities in coordinated fashion    Radiological Exams on Admission: No results found.  EKG: Independently reviewed.  NSR with rate 61; nonspecific ST changes with no evidence of acute ischemia   Labs on Admission: I have personally reviewed the available labs and imaging studies at the time of the admission.  Pertinent labs:   Glucose 105 Albumin 3.3 Iron 12 Ferritin 4 WBC 11.3 Hgb 7.0 MCV 73.9 RDW 20.6 Retic 3.5% Heme negative COVID pending   Assessment/Plan Principal Problem:   Symptomatic anemia Active Problems:   Diabetes (HCC)   Essential hypertension   Chronic back pain   Tobacco dependence   Symptomatic  anemia -Patient with prior h/o anemia requiring transfusions, presenting with fatigue and DOE -PCP drew labs and Hgb was 7 and so she was sent to the ER -MCV is <80 and so this is microcytic anemia -MCV is < 70 indicating probable iron deficiency -With an MCV <70, elevated RDW, and reason for iron deficiency - iron deficiency can be presumed -With ongoing question, check ferritin;<<15, diagnostic of iron deficiency -She is heme negative and so rectal bleeding does not currently appear to be a problem; however, she has never had a colonoscopy and has been encouraged to go for outpatient testing -She also is due for mammogram and is encouraged to schedule -Given her vaginal bleeding (post-coital but still post-menopausal) in conjunction with abdominal bloating will check pelvic US (including transvaginal) -Will add TSH testing  -Will observe in med surg bed. -Transfuse 2 units PRBC to start and recheck Hgb afterwards.  -Will also give a one-time dose of Feraheme -She will need ongoing iron supplementation at home -Of note, she was started on a full-dose ASA following orthopedic surgery and has continued to take this ongoing; will stop for now as she does not have an obvious indication (could transition to 81 mg if desired)  HTN -Continue Norvasc, Diovan  DM -Will check A1c -Continue Xultophy -Cover with moderate-scale SSI -She has a continuous glucose monitor and can use this for accuchecks  Tobacco dependence -Encourage cessation.   -This was discussed with the patient and should be reviewed on an ongoing basis.   -Patch ordered at patient request.  Chronic back pain -She is not taking long-term medications for this issue     Note: This patient has been tested and is pending for the novel coronavirus COVID-19.   DVT prophylaxis:   Lovenox  Code Status:  Full - confirmed with patient Family Communication: None present; I left a message for her niece by telephone at the time of  admission. Disposition Plan:  The patient is from: home  Anticipated d/c is to: home without [facility] services  Anticipated d/c date will depend on clinical response to treatment, but possibly as early as tomorrow if she  has excellent response to treatment  Patient is currently: medically stable Consults called: None  Admission status:  It is my clinical opinion that referral for OBSERVATION is reasonable and necessary in this patient based on the above information provided. The aforementioned taken together are felt to place the patient at high risk for further clinical deterioration. However it is anticipated that the patient may be medically stable for discharge from the hospital within 24 to 48 hours.    Jonah Blue MD [facility]   How to contact the [facility] Attending or Consulting provider 7A - 7P or covering provider during after hours 7P -7A, for this patient?  1. Check the care team in [facility] and look for a) attending/consulting TR[facility] provider listed and b) the [facility] team listed 2. Log into [email or web address] and use [place]'s universal password to access. If you do not have the password, please contact the hospital operator. 3. Locate the [facility] provider you are looking for under [facility] and page to a number that you can be directly reached. 4. If you still have difficulty reaching the provider, please page the [facility] (Director on Call) for the Hospitalists listed on amion for assistance.   01/07/2020, 8:35 AM

## 2020-01-07 NOTE — Progress Notes (Signed)
Pt returned to room from ultrasound.

## 2020-01-07 NOTE — ED Provider Notes (Signed)
[facility] EMERGENCY DEPARTMENT Provider Note   CSN: [id number] Arrival date & time: 01/06/20  1504     History Chief Complaint  Patient presents with  . Abnormal Lab    [name] is a 64 y.o. female.  The history is provided by the patient.  Weakness Severity:  Moderate Onset quality:  Gradual Timing:  Intermittent Progression:  Worsening Chronicity:  New Relieved by:  Rest Worsened by:  Activity Associated symptoms: shortness of breath   Associated symptoms: no abdominal pain, no fever and no vomiting   Patient with history of diabetes and hypertension presents with fatigue and shortness of breath.  Patient reports she has been feeling this way for the past several weeks.  She reports fatigue and dyspnea on exertion.  No chest pain.     She reports a month ago she did have black and bloody stools that improved.  She also reports one episode of postcoital vaginal bleeding.  Past Medical History:  Diagnosis Date  . Back pain   . Diabetes mellitus without complication (HCC)   . Hypertension   . Olecranon bursitis of left elbow     Patient Active Problem List   Diagnosis Date Noted  . Hypertensive retinopathy of both eyes 02/18/2019  . Combined form of age-related cataract, both eyes 02/18/2019  . Closed displaced oblique fracture of shaft of left humerus 12/17/2018  . Insomnia 06/08/2018  . Essential hypertension 06/08/2018  . Olecranon bursitis, left elbow 06/19/2017  . Closed fracture of distal end of right fibula and tibia 06/10/2016  . Diabetes (HCC) 06/10/2016  . Traumatic closed displaced fracture of shaft of right tibia and fibula 06/10/2016    Past Surgical History:  Procedure Laterality Date  . ABDOMINAL HYSTERECTOMY    . OLECRANON BURSECTOMY Left 06/19/2017   Procedure: Excision olecranon bursa;  Surgeon: Jodi Geralds, MD;  Location: [facility];  Service: Orthopedics;  Laterality: Left;  Panel Length: 60 mins    . TIBIA IM NAIL INSERTION Right 06/10/2016   Procedure: INTRAMEDULLARY (IM) NAIL TIBIAL RODING;  Surgeon: Jodi Geralds, MD;  Location: [facility];  Service: Orthopedics;  Laterality: Right;     OB History   No obstetric history on file.     Family History  Problem Relation Age of Onset  . Diabetes Mother   . Hypertension Mother   . Diabetes Father   . Hypertension Father     Social History   Tobacco Use  . Smoking status: Current Every Day Smoker    Packs/day: 1.00    Types: Cigarettes  . Smokeless tobacco: Never Used  . Tobacco comment: she is "trying to quit" - she has cut down to 5-6   Substance Use Topics  . Alcohol use: No    Comment: opccasionally   . Drug use: No    Home Medications Prior to Admission medications   Medication Sig Start Date End Date Taking? Authorizing Provider  amLODipine (NORVASC) 5 MG tablet TAKE 1 TABLET (5 MG TOTAL) BY MOUTH DAILY. 12/12/19   Arnette Felts, FNP  aspirin EC 325 MG EC tablet Take 1 tablet (325 mg total) by mouth daily. Take x 1 month post op to decrease risk of blood clots. 06/10/16   Marshia Ly, PA-C  atorvastatin (LIPITOR) 10 MG tablet TAKE 1 TABLET BY MOUTH EVERY DAY 08/31/19   Arnette Felts, FNP  buPROPion (WELLBUTRIN XL) 150 MG 24 hr tablet Take 1 tablet (150 mg total) by mouth every morning. 11/09/19  11/08/20  [name], FNP  Continuous Blood Gluc Receiver (FREESTYLE LIBRE READER) DEVI 1 each by Does not apply route 4 (four) times daily. 05/06/19   [name], FNP  Continuous Blood Gluc Sensor ([employer]) MISC 1 each by Does not apply route 4 (four) times daily. 05/06/19   [name], FNP  diclofenac sodium (VOLTAREN) 1 % GEL Apply 2 g topically 4 (four) times daily. 12/17/18   [name], FNP  ergocalciferol (VITAMIN D2) 1.25 MG (50000 UT) capsule TAKE 1 CAPSULE (50,000 UNITS TOTAL) BY MOUTH EVERY 7 (SEVEN) DAYS. 10/05/19   [name], FNP  Insulin Degludec-Liraglutide (XULTOPHY) 100-3.6 UNIT-MG/ML  SOPN Inject 24 Units into the skin daily. 11/22/19   [name], FNP  Insulin Pen Needle (BD PEN NEEDLE NANO U/F) 32G X 4 MM MISC USE FOR 1 INJECTION DAILY 06/10/19   Glendale Chard, MD  valsartan (DIOVAN) 160 MG tablet TAKE ONE TABLET BY MOUTH ONCE DAILY 12/12/19   [name], FNP    Allergies    Patient has no known allergies.  Review of Systems   Review of Systems  Constitutional: Positive for fatigue. Negative for fever.  Respiratory: Positive for shortness of breath.        Dyspnea on exertion  Gastrointestinal: Negative for abdominal pain and vomiting.       Black and bloody stools a month ago  Genitourinary: Positive for vaginal bleeding.       Postcoital vaginal bleeding a month ago  Neurological: Positive for weakness.  All other systems reviewed and are negative.   Physical Exam Updated Vital Signs BP (!) 149/65   Pulse 63   Temp 98.4 F (36.9 C) (Oral)   Resp 18   Ht 1.702 m (5\' 7" )   Wt 86.2 kg   SpO2 100%   BMI 29.76 kg/m   Physical Exam CONSTITUTIONAL: Well developed/well nourished HEAD: Normocephalic/atraumatic EYES: EOMI/PERRL ENMT: Mucous membranes moist NECK: supple no meningeal signs SPINE/BACK:entire spine nontender CV: S1/S2 noted, no murmurs/rubs/gallops noted LUNGS: Lungs are clear to auscultation bilaterally, no apparent distress ABDOMEN: soft, nontender, no rebound or guarding, bowel sounds noted throughout abdomen Rectal-no blood or melena, no rectal mass.  Female nurse chaperone present for exam GU:no cva tenderness NEURO: Pt is awake/alert/appropriate, moves all extremitiesx4.  No facial droop.  EXTREMITIES: pulses normal/equal, full ROM SKIN: warm, color normal PSYCH: no abnormalities of mood noted, alert and oriented to situation  ED Results / Procedures / Treatments   Labs (all labs ordered are listed, but only abnormal results are displayed) Labs Reviewed  COMPREHENSIVE METABOLIC PANEL - Abnormal; Notable for the following  components:      Result Value   Glucose, Bld 105 (*)    Creatinine, Ser 1.05 (*)    Total Protein 6.3 (*)    Albumin 3.3 (*)    AST 14 (*)    GFR calc non Af Amer 56 (*)    All other components within normal limits  CBC - Abnormal; Notable for the following components:   WBC 11.3 (*)    RBC 3.45 (*)    Hemoglobin 7.0 (*)    HCT 25.5 (*)    MCV 73.9 (*)    MCH 20.3 (*)    MCHC 27.5 (*)    RDW 20.6 (*)    nRBC 2.7 (*)    All other components within normal limits  FOLATE - Abnormal; Notable for the following components:   Folate 5.7 (*)    All other  components within normal limits  IRON AND TIBC - Abnormal; Notable for the following components:   Iron 12 (*)    Saturation Ratios 3 (*)    All other components within normal limits  FERRITIN - Abnormal; Notable for the following components:   Ferritin 4 (*)    All other components within normal limits  RETICULOCYTES - Abnormal; Notable for the following components:   Retic Ct Pct 3.5 (*)    RBC. 3.28 (*)    Immature Retic Fract 29.5 (*)    All other components within normal limits  SARS CORONAVIRUS 2 (TAT 6-24 HRS)  VITAMIN B12  POC OCCULT BLOOD, ED  POC OCCULT BLOOD, ED  TYPE AND SCREEN  PREPARE RBC (CROSSMATCH)    EKG EKG Interpretation  Date/Time:  Wednesday January 06 2020 15:33:08 EDT Ventricular Rate:  61 PR Interval:  124 QRS Duration: 78 QT Interval:  408 QTC Calculation: 410 R Axis:   20 Text Interpretation: Normal sinus rhythm T wave abnormality, consider inferolateral ischemia Abnormal ECG Interpretation limited secondary to artifact Confirmed by Zadie Rhine ([id number]) on 01/07/2020 5:20:03 AM   Radiology No results found.  Procedures Procedures  Medications Ordered in ED Medications  0.9 %  sodium chloride infusion (has no administration in time range)    ED Course  I have reviewed the triage vital signs and the nursing notes.  Pertinent labs & imaging results that were available during my  care of the patient were reviewed by me and considered in my medical decision making (see chart for details).    MDM Rules/Calculators/A&P                       This patient presents to the ED for concern of fatigue/weakness, this involves an extensive number of treatment options, and is a complaint that carries with it a high risk of complications and morbidity.  The differential diagnosis includes dehydration, renal failure, acute coronary syndrome, anemia   Lab Tests:   I Ordered, reviewed, and interpreted labs, which included complete blood count, metabolic panel, Hemoccult  Medicines ordered:   I ordered medication blood transfusion   Additional history obtained:    Previous records obtained and reviewed PCP notes reviewed  Consultations Obtained:   I consulted [facility] hospitalist Dr. Toniann Fail and discussed lab findings  Reevaluation:  After the interventions stated above, I reevaluated the patient and found stable  Critical Interventions:  . Blood transfusion   Patient reports recent fatigue and dyspnea on exertion.  She reports any activity in her house causes shortness of breath.  Patient was found to have acute anemia.  Patient will benefit from admission and blood transfusion.  2 units of blood have been ordered for patient Final Clinical Impression(s) / ED Diagnoses Final diagnoses:  Symptomatic anemia    Rx / DC Orders ED Discharge Orders    None       Zadie Rhine, MD 01/07/20 [id number]

## 2020-01-07 NOTE — Progress Notes (Signed)
Pt transported to ultrasound at this time 

## 2020-01-08 DIAGNOSIS — D528 Other folate deficiency anemias: Secondary | ICD-10-CM

## 2020-01-08 DIAGNOSIS — D509 Iron deficiency anemia, unspecified: Secondary | ICD-10-CM | POA: Diagnosis present

## 2020-01-08 LAB — CBC
HCT: 29.4 % — ABNORMAL LOW (ref 36.0–46.0)
Hemoglobin: 8.9 g/dL — ABNORMAL LOW (ref 12.0–15.0)
MCH: 22.8 pg — ABNORMAL LOW (ref 26.0–34.0)
MCHC: 30.3 g/dL (ref 30.0–36.0)
MCV: 75.2 fL — ABNORMAL LOW (ref 80.0–100.0)
Platelets: 269 10*3/uL (ref 150–400)
RBC: 3.91 MIL/uL (ref 3.87–5.11)
RDW: 20.8 % — ABNORMAL HIGH (ref 11.5–15.5)
WBC: 12.5 10*3/uL — ABNORMAL HIGH (ref 4.0–10.5)
nRBC: 2.1 % — ABNORMAL HIGH (ref 0.0–0.2)

## 2020-01-08 LAB — BPAM RBC
Blood Product Expiration Date: 202105132359
Blood Product Expiration Date: 202105172359
ISSUE DATE / TIME: 202104290603
ISSUE DATE / TIME: 202104291024
Unit Type and Rh: 6200
Unit Type and Rh: 6200

## 2020-01-08 LAB — TYPE AND SCREEN
ABO/RH(D): A POS
Antibody Screen: NEGATIVE
Unit division: 0
Unit division: 0

## 2020-01-08 LAB — BASIC METABOLIC PANEL
Anion gap: 6 (ref 5–15)
BUN: 16 mg/dL (ref 8–23)
CO2: 25 mmol/L (ref 22–32)
Calcium: 8.7 mg/dL — ABNORMAL LOW (ref 8.9–10.3)
Chloride: 108 mmol/L (ref 98–111)
Creatinine, Ser: 0.83 mg/dL (ref 0.44–1.00)
GFR calc Af Amer: >60 mL/min (ref >60)
GFR calc non Af Amer: >60 mL/min (ref >60)
Glucose, Bld: 130 mg/dL — ABNORMAL HIGH (ref 70–99)
Potassium: 4.2 mmol/L (ref 3.5–5.1)
Sodium: 139 mmol/L (ref 135–145)

## 2020-01-08 LAB — GLUCOSE, CAPILLARY: Glucose-Capillary: 102 mg/dL — ABNORMAL HIGH (ref 70–99)

## 2020-01-08 LAB — HIV ANTIBODY (ROUTINE TESTING W REFLEX): HIV Screen 4th Generation wRfx: REACTIVE — AB

## 2020-01-08 MED ORDER — FERROUS SULFATE 325 (65 FE) MG PO TABS: 325.0000 mg | ORAL_TABLET | Freq: Two times a day (BID) | ORAL | 1 refills | Status: DC

## 2020-01-08 MED ORDER — FOLIC ACID 1 MG PO TABS: 1.0000 mg | ORAL_TABLET | Freq: Every day | ORAL | 2 refills | Status: AC

## 2020-01-08 NOTE — Discharge Instructions (Signed)

## 2020-01-08 NOTE — Discharge Summary (Signed)
Physician Discharge Summary  BULAH LURIE VVO:[id number] DOB: September 29, 1955 DOA: 01/06/2020  PCP: Arnette Felts, FNP  Admit date: 01/06/2020 Discharge date: 01/08/2020  Admitted From: Home Disposition: Home  Recommendations for Outpatient Follow-up:  1. Follow up with PCP in 1-2 weeks 2. Please obtain BMP/CBC in one week 3. Please follow up on the following pending results:  Home Health: None Equipment/Devices: None  Discharge Condition: Stable CODE STATUS: Full code Diet recommendation: Cardiac  Subjective: Seen and examined.  Feels much better.  No complaints.  Wants to go home.  HPI: [name] is a 64 y.o. female with medical history significant of HTN; DM; and chronic back pain (no longer driving) presenting with symptomatic anemia (sent by PCP).  After her second COVID vaccine on April 2, she was feeling bad.  Before that, about 3/25, she had a small episode of rectal and vaginal bleeding with diarrhea.   She had a new sexual partner for the first time in 25 years and thought that might be related.  She is not having abdominal or pelvic pain.  After the second shot she has noticed difficulty with DOE and fatigue. She vomited Monday and none since.  Occasional bloating/fullness, occasional anorexia.  No cough.  She has been trying to lose weight.  No night sweats.  She has a h/o iron deficiency anemia and has required transfusions in the past; she had a hysterectomy for heavy menstrual bleeding in 2008 but they preserved her ovaries.  Brief/Interim Summary: Patient was admitted to hospital service due to symptomatic anemia.  She received 2 units of PRBC transfusion.  Her hemoglobin upon admission was 6.9.  Posttransfusion, her hemoglobin is 8.9.  Patient is feeling much better.  Further anemia work-up revealed negative FOBT but positive iron deficiency as well as iron deficiency anemia.  Patient is feeling better so she is being discharged in stable condition.  Feosol as well as  folic acid is being prescribed to her.  Follow with PCP.  She has not had colonoscopy ever.  This is strongly encouraged to have this done as an outpatient.  She understands that.  Discharge Diagnoses:  Principal Problem:   Folate deficiency anemia Active Problems:   Diabetes (HCC)   Essential hypertension   Chronic back pain   Tobacco dependence   Iron deficiency anemia    Discharge Instructions   Allergies as of 01/08/2020   No Known Allergies     Medication List    TAKE these medications   amLODipine 5 MG tablet Commonly known as: NORVASC TAKE 1 TABLET (5 MG TOTAL) BY MOUTH DAILY.   aspirin 325 MG EC tablet Take 1 tablet (325 mg total) by mouth daily. Take x 1 month post op to decrease risk of blood clots. What changed: additional instructions   BD Pen Needle Nano U/F 32G X 4 MM Misc Generic drug: Insulin Pen Needle USE FOR 1 INJECTION DAILY   ferrous sulfate 325 (65 FE) MG tablet Take 1 tablet (325 mg total) by mouth 2 (two) times daily with a meal.   folic acid 1 MG tablet Commonly known as: FOLVITE Take 1 tablet (1 mg total) by mouth daily.   FreeStyle [employer] 1 each by Does not apply route 4 (four) times daily.   FreeStyle [employer] Misc 1 each by Does not apply route 4 (four) times daily.   valsartan 160 MG tablet Commonly known as: DIOVAN TAKE ONE TABLET BY MOUTH ONCE DAILY   Xultophy 100-3.6  UNIT-MG/ML Sopn Generic drug: Insulin Degludec-Liraglutide Inject 24 Units into the skin daily. What changed: how much to take      Follow-up Information    [name], FNP Follow up in 1 week(s).   Specialty: General Practice Contact information: [address] [place] [address] [phone]          No Known Allergies  Consultations: None   Procedures/Studies: US PELVIS (TRANSABDOMINAL ONLY)  Result Date: 01/07/2020 CLINICAL DATA:  Postmenopausal vaginal bleeding.  Hysterectomy. EXAM: TRANSABDOMINAL  ULTRASOUND OF PELVIS TECHNIQUE: Transabdominal ultrasound examination of the pelvis was performed including evaluation of the uterus, ovaries, adnexal regions, and pelvic cul-de-sac. COMPARISON:  None. FINDINGS: Uterus Surgically absent. Endometrium Surgically absent. Right ovary Not visualized.  No evidence of adnexal mass. Left ovary Not visualized.  No evidence of adnexal mass. Other findings:  No abnormal free fluid. IMPRESSION: 1. Post hysterectomy. 2. Neither ovary is visualized.  No adnexal mass. Electronically Signed   By: Keith Rake M.D.   On: 01/07/2020 23:14      Discharge Exam: Vitals:   01/07/20 2349 01/08/20 0616  BP: (!) 148/63 133/68  Pulse: 61 (!) 54  Resp: 16 16  Temp: 98.7 F (37.1 C) 98.1 F (36.7 C)  SpO2: 99% 96%   Vitals:   01/07/20 1400 01/07/20 1853 01/07/20 2349 01/08/20 0616  BP: 127/67 (!) 133/58 (!) 148/63 133/68  Pulse: 70 67 61 (!) 54  Resp: 18 18 16 16   Temp: 98.3 F (36.8 C) (!) 97.5 F (36.4 C) 98.7 F (37.1 C) 98.1 F (36.7 C)  TempSrc: Oral Oral Oral Oral  SpO2: 100% 96% 99% 96%  Weight:      Height:        General: Pt is alert, awake, not in acute distress Cardiovascular: RRR, S1/S2 +, no rubs, no gallops Respiratory: CTA bilaterally, no wheezing, no rhonchi Abdominal: Soft, NT, ND, bowel sounds + Extremities: no edema, no cyanosis    The results of significant diagnostics from this hospitalization (including imaging, microbiology, ancillary and laboratory) are listed below for reference.     Microbiology: Recent Results (from the past 240 hour(s))  Urine Culture     Status: None   Collection Time: 01/05/20  5:35 PM   Specimen: Urine   UR  Result Value Ref Range Status   Urine Culture, Routine Final report  Final   Organism ID, Bacteria Comment  Final    Comment: Mixed urogenital flora 25,000-50,000 colony forming units per mL   SARS CORONAVIRUS 2 (TAT 6-24 HRS) Nasopharyngeal Nasopharyngeal Swab     Status: None    Collection Time: 01/07/20  5:32 AM   Specimen: Nasopharyngeal Swab  Result Value Ref Range Status   SARS Coronavirus 2 NEGATIVE NEGATIVE Final    Comment: (NOTE) SARS-CoV-2 target nucleic acids are NOT DETECTED. The SARS-CoV-2 RNA is generally detectable in upper and lower respiratory specimens during the acute phase of infection. Negative results do not preclude SARS-CoV-2 infection, do not rule out co-infections with other pathogens, and should not be used as the sole basis for treatment or other patient management decisions. Negative results must be combined with clinical observations, patient history, and epidemiological information. The expected result is Negative. Fact Sheet for Patients: SugarRoll.be Fact Sheet for Healthcare Providers: https://www.woods-mathews.com/ This test is not yet approved or cleared by the [place] FDA and  has been authorized for detection and/or diagnosis of SARS-CoV-2 by FDA under an Emergency Use Authorization (EUA). This EUA will remain  in effect (meaning this test can be used) for the duration of the COVID-19 declaration under Section 56 4(b)(1) of the Act, 21 U.S.C. section 360bbb-3(b)(1), unless the authorization is terminated or revoked sooner. Performed at [facility], [address] [place], [place] [address]      Labs: BNP (last 3 results) No results for input(s): BNP in the last 8760 hours. Basic Metabolic Panel: Recent Labs  Lab 01/05/20 1136 01/06/20 1549 01/08/20 0244  NA 141 140 139  K 4.1 4.1 4.2  CL 105 107 108  CO2 25 22 25   GLUCOSE 81 105* 130*  BUN 14 14 16   CREATININE 1.01* 1.05* 0.83  CALCIUM 9.0 8.9 8.7*   Liver Function Tests: Recent Labs  Lab 01/06/20 1549  AST 14*  ALT 13  ALKPHOS 77  BILITOT 0.5  PROT 6.3*  ALBUMIN 3.3*   No results for input(s): LIPASE, AMYLASE in the last 168 hours. No results for input(s): AMMONIA in the last 168  hours. CBC: Recent Labs  Lab 01/05/20 1136 01/06/20 1549 01/07/20 1608 01/08/20 0244  WBC 10.5 11.3*  --  12.5*  NEUTROABS 7.2*  --   --   --   HGB 6.9* 7.0* 9.3* 8.9*  HCT 25.2* 25.5* 30.5* 29.4*  MCV 74* 73.9*  --  75.2*  PLT 310 357  --  269   Cardiac Enzymes: No results for input(s): CKTOTAL, CKMB, CKMBINDEX, TROPONINI in the last 168 hours. BNP: Invalid input(s): POCBNP CBG: Recent Labs  Lab 01/07/20 1112 01/07/20 1651 01/07/20 2115 01/08/20 0614  GLUCAP 124* 120* 136* 102*   D-Dimer No results for input(s): DDIMER in the last 72 hours. Hgb A1c No results for input(s): HGBA1C in the last 72 hours. Lipid Profile No results for input(s): CHOL, HDL, LDLCALC, TRIG, CHOLHDL, LDLDIRECT in the last 72 hours. Thyroid function studies Recent Labs    01/07/20 1100  TSH 0.451   Anemia work up Recent Labs    01/07/20 0026  VITAMINB12 401  FOLATE 5.7*  FERRITIN 4*  TIBC 379  IRON 12*  RETICCTPCT 3.5*   Urinalysis    Component Value Date/Time   LABSPEC 1.020 04/06/2008 1025   PHURINE 5.0 04/06/2008 1025   GLUCOSEU >=1000 (A) 04/06/2008 1025   HGBUR TRACE (A) 04/06/2008 1025   BILIRUBINUR large 01/05/2020 1715   KETONESUR >=160 (A) 04/06/2008 1025   PROTEINUR Positive (A) 01/05/2020 1715   PROTEINUR 30 (A) 04/06/2008 1025   UROBILINOGEN 0.2 01/05/2020 1715   UROBILINOGEN 0.2 04/06/2008 1025   NITRITE negative 01/05/2020 1715   NITRITE NEGATIVE 04/06/2008 1025   LEUKOCYTESUR Moderate (2+) (A) 01/05/2020 1715   Sepsis Labs Invalid input(s): PROCALCITONIN,  WBC,  LACTICIDVEN Microbiology Recent Results (from the past 240 hour(s))  Urine Culture     Status: None   Collection Time: 01/05/20  5:35 PM   Specimen: Urine   UR  Result Value Ref Range Status   Urine Culture, Routine Final report  Final   Organism ID, Bacteria Comment  Final    Comment: Mixed urogenital flora 25,000-50,000 colony forming units per mL   SARS CORONAVIRUS 2 (TAT 6-24 HRS)  Nasopharyngeal Nasopharyngeal Swab     Status: None   Collection Time: 01/07/20  5:32 AM   Specimen: Nasopharyngeal Swab  Result Value Ref Range Status   SARS Coronavirus 2 NEGATIVE NEGATIVE Final    Comment: (NOTE) SARS-CoV-2 target nucleic acids are NOT DETECTED. The SARS-CoV-2 RNA is generally detectable in upper and lower respiratory  specimens during the acute phase of infection. Negative results do not preclude SARS-CoV-2 infection, do not rule out co-infections with other pathogens, and should not be used as the sole basis for treatment or other patient management decisions. Negative results must be combined with clinical observations, patient history, and epidemiological information. The expected result is Negative. Fact Sheet for Patients: HairSlick.no Fact Sheet for Healthcare Providers: quierodirigir.com This test is not yet approved or cleared by the [place] FDA and  has been authorized for detection and/or diagnosis of SARS-CoV-2 by FDA under an Emergency Use Authorization (EUA). This EUA will remain  in effect (meaning this test can be used) for the duration of the COVID-19 declaration under Section 56 4(b)(1) of the Act, 21 U.S.C. section 360bbb-3(b)(1), unless the authorization is terminated or revoked sooner. Performed at [facility], [address] [place], [place] [address]      Time coordinating discharge: Over 30 minutes  SIGNED:   Hughie Closs, MD  [facility] 01/08/2020, 9:12 AM  If 7PM-7AM, please contact night-coverage [email or web address]

## 2020-01-12 ENCOUNTER — Other Ambulatory Visit: Payer: Self-pay

## 2020-01-12 ENCOUNTER — Ambulatory Visit (INDEPENDENT_AMBULATORY_CARE_PROVIDER_SITE_OTHER): Payer: BC Managed Care – PPO | Admitting: Nurse Practitioner

## 2020-01-12 ENCOUNTER — Other Ambulatory Visit (HOSPITAL_COMMUNITY)
Admission: RE | Admit: 2020-01-12 | Discharge: 2020-01-12 | Disposition: A | Discharge status: Home / Self Care | Payer: BC Managed Care – PPO | Source: Ambulatory Visit | Attending: Nurse Practitioner | Admitting: Nurse Practitioner

## 2020-01-12 VITALS — BP 140/80 | HR 67 | Temp 98.6°F | Wt 193.2 lb

## 2020-01-12 DIAGNOSIS — Z113 Encounter for screening for infections with a predominantly sexual mode of transmission: Secondary | ICD-10-CM

## 2020-01-12 DIAGNOSIS — A5900 Urogenital trichomoniasis, unspecified: Secondary | ICD-10-CM | POA: Diagnosis not present

## 2020-01-12 DIAGNOSIS — D509 Iron deficiency anemia, unspecified: Secondary | ICD-10-CM | POA: Diagnosis not present

## 2020-01-12 DIAGNOSIS — D528 Other folate deficiency anemias: Secondary | ICD-10-CM

## 2020-01-12 DIAGNOSIS — E119 Type 2 diabetes mellitus without complications: Secondary | ICD-10-CM

## 2020-01-12 DIAGNOSIS — B9689 Other specified bacterial agents as the cause of diseases classified elsewhere: Secondary | ICD-10-CM | POA: Insufficient documentation

## 2020-01-12 DIAGNOSIS — B373 Candidiasis of vulva and vagina: Secondary | ICD-10-CM | POA: Diagnosis not present

## 2020-01-12 DIAGNOSIS — R5383 Other fatigue: Secondary | ICD-10-CM

## 2020-01-12 DIAGNOSIS — N760 Acute vaginitis: Secondary | ICD-10-CM | POA: Diagnosis not present

## 2020-01-12 DIAGNOSIS — Z21 Asymptomatic human immunodeficiency virus [HIV] infection status: Secondary | ICD-10-CM

## 2020-01-12 LAB — POCT URINALYSIS DIPSTICK
Blood, UA: NEGATIVE
Glucose, UA: NEGATIVE
Nitrite, UA: NEGATIVE
Protein, UA: POSITIVE — AB
Spec Grav, UA: >=1.03 — AB (ref 1.010–1.025)
Urobilinogen, UA: 1 E.U./dL
pH, UA: 5 (ref 5.0–8.0)

## 2020-01-12 LAB — POCT UA - MICROALBUMIN
Creatinine, POC: 300 mg/dL
Microalbumin Ur, POC: 150 mg/L

## 2020-01-12 NOTE — Progress Notes (Signed)
This visit occurred during the SARS-CoV-2 public health emergency.  Safety protocols were in place, including screening questions prior to the visit, additional usage of staff PPE, and extensive cleaning of exam room while observing appropriate contact time as indicated for disinfecting solutions.  Subjective:     Patient ID: [name] , female    DOB: Feb 24, 1956 , 64 y.o.   MRN: [id number]   Chief Complaint  Patient presents with  . Hospitalization Follow-up    she has been feeling a little bit better than she did.     HPI  She is here today to follow up for her hospital visit from admission from 4/28-4/30 after having a low hemoglobin of 6.9 she had 2 units of PRBCs which increased her Hgb to 8.9.  She is feeling better today.   She also has a reactive antibody test to HIV.  She does have a new partner in the last 2 months and has been having intercourse after 23 years.     Past Medical History:  Diagnosis Date  . Anemia 01/07/2020   REQUIRING TRANSFUSION  . Back pain   . Diabetes mellitus without complication ([place])   . GERD (gastroesophageal reflux disease)   . Hypertension   . Olecranon bursitis of left elbow      Family History  Problem Relation Age of Onset  . Diabetes Mother   . Hypertension Mother   . Diabetes Father   . Hypertension Father      Current Outpatient Medications:  .  amLODipine (NORVASC) 5 MG tablet, TAKE 1 TABLET (5 MG TOTAL) BY MOUTH DAILY., Disp: 90 tablet, Rfl: 1 .  aspirin EC 325 MG EC tablet, Take 1 tablet (325 mg total) by mouth daily. Take x 1 month post op to decrease risk of blood clots. (Patient taking differently: Take 325 mg by mouth daily. ), Disp: 30 tablet, Rfl: 0 .  Continuous Blood Gluc Receiver (FREESTYLE LIBRE READER) DEVI, 1 each by Does not apply route 4 (four) times daily., Disp: 1 Device, Rfl: 0 .  Continuous Blood Gluc Sensor ([place]) MISC, 1 each by Does not apply route 4 (four) times daily.,  Disp: 6 each, Rfl: 3 .  ferrous sulfate 325 (65 FE) MG tablet, Take 1 tablet (325 mg total) by mouth 2 (two) times daily with a meal., Disp: 60 tablet, Rfl: 1 .  folic acid (FOLVITE) 1 MG tablet, Take 1 tablet (1 mg total) by mouth daily., Disp: 30 tablet, Rfl: 2 .  Insulin Degludec-Liraglutide (XULTOPHY) 100-3.6 UNIT-MG/ML SOPN, Inject 24 Units into the skin daily. (Patient taking differently: Inject 16 Units into the skin daily. ), Disp: 15 mL, Rfl: 1 .  Insulin Pen Needle (BD PEN NEEDLE NANO U/F) 32G X 4 MM MISC, USE FOR 1 INJECTION DAILY, Disp: 100 each, Rfl: 1 .  valsartan (DIOVAN) 160 MG tablet, TAKE ONE TABLET BY MOUTH ONCE DAILY (Patient taking differently: Take 160 mg by mouth daily. ), Disp: 90 tablet, Rfl: 0   No Known Allergies   Review of Systems  Constitutional: Negative.   Respiratory: Negative.   Cardiovascular: Negative.   Allergic/Immunologic: Positive for immunocompromised state.  Neurological: Negative for dizziness and headaches.  Psychiatric/Behavioral: Negative.      Today's Vitals   01/12/20 1607  BP: 140/80  Pulse: 67  Temp: 98.6 F (37 C)  TempSrc: Oral  Weight: 193 lb 3.2 oz (87.6 kg)  PainSc: 6   PainLoc: Back   Body mass index  is 30.26 kg/m.   Objective:  Physical Exam Constitutional:      General: She is not in acute distress.    Appearance: Normal appearance. She is obese.  Cardiovascular:     Rate and Rhythm: Normal rate and regular rhythm.     Pulses: Normal pulses.     Heart sounds: Normal heart sounds. No murmur.  Pulmonary:     Effort: Pulmonary effort is normal. No respiratory distress.     Breath sounds: Normal breath sounds.  Skin:    General: Skin is warm and dry.     Capillary Refill: Capillary refill takes less than 2 seconds.  Neurological:     General: No focal deficit present.     Mental Status: She is alert and oriented to person, place, and time.     Cranial Nerves: No cranial nerve deficit.  Psychiatric:        Mood  and Affect: Mood normal.        Behavior: Behavior normal.        Thought Content: Thought content normal.        Judgment: Judgment normal.         Assessment And Plan:     1. Fatigue, unspecified type TCM Performed. A member of the clinical team spoke with the patient upon dischare. Discharge summary was reviewed in full detail during the visit. Meds reconciled and compared to discharge meds. Medication list is updated and reviewed with the patient.  Greater than 50% face to face time was spent in counseling an coordination of care.  All questions were answered to the satisfaction of the patient.   She was admitted to the hospital 4/28-4/30 for severe anemia with her Hgb 6.9, she was transfused 2 units PRBCs with improvement up to 8.2 at discharge She reports feeling better, no current PT - QuantiFERON-TB Gold Plus - Hepatitis B Surface Antigen - Hepatitis C antibody - CBC with Differential/Platelet - Glucose 6 phosphate dehydrogenase - CMV abs, IgG+IgM (cytomegalovirus) - T-helper cells (CD4) count (not at [facility]) - HIV 1 RNA Quant  2. Other folate deficiency anemias  She is to continue with iron supplement - QuantiFERON-TB Gold Plus - Hepatitis B Surface Antigen - CBC with Differential/Platelet - CMP14+EGFR  3. Iron deficiency anemia, unspecified iron deficiency anemia type  Hgb was down to 6.9 improved after transfusion up to 8.2 will recheck today  Continue with iron supplement daily - CBC with Differential/Platelet - CMP14+EGFR  4. Type 2 diabetes mellitus without complication, without long-term current use of insulin (HCC)  Chronic, controlled  Continue with current medications  Encouraged to limit intake of sugary foods and drinks  Encouraged to increase physical activity to 150 minutes per week as tolerated - Hemoglobin A1c - POCT Urinalysis Dipstick (81002) - POCT UA - Microalbumin  5. Screening examination for STD (sexually transmitted disease)  - T  pallidum Screening Cascade - Urine cytology ancillary only - Ambulatory referral to Infectious Disease  6. 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 I will add more labs and refer to infectious disease for evaluation  I had a long discussion with the patient informing of the results and to avoid sexual activity until she has confirmation and/or being treated.   - Ambulatory referral to Infectious Disease   [name], FNP    THE PATIENT IS ENCOURAGED TO PRACTICE SOCIAL DISTANCING DUE TO THE COVID-19 PANDEMIC.

## 2020-01-13 ENCOUNTER — Other Ambulatory Visit: Payer: Self-pay | Admitting: Nurse Practitioner

## 2020-01-13 LAB — RNA QUALITATIVE: HIV 1 RNA Qualitative: 1

## 2020-01-13 LAB — HIV-1/2 AB - DIFFERENTIATION
HIV 1 Ab: NEGATIVE
HIV 2 Ab: NEGATIVE
Note: NEGATIVE

## 2020-01-14 ENCOUNTER — Other Ambulatory Visit: Payer: Self-pay | Admitting: Nurse Practitioner

## 2020-01-14 DIAGNOSIS — R5383 Other fatigue: Secondary | ICD-10-CM

## 2020-01-14 LAB — QUANTIFERON-TB GOLD PLUS
QuantiFERON Mitogen Value: >10 IU/mL
QuantiFERON Nil Value: 0.12 IU/mL
QuantiFERON TB1 Ag Value: 0.11 IU/mL
QuantiFERON TB2 Ag Value: 0.2 IU/mL
QuantiFERON-TB Gold Plus: NEGATIVE

## 2020-01-14 LAB — T-HELPER CELLS (CD4) COUNT (NOT AT ARMC)
% CD 4 Pos. Lymph.: 52.7 % (ref 30.8–58.5)
Absolute CD 4 Helper: 1212 /uL (ref 359–1519)
Basophils Absolute: 0.1 10*3/uL (ref 0.0–0.2)
Basos: 1 %
EOS (ABSOLUTE): 0.2 10*3/uL (ref 0.0–0.4)
Eos: 1 %
Hematocrit: 33.2 % — ABNORMAL LOW (ref 34.0–46.6)
Hemoglobin: 10.2 g/dL — ABNORMAL LOW (ref 11.1–15.9)
Immature Grans (Abs): 0.1 10*3/uL (ref 0.0–0.1)
Immature Granulocytes: 1 %
Lymphocytes Absolute: 2.3 10*3/uL (ref 0.7–3.1)
Lymphs: 15 %
MCH: 23.2 pg — ABNORMAL LOW (ref 26.6–33.0)
MCHC: 30.7 g/dL — ABNORMAL LOW (ref 31.5–35.7)
MCV: 76 fL — ABNORMAL LOW (ref 79–97)
Monocytes Absolute: 1 10*3/uL — ABNORMAL HIGH (ref 0.1–0.9)
Monocytes: 7 %
Neutrophils Absolute: 12 10*3/uL — ABNORMAL HIGH (ref 1.4–7.0)
Neutrophils: 75 %
Platelets: 328 10*3/uL (ref 150–450)
RBC: 4.4 x10E6/uL (ref 3.77–5.28)
RDW: 22.3 % — ABNORMAL HIGH (ref 11.7–15.4)
WBC: 15.7 10*3/uL — ABNORMAL HIGH (ref 3.4–10.8)

## 2020-01-14 LAB — HEMOGLOBIN A1C
Est. average glucose Bld gHb Est-mCnc: 111 mg/dL
Hgb A1c MFr Bld: 5.5 % (ref 4.8–5.6)

## 2020-01-14 LAB — CMP14+EGFR
ALT: 15 IU/L (ref 0–32)
AST: 20 IU/L (ref 0–40)
Albumin/Globulin Ratio: 1.7 (ref 1.2–2.2)
Albumin: 4.5 g/dL (ref 3.8–4.8)
Alkaline Phosphatase: 93 IU/L (ref 39–117)
BUN/Creatinine Ratio: 22 (ref 12–28)
BUN: 21 mg/dL (ref 8–27)
Bilirubin Total: 0.3 mg/dL (ref 0.0–1.2)
CO2: 22 mmol/L (ref 20–29)
Calcium: 9.2 mg/dL (ref 8.7–10.3)
Chloride: 109 mmol/L — ABNORMAL HIGH (ref 96–106)
Creatinine, Ser: 0.97 mg/dL (ref 0.57–1.00)
GFR calc Af Amer: 72 mL/min/{1.73_m2} (ref >59)
GFR calc non Af Amer: 62 mL/min/{1.73_m2} (ref >59)
Globulin, Total: 2.6 g/dL (ref 1.5–4.5)
Glucose: 66 mg/dL (ref 65–99)
Potassium: 4.2 mmol/L (ref 3.5–5.2)
Sodium: 146 mmol/L — ABNORMAL HIGH (ref 134–144)
Total Protein: 7.1 g/dL (ref 6.0–8.5)

## 2020-01-14 LAB — GLUCOSE 6 PHOSPHATE DEHYDROGENASE: G-6-PD, Quant: 17.6 U/g{Hb} — ABNORMAL HIGH (ref 4.8–15.7)

## 2020-01-14 LAB — CMV ABS, IGG+IGM (CYTOMEGALOVIRUS)
CMV Ab - IgG: >10 U/mL — ABNORMAL HIGH (ref 0.00–0.59)
CMV IgM Ser EIA-aCnc: <30 AU/mL (ref 0.0–29.9)

## 2020-01-14 LAB — HEPATITIS B SURFACE ANTIGEN: Hepatitis B Surface Ag: NEGATIVE

## 2020-01-14 LAB — T PALLIDUM SCREENING CASCADE: T pallidum Antibodies (TP-PA): NONREACTIVE

## 2020-01-14 LAB — HEPATITIS C ANTIBODY: Hep C Virus Ab: <0.1 s/co ratio (ref 0.0–0.9)

## 2020-01-18 ENCOUNTER — Other Ambulatory Visit: Payer: Self-pay | Admitting: Nurse Practitioner

## 2020-01-19 ENCOUNTER — Telehealth: Payer: Self-pay

## 2020-01-19 LAB — URINE CYTOLOGY ANCILLARY ONLY
Bacterial vaginitis: POSITIVE — AB
Candida vaginitis: POSITIVE — AB

## 2020-01-19 LAB — HIV-1 RNA QUANT-NO REFLEX-BLD: HIV-1 RNA Viral Load: <20 copies/mL

## 2020-01-20 ENCOUNTER — Telehealth: Payer: Self-pay | Admitting: Nurse Practitioner

## 2020-01-20 ENCOUNTER — Other Ambulatory Visit: Payer: Self-pay | Admitting: Nurse Practitioner

## 2020-01-20 DIAGNOSIS — A599 Trichomoniasis, unspecified: Secondary | ICD-10-CM

## 2020-01-20 LAB — URINE CYTOLOGY ANCILLARY ONLY
Bacterial Vaginitis-Urine: POSITIVE — AB
Bacterial Vaginitis-Urine: POSITIVE — AB
Candida Urine: POSITIVE — AB
Chlamydia: NEGATIVE
Comment: NEGATIVE
Comment: NEGATIVE
Comment: NORMAL
Neisseria Gonorrhea: NEGATIVE
Trichomonas: POSITIVE — AB

## 2020-01-20 MED ORDER — METRONIDAZOLE 500 MG PO TABS
500.0000 mg | ORAL_TABLET | Freq: Two times a day (BID) | ORAL | 0 refills | Status: AC
Start: 2020-01-20 — End: 2020-01-27

## 2020-01-20 MED ORDER — FLUCONAZOLE 100 MG PO TABS: 100.0000 mg | ORAL_TABLET | Freq: Every day | ORAL | 0 refills | Status: DC

## 2020-01-20 NOTE — Telephone Encounter (Signed)
Called patient to give lab results, advised her I will refer her to Infectious disease for further evaluation.

## 2020-01-21 ENCOUNTER — Telehealth: Payer: Self-pay

## 2020-01-21 ENCOUNTER — Ambulatory Visit: Payer: Self-pay

## 2020-01-21 DIAGNOSIS — I10 Essential (primary) hypertension: Secondary | ICD-10-CM

## 2020-01-21 DIAGNOSIS — E119 Type 2 diabetes mellitus without complications: Secondary | ICD-10-CM

## 2020-01-21 NOTE — Chronic Care Management (AMB) (Signed)
  Care Management   Outreach Note  01/21/2020 Name: [name] MRN: [id number] DOB: 1955-12-17  Referred by: Arnette Felts, FNP Reason for referral : Care Coordination   SW placed an unsuccessful outbound call to the patient to assess for and assist with any identified care coordination needs SW left a HIPAA compliant voice message requesting a return call.  Follow Up Plan: The care management team will reach out to the patient again over the next 10 days.   Bevelyn Ngo, BSW, CDP Social Worker, Certified Dementia Practitioner [facility] Care Management [phone]

## 2020-01-22 ENCOUNTER — Other Ambulatory Visit: Payer: Self-pay

## 2020-01-22 ENCOUNTER — Ambulatory Visit: Payer: Self-pay

## 2020-01-22 ENCOUNTER — Encounter: Payer: Self-pay | Admitting: Nurse Practitioner

## 2020-01-22 ENCOUNTER — Telehealth: Payer: Self-pay

## 2020-01-22 DIAGNOSIS — E119 Type 2 diabetes mellitus without complications: Secondary | ICD-10-CM

## 2020-01-22 DIAGNOSIS — I10 Essential (primary) hypertension: Secondary | ICD-10-CM

## 2020-01-25 ENCOUNTER — Ambulatory Visit: Payer: Self-pay

## 2020-01-25 ENCOUNTER — Telehealth: Payer: Self-pay

## 2020-01-25 DIAGNOSIS — I10 Essential (primary) hypertension: Secondary | ICD-10-CM

## 2020-01-25 DIAGNOSIS — E119 Type 2 diabetes mellitus without complications: Secondary | ICD-10-CM

## 2020-01-25 DIAGNOSIS — F32A Depression, unspecified: Secondary | ICD-10-CM

## 2020-01-25 NOTE — Telephone Encounter (Signed)
Margaret [facility] called stating pt does not need to be scheduled for an appointment her test was a false positive. Pt does not have HIV per Nurse Practitioner Kendall Flack. [phone] [facility]

## 2020-01-25 NOTE — Chronic Care Management (AMB) (Signed)
  Chronic Care Management   Outreach Note  01/25/2020 Name: [name] MRN: [id number] DOB: 03/01/1956  Referred by: Arnette Felts, FNP Reason for referral : Care Coordination (RQ #2 Initial RN Call  )   A second unsuccessful telephone outreach was attempted today. The patient was referred to the case management team for assistance with care management and care coordination.   Follow Up Plan: A HIPPA compliant phone message was left for the patient providing contact information and requesting a return call.  Telephone follow up appointment with care management team member scheduled for: 02/19/20  Delsa Sale, RN, BSN, CCM Care Management Coordinator [facility] Care Management/[facility]  Direct Phone: [phone]

## 2020-01-25 NOTE — Patient Instructions (Signed)
Visit Information  Goals Addressed            This Visit's Progress   . Collaborate with RN Care Manager to perform approrpiate assessments to determine care management and care coordination needs       CARE PLAN ENTRY (see longitudinal plan of care for additional care plan information)  Current Barriers:  . Limited social support . New onset of symptoms which resulted in new referral to infectious disease . Chronic Conditions including DM II, HTN, and Depression  Social Work Clinical Goal(s):  [name] Over the next 10 days, patient will follow up with [facility] as directed by SW . Over the next 20 days the patient will engage with RN Care Manager to develop an individualized plan of care related to the management of patients chronic conditions . Over the next 90 days the patient will work with embedded care management SW to address on going care coordination needs  CCM SW Interventions: Completed 01/25/20 . Inter-disciplinary care team collaboration (see longitudinal plan of care) . Successful call with the patient to review care management needs and assist with ongoing care coordination needs . Discussed with the patient RN Care Manager, Delsa Sale, has attempted to engage the patient x 2  o Encouraged the patient to return Mrs. Little's call . " I am at a bad place in my life right now" o Discussed the patient is currently dealing with new medical concerns and feels very alone and depressed surrounding recent lab outcomes o Determined the patient does not have any support persons in her life she feels comfortable sharing this information with o Advised the patient SW was not qualified to provide a therapeutic relationship to the person but could assist with navigating health benefits to locate a counselor - patient agreeable . Collaboration with patients health plan to determine the patient's current health plan covers 100% of outpatient mental health services  including both in-person and tele-health visits . Obtained website address to review in-network mental health providers . Assisted the patient in accessing health plan website ([email or web address]) to review a list of in network providers o Identified the patient would like to contact [facility] [address] [phone] o Provided contact number to the patient for follow up o Determined the patient plans to outreach this counseling center at the end of today's outreach . Advised the patient to contact SW directly if this provider is not a good fit or unable to provide services as desired . Collaboration with Arnette Felts, FNP and Delsa Sale, RN Care Manager advising of plan for the patient to seek counseling services . Scheduled follow up call over the next 14 days to assess goal progression  Patient Self Care Activities:  . Self administers medications as prescribed . Attends all scheduled provider appointments . Calls pharmacy for medication refills . Performs ADL's independently . Calls provider office for new concerns or questions  Initial goal documentation        The patient verbalized understanding of instructions provided today and declined a print copy of patient instruction materials.   The care management team will reach out to the patient again over the next 10 days.   Bevelyn Ngo, BSW, CDP Social Worker, Certified Dementia Practitioner [facility] Care Management [phone]

## 2020-01-25 NOTE — Chronic Care Management (AMB) (Signed)
Care Management   Follow Up Note   01/25/2020 Name: [name] MRN: [id number] DOB: 10/05/1955  Referred by: [name], FNP Reason for referral : Care Coordination   [name] is a 64 y.o. year old female who is a primary care patient of [name], [place]. The care management team was consulted for assistance with care management and care coordination needs.    Review of patient status, including review of consultants reports, relevant laboratory and other test results, and collaboration with appropriate care team members and the patient's provider was performed as part of comprehensive patient evaluation and provision of chronic care management services.    SDOH (Social Determinants of Health) assessments performed: No  Advanced Directives: See Care Plan and Vynca application for related entries.   Goals Addressed            This Visit's Progress   . Collaborate with RN Care Manager to perform approrpiate assessments to determine care management and care coordination needs       CARE PLAN ENTRY (see longitudinal plan of care for additional care plan information)  Current Barriers:  . Limited social support . New onset of symptoms which resulted in new referral to infectious disease . Chronic Conditions including DM II, HTN, and Depression  Social Work Clinical Goal(s):  [name] Over the next 10 days, patient will follow up with [name] as directed by SW . Over the next 20 days the patient will engage with RN Care Manager to develop an individualized plan of care related to the management of patients chronic conditions . Over the next 90 days the patient will work with embedded care management SW to address on going care coordination needs  CCM SW Interventions: Completed 01/25/20 . Inter-disciplinary care team collaboration (see longitudinal plan of care) . Successful call with the patient to review care management needs and assist  with ongoing care coordination needs . Discussed with the patient RN Care Manager, Barb Merino, has attempted to engage the patient x 2  o Encouraged the patient to return Mrs. Little's call . " I am at a bad place in my life right now" o Discussed the patient is currently dealing with new medical concerns and feels very alone and depressed surrounding recent lab outcomes o Determined the patient does not have any support persons in her life she feels comfortable sharing this information with o Advised the patient SW was not qualified to provide a therapeutic relationship to the person but could assist with navigating health benefits to locate a counselor - patient agreeable . Collaboration with patients health plan to determine the patient's current health plan covers 100% of outpatient mental health services including both in-person and tele-health visits . Obtained website address to review in-network mental health providers . Assisted the patient in accessing health plan website ([email or web address]) to review a list of in network providers o Identified the patient would like to contact [facility] [address] [phone] o Provided contact number to the patient for follow up o Determined the patient plans to outreach this counseling center at the end of today's outreach . Advised the patient to contact SW directly if this provider is not a good fit or unable to provide services as desired . Collaboration with [name], FNP and Barb Merino, RN Care Manager advising of plan for the patient to seek counseling services . Scheduled follow up call over the next 14 days to assess  goal progression  Patient Self Care Activities:  . Self administers medications as prescribed . Attends all scheduled provider appointments . Calls pharmacy for medication refills . Performs ADL's independently . Calls provider office for new concerns or questions  Initial goal  documentation         The care management team will reach out to the patient again over the next 10 days.   Bevelyn Ngo, BSW, CDP Social Worker, Certified Dementia Practitioner [facility] Care Management [phone]  Total time spent performing care coordination and/or care management activities with the patient by phone or face to face = 30 minutes.

## 2020-01-26 ENCOUNTER — Telehealth: Payer: Self-pay

## 2020-01-26 ENCOUNTER — Other Ambulatory Visit: Payer: Self-pay

## 2020-01-26 ENCOUNTER — Ambulatory Visit: Payer: Self-pay

## 2020-01-26 ENCOUNTER — Other Ambulatory Visit: Payer: Self-pay | Admitting: Nurse Practitioner

## 2020-01-26 DIAGNOSIS — E119 Type 2 diabetes mellitus without complications: Secondary | ICD-10-CM

## 2020-01-26 DIAGNOSIS — F32A Depression, unspecified: Secondary | ICD-10-CM

## 2020-01-26 DIAGNOSIS — R5383 Other fatigue: Secondary | ICD-10-CM

## 2020-01-26 DIAGNOSIS — Z789 Other specified health status: Secondary | ICD-10-CM

## 2020-01-26 DIAGNOSIS — I10 Essential (primary) hypertension: Secondary | ICD-10-CM

## 2020-01-26 MED ORDER — ACCU-CHEK SOFTCLIX LANCETS MISC
2 refills | Status: DC
Start: 2020-01-26 — End: 2020-11-07

## 2020-01-26 MED ORDER — ACCU-CHEK AVIVA PLUS VI STRP: ORAL_STRIP | 2 refills | Status: DC

## 2020-01-26 MED ORDER — ACCU-CHEK AVIVA DEVI: 0 refills | Status: AC

## 2020-01-26 NOTE — Chronic Care Management (AMB) (Signed)
Care Management   Follow Up Note   01/26/2020 Name: [name] MRN: [id number] DOB: 12-03-1955  Referred by: [name], FNP Reason for referral : Care Coordination   [name] is a 64 y.o. year old female who is a primary care patient of [name], [place]. The care management team was consulted for assistance with care management and care coordination needs.    Review of patient status, including review of consultants reports, relevant laboratory and other test results, and collaboration with appropriate care team members and the patient's provider was performed as part of comprehensive patient evaluation and provision of chronic care management services.    SDOH (Social Determinants of Health) assessments performed: No See Care Plan activities for detailed interventions related to [facility])     Advanced Directives: See Care Plan and Vynca application for related entries.   Goals Addressed            This Visit's Progress   . Collaborate with RN Care Manager to perform approrpiate assessments to determine care management and care coordination needs       CARE PLAN ENTRY (see longitudinal plan of care for additional care plan information)  Current Barriers:  . Limited social support . New onset of symptoms which resulted in new referral to infectious disease . Chronic Conditions including DM II, HTN, and Depression  Social Work Clinical Goal(s):  [name] Over the next 10 days, patient will follow up with [name] as directed by SW Goal Met; unable to be seen until July 2021 . Over the next 20 days the patient will engage with RN Care Manager to develop an individualized plan of care related to the management of patients chronic conditions . Over the next 90 days the patient will work with embedded care management SW to address on going care coordination needs  CCM SW Interventions: Completed 01/26/20 . Inbound call received from the  patient to report she did follow up with [name]. Unfortunately, there is no availability until July 2021 . Assisted the patient in accessing [facility] website to identify alternative counselors within the patients network . Provided the patient with 3 separate counselor name and contact numbers within a 10 mile radius of her home o [name], [phone] o [name] [name] [phone] . Advised the patient to contact SW directly if she is unable to find assistance as desired . Collaboration with the patients primary care provider, [name] FNP and [name] updating on goal progression  . Scheduled follow up call to the patient over the next two weeks  Completed 01/25/20 . Inter-disciplinary care team collaboration (see longitudinal plan of care) . Successful call with the patient to review care management needs and assist with ongoing care coordination needs . Discussed with the patient RN Care Manager, Barb Merino, has attempted to engage the patient x 2  o Encouraged the patient to return Mrs. Little's call . " I am at a bad place in my life right now" o Discussed the patient is currently dealing with new medical concerns and feels very alone and depressed surrounding recent lab outcomes o Determined the patient does not have any support persons in her life she feels comfortable sharing this information with o Advised the patient SW was not qualified to provide a therapeutic relationship to the person but could assist with navigating health benefits to locate a counselor - patient agreeable .  Collaboration with patients health plan to determine the patient's current health plan covers 100% of outpatient mental health services including both in-person and tele-health visits . Obtained website address to review in-network mental health providers . Assisted the patient in accessing health plan  website ([employer]com) to review a list of in network providers o Identified the patient would like to contact [name] [address] [phone] o Provided contact number to the patient for follow up o Determined the patient plans to outreach this counseling center at the end of today's outreach . Advised the patient to contact SW directly if this provider is not a good fit or unable to provide services as desired . Collaboration with [name], FNP and Barb Merino, RN Care Manager advising of plan for the patient to seek counseling services . Scheduled follow up call over the next 14 days to assess goal progression  Patient Self Care Activities:  . Self administers medications as prescribed . Attends all scheduled provider appointments . Calls pharmacy for medication refills . Performs ADL's independently . Calls provider office for new concerns or questions  Please see past updates related to this goal by clicking on the "Past Updates" button in the selected goal          The care management team will reach out to the patient again over the next 14 days.   Daneen Schick, BSW, CDP Social Worker, Certified Dementia Practitioner [place] / [place] [facility] [phone]

## 2020-01-26 NOTE — Patient Instructions (Signed)
Visit Information  Goals Addressed            This Visit's Progress   . Collaborate with RN Care Manager to perform approrpiate assessments to determine care management and care coordination needs       CARE PLAN ENTRY (see longitudinal plan of care for additional care plan information)  Current Barriers:  . Limited social support . New onset of symptoms which resulted in new referral to infectious disease . Chronic Conditions including DM II, HTN, and Depression  Social Work Clinical Goal(s):  [name] Over the next 10 days, patient will follow up with [name] as directed by SW Goal Met; unable to be seen until July 2021 . Over the next 20 days the patient will engage with RN Care Manager to develop an individualized plan of care related to the management of patients chronic conditions . Over the next 90 days the patient will work with embedded care management SW to address on going care coordination needs  CCM SW Interventions: Completed 01/26/20 . Inbound call received from the patient to report she did follow up with [name]. Unfortunately, there is no availability until July 2021 . Assisted the patient in accessing [facility] website to identify alternative counselors within the patients network . Provided the patient with 3 separate counselor name and contact numbers within a 10 mile radius of her home o [place] [facility], [phone] o [address], [place] [facility] [phone] . Advised the patient to contact SW directly if she is unable to find assistance as desired . Collaboration with the patients primary care provider, [name] FNP and [place] updating on goal progression  . Scheduled follow up call to the patient over the next two weeks  Completed 01/25/20 . Inter-disciplinary care team collaboration (see longitudinal plan of care) . Successful call with the patient to  review care management needs and assist with ongoing care coordination needs . Discussed with the patient RN Care Manager, Barb Merino, has attempted to engage the patient x 2  o Encouraged the patient to return Mrs. Little's call . " I am at a bad place in my life right now" o Discussed the patient is currently dealing with new medical concerns and feels very alone and depressed surrounding recent lab outcomes o Determined the patient does not have any support persons in her life she feels comfortable sharing this information with o Advised the patient SW was not qualified to provide a therapeutic relationship to the person but could assist with navigating health benefits to locate a counselor - patient agreeable . Collaboration with patients health plan to determine the patient's current health plan covers 100% of outpatient mental health services including both in-person and tele-health visits . Obtained website address to review in-network mental health providers . Assisted the patient in accessing health plan website ([email or web address]) to review a list of in network providers o Identified the patient would like to contact [facility] [address] [phone] o Provided contact number to the patient for follow up o Determined the patient plans to outreach this counseling center at the end of today's outreach . Advised the patient to contact SW directly if this provider is not a good fit or unable to provide services as desired . Collaboration with [name], FNP and Barb Merino, RN Care Manager advising of plan for the patient to seek counseling services . Scheduled follow  up call over the next 14 days to assess goal progression  Patient Self Care Activities:  . Self administers medications as prescribed . Attends all scheduled provider appointments . Calls pharmacy for medication refills . Performs ADL's independently . Calls provider office for  new concerns or questions  Please see past updates related to this goal by clicking on the "Past Updates" button in the selected goal         The care management team will reach out to the patient again over the next 14 days.   Daneen Schick, BSW, CDP Social Worker, Certified Dementia Practitioner [facility] [phone]

## 2020-01-26 NOTE — Progress Notes (Signed)
Received call from Infectious disease reporting that her HIV screening is actually negative. I will still refer her for the elevated CMV

## 2020-01-28 ENCOUNTER — Encounter: Payer: Self-pay | Admitting: Nurse Practitioner

## 2020-01-28 ENCOUNTER — Telehealth: Payer: Self-pay

## 2020-01-28 NOTE — Chronic Care Management (AMB) (Signed)
Care Management   Initial Visit Note  01/26/2020 Name: [name] MRN: [id number] DOB: 30-Dec-1955  Referred by: Arnette Felts, FNP Reason for referral : Care Coordination (RQ Initial RN CC Call )   [name] is a 64 y.o. year old female who is a primary care patient of Arnette Felts, FNP. The CCM team was consulted for assistance with chronic disease management and care coordination needs related to HTN, DMII, Anxiety, Depression and Iron deficieny Anemia  Review of patient status, including review of consultants reports, relevant laboratory and other test results, and collaboration with appropriate care team members and the patient's provider was performed as part of comprehensive patient evaluation and provision of chronic care management services.    SDOH (Social Determinants of Health) assessments performed: Yes See Care Plan activities for detailed interventions related to SDOH   Placed initial outbound call to patient to assess for CC needs, a care plan was established.     Medications: Outpatient Encounter Medications as of 01/26/2020  Medication Sig  . amLODipine (NORVASC) 5 MG tablet TAKE 1 TABLET (5 MG TOTAL) BY MOUTH DAILY.  [name] aspirin EC 325 MG EC tablet Take 1 tablet (325 mg total) by mouth daily. Take x 1 month post op to decrease risk of blood clots. (Patient taking differently: Take 325 mg by mouth daily. )  . Continuous Blood Gluc Receiver (FREESTYLE LIBRE READER) DEVI 1 each by Does not apply route 4 (four) times daily.  . Continuous Blood Gluc Sensor (FREESTYLE LIBRE SENSOR SYSTEM) MISC 1 each by Does not apply route 4 (four) times daily.  . ferrous sulfate 325 (65 FE) MG tablet Take 1 tablet (325 mg total) by mouth 2 (two) times daily with a meal.  . fluconazole (DIFLUCAN) 100 MG tablet Take 1 tablet (100 mg total) by mouth daily. Take 1 tablet by mouth now repeat in 5 days  . folic acid (FOLVITE) 1 MG tablet Take 1 tablet (1 mg total) by mouth daily.    . Insulin Degludec-Liraglutide (XULTOPHY) 100-3.6 UNIT-MG/ML SOPN Inject 24 Units into the skin daily. (Patient taking differently: Inject 19 Units into the skin daily. )  . Insulin Pen Needle (BD PEN NEEDLE NANO U/F) 32G X 4 MM MISC USE FOR 1 INJECTION DAILY  . [EXPIRED] metroNIDAZOLE (FLAGYL) 500 MG tablet Take 1 tablet (500 mg total) by mouth 2 (two) times daily for 7 days.  . valsartan (DIOVAN) 160 MG tablet TAKE ONE TABLET BY MOUTH ONCE DAILY (Patient taking differently: Take 160 mg by mouth daily. )   No facility-administered encounter medications on file as of 01/26/2020.     Objective:  Lab Results  Component Value Date   HGBA1C 5.5 01/12/2020   HGBA1C 7.2 (H) 11/18/2019   HGBA1C 7.0 (H) 07/28/2019   Lab Results  Component Value Date   MICROALBUR 150 01/12/2020   LDLCALC 80 11/18/2019   CREATININE 0.97 01/12/2020   BP Readings from Last 3 Encounters:  01/12/20 140/80  01/08/20 133/68  01/05/20 130/78    Goals Addressed      Patient Stated   . "I am concerned about my test results" (pt-stated)       CARE PLAN ENTRY (see longitudinal plan of care for additional care plan information)  Current Barriers:  [name] Knowledge Deficits related to ongoing evaluation and treatment of abnormal lab results including positive CMV Ab - IgG . Chronic Disease Management support and education needs related to Depression, Essential HTN, Iron-deficiency Anemia, DMII  Nurse  Case Manager Clinical Goal(s):  [name] Over the next 90 days, patient will work with the CCM team and PCP  to address needs related to evaluation and treatment of positive CMV Ab-IgG  CCM RN CM Interventions:  01/26/20 call completed with patient  . Inter-disciplinary care team collaboration (see longitudinal plan of care) . Evaluation of current treatment plan related to Postivie CMV Ab-IgG  and patient's adherence to plan as established by provider . Determined patient is experiencing depression and anxiety due to having  abnormal lab results suggesting a serious infection  . Provided active listening to patient and validated her feelings of despair and concern . Reviewed and discussed abnormal lab results and provided education concerning rationale for re-evaluation approximately 3 months from initial positive results . Discussed the ID referral was denied but PCP plans to resend with further explanation of reason for referral . Discussed patient has spoken with embedded BSW and had contacted the appropriate mental health care providers in which she plans to initiate counseling . Determined patient has suffered from depression in the past at which time she received counseling and was prescribed Cymbalta . Determined she has been off Cymbalta for several year and would like for PCP to prescribe a new antidepressant . Reiterated further testing that will need to be performed at next PCP visit in August to confirm if antibodies are present and or CMV infection has resolved . Discussed patient is feeling more hopeful after speaking with CCM team and is thankful for this service . Collaborated with PCP Arnette Felts, FNP regarding need to resent ID referral for evaluation and treatment of positive CMV; advised of patient's past use of Cymbalta and request to start a new antidepressant therapy . Discussed plans with patient for ongoing care management follow up and provided patient with direct contact information for care management team . Reviewed scheduled/upcoming provider appointments including: next PCP OV scheduled with PCP Arnette Felts, FNP set for 04/13/20 @2 :15 PM  Patient Self Care Activities:  . Patient verbalizes understanding of plan to f/u with PCP again in August for re-evaluation of abnormal labs . Self administers medications as prescribed . Attends all scheduled provider appointments . Calls pharmacy for medication refills . Calls provider office for new concerns or questions  Initial goal  documentation     . "to have less fatigue" (pt-stated)       CARE PLAN ENTRY (see longitudinal plan of care for additional care plan information)  Current Barriers:  September Knowledge Deficits related to disease process and Self Health management of Iron Deficiency Anemia . Chronic Disease Management support and education needs related to Depression, Essential Hypertension, Iron deficiency Anemia, DMII  Nurse Case Manager Clinical Goal(s):  [name] Over the next 90 days, patient will work with the CCM team and PCP to address needs related to disease education and support to improve Self Health management of Anemia  CCM RN CM Interventions:  01/26/20 call completed with patient  . Inter-disciplinary care team collaboration (see longitudinal plan of care) . Evaluation of current treatment plan related to Iron deficiency Anemia and patient's adherence to plan as established by provider. . Reviewed medications with patient and discussed patient is adhering to taking an iron supplement as directed for management of her Anemia . Discussed plans with patient for ongoing care management follow up and provided patient with direct contact information for care management team . Provided patient with printed educational materials related to [employer]  Patient Self Care Activities:  .  Self administers medications as prescribed . Attends all scheduled provider appointments . Calls pharmacy for medication refills . Calls provider office for new concerns or questions  Initial goal documentation     . "to keep my BP well controlled" (pt-stated)       CARE PLAN ENTRY (see longitudinal plan of care for additional care plan information)  Current Barriers:  [name] Knowledge Deficits related to disease process and Self Health management of HTN . Chronic Disease Management support and education needs related to Depression, Essential Hypertension, Iron deficiency Anemia, DMII  Nurse Case Manager Clinical  Goal(s):  [name] Over the next 90 days, patient will work with the CCM team and PCP to address needs related to disease education and support to improve Self Health management of HTN  CCM RN CM Interventions:  01/26/20 call completed with patient  . Inter-disciplinary care team collaboration (see longitudinal plan of care) . Evaluation of current treatment plan related to HTN and patient's adherence to plan as established by provider. . Provided education to patient re: target BP < 130/80; Reviewed and discussed patient does not have a BP cuff at home but does spot check her BP when visiting her local pharmacy; Reviewed and discussed last OV BP of 140/80, HR of 67 . Reviewed medications with patient and discussed indication, dosage and frequency of prescribed antihypertensive medications, patient reports adherence . Discussed plans with patient for ongoing care management follow up and provided patient with direct contact information for care management team . Provided patient with printed educational materials related to What is High Blood Pressure?; African-Americans and High Blood Pressure; Why Should I Lower Sodium?; High Blood Pressure and Stroke; Life's Simple 7  Patient Self Care Activities:  . Self administers medications as prescribed . Attends all scheduled provider appointments . Calls pharmacy for medication refills . Calls provider office for new concerns or questions  Initial goal documentation       Other   . COMPLETED: Assist with Chronic Care Management and Care Coordination needs       Current Barriers:  [name] Knowledge Barriers related to resources and support available to address needs related to Chronic disease management and Care Coordination   Case Manager Clinical Goal(s):  [name] Over the next 30 days, patient will work with the CCM team to address needs related to Chronic Care Management and Care Coordination needs.   Interventions:  . Collaborated with BSW and initiated  plan of care to address needs related to Chronic Care Management and [employer].   Patient Self Care Activities:  . Attends all scheduled provider appointments . Calls pharmacy for medication refills . Performs ADL's independently . Calls provider office for new concerns or questions  Initial goal documentation       Plan:   Telephone follow up appointment with care management team member scheduled for: 02/19/20  Barb Merino, RN, BSN, CCM Care Management Coordinator [facility]  Direct Phone: [phone]

## 2020-01-28 NOTE — Patient Instructions (Signed)
Visit Information  Goals Addressed      Patient Stated   . "I am concerned about my test results" (pt-stated)       CARE PLAN ENTRY (see longitudinal plan of care for additional care plan information)  Current Barriers:  [name] Knowledge Deficits related to ongoing evaluation and treatment of abnormal lab results including positive CMV Ab - IgG . Chronic Disease Management support and education needs related to Depression, Essential HTN, Iron-deficiency Anemia, DMII  Nurse Case Manager Clinical Goal(s):  [name] Over the next 90 days, patient will work with the CCM team and PCP  to address needs related to evaluation and treatment of positive CMV Ab-IgG  CCM RN CM Interventions:  01/26/20 call completed with patient  . Inter-disciplinary care team collaboration (see longitudinal plan of care) . Evaluation of current treatment plan related to Postivie CMV Ab-IgG  and patient's adherence to plan as established by provider . Determined patient is experiencing depression and anxiety due to having abnormal lab results suggesting a serious infection  . Provided active listening to patient and validated her feelings of despair and concern . Reviewed and discussed abnormal lab results and provided education concerning rationale for re-evaluation approximately 3 months from initial positive results . Discussed the ID referral was denied but PCP plans to resend with further explanation of reason for referral . Discussed patient has spoken with embedded BSW and had contacted the appropriate mental health care providers in which she plans to initiate counseling . Determined patient has suffered from depression in the past at which time she received counseling and was prescribed Cymbalta . Determined she has been off Cymbalta for several year and would like for PCP to prescribe a new antidepressant . Reiterated further testing that will need to be performed at next PCP visit in August to confirm if antibodies are  present and or CMV infection has resolved . Discussed patient is feeling more hopeful after speaking with CCM team and is thankful for this service . Collaborated with PCP Arnette Felts, FNP regarding need to resent ID referral for evaluation and treatment of positive CMV; advised of patient's past use of Cymbalta and request to start a new antidepressant therapy . Discussed plans with patient for ongoing care management follow up and provided patient with direct contact information for care management team . Reviewed scheduled/upcoming provider appointments including: next PCP OV scheduled with PCP Arnette Felts, FNP set for 04/13/20 @2 :15 PM  Patient Self Care Activities:  . Patient verbalizes understanding of plan to f/u with PCP again in August for re-evaluation of abnormal labs . Self administers medications as prescribed . Attends all scheduled provider appointments . Calls pharmacy for medication refills . Calls provider office for new concerns or questions  Initial goal documentation     . "to have less fatigue" (pt-stated)       CARE PLAN ENTRY (see longitudinal plan of care for additional care plan information)  Current Barriers:  September Knowledge Deficits related to disease process and Self Health management of Iron Deficiency Anemia . Chronic Disease Management support and education needs related to Depression, Essential Hypertension, Iron deficiency Anemia, DMII  Nurse Case Manager Clinical Goal(s):  [name] Over the next 90 days, patient will work with the CCM team and PCP to address needs related to disease education and support to improve Self Health management of Anemia  CCM RN CM Interventions:  01/26/20 call completed with patient  . Inter-disciplinary care team collaboration (see longitudinal plan of care) .  Evaluation of current treatment plan related to Iron deficiency Anemia and patient's adherence to plan as established by provider. . Reviewed medications with patient and  discussed patient is adhering to taking an iron supplement as directed for management of her Anemia . Discussed plans with patient for ongoing care management follow up and provided patient with direct contact information for care management team . Provided patient with printed educational materials related to [place]  Patient Self Care Activities:  . Self administers medications as prescribed . Attends all scheduled provider appointments . Calls pharmacy for medication refills . Calls provider office for new concerns or questions  Initial goal documentation     . "to keep my BP well controlled" (pt-stated)       CARE PLAN ENTRY (see longitudinal plan of care for additional care plan information)  Current Barriers:  [name] Knowledge Deficits related to disease process and Self Health management of HTN . Chronic Disease Management support and education needs related to Depression, Essential Hypertension, Iron deficiency Anemia, DMII  Nurse Case Manager Clinical Goal(s):  [name] Over the next 90 days, patient will work with the CCM team and PCP to address needs related to disease education and support to improve Self Health management of HTN  CCM RN CM Interventions:  01/26/20 call completed with patient  . Inter-disciplinary care team collaboration (see longitudinal plan of care) . Evaluation of current treatment plan related to HTN and patient's adherence to plan as established by provider. . Provided education to patient re: target BP < 130/80; Reviewed and discussed patient does not have a BP cuff at home but does spot check her BP when visiting her local pharmacy; Reviewed and discussed last OV BP of 140/80, HR of 67 . Reviewed medications with patient and discussed indication, dosage and frequency of prescribed antihypertensive medications, patient reports adherence . Discussed plans with patient for ongoing care management follow up and provided patient with direct contact  information for care management team . Provided patient with printed educational materials related to What is High Blood Pressure?; African-Americans and High Blood Pressure; Why Should I Lower Sodium?; High Blood Pressure and Stroke; Life's Simple 7  Patient Self Care Activities:  . Self administers medications as prescribed . Attends all scheduled provider appointments . Calls pharmacy for medication refills . Calls provider office for new concerns or questions  Initial goal documentation       Other   . COMPLETED: Assist with Chronic Care Management and Care Coordination needs       Current Barriers:  [name] Knowledge Barriers related to resources and support available to address needs related to Chronic disease management and Care Coordination   Case Manager Clinical Goal(s):  [name] Over the next 30 days, patient will work with the CCM team to address needs related to Chronic Care Management and Care Coordination needs.   Interventions:  . Collaborated with BSW and initiated plan of care to address needs related to Chronic Care Management and [employer].   Patient Self Care Activities:  . Attends all scheduled provider appointments . Calls pharmacy for medication refills . Performs ADL's independently . Calls provider office for new concerns or questions  Initial goal documentation        Patient verbalizes understanding of instructions provided today.   Telephone follow up appointment with care management team member scheduled for: 02/19/20  Barb Merino, RN, BSN, CCM Care Management Coordinator [facility]  Direct Phone: [phone]

## 2020-01-28 NOTE — Telephone Encounter (Signed)
Patient notified to decrease her xultophy 15 units daily and continue to check her blood sugars. YL,RMA

## 2020-01-29 ENCOUNTER — Ambulatory Visit: Payer: Self-pay

## 2020-01-29 DIAGNOSIS — I10 Essential (primary) hypertension: Secondary | ICD-10-CM

## 2020-01-29 NOTE — Patient Instructions (Signed)
Visit Information  Goals Addressed            This Visit's Progress   . Collaborate with RN Care Manager to perform approrpiate assessments to determine care management and care coordination needs   On track    [place] (see longitudinal plan of care for additional care plan information)  Current Barriers:  . Limited social support . New onset of symptoms which resulted in new referral to infectious disease . Chronic Conditions including DM II, HTN, and Depression  Social Work Clinical Goal(s):  [name] Over the next 10 days, patient will follow up with [name] as directed by SW Goal Met; unable to be seen until July 2021 . Over the next 20 days the patient will engage with RN Care Manager to develop an individualized plan of care related to the management of patients chronic conditions Goal Met; see individual goals established by RN Care Manager . Over the next 90 days the patient will work with embedded care management SW to address on going care coordination needs  CCM SW Interventions: Completed 01/29/20 . Collaboration with RN Care Manager whom reports the patient is in need of a blood pressure monitoring system in the home . Successful outbound call placed to the patient to discuss opportunity to contact patients health plan regarding health plan benefits . Assisted the patient in contacting health plan o Representative Kim informed the patient she has met her yearly deductible but has yet to meet maximum out of pocket which meant the health plan would cover a blood pressure monitoring system at 70%, leaving 30% of the cost the patients responsibility . Advised the patient SW would request orders from her primary care provider to obtain equipment . Collaboration with [name], FNP to request prescription for a blood pressure monitoring device . Scheduled follow up over the next week to assess goal progression  Patient Self Care Activities:   . Self administers medications as prescribed . Attends all scheduled provider appointments . Calls pharmacy for medication refills . Performs ADL's independently . Calls provider office for new concerns or questions  Please see past updates related to this goal by clicking on the "Past Updates" button in the selected goal         The patient verbalized understanding of instructions provided today and declined a print copy of patient instruction materials.   The care management team will reach out to the patient again over the next 14 days.   Daneen Schick, BSW, CDP Social Worker, Certified Dementia Practitioner [place] [facility] [place] [facility] [phone]

## 2020-01-29 NOTE — Chronic Care Management (AMB) (Signed)
Care Management    Social Work Follow Up Note  01/29/2020 Name: [name] MRN: [id number] DOB: 1956-04-11  [name] is a 64 y.o. year old female who is a primary care patient of [name], [place]. The CCM team was consulted for assistance with care coordination.   Review of patient status, including review of consultants reports, other relevant assessments, and collaboration with appropriate care team members and the patient's provider was performed as part of comprehensive patient evaluation and provision of chronic care management services.    SDOH (Social Determinants of Health) assessments performed: No    Outpatient Encounter Medications as of 01/29/2020  Medication Sig  . Accu-Chek Softclix Lancets lancets Use as instructed  . amLODipine (NORVASC) 5 MG tablet TAKE 1 TABLET (5 MG TOTAL) BY MOUTH DAILY.  [name] aspirin EC 325 MG EC tablet Take 1 tablet (325 mg total) by mouth daily. Take x 1 month post op to decrease risk of blood clots. (Patient taking differently: Take 325 mg by mouth daily. )  . Blood Glucose Monitoring Suppl (ACCU-CHEK AVIVA) device Use to check blood sugars daily E11.9  . Continuous Blood Gluc Receiver (FREESTYLE LIBRE READER) DEVI 1 each by Does not apply route 4 (four) times daily.  . Continuous Blood Gluc Sensor (FREESTYLE LIBRE SENSOR SYSTEM) MISC 1 each by Does not apply route 4 (four) times daily.  . ferrous sulfate 325 (65 FE) MG tablet Take 1 tablet (325 mg total) by mouth 2 (two) times daily with a meal.  . fluconazole (DIFLUCAN) 100 MG tablet Take 1 tablet (100 mg total) by mouth daily. Take 1 tablet by mouth now repeat in 5 days  . folic acid (FOLVITE) 1 MG tablet Take 1 tablet (1 mg total) by mouth daily.  [name] glucose blood (ACCU-CHEK AVIVA PLUS) test strip Use as instructed  . Insulin Degludec-Liraglutide (XULTOPHY) 100-3.6 UNIT-MG/ML SOPN Inject 24 Units into the skin daily. (Patient taking differently: Inject 15 Units into the skin daily. )   . Insulin Pen Needle (BD PEN NEEDLE NANO U/F) 32G X 4 MM MISC USE FOR 1 INJECTION DAILY  . valsartan (DIOVAN) 160 MG tablet TAKE ONE TABLET BY MOUTH ONCE DAILY (Patient taking differently: Take 160 mg by mouth daily. )   No facility-administered encounter medications on file as of 01/29/2020.     Goals Addressed            This Visit's Progress   . Collaborate with RN Care Manager to perform approrpiate assessments to determine care management and care coordination needs   On track    [place] (see longitudinal plan of care for additional care plan information)  Current Barriers:  . Limited social support . New onset of symptoms which resulted in new referral to infectious disease . Chronic Conditions including DM II, HTN, and Depression  Social Work Clinical Goal(s):  [name] Over the next 10 days, patient will follow up with [name] as directed by SW Goal Met; unable to be seen until July 2021 . Over the next 20 days the patient will engage with RN Care Manager to develop an individualized plan of care related to the management of patients chronic conditions Goal Met; see individual goals established by RN Care Manager . Over the next 90 days the patient will work with embedded care management SW to address on going care coordination needs  CCM SW Interventions: Completed 01/29/20 . Collaboration with RN Care Manager whom reports the patient  is in need of a blood pressure monitoring system in the home . Successful outbound call placed to the patient to discuss opportunity to contact patients health plan regarding health plan benefits . Assisted the patient in contacting health plan o Representative Kim informed the patient she has met her yearly deductible but has yet to meet maximum out of pocket which meant the health plan would cover a blood pressure monitoring system at 70%, leaving 30% of the cost the patients responsibility . Advised the patient SW  would request orders from her primary care provider to obtain equipment . Collaboration with [name], FNP to request prescription for a blood pressure monitoring device . Scheduled follow up over the next week to assess goal progression   Patient Self Care Activities:  . Self administers medications as prescribed . Attends all scheduled provider appointments . Calls pharmacy for medication refills . Performs ADL's independently . Calls provider office for new concerns or questions  Please see past updates related to this goal by clicking on the "Past Updates" button in the selected goal          Follow Up Plan: SW will follow up with patient by phone over the next week.   Daneen Schick, BSW, CDP Social Worker, Certified Dementia Practitioner [place] [facility] [place] [facility] [phone]  Total time spent performing care coordination and/or care management activities with the patient by phone or face to face = 14 minutes.

## 2020-02-02 ENCOUNTER — Ambulatory Visit: Payer: Self-pay

## 2020-02-02 DIAGNOSIS — I10 Essential (primary) hypertension: Secondary | ICD-10-CM

## 2020-02-02 DIAGNOSIS — F32A Depression, unspecified: Secondary | ICD-10-CM

## 2020-02-02 NOTE — Patient Instructions (Signed)
Visit Information  Goals Addressed            This Visit's Progress   . Collaborate with RN Care Manager to perform approrpiate assessments to determine care management and care coordination needs       CARE PLAN ENTRY (see longitudinal plan of care for additional care plan information)  Current Barriers:  . Limited social support . New onset of symptoms which resulted in new referral to infectious disease . Chronic Conditions including DM II, HTN, and Depression  Social Work Clinical Goal(s):  [name] Over the next 10 days, patient will follow up with [name] as directed by SW Goal Met; unable to be seen until July 2021 . Over the next 20 days the patient will engage with RN Care Manager to develop an individualized plan of care related to the management of patients chronic conditions Goal Met; see individual goals established by RN Care Manager . Over the next 90 days the patient will work with embedded care management SW to address on going care coordination needs  CCM SW Interventions: Completed 02/02/20 . Collaboration with RN Care Manager whom reports the patient has left a voice message requesting contact information to mental health provider she was referred to by [name], [place] . Performed chart review to note patient referred to [facility] . Successful outbound call placed to the patient to inform of location and contact number . Advised the patient to contact SW as needed prior to next scheduled call . Scheduled follow up over the next two weeks to assess goal progression   Patient Self Care Activities:  . Self administers medications as prescribed . Attends all scheduled provider appointments . Calls pharmacy for medication refills . Performs ADL's independently . Calls provider office for new concerns or questions  Please see past updates related to this goal by clicking on the "Past Updates" button in the selected goal          The patient verbalized understanding of instructions provided today and declined a print copy of patient instruction materials.   The care management team will reach out to the patient again over the next 14 days.   Daneen Schick, BSW, CDP Social Worker, Certified Dementia Practitioner [place] [facility] [place] [facility] [phone]

## 2020-02-02 NOTE — Chronic Care Management (AMB) (Signed)
  Care Management   Follow Up Note   02/02/2020 Name: Kristin ZUNKER MRN: [id number] DOB: 03-07-1956  Referred by: [name], FNP Reason for referral : Care Coordination   [name] is a 64 y.o. year old female who is a primary care patient of [name], [place]. The care management team was consulted for assistance with care management and care coordination needs.    Review of patient status, including review of consultants reports, relevant laboratory and other test results, and collaboration with appropriate care team members and the patient's provider was performed as part of comprehensive patient evaluation and provision of chronic care management services.    SDOH (Social Determinants of Health) assessments performed: No See Care Plan activities for detailed interventions related to [facility])     Advanced Directives: See Care Plan and Vynca application for related entries.   Goals Addressed            This Visit's Progress   . Collaborate with RN Care Manager to perform approrpiate assessments to determine care management and care coordination needs       CARE PLAN ENTRY (see longitudinal plan of care for additional care plan information)  Current Barriers:  . Limited social support . New onset of symptoms which resulted in new referral to infectious disease . Chronic Conditions including DM II, HTN, and Depression  Social Work Clinical Goal(s):  [name] Over the next 10 days, patient will follow up with [name] as directed by SW Goal Met; unable to be seen until July 2021 . Over the next 20 days the patient will engage with RN Care Manager to develop an individualized plan of care related to the management of patients chronic conditions Goal Met; see individual goals established by RN Care Manager . Over the next 90 days the patient will work with embedded care management SW to address on going care coordination needs  CCM SW  Interventions: Completed 02/02/20 . Collaboration with RN Care Manager whom reports the patient has left a voice message requesting contact information to mental health provider she was referred to by [name], [place] . Performed chart review to note patient referred to [facility] . Successful outbound call placed to the patient to inform of location and contact number . Advised the patient to contact SW as needed prior to next scheduled call . Scheduled follow up over the next two weeks to assess goal progression   Patient Self Care Activities:  . Self administers medications as prescribed . Attends all scheduled provider appointments . Calls pharmacy for medication refills . Performs ADL's independently . Calls provider office for new concerns or questions  Please see past updates related to this goal by clicking on the "Past Updates" button in the selected goal          The care management team will reach out to the patient again over the next 14 days.   Daneen Schick, BSW, CDP Social Worker, Certified Dementia Practitioner [place] [facility] [place] [facility] [phone]

## 2020-02-03 ENCOUNTER — Telehealth: Payer: Self-pay

## 2020-02-04 ENCOUNTER — Other Ambulatory Visit: Payer: Self-pay

## 2020-02-04 ENCOUNTER — Ambulatory Visit
Admission: RE | Admit: 2020-02-04 | Discharge: 2020-02-04 | Disposition: A | Discharge status: Home / Self Care | Payer: BC Managed Care – PPO | Source: Ambulatory Visit | Attending: Nurse Practitioner | Admitting: Nurse Practitioner

## 2020-02-04 DIAGNOSIS — Z1231 Encounter for screening mammogram for malignant neoplasm of breast: Secondary | ICD-10-CM

## 2020-02-10 ENCOUNTER — Other Ambulatory Visit: Payer: Self-pay

## 2020-02-10 DIAGNOSIS — R350 Frequency of micturition: Secondary | ICD-10-CM

## 2020-02-10 LAB — HM DIABETES EYE EXAM

## 2020-02-11 ENCOUNTER — Other Ambulatory Visit (HOSPITAL_COMMUNITY)
Admission: RE | Admit: 2020-02-11 | Discharge: 2020-02-11 | Disposition: A | Discharge status: Home / Self Care | Payer: BC Managed Care – PPO | Source: Ambulatory Visit | Attending: Nurse Practitioner | Admitting: Nurse Practitioner

## 2020-02-11 ENCOUNTER — Other Ambulatory Visit: Payer: Self-pay

## 2020-02-11 DIAGNOSIS — R350 Frequency of micturition: Secondary | ICD-10-CM | POA: Insufficient documentation

## 2020-02-11 LAB — URINE CULTURE

## 2020-02-15 ENCOUNTER — Ambulatory Visit: Payer: Self-pay

## 2020-02-15 DIAGNOSIS — I10 Essential (primary) hypertension: Secondary | ICD-10-CM

## 2020-02-15 NOTE — Chronic Care Management (AMB) (Signed)
  Care Management   Follow Up Note   02/15/2020 Name: [name] MRN: [id number] DOB: 10/18/1955  Referred by: [name], FNP Reason for referral : Care Coordination   [name] is a 64 y.o. year old female who is a primary care patient of [name], [place]. The care management team was consulted for assistance with care management and care coordination needs.    Review of patient status, including review of consultants reports, relevant laboratory and other test results, and collaboration with appropriate care team members and the patient's provider was performed as part of comprehensive patient evaluation and provision of chronic care management services.    SDOH (Social Determinants of Health) assessments performed: No See Care Plan activities for detailed interventions related to [facility])     Advanced Directives: See Care Plan and Vynca application for related entries.   Goals Addressed            This Visit's Progress   . Collaborate with RN Care Manager to perform approrpiate assessments to determine care management and care coordination needs       CARE PLAN ENTRY (see longitudinal plan of care for additional care plan information)  Current Barriers:  . Limited social support . New onset of symptoms which resulted in new referral to infectious disease . Chronic Conditions including DM II, HTN, and Depression  Social Work Clinical Goal(s):  [name] Over the next 10 days, patient will follow up with [name] as directed by SW Goal Met; unable to be seen until July 2021 . Over the next 20 days the patient will engage with RN Care Manager to develop an individualized plan of care related to the management of patients chronic conditions Goal Met; see individual goals established by RN Care Manager . Over the next 90 days the patient will work with embedded care management SW to address on going care coordination needs  CCM SW  Interventions: Completed 02/15/20 . Successful outbound call placed to the patient to assess for goal progression . Determined the patient has yet to receive blood pressure monitor . Outbound call placed to [facility] who reports no orders have been received . Collaboration with [name], FNP to request order be re-sent to [place] via fax to [phone] . Scheduled follow up call over the next two weeks to confirm equipment received   Patient Self Care Activities:  . Self administers medications as prescribed . Attends all scheduled provider appointments . Calls pharmacy for medication refills . Performs ADL's independently . Calls provider office for new concerns or questions  Please see past updates related to this goal by clicking on the "Past Updates" button in the selected goal          The care management team will reach out to the patient again over the next 14 days.   Daneen Schick, BSW, CDP Social Worker, Certified Dementia Practitioner [place] [facility] [place] [facility] [phone]

## 2020-02-15 NOTE — Patient Instructions (Signed)
Visit Information  Goals Addressed            This Visit's Progress   . Collaborate with RN Care Manager to perform approrpiate assessments to determine care management and care coordination needs       CARE PLAN ENTRY (see longitudinal plan of care for additional care plan information)  Current Barriers:  . Limited social support . New onset of symptoms which resulted in new referral to infectious disease . Chronic Conditions including DM II, HTN, and Depression  Social Work Clinical Goal(s):  [name] Over the next 10 days, patient will follow up with [name] as directed by SW Goal Met; unable to be seen until July 2021 . Over the next 20 days the patient will engage with RN Care Manager to develop an individualized plan of care related to the management of patients chronic conditions Goal Met; see individual goals established by RN Care Manager . Over the next 90 days the patient will work with embedded care management SW to address on going care coordination needs  CCM SW Interventions: Completed 02/15/20 . Successful outbound call placed to the patient to assess for goal progression . Determined the patient has yet to receive blood pressure monitor . Outbound call placed to [place] who reports no orders have been received . Collaboration with [name], FNP to request order be re-sent to [place] via fax to [phone] . Scheduled follow up call over the next two weeks to confirm equipment received   Patient Self Care Activities:  . Self administers medications as prescribed . Attends all scheduled provider appointments . Calls pharmacy for medication refills . Performs ADL's independently . Calls provider office for new concerns or questions  Please see past updates related to this goal by clicking on the "Past Updates" button in the selected goal         The care management team will reach out to the patient again over the next 14  days.   Daneen Schick, BSW, CDP Social Worker, Certified Dementia Practitioner [place] [facility] [place] [facility] [phone]

## 2020-02-17 ENCOUNTER — Other Ambulatory Visit: Payer: Self-pay | Admitting: Nurse Practitioner

## 2020-02-17 DIAGNOSIS — B9689 Other specified bacterial agents as the cause of diseases classified elsewhere: Secondary | ICD-10-CM

## 2020-02-17 DIAGNOSIS — B259 Cytomegaloviral disease, unspecified: Secondary | ICD-10-CM

## 2020-02-17 DIAGNOSIS — A599 Trichomoniasis, unspecified: Secondary | ICD-10-CM

## 2020-02-17 LAB — URINE CYTOLOGY ANCILLARY ONLY
Bacterial Vaginitis-Urine: POSITIVE — AB
Bacterial Vaginitis-Urine: POSITIVE — AB
Candida Urine: NEGATIVE
Chlamydia: NEGATIVE
Comment: NEGATIVE
Comment: NEGATIVE
Comment: NORMAL
Neisseria Gonorrhea: NEGATIVE
Trichomonas: POSITIVE — AB

## 2020-02-17 MED ORDER — TINIDAZOLE 500 MG PO TABS: ORAL_TABLET | ORAL | 0 refills | Status: DC

## 2020-02-17 NOTE — Progress Notes (Signed)
Referral made to infectious disease

## 2020-02-19 ENCOUNTER — Other Ambulatory Visit: Payer: Self-pay

## 2020-02-19 ENCOUNTER — Ambulatory Visit: Payer: Self-pay

## 2020-02-19 ENCOUNTER — Telehealth: Payer: Self-pay

## 2020-02-19 DIAGNOSIS — B259 Cytomegaloviral disease, unspecified: Secondary | ICD-10-CM

## 2020-02-19 DIAGNOSIS — I10 Essential (primary) hypertension: Secondary | ICD-10-CM

## 2020-02-19 DIAGNOSIS — F32A Depression, unspecified: Secondary | ICD-10-CM

## 2020-02-23 ENCOUNTER — Ambulatory Visit: Payer: Self-pay

## 2020-02-23 ENCOUNTER — Other Ambulatory Visit: Payer: Self-pay

## 2020-02-23 ENCOUNTER — Telehealth: Payer: Self-pay

## 2020-02-23 ENCOUNTER — Ambulatory Visit (INDEPENDENT_AMBULATORY_CARE_PROVIDER_SITE_OTHER): Payer: BC Managed Care – PPO | Admitting: Psychology

## 2020-02-23 DIAGNOSIS — I10 Essential (primary) hypertension: Secondary | ICD-10-CM

## 2020-02-23 DIAGNOSIS — A599 Trichomoniasis, unspecified: Secondary | ICD-10-CM

## 2020-02-23 DIAGNOSIS — F331 Major depressive disorder, recurrent, moderate: Secondary | ICD-10-CM | POA: Diagnosis not present

## 2020-02-23 DIAGNOSIS — E119 Type 2 diabetes mellitus without complications: Secondary | ICD-10-CM

## 2020-02-23 DIAGNOSIS — F32A Depression, unspecified: Secondary | ICD-10-CM

## 2020-02-23 DIAGNOSIS — B259 Cytomegaloviral disease, unspecified: Secondary | ICD-10-CM

## 2020-02-23 DIAGNOSIS — D509 Iron deficiency anemia, unspecified: Secondary | ICD-10-CM

## 2020-02-23 NOTE — Patient Instructions (Signed)
Visit Information  Goals Addressed      Patient Stated   .  "I am concerned about my test results" (pt-stated)        CARE PLAN ENTRY (see longitudinal plan of care for additional care plan information)  Current Barriers:  [name] Knowledge Deficits related to ongoing evaluation and treatment of abnormal lab results including positive CMV Ab - IgG . Chronic Disease Management support and education needs related to Depression, Essential HTN, Iron-deficiency Anemia, DMII  Nurse Case Manager Clinical Goal(s):  [name] Over the next 90 days, patient will work with the CCM team and PCP  to address needs related to evaluation and treatment of positive CMV Ab-IgG  CCM RN CM Interventions:  02/22/20 call completed with patient  . Inter-disciplinary care team collaboration (see longitudinal plan of care) . Evaluation of current treatment plan related to [place] CMV Ab-IgG and patient's adherence to plan as established by provider . Determined patient has a new patient appointment with [place] Psychologist scheduled for 02/23/20 for counseling and talk therapy  . Determined patient missed the call from the [facility] for Infectious Disease, she was unsure if she would pursue following up with this Specialist due to not being sure if it was needed . Education provided to patient related to the positive CMV results, provided rationale for purpose of the ID referral and importance to f/u with the MD as recommended by PCP . Provided patient with the name/contact number for [facility] for Infectious Disease and encouraged her to call for an appointment, patient is agreeable and plans to follow up with ID . Determined patient may need help with transportation for the ID appointment, discussed having the embedded BSW contact her to discuss options for transportation; Sent in basket message to [employer]  . Discussed plans with patient for ongoing care management follow up and provided patient with direct  contact information for care management team  Patient Self Care Activities:  . Patient verbalizes understanding of plan to f/u with PCP again in August for re-evaluation of abnormal labs . Self administers medications as prescribed . Attends all scheduled provider appointments . Calls pharmacy for medication refills . Calls provider office for new concerns or questions  Please see past updates related to this goal by clicking on the "Past Updates" button in the selected goal         Patient verbalizes understanding of instructions provided today.   Telephone follow up appointment with care management team member scheduled for: 03/28/20  Delsa Sale, RN, BSN, CCM Care Management Coordinator [facility] Care Management/[facility]  Direct Phone: [phone]

## 2020-02-23 NOTE — Chronic Care Management (AMB) (Signed)
  Chronic Care Management   Outreach Note  02/23/2020 Name: [name] MRN: [id number] DOB: 1956-05-21  Referred by: Arnette Felts, FNP Reason for referral : Care Coordination   Collaboration with RN Care Manager indicating patient needed assistance with transportation to an upcomming appointment. Unsuccessful outbound call placed to the patient. HIPAA compliant voice message left requesting a return call.  Follow Up Plan: The care management team will reach out to the patient again over the next 5 days.   Bevelyn Ngo, BSW, CDP Social Worker, Certified Dementia Practitioner [facility] Care Management [phone]

## 2020-02-23 NOTE — Chronic Care Management (AMB) (Signed)
Care Management   Follow Up Note   02/23/2020 Name: [name] MRN: [id number] DOB: Aug 04, 1956  Referred by: [name], FNP Reason for referral : Care Coordination   [name] is a 64 y.o. year old female who is a primary care patient of [name], [place]. The care management team was consulted for assistance with care management and care coordination needs.    Review of patient status, including review of consultants reports, relevant laboratory and other test results, and collaboration with appropriate care team members and the patient's provider was performed as part of comprehensive patient evaluation and provision of chronic care management services.    SDOH (Social Determinants of Health) assessments performed: Yes See Care Plan activities for detailed interventions related to SDOH)  SDOH Interventions     Most Recent Value  SDOH Interventions  Transportation Interventions Other (Comment)  [Referral to Connected Care]       Advanced Directives: See Care Plan and Vynca application for related entries.   Goals Addressed            This Visit's Progress   . Collaborate with RN Care Manager to perform approrpiate assessments to determine care management and care coordination needs       CARE PLAN ENTRY (see longitudinal plan of care for additional care plan information)  Current Barriers:  . Limited social support . New onset of symptoms which resulted in new referral to infectious disease . Chronic Conditions including DM II, HTN, and Depression  Social Work Clinical Goal(s):  [name] Over the next 10 days, patient will follow up with [name] as directed by SW Goal Met; unable to be seen until July 2021 . Over the next 20 days the patient will engage with RN Care Manager to develop an individualized plan of care related to the management of patients chronic conditions Goal Met; see individual goals established by RN Care  Manager . Over the next 90 days the patient will work with embedded care management SW to address on going care coordination needs . New 02/23/20 Over the next 5 days the patient will work with connected care team to identify transportation resources for [place] 6/22 appointment as directed by SW  CCM SW Interventions: Completed 02/23/20 . Collaboration with RN Care Manager, Barb Merino, who reports upcomming appointment on 6/22 without reliable transportation . Successful outbound call placed to the patient to discuss transportation needs o Patient previously connected with [employer] through [employer]- patient reports she is unable to secure transportation through this resource o Health plan benefits reviewed to note patient has [employer] which does not currently offer transportation benefits o Patient is not eligible for SCAT due to functional abilities o Discussed patient is on a fixed income and is unable to spend funds on a Lyft driver at this time . Advised patient SW would place a referral to Connected Care program to allow a care guide to outreach patient regarding transportation needs . Placed urgent referral to connected care for patient resource follow up   Patient Self Care Activities:  . Self administers medications as prescribed . Attends all scheduled provider appointments . Calls pharmacy for medication refills . Performs ADL's independently . Calls provider office for new concerns or questions  Please see past updates related to this goal by clicking on the "Past Updates" button in the selected goal          The care management team  will reach out to the patient again over the next 5 days.   Daneen Schick, BSW, CDP Social Worker, Certified Dementia Practitioner [place] [facility] [place] [facility] [phone]

## 2020-02-23 NOTE — Progress Notes (Signed)
This encounter was created in error - please disregard.

## 2020-02-23 NOTE — Patient Instructions (Signed)
Visit Information  Goals Addressed            This Visit's Progress   . Collaborate with RN Care Manager to perform approrpiate assessments to determine care management and care coordination needs       CARE PLAN ENTRY (see longitudinal plan of care for additional care plan information)  Current Barriers:  . Limited social support . New onset of symptoms which resulted in new referral to infectious disease . Chronic Conditions including DM II, HTN, and Depression  Social Work Clinical Goal(s):  [name] Over the next 10 days, patient will follow up with [name] as directed by SW Goal Met; unable to be seen until July 2021 . Over the next 20 days the patient will engage with RN Care Manager to develop an individualized plan of care related to the management of patients chronic conditions Goal Met; see individual goals established by RN Care Manager . Over the next 90 days the patient will work with embedded care management SW to address on going care coordination needs . New 02/23/20 Over the next 5 days the patient will work with connected care team to identify transportation resources for [place] 6/22 appointment as directed by SW  CCM SW Interventions: Completed 02/23/20 . Collaboration with RN Care Manager, Barb Merino, who reports upcomming appointment on 6/22 without reliable transportation . Successful outbound call placed to the patient to discuss transportation needs o Patient previously connected with [employer] through [employer]- patient reports she is unable to secure transportation through this resource o Health plan benefits reviewed to note patient has [employer] which does not currently offer transportation benefits o Patient is not eligible for SCAT due to functional abilities o Discussed patient is on a fixed income and is unable to spend funds on a Lyft driver at this time . Advised patient SW would place a  referral to Connected Care program to allow a care guide to outreach patient regarding transportation needs . Placed urgent referral to connected care for patient resource follow up   Patient Self Care Activities:  . Self administers medications as prescribed . Attends all scheduled provider appointments . Calls pharmacy for medication refills . Performs ADL's independently . Calls provider office for new concerns or questions  Please see past updates related to this goal by clicking on the "Past Updates" button in the selected goal         The care management team will reach out to the patient again over the next 5 days.   Daneen Schick, BSW, CDP Social Worker, Certified Dementia Practitioner [place] [facility] [place] [facility] [phone]

## 2020-02-23 NOTE — Chronic Care Management (AMB) (Signed)
Care Management   Follow Up Note   02/22/2020 Name: [name] MRN: [id number] DOB: 1955/09/25  Referred by: Arnette Felts, FNP Reason for referral : Care Coordination (FU RN CC Call )   [name] is a 64 y.o. year old female who is a primary care patient of Arnette Felts, FNP. The CCM team was consulted for assistance with chronic disease management and care coordination needs.    Review of patient status, including review of consultants reports, relevant laboratory and other test results, and collaboration with appropriate care team members and the patient's provider was performed as part of comprehensive patient evaluation and provision of chronic care management services.    SDOH (Social Determinants of Health) assessments performed: Yes - transportation  See Care Plan activities for detailed interventions related to SDOH)   Placed outbound call to patient for CC RN CM care plan update.   Objective:  Lab Results  Component Value Date   HGBA1C 5.5 01/12/2020   HGBA1C 7.2 (H) 11/18/2019   HGBA1C 7.0 (H) 07/28/2019   Lab Results  Component Value Date   MICROALBUR 150 01/12/2020   LDLCALC 80 11/18/2019   CREATININE 0.97 01/12/2020   BP Readings from Last 3 Encounters:  01/12/20 140/80  01/08/20 133/68  01/05/20 130/78     Outpatient Encounter Medications as of 02/19/2020  Medication Sig  . Accu-Chek Softclix Lancets lancets Use as instructed  . amLODipine (NORVASC) 5 MG tablet TAKE 1 TABLET (5 MG TOTAL) BY MOUTH DAILY.  [name] aspirin EC 325 MG EC tablet Take 1 tablet (325 mg total) by mouth daily. Take x 1 month post op to decrease risk of blood clots. (Patient taking differently: Take 325 mg by mouth daily. )  . Blood Glucose Monitoring Suppl (ACCU-CHEK AVIVA) device Use to check blood sugars daily E11.9  . Continuous Blood Gluc Receiver (FREESTYLE LIBRE READER) DEVI 1 each by Does not apply route 4 (four) times daily.  . Continuous Blood Gluc Sensor  (FREESTYLE LIBRE SENSOR SYSTEM) MISC 1 each by Does not apply route 4 (four) times daily.  . ferrous sulfate 325 (65 FE) MG tablet Take 1 tablet (325 mg total) by mouth 2 (two) times daily with a meal.  . fluconazole (DIFLUCAN) 100 MG tablet Take 1 tablet (100 mg total) by mouth daily. Take 1 tablet by mouth now repeat in 5 days  . folic acid (FOLVITE) 1 MG tablet Take 1 tablet (1 mg total) by mouth daily.  [name] glucose blood (ACCU-CHEK AVIVA PLUS) test strip Use as instructed  . Insulin Degludec-Liraglutide (XULTOPHY) 100-3.6 UNIT-MG/ML SOPN Inject 24 Units into the skin daily. (Patient taking differently: Inject 15 Units into the skin daily. )  . Insulin Pen Needle (BD PEN NEEDLE NANO U/F) 32G X 4 MM MISC USE FOR 1 INJECTION DAILY  . tinidazole (TINDAMAX) 500 MG tablet Take 2 tablets by mouth daily for 5 days  . valsartan (DIOVAN) 160 MG tablet TAKE ONE TABLET BY MOUTH ONCE DAILY (Patient taking differently: Take 160 mg by mouth daily. )   No facility-administered encounter medications on file as of 02/19/2020.     Objective:  Lab Results  Component Value Date   HGBA1C 5.5 01/12/2020   HGBA1C 7.2 (H) 11/18/2019   HGBA1C 7.0 (H) 07/28/2019   Lab Results  Component Value Date   MICROALBUR 150 01/12/2020   LDLCALC 80 11/18/2019   CREATININE 0.97 01/12/2020   BP Readings from Last 3 Encounters:  01/12/20 140/80  01/08/20 133/68  01/05/20 130/78    Goals Addressed      Patient Stated   .  "I am concerned about my test results" (pt-stated)        CARE PLAN ENTRY (see longitudinal plan of care for additional care plan information)  Current Barriers:  [name] Knowledge Deficits related to ongoing evaluation and treatment of abnormal lab results including positive CMV Ab - IgG . Chronic Disease Management support and education needs related to Depression, Essential HTN, Iron-deficiency Anemia, DMII  Nurse Case Manager Clinical Goal(s):  [name] Over the next 90 days, patient will work with the  CCM team and PCP  to address needs related to evaluation and treatment of positive CMV Ab-IgG  CCM RN CM Interventions:  02/22/20 call completed with patient  . Inter-disciplinary care team collaboration (see longitudinal plan of care) . Evaluation of current treatment plan related to [place] CMV Ab-IgG and patient's adherence to plan as established by provider . Determined patient has a new patient appointment with [facility] scheduled for 02/23/20 for counseling and talk therapy  . Determined patient missed the call from the [facility] for Infectious Disease, she was unsure if she would pursue following up with this Specialist due to not being sure if it was needed . Education provided to patient related to the positive CMV results, provided rationale for purpose of the ID referral and importance to f/u with the MD as recommended by PCP . Provided patient with the name/contact number for [facility] for Infectious Disease and encouraged her to call for an appointment, patient is agreeable and plans to follow up with ID . Discussed plans with patient for ongoing care management follow up and provided patient with direct contact information for care management team  Patient Self Care Activities:  . Patient verbalizes understanding of plan to f/u with PCP again in August for re-evaluation of abnormal labs . Self administers medications as prescribed . Attends all scheduled provider appointments . Calls pharmacy for medication refills . Calls provider office for new concerns or questions  Please see past updates related to this goal by clicking on the "Past Updates" button in the selected goal        Plan:   Telephone follow up appointment with care management team member scheduled for: 03/28/20  Barb Merino, RN, BSN, CCM  Care Management Coordinator [place] [facility]  Direct Phone: [phone]

## 2020-02-24 NOTE — Chronic Care Management (AMB) (Signed)
Chronic Care Management   Follow Up Note   02/23/2020 Name: [name] MRN: [id number] DOB: Aug 27, 1956  Referred by: [name], FNP Reason for referral : Care Coordination (FU RN CC Inbound Call from patient )   [name] is a 64 y.o. year old female who is a primary care patient of [name], [place]. The CCM team was consulted for assistance with chronic disease management and care coordination needs.    Review of patient status, including review of consultants reports, relevant laboratory and other test results, and collaboration with appropriate care team members and the patient's provider was performed as part of comprehensive patient evaluation and provision of chronic care management services.    SDOH (Social Determinants of Health) assessments performed: Yes See Care Plan activities for detailed interventions related to [place])   Inbound call from patient to confirm she scheduled a new patient appointment with Infectious disease, Dr. Scharlene Gloss, MD.     Outpatient Encounter Medications as of 02/23/2020  Medication Sig   Accu-Chek Softclix Lancets lancets Use as instructed   amLODipine (NORVASC) 5 MG tablet TAKE 1 TABLET (5 MG TOTAL) BY MOUTH DAILY.   aspirin EC 325 MG EC tablet Take 1 tablet (325 mg total) by mouth daily. Take x 1 month post op to decrease risk of blood clots. (Patient taking differently: Take 325 mg by mouth daily. )   Blood Glucose Monitoring Suppl (ACCU-CHEK AVIVA) device Use to check blood sugars daily E11.9   Continuous Blood Gluc Receiver (FREESTYLE LIBRE READER) DEVI 1 each by Does not apply route 4 (four) times daily.   Continuous Blood Gluc Sensor (FREESTYLE LIBRE SENSOR SYSTEM) MISC 1 each by Does not apply route 4 (four) times daily.   ferrous sulfate 325 (65 FE) MG tablet Take 1 tablet (325 mg total) by mouth 2 (two) times daily with a meal.   fluconazole (DIFLUCAN) 100 MG tablet Take 1 tablet (100 mg total) by mouth daily.  Take 1 tablet by mouth now repeat in 5 days   folic acid (FOLVITE) 1 MG tablet Take 1 tablet (1 mg total) by mouth daily.   glucose blood (ACCU-CHEK AVIVA PLUS) test strip Use as instructed   Insulin Degludec-Liraglutide (XULTOPHY) 100-3.6 UNIT-MG/ML SOPN Inject 24 Units into the skin daily. (Patient taking differently: Inject 15 Units into the skin daily. )   Insulin Pen Needle (BD PEN NEEDLE NANO U/F) 32G X 4 MM MISC USE FOR 1 INJECTION DAILY   tinidazole (TINDAMAX) 500 MG tablet Take 2 tablets by mouth daily for 5 days   valsartan (DIOVAN) 160 MG tablet TAKE ONE TABLET BY MOUTH ONCE DAILY (Patient taking differently: Take 160 mg by mouth daily. )   No facility-administered encounter medications on file as of 02/23/2020.     Objective:  Lab Results  Component Value Date   HGBA1C 5.5 01/12/2020   HGBA1C 7.2 (H) 11/18/2019   HGBA1C 7.0 (H) 07/28/2019   Lab Results  Component Value Date   MICROALBUR 150 01/12/2020   LDLCALC 80 11/18/2019   CREATININE 0.97 01/12/2020   BP Readings from Last 3 Encounters:  01/12/20 140/80  01/08/20 133/68  01/05/20 130/78    Goals Addressed      Patient Stated     "I am concerned about my test results" (pt-stated)        CARE PLAN ENTRY (see longitudinal plan of care for additional care plan information)  Current Barriers:   Knowledge Deficits related to ongoing evaluation and  treatment of abnormal lab results including positive CMV Ab - IgG  Chronic Disease Management support and education needs related to Depression, Essential HTN, Iron-deficiency Anemia, DMII  Nurse Case Manager Clinical Goal(s):   Over the next 90 days, patient will work with the CCM team and PCP  to address needs related to evaluation and treatment of positive CMV Ab-IgG  CCM RN CM Interventions:  02/23/20 call completed with patient   Inter-disciplinary care team collaboration (see longitudinal plan of care)  Evaluation of current treatment plan related to  [place] CMV Ab-IgG and patient's adherence to plan as established by provider  Determined patient completed her new patient appointment with [facility], she spoke with Dr. Forde Radon and found this telephonic session to be very effective  Determined patient scheduled an appointment with Infectious disease, Dr. Luciana Axe MD scheduled for 03/01/20 @10 :00 AM   Sent in basket message to embedded BSW making her aware of the patient's new patient visit scheduled with ID for 03/01/20  Discussed plans with patient for ongoing care management follow up and provided patient with direct contact information for care management team  Patient Self Care Activities:   Patient verbalizes understanding of plan to f/u with PCP again in August for re-evaluation of abnormal labs  Self administers medications as prescribed  Attends all scheduled provider appointments  Calls pharmacy for medication refills  Calls provider office for new concerns or questions  Please see past updates related to this goal by clicking on the "Past Updates" button in the selected goal        Plan:   Telephone follow up appointment with care management team member scheduled for: 03/28/20  03/30/20, RN, BSN, CCM Care Management Coordinator [facility] Care Management/[facility]  Direct Phone: [phone]

## 2020-02-24 NOTE — Patient Instructions (Addendum)
Visit Information  Goals Addressed      Patient Stated   .  "I am concerned about my test results" (pt-stated)        CARE PLAN ENTRY (see longitudinal plan of care for additional care plan information)  Current Barriers:  [name] Knowledge Deficits related to ongoing evaluation and treatment of abnormal lab results including positive CMV Ab - IgG . Chronic Disease Management support and education needs related to Depression, Essential HTN, Iron-deficiency Anemia, DMII  Nurse Case Manager Clinical Goal(s):  [name] Over the next 90 days, patient will work with the CCM team and PCP  to address needs related to evaluation and treatment of positive CMV Ab-IgG  CCM RN CM Interventions:  02/23/20 call completed with patient  . Inter-disciplinary care team collaboration (see longitudinal plan of care) . Evaluation of current treatment plan related to [place] CMV Ab-IgG and patient's adherence to plan as established by provider . Determined patient completed her new patient appointment with [facility], she spoke with Dr. Forde Radon and found this telephonic session to be very effective . Determined patient scheduled an appointment with Infectious disease, Dr. Luciana Axe MD scheduled for 03/01/20 @10 :00 AM  . in basket message to embedded BSW Rhina Brackett making her aware of the patient's new patient visit scheduled with ID for 03/01/20 . Discussed plans with patient for ongoing care management follow up and provided patient with direct contact information for care management team  Patient Self Care Activities:  . Patient verbalizes understanding of plan to f/u with PCP again in August for re-evaluation of abnormal labs . Self administers medications as prescribed . Attends all scheduled provider appointments . Calls pharmacy for medication refills . Calls provider office for new concerns or questions  Please see past updates related to this goal by clicking on the "Past Updates" button in  the selected goal        Patient verbalizes understanding of instructions provided today.   Telephone follow up appointment with care management team member scheduled for: 03/28/20  03/30/20, RN, BSN, CCM Care Management Coordinator [facility] Care Management/[facility]  Direct Phone: [phone]

## 2020-02-25 ENCOUNTER — Telehealth: Payer: Self-pay

## 2020-02-25 ENCOUNTER — Ambulatory Visit: Payer: Self-pay

## 2020-02-25 ENCOUNTER — Other Ambulatory Visit: Payer: Self-pay

## 2020-02-25 DIAGNOSIS — I10 Essential (primary) hypertension: Secondary | ICD-10-CM

## 2020-02-25 DIAGNOSIS — E119 Type 2 diabetes mellitus without complications: Secondary | ICD-10-CM

## 2020-02-25 DIAGNOSIS — F32A Depression, unspecified: Secondary | ICD-10-CM

## 2020-02-25 DIAGNOSIS — D509 Iron deficiency anemia, unspecified: Secondary | ICD-10-CM

## 2020-02-25 DIAGNOSIS — B259 Cytomegaloviral disease, unspecified: Secondary | ICD-10-CM

## 2020-02-25 NOTE — Chronic Care Management (AMB) (Signed)
  Chronic Care Management   Outreach Note  02/25/2020 Name: [name] MRN: [id number] DOB: Oct 21, 1955  Referred by: Arnette Felts, FNP Reason for referral : Care Coordination   SW placed an unsuccessful outbound call to the patient to assist with the completion of a SCAT application. SW left a HIPAA compliant voice message requesting a return call.  Follow Up Plan: SW will outreach the patient over the next 14 days.  Bevelyn Ngo, BSW, CDP Social Worker, Certified Dementia Practitioner [facility] Care Management [phone]

## 2020-02-25 NOTE — Telephone Encounter (Signed)
02/25/2020 Spoke with patient informed her that transportation to her 6/22 appointment was scheduled with [facility] and they would call her with details.  Emailed request to [employer] for 6/22 appointment received confirmation from .Sigurd Sos with [employer]. Per Misti Sellars Janece Moore's CMA will work on getting the patient set up with [employer] for transportation. Bevelyn Ngo has an appointment  to speak with the patient and will initiate Part A of the SCAT application.  Olean Ree [phone]

## 2020-02-26 ENCOUNTER — Telehealth: Payer: Self-pay | Admitting: Nurse Practitioner

## 2020-02-26 NOTE — Telephone Encounter (Deleted)
   [name] DOB: 06/22/1956 MRN: [id number]   RIDER WAIVER AND RELEASE OF LIABILITY  For purposes of improving physical access to our facilities, [facility] is pleased to partner with third parties to provide [name] patients or other authorized individuals the option of convenient, on-demand ground transportation services (the "Transport Services") through use of the technology service that enables users to request on-demand ground transportation from independent third-party providers.  By opting to use and accept these Transport Services, I, the undersigned, hereby agree on behalf of myself, and on behalf of any minor child using the Transport Services for whom I am the parent or legal guardian, as follows:  1. Transport Services provided to me are provided by independent third-party transportation providers who are not [facility] agents or employees and who are unaffiliated with [facility]. 2. [facility] is neither a transportation carrier nor a common or public carrier. 3. [facility] has no control over the quality or safety of the transportation that occurs as a result of the Transport Services. 4. [facility] cannot guarantee that any third-party transportation provider will complete any arranged transportation service. 5. [facility] makes no representation, warranty, or guarantee regarding the reliability, timeliness, quality, safety, suitability, or availability of any of the Transport Services or that they will be error free. 6. I fully understand that traveling by vehicle involves risks and dangers of serious bodily injury, including permanent disability, paralysis, and death. I agree, on behalf of myself and on behalf of any minor child using the Transport Services for whom I am the parent or legal guardian, that the entire risk arising out of my use of the Transport Services remains solely with me, to the maximum extent permitted under applicable law. 7. The Transport  Services are provided "as is" and "as available." [facility] disclaims all representations and warranties, express, implied or statutory, not expressly set out in these terms, including the implied warranties of merchantability and fitness for a particular purpose. 8. I hereby waive and release [place], its agents, employees, officers, directors, representatives, insurers, attorneys, assigns, successors, subsidiaries, and affiliates from any and all past, present, or future claims, demands, liabilities, actions, causes of action, or suits of any kind directly or indirectly arising from acceptance and use of the Transport Services. 9. I further waive and release [place] and its affiliates from all present and future liability and responsibility for any injury or death to persons or damages to property caused by or related to the use of the Transport Services. 10. I have read this Waiver and Release of Liability, and I understand the terms used in it and their legal significance. This Waiver is freely and voluntarily given with the understanding that my right (as well as the right of any minor child for whom I am the parent or legal guardian using the Transport Services) to legal recourse against  in connection with the Transport Services is knowingly surrendered in return for use of these services.   I attest that I read the consent document to [name], gave Ms. [name] the opportunity to ask questions and answered the questions asked (if any). I affirm that [name] then provided consent for she's participation in this program.     [name]                           

## 2020-02-26 NOTE — Chronic Care Management (AMB) (Signed)
Care Management   Follow Up Note   02/25/2020 Name: [name] MRN: [id number] DOB: 11-21-1955  Referred by: [name], FNP Reason for referral : Care Coordination (FU RN CC Inbound Call from patient )   [name] is a 64 y.o. year old female who is a primary care patient of [name], [place]. The CCM team was consulted for assistance with chronic disease management and care coordination needs.    Review of patient status, including review of consultants reports, relevant laboratory and other test results, and collaboration with appropriate care team members and the patient's provider was performed as part of comprehensive patient evaluation and provision of chronic care management services.    SDOH (Social Determinants of Health) assessments performed: Yes - financial strain  See Care Plan activities for detailed interventions related to [facility])   Completed an inbound call with patient to discuss her upcoming MD visits and difficulty paying her copay's.     Outpatient Encounter Medications as of 02/25/2020  Medication Sig  . Accu-Chek Softclix Lancets lancets Use as instructed  . amLODipine (NORVASC) 5 MG tablet TAKE 1 TABLET (5 MG TOTAL) BY MOUTH DAILY.  [name] aspirin EC 325 MG EC tablet Take 1 tablet (325 mg total) by mouth daily. Take x 1 month post op to decrease risk of blood clots. (Patient taking differently: Take 325 mg by mouth daily. )  . Blood Glucose Monitoring Suppl (ACCU-CHEK AVIVA) device Use to check blood sugars daily E11.9  . Continuous Blood Gluc Receiver (FREESTYLE LIBRE READER) DEVI 1 each by Does not apply route 4 (four) times daily.  . Continuous Blood Gluc Sensor (FREESTYLE LIBRE SENSOR SYSTEM) MISC 1 each by Does not apply route 4 (four) times daily.  . ferrous sulfate 325 (65 FE) MG tablet Take 1 tablet (325 mg total) by mouth 2 (two) times daily with a meal.  . fluconazole (DIFLUCAN) 100 MG tablet Take 1 tablet (100 mg total) by mouth daily.  Take 1 tablet by mouth now repeat in 5 days  . folic acid (FOLVITE) 1 MG tablet Take 1 tablet (1 mg total) by mouth daily.  [name] glucose blood (ACCU-CHEK AVIVA PLUS) test strip Use as instructed  . Insulin Degludec-Liraglutide (XULTOPHY) 100-3.6 UNIT-MG/ML SOPN Inject 24 Units into the skin daily. (Patient taking differently: Inject 15 Units into the skin daily. )  . Insulin Pen Needle (BD PEN NEEDLE NANO U/F) 32G X 4 MM MISC USE FOR 1 INJECTION DAILY  . tinidazole (TINDAMAX) 500 MG tablet Take 2 tablets by mouth daily for 5 days  . valsartan (DIOVAN) 160 MG tablet TAKE ONE TABLET BY MOUTH ONCE DAILY (Patient taking differently: Take 160 mg by mouth daily. )   No facility-administered encounter medications on file as of 02/25/2020.    Objective:  Lab Results  Component Value Date   HGBA1C 5.5 01/12/2020   HGBA1C 7.2 (H) 11/18/2019   HGBA1C 7.0 (H) 07/28/2019   Lab Results  Component Value Date   MICROALBUR 150 01/12/2020   LDLCALC 80 11/18/2019   CREATININE 0.97 01/12/2020   BP Readings from Last 3 Encounters:  01/12/20 140/80  01/08/20 133/68  01/05/20 130/78    Goals Addressed      Patient Stated   .  "I am concerned about my test results" (pt-stated)        CARE PLAN ENTRY (see longitudinal plan of care for additional care plan information)  Current Barriers:  [name] Knowledge Deficits related to ongoing evaluation and  treatment of abnormal lab results including positive CMV Ab - IgG . Chronic Disease Management support and education needs related to Depression, Essential HTN, Iron-deficiency Anemia, DMII  Nurse Case Manager Clinical Goal(s):  [name] Over the next 90 days, patient will work with the CCM team and PCP  to address needs related to evaluation and treatment of positive CMV Ab-IgG  CCM RN CM Interventions:  02/23/20 call completed with patient  . Inter-disciplinary care team collaboration (see longitudinal plan of care) . Evaluation of current treatment plan related to  [place] CMV Ab-IgG and patient's adherence to plan as established by provider . Determined patient completed her new patient appointment with Corinda Gubler Psychologist, she spoke with Dr. Forde Radon and found this telephonic session to be very effective . Determined patient scheduled an appointment with Infectious disease, Dr. Luciana Axe MD scheduled for 03/01/20 @10 :00 AM  . in basket message to embedded BSW Rhina Brackett making her aware of the patient's new patient visit scheduled with ID for 03/01/20 . Discussed plans with patient for ongoing care management follow up and provided patient with direct contact information for care management team 02/25/20 Inbound call received from patient  . Determined patient may not be able to afford her copayment for her Infectious disease appointment scheduled for 03/01/20 . Informed patient, resources are unfortunately not available to help cover the cost of copayment's . Discussed having patient contact the Specialist to ask to be billed for the copay so that she can keep the appointment for next week and or the patient may reschedule for a day that she will be able to afford the copayment . Reinforced the purpose of the referral to ID and the importance of trying to keep this appointment if able for further evaluation and treatment if needed . Determined patient will contact the ID office and discuss having them bill her for the copayment   Patient Self Care Activities:  . Patient verbalizes understanding of plan to f/u with PCP again in August for re-evaluation of abnormal labs . Self administers medications as prescribed . Attends all scheduled provider appointments . Calls pharmacy for medication refills . Calls provider office for new concerns or questions  Please see past updates related to this goal by clicking on the "Past Updates" button in the selected goal      .  "to improve my vision" (pt-stated)        CARE PLAN ENTRY (see longitudinal plan  of care for additional care plan information)  Current Barriers:  September Knowledge Deficits related to disease process and Self Health management for Retinopathy . Chronic Disease Management support and education needs related to DMII, Essential Hypertension, Depression unspecified, Iron Deficiency Anemia, Cytomegalovirus unspecified type   Nurse Case Manager Clinical Goal(s):  [name] Over the next 30 days, patient will work with Retinal Specialist to address needs related to evaluate and treat Retinopathy  . Over the next 90 days, patient will work with the CCM team and PCP to address needs related to disease education and support to increase knowledge of disease process and Self Health management of Retinopathy  CCM RN CM Interventions:  02/25/20 call completed with patient  . Inter-disciplinary care team collaboration (see longitudinal plan of care) . Evaluation of current treatment plan related to Retinopathy and patient's adherence to plan as established by provider. . Provided education to patient re: disease process of Retinopathy and how to Self improve per recommendations of PCP and Retinal Specialist, and keeping DM well controlled  .  Reviewed medications with patient and discussed patient is not currently prescribed ophthalmic therapy for treatment of her Retinopathy . Determined patient will follow up with Dr. Alan Mulder, Retinal Specialist scheduled next week on 03/04/20 @8 :45 AM   Sent in basket message to embedded BSW with an update concerning patient's financial strain and stated concerns she may not be able to afford her Specialist copay for next weeks appointment . Discussed plans with patient for ongoing care management follow up and provided patient with direct contact information for care management team . Provided patient with printed educational materials related to Diabetic Retinopathy  Patient Self Care Activities:  . Self administers medications as  prescribed . Attends all scheduled provider appointments . Calls pharmacy for medication refills . Performs ADL's independently . Performs IADL's independently . Calls provider office for new concerns or questions  Initial goal documentation        Plan:   Telephone follow up appointment with care management team member scheduled for: 03/28/20  03/30/20, RN, BSN, CCM Care Management Coordinator [facility] Care Management/[facility]  Direct Phone: [phone]

## 2020-02-26 NOTE — Patient Instructions (Signed)
Visit Information  Goals Addressed      Patient Stated   .  "I am concerned about my test results" (pt-stated)        CARE PLAN ENTRY (see longitudinal plan of care for additional care plan information)  Current Barriers:  [name] Knowledge Deficits related to ongoing evaluation and treatment of abnormal lab results including positive CMV Ab - IgG . Chronic Disease Management support and education needs related to Depression, Essential HTN, Iron-deficiency Anemia, DMII  Nurse Case Manager Clinical Goal(s):  [name] Over the next 90 days, patient will work with the CCM team and PCP  to address needs related to evaluation and treatment of positive CMV Ab-IgG  CCM RN CM Interventions:  02/23/20 call completed with patient  . Inter-disciplinary care team collaboration (see longitudinal plan of care) . Evaluation of current treatment plan related to [place] CMV Ab-IgG and patient's adherence to plan as established by provider . Determined patient completed her new patient appointment with Velora Heckler Psychologist, she spoke with Dr. [name] and found this telephonic session to be very effective . Determined patient scheduled an appointment with Infectious disease, Dr. [name] scheduled for 03/01/20 @10 :00 AM  . Durene Cal in basket message to embedded BSW Daneen Schick making her aware of the patient's new patient visit scheduled with ID for 03/01/20 . Discussed plans with patient for ongoing care management follow up and provided patient with direct contact information for care management team 02/25/20 Inbound call received from patient  . Determined patient may not be able to afford her copayment for her Infectious disease appointment scheduled for 03/01/20 . Informed patient, resources are unfortunately not available to help cover the cost of copayment's . Discussed having patient contact the Specialist to ask to be billed for the copay so that she can keep the appointment for next week and or the patient may  reschedule for a day that she will be able to afford the copayment . Reinforced the purpose of the referral to ID and the importance of trying to keep this appointment if able for further evaluation and treatment if needed . Determined patient will contact the ID office and discuss having them bill her for the copayment   Patient Self Care Activities:  . Patient verbalizes understanding of plan to f/u with PCP again in August for re-evaluation of abnormal labs . Self administers medications as prescribed . Attends all scheduled provider appointments . Calls pharmacy for medication refills . Calls provider office for new concerns or questions  Please see past updates related to this goal by clicking on the "Past Updates" button in the selected goal      .  "to improve my vision" (pt-stated)        [place] (see longitudinal plan of care for additional care plan information)  Current Barriers:  [name] Knowledge Deficits related to disease process and Self Health management for Retinopathy . Chronic Disease Management support and education needs related to DMII, Essential Hypertension, Depression unspecified, Iron Deficiency Anemia, Cytomegalovirus unspecified type   Nurse Case Manager Clinical Goal(s):  [name] Over the next 30 days, patient will work with Retinal Specialist to address needs related to evaluate and treat Retinopathy  . Over the next 90 days, patient will work with the CCM team and PCP to address needs related to disease education and support to increase knowledge of disease process and Self Health management of Retinopathy  CCM RN CM Interventions:  02/25/20 call completed with patient  .  Inter-disciplinary care team collaboration (see longitudinal plan of care) . Evaluation of current treatment plan related to Retinopathy and patient's adherence to plan as established by provider. . Provided education to patient re: disease process of Retinopathy and how to Self improve per  recommendations of PCP and Retinal Specialist, and keeping DM well controlled  . Reviewed medications with patient and discussed patient is not currently prescribed ophthalmic therapy for treatment of her Retinopathy . Determined patient will follow up with Dr. Alan Mulder, Retinal Specialist scheduled next week on 03/04/20 @8 :45 AM  . Sent in basket message to embedded BSW with an update concerning patient's financial strain and stated concerns she may not be able to afford her Specialist copay for next weeks appointment  . Discussed plans with patient for ongoing care management follow up and provided patient with direct contact information for care management team . Provided patient with printed educational materials related to Diabetic Retinopathy  Patient Self Care Activities:  . Self administers medications as prescribed . Attends all scheduled provider appointments . Calls pharmacy for medication refills . Performs ADL's independently . Performs IADL's independently . Calls provider office for new concerns or questions  Initial goal documentation        Patient verbalizes understanding of instructions provided today.   Telephone follow up appointment with care management team member scheduled for: 03/28/20  03/30/20, RN, BSN, CCM Care Management Coordinator [facility] Care Management/[facility]  Direct Phone: [phone]

## 2020-02-26 NOTE — Telephone Encounter (Signed)
   MAJESTIC MOLONY DOB: [birth year] MRN: [id number]   RIDER WAIVER AND RELEASE OF LIABILITY  For purposes of improving physical access to our facilities, [facility] is pleased to partner with third parties to provide [facility] patients or other authorized individuals the option of convenient, on-demand ground transportation services (the Chirop[facility]") through use of the technology service that enables users to request on-demand ground transportation from independent third-party providers.  By opting to use and accept these [employer], I, the undersigned, hereby agree on behalf of myself, and on behalf of any minor child using the [employer] for whom I am the parent or legal guardian, as follows:  1. [employer] provided to me are provided by independent third-party transportation providers who are not [employer] or employees and who are unaffiliated with [employer]. 2. [place] is neither a transportation carrier nor a common or public carrier. 3. [facility] has no control over the quality or safety of the transportation that occurs as a result of the [employer]. 4. [facility] cannot guarantee that any third-party transportation provider will complete any arranged transportation service. 5. [facility] makes no representation, warranty, or guarantee regarding the reliability, timeliness, quality, safety, suitability, or availability of any of the Transport Services or that they will be error free. 6. I fully understand that traveling by vehicle involves risks and dangers of serious bodily injury, including permanent disability, paralysis, and death. I agree, on behalf of myself and on behalf of any minor child using the Transport Services for whom I am the parent or legal guardian, that the entire risk arising out of my use of the [employer] remains solely with me, to the maximum extent permitted under applicable law. 7. The [employer] are provided "as is" and "as available." [facility] disclaims all representations and warranties, express, implied or statutory, not expressly set out in these terms, including the implied warranties of merchantability and fitness for a particular purpose. 8. I hereby waive and release [facility], its agents, employees, officers, directors, representatives, insurers, attorneys, assigns, successors, subsidiaries, and affiliates from any and all past, present, or future claims, demands, liabilities, actions, causes of action, or suits of any kind directly or indirectly arising from acceptance and use of the [employer]. 9. I further waive and release [facility] and its affiliates from all present and future liability and responsibility for any injury or death to persons or damages to property caused by or related to the use of the [employer]. 10. I have read this Waiver and Release of Liability, and I understand the terms used in it and their legal significance. This Waiver is freely and voluntarily given with the understanding that my right (as well as the right of any minor child for whom I am the parent or legal guardian using the [employer]) to legal recourse against [place] in connection with the [employer] is knowingly surrendered in return for use of these services.   I attest that I read the consent document to Lucianne Lei, gave Ms. Bara-Hart the opportunity to ask questions and answered the questions asked (if any). I affirm that DOLCE SYLVIA then provided consent for she's participation in this program.     Hessie Knows

## 2020-02-26 NOTE — Telephone Encounter (Signed)
Consent

## 2020-02-29 ENCOUNTER — Ambulatory Visit: Payer: Self-pay

## 2020-02-29 ENCOUNTER — Telehealth: Payer: Self-pay

## 2020-02-29 DIAGNOSIS — I10 Essential (primary) hypertension: Secondary | ICD-10-CM

## 2020-02-29 DIAGNOSIS — E119 Type 2 diabetes mellitus without complications: Secondary | ICD-10-CM

## 2020-02-29 DIAGNOSIS — F32A Depression, unspecified: Secondary | ICD-10-CM

## 2020-02-29 NOTE — Telephone Encounter (Signed)
Copied from CRM [id number]. Topic: Referral - Status >> Feb 29, 2020 11:40 AM Ricarda Frame D wrote: 02/29/20 Spoke with patient she has received a call from [facility] transportation that they will pick her up around 9:15 am on 03/01/20 and she has signed ride waiver. No other resources needed closing referral. Olean Ree [phone]

## 2020-03-01 ENCOUNTER — Other Ambulatory Visit: Payer: Self-pay

## 2020-03-01 ENCOUNTER — Encounter: Payer: Self-pay | Admitting: Internal Medicine

## 2020-03-01 ENCOUNTER — Ambulatory Visit (INDEPENDENT_AMBULATORY_CARE_PROVIDER_SITE_OTHER): Payer: BC Managed Care – PPO | Admitting: Internal Medicine

## 2020-03-01 ENCOUNTER — Ambulatory Visit: Payer: Self-pay

## 2020-03-01 DIAGNOSIS — R894 Abnormal immunological findings in specimens from other organs, systems and tissues: Secondary | ICD-10-CM | POA: Diagnosis not present

## 2020-03-01 DIAGNOSIS — E119 Type 2 diabetes mellitus without complications: Secondary | ICD-10-CM

## 2020-03-01 DIAGNOSIS — I10 Essential (primary) hypertension: Secondary | ICD-10-CM

## 2020-03-01 DIAGNOSIS — R768 Other specified abnormal immunological findings in serum: Secondary | ICD-10-CM

## 2020-03-01 NOTE — Chronic Care Management (AMB) (Signed)
Care Management   Follow Up Note   03/01/2020 Name: [name] MRN: [id number] DOB: 10-May-1956  Referred by: [name], FNP Reason for referral : Care Coordination   [name] is a 64 y.o. year old female who is a primary care patient of [name], [place]. The care management team was consulted for assistance with care management and care coordination needs.    Review of patient status, including review of consultants reports, relevant laboratory and other test results, and collaboration with appropriate care team members and the patient's provider was performed as part of comprehensive patient evaluation and provision of chronic care management services.    SDOH (Social Determinants of Health) assessments performed: No See Care Plan activities for detailed interventions related to [facility])     Advanced Directives: See Care Plan and Vynca application for related entries.   Goals Addressed            This Visit's Progress   . Collaborate with RN Care Manager to perform approrpiate assessments to determine care management and care coordination needs   On track    [place] (see longitudinal plan of care for additional care plan information)  Current Barriers:  . Limited social support . New onset of symptoms which resulted in new referral to infectious disease . Chronic Conditions including DM II, HTN, and Depression  Social Work Clinical Goal(s):  [name] Over the next 10 days, patient will follow up with [name] as directed by SW Goal Met; unable to be seen until July 2021 . Over the next 20 days the patient will engage with RN Care Manager to develop an individualized plan of care related to the management of patients chronic conditions Goal Met; see individual goals established by RN Care Manager . Over the next 90 days the patient will work with embedded care management SW to address on going care coordination needs . New  02/23/20 Over the next 5 days the patient will work with connected care team to identify transportation resources for [place] 6/22 appointment as directed by SW Goal Met  CCM SW Interventions: Completed 02/29/20 . Collaboration with Connected Care team member whom confirms securing transportation to [place] appointment on 6/22 o Informed patient has been contacted by [facility] transportation team to confirm pick up time . Successful outbound call placed to the patient to review transportation arrangements o Patient is able to verbalize knowledge of mode of transportation and pick up time . Determined the patient has contacted ID ahead of appointment time to discuss co-pay amount and barriers to paying during time of visit o Patient reports she will be billed after her appointment as she does not have the funds to cover her so-pay amount on 6/22 . Discussed patient upcomming eye appointment scheduled for Friday 6/25 and patients needs for transportation assistance . Provided education surrounding the [place] transportation service indicating this was provided for acute needs and may not be approved to assist with eye appointment . Reviewed alternative options for transportation: o SCAT: SW introduced SCAT program to the patient. SW advised the patient SW has collaborated with PCP to determine if patient may qualify. Both PCP and patient do not feel patient is appropriate for [place]: Patient reports she has never accessed a city bus for transportation and is not interested at this time. SW discussed cost effectiveness of resource- patient declined o Family/Friends: Patient often relies on her niece and/or nephew to provide  transportation if they are available. The patient reports her nephew is unavailable to assist on 6/25 but she will check with her niece if needed o Lyft: Patient has used Lyft often in the past when she has the funds . Advised patient SW would follow up  with primary providers office manager, Darla Lesches, to inquire if 6/25 transportation would be accommodated . Encouraged the patient to reach out to her niece in the meantime to discuss transportation assistance as well . SW to follow up with the patient over the next 2 days   Patient Self Care Activities:  . Self administers medications as prescribed . Attends all scheduled provider appointments . Calls pharmacy for medication refills . Performs ADL's independently . Calls provider office for new concerns or questions  Please see past updates related to this goal by clicking on the "Past Updates" button in the selected goal          The care management team will reach out to the patient again over the next 2 days.   Daneen Schick, BSW, CDP Social Worker, Certified Dementia Practitioner [place] [facility] [place] Management [phone]

## 2020-03-01 NOTE — Patient Instructions (Signed)
Visit Information  Goals Addressed            This Visit's Progress   . Collaborate with RN Care Manager to perform approrpiate assessments to determine care management and care coordination needs   On track    [name] (see longitudinal plan of care for additional care plan information)  Current Barriers:  . Limited social support . New onset of symptoms which resulted in new referral to infectious disease . Chronic Conditions including DM II, HTN, and Depression  Social Work Clinical Goal(s):  [name] Over the next 10 days, patient will follow up with [name] as directed by SW Goal Met; unable to be seen until July 2021 . Over the next 20 days the patient will engage with RN Care Manager to develop an individualized plan of care related to the management of patients chronic conditions Goal Met; see individual goals established by RN Care Manager . Over the next 90 days the patient will work with embedded care management SW to address on going care coordination needs . New 02/23/20 Over the next 5 days the patient will work with connected care team to identify transportation resources for [name] 6/22 appointment as directed by SW Goal Met  CCM SW Interventions: Completed 02/29/20 . Collaboration with Connected Care team member whom confirms securing transportation to [place] appointment on 6/22 o Informed patient has been contacted by [facility] transportation team to confirm pick up time . Successful outbound call placed to the patient to review transportation arrangements o Patient is able to verbalize knowledge of mode of transportation and pick up time . Determined the patient has contacted ID ahead of appointment time to discuss co-pay amount and barriers to paying during time of visit o Patient reports she will be billed after her appointment as she does not have the funds to cover her so-pay amount on 6/22 . Discussed patient upcomming eye  appointment scheduled for Friday 6/25 and patients needs for transportation assistance . Provided education surrounding the [place] transportation service indicating this was provided for acute needs and may not be approved to assist with eye appointment . Reviewed alternative options for transportation: o SCAT: SW introduced SCAT program to the patient. SW advised the patient SW has collaborated with PCP to determine if patient may qualify. Both PCP and patient do not feel patient is appropriate for [place]: Patient reports she has never accessed a city bus for transportation and is not interested at this time. SW discussed cost effectiveness of resource- patient declined o Family/Friends: Patient often relies on her niece and/or nephew to provide transportation if they are available. The patient reports her nephew is unavailable to assist on 6/25 but she will check with her niece if needed o Lyft: Patient has used Lyft often in the past when she has the funds . Advised patient SW would follow up with primary providers office manager, Darla Lesches, to inquire if 6/25 transportation would be accommodated . Encouraged the patient to reach out to her niece in the meantime to discuss transportation assistance as well . SW to follow up with the patient over the next 2 days   Patient Self Care Activities:  . Self administers medications as prescribed . Attends all scheduled provider appointments . Calls pharmacy for medication refills . Performs ADL's independently . Calls provider office for new concerns or questions  Please see past updates related to this goal by clicking on the "Past  Updates" button in the selected goal         The care management team will reach out to the patient again over the next 2 days.   Daneen Schick, BSW, CDP Social Worker, Certified Dementia Practitioner [place] [facility] [place] Management [phone]

## 2020-03-01 NOTE — Chronic Care Management (AMB) (Signed)
Care Management   Follow Up Note   03/01/2020 Name: [name] MRN: [id number] DOB: 04/11/1956  Referred by: [name], FNP Reason for referral : Care Coordination   [name] is a 64 y.o. year old female who is a primary care patient of [name], [place]. The care management team was consulted for assistance with care management and care coordination needs.    Review of patient status, including review of consultants reports, relevant laboratory and other test results, and collaboration with appropriate care team members and the patient's provider was performed as part of comprehensive patient evaluation and provision of chronic care management services.    SDOH (Social Determinants of Health) assessments performed: No See Care Plan activities for detailed interventions related to [facility])     Advanced Directives: See Care Plan and Vynca application for related entries.   Goals Addressed              This Visit's Progress     Patient Stated   .  "I am concerned about my test results" (pt-stated)        CARE PLAN ENTRY (see longitudinal plan of care for additional care plan information)  Current Barriers:  [name] Knowledge Deficits related to ongoing evaluation and treatment of abnormal lab results including positive CMV Ab - IgG . Chronic Disease Management support and education needs related to Depression, Essential HTN, Iron-deficiency Anemia, DMII  Nurse Case Manager Clinical Goal(s):  [name] Over the next 90 days, patient will work with the CCM team and PCP  to address needs related to evaluation and treatment of positive CMV Ab-IgG  CCM SW Interventions: Completed 03/01/20 . Inbound call received from the patient who reports her ID visit went well and the physician informed her she "had nothing to worry about" o No follow up with ID planned at this time . Collaboration with RN Care Manager to inform of appointment outcome  Patient Self Care Activities:   . Patient verbalizes understanding of plan to f/u with PCP again in August for re-evaluation of abnormal labs . Self administers medications as prescribed . Attends all scheduled provider appointments . Calls pharmacy for medication refills . Calls provider office for new concerns or questions  Please see past updates related to this goal by clicking on the "Past Updates" button in the selected goal        Other   .  COMPLETED: Collaborate with RN Care Manager to perform approrpiate assessments to determine care management and care coordination needs        CARE PLAN ENTRY (see longitudinal plan of care for additional care plan information)  Current Barriers:  . Limited social support . New onset of symptoms which resulted in new referral to infectious disease . Chronic Conditions including DM II, HTN, and Depression  Social Work Clinical Goal(s):  [name] Over the next 10 days, patient will follow up with [name] as directed by SW Goal Met; unable to be seen until July 2021 . Over the next 20 days the patient will engage with RN Care Manager to develop an individualized plan of care related to the management of patients chronic conditions Goal Met; see individual goals established by RN Care Manager . Over the next 90 days the patient will work with embedded care management SW to address on going care coordination needs . New 02/23/20 Over the next 5 days the patient will work with connected care team to identify transportation resources for  [place]comm[place] 6/22 appointment as directed by SW Goal Met  CCM SW Interventions: Completed 03/01/20 . Communication with the patient regarding outcome of collaboration with [employer] . Informed the patient SW was unable to [place] transportation to [place] appointment for Friday 6/25 . Patient reported she has been coordinating with support system and no longer needs assistance . SW encouraged patient to contact  SW as needed . No further SW follow up planned at this time  Patient Self Care Activities:  . Self administers medications as prescribed . Attends all scheduled provider appointments . Calls pharmacy for medication refills . Performs ADL's independently . Calls provider office for new concerns or questions  Please see past updates related to this goal by clicking on the "Past Updates" button in the selected goal          No SW follow up planned at this time. Patient will remain active with RN Care Manager.  Daneen Schick, BSW, CDP Social Worker, Certified Dementia Practitioner [place] / [place] Management [phone]

## 2020-03-01 NOTE — Patient Instructions (Signed)
Visit Information  Goals Addressed              This Visit's Progress     Patient Stated   .  "I am concerned about my test results" (pt-stated)        CARE PLAN ENTRY (see longitudinal plan of care for additional care plan information)  Current Barriers:  [name] Knowledge Deficits related to ongoing evaluation and treatment of abnormal lab results including positive CMV Ab - IgG . Chronic Disease Management support and education needs related to Depression, Essential HTN, Iron-deficiency Anemia, DMII  Nurse Case Manager Clinical Goal(s):  [name] Over the next 90 days, patient will work with the CCM team and PCP  to address needs related to evaluation and treatment of positive CMV Ab-IgG  CCM SW Interventions: Completed 03/01/20 . Inbound call received from the patient who reports her ID visit went well and the physician informed her she "had nothing to worry about" o No follow up with ID planned at this time . Collaboration with RN Care Manager to inform of appointment outcome  Patient Self Care Activities:  . Patient verbalizes understanding of plan to f/u with PCP again in August for re-evaluation of abnormal labs . Self administers medications as prescribed . Attends all scheduled provider appointments . Calls pharmacy for medication refills . Calls provider office for new concerns or questions  Please see past updates related to this goal by clicking on the "Past Updates" button in the selected goal        Other   .  COMPLETED: Collaborate with RN Care Manager to perform approrpiate assessments to determine care management and care coordination needs        CARE PLAN ENTRY (see longitudinal plan of care for additional care plan information)  Current Barriers:  . Limited social support . New onset of symptoms which resulted in new referral to infectious disease . Chronic Conditions including DM II, HTN, and Depression  Social Work Clinical Goal(s):  [name] Over the next 10 days,  patient will follow up with [name] as directed by SW Goal Met; unable to be seen until July 2021 . Over the next 20 days the patient will engage with RN Care Manager to develop an individualized plan of care related to the management of patients chronic conditions Goal Met; see individual goals established by RN Care Manager . Over the next 90 days the patient will work with embedded care management SW to address on going care coordination needs . New 02/23/20 Over the next 5 days the patient will work with connected care team to identify transportation resources for [place] 6/22 appointment as directed by SW Goal Met  CCM SW Interventions: Completed 03/01/20 . Communication with the patient regarding outcome of collaboration with [employer] . Informed the patient SW was unable to [place] transportation to [place] appointment for Friday 6/25 . Patient reported she has been coordinating with support system and no longer needs assistance . SW encouraged patient to contact SW as needed . No further SW follow up planned at this time  Patient Self Care Activities:  . Self administers medications as prescribed . Attends all scheduled provider appointments . Calls pharmacy for medication refills . Performs ADL's independently . Calls provider office for new concerns or questions  Please see past updates related to this goal by clicking on the "Past Updates" button in the selected goal        No SW follow  up planned at this time. Please contact me as needed.  Daneen Schick, BSW, CDP Social Worker, Certified Dementia Practitioner [place] / [place] Management [phone]

## 2020-03-02 ENCOUNTER — Encounter: Payer: Self-pay | Admitting: Internal Medicine

## 2020-03-02 DIAGNOSIS — R768 Other specified abnormal immunological findings in serum: Secondary | ICD-10-CM | POA: Insufficient documentation

## 2020-03-02 NOTE — Progress Notes (Signed)
[facility]      Reason for Consult: history of CMV infection     Referring Physician: Enrigue Catena, FNP    Patient ID: [name], female    DOB: [birth year], 64 y.o.   MRN: [id number]  HPI:   Here for evaluation of a positive serology for CMV.  She was seen by her primary care clinic recently with fatigue and had noted a significantly low Hgb requiring transfusion.  At that time, she had testing for CMV serology done, though nothing is listed as to why it was checked.  The patient is unaware of why it was checked either.  She was having no fever, no sore throat, no cervical lymphadenopathy.  No sick contacts.  Serology was IgM negative and IgG positive.  She was then sent here for evaluation.  LFTs wnl.    Past Medical History:  Diagnosis Date  . Anemia 01/07/2020   REQUIRING TRANSFUSION  . Back pain   . Diabetes mellitus without complication (HCC)   . GERD (gastroesophageal reflux disease)   . Hypertension   . Olecranon bursitis of left elbow     Prior to Admission medications   Medication Sig Start Date End Date Taking? Authorizing Provider  Accu-Chek Softclix Lancets lancets Use as instructed 01/26/20  Yes Arnette Felts, FNP  amLODipine (NORVASC) 5 MG tablet TAKE 1 TABLET (5 MG TOTAL) BY MOUTH DAILY. 12/12/19  Yes Arnette Felts, FNP  aspirin EC 325 MG EC tablet Take 1 tablet (325 mg total) by mouth daily. Take x 1 month post op to decrease risk of blood clots. Patient taking differently: Take 325 mg by mouth daily.  06/10/16  Yes Marshia Ly, PA-C  Blood Glucose Monitoring Suppl (ACCU-CHEK AVIVA) device Use to check blood sugars daily E11.9 01/26/20 01/25/21 Yes Arnette Felts, FNP  Continuous Blood Gluc Receiver (FREESTYLE LIBRE READER) DEVI 1 each by Does not apply route 4 (four) times daily. 05/06/19  Yes Arnette Felts, FNP  Continuous Blood Gluc Sensor (FREESTYLE LIBRE SENSOR SYSTEM) MISC 1 each by Does not apply route 4 (four) times daily. 05/06/19   Yes Arnette Felts, FNP  ferrous sulfate 325 (65 FE) MG tablet Take 1 tablet (325 mg total) by mouth 2 (two) times daily with a meal. 01/08/20 03/08/20 Yes Pahwani, Daleen Bo, MD  folic acid (FOLVITE) 1 MG tablet Take 1 tablet (1 mg total) by mouth daily. 01/08/20 04/07/20 Yes Pahwani, Daleen Bo, MD  glucose blood (ACCU-CHEK AVIVA PLUS) test strip Use as instructed 01/26/20  Yes Arnette Felts, FNP  Insulin Degludec-Liraglutide (XULTOPHY) 100-3.6 UNIT-MG/ML SOPN Inject 24 Units into the skin daily. Patient taking differently: Inject 15 Units into the skin daily.  11/22/19  Yes Arnette Felts, FNP  Insulin Pen Needle (BD PEN NEEDLE NANO U/F) 32G X 4 MM MISC USE FOR 1 INJECTION DAILY 06/10/19  Yes Dorothyann Peng, MD  valsartan (DIOVAN) 160 MG tablet TAKE ONE TABLET BY MOUTH ONCE DAILY Patient taking differently: Take 160 mg by mouth daily.  12/12/19  Yes Arnette Felts, FNP  fluconazole (DIFLUCAN) 100 MG tablet Take 1 tablet (100 mg total) by mouth daily. Take 1 tablet by mouth now repeat in 5 days 01/20/20   Arnette Felts, FNP  tinidazole [facility]) 500 MG tablet Take 2 tablets by mouth daily for 5 days 02/17/20   Arnette Felts, FNP    No Known Allergies  Social History   Tobacco Use  . Smoking status: Current Every Day Smoker    Packs/day:  1.00    Years: 30.00    Pack years: 30.00    Types: Cigarettes  . Smokeless tobacco: Never Used  . Tobacco comment: needs a patch  Vaping Use  . Vaping Use: Never used  Substance Use Topics  . Alcohol use: No    Comment: opccasionally   . Drug use: No    Family History  Problem Relation Age of Onset  . Diabetes Mother   . Hypertension Mother   . Diabetes Father   . Hypertension Father     Review of Systems  Constitutional: negative for fevers and chills Respiratory: negative for cough or sputum Hematologic/lymphatic: negative for lymphadenopathy All other systems reviewed and are negative    Constitutional: in no apparent distress  Vitals:   03/01/20 0933    BP: (!) 204/84  Pulse: 65  Temp: 98.6 F (37 C)   EYES: anicteric Cardiovascular: Cor RRR Respiratory: clear Musculoskeletal: no pedal edema noted Skin: negatives: no rash Neuro: non-focal  Labs: Lab Results  Component Value Date   WBC 15.7 (H) 01/12/2020   HGB 10.2 (L) 01/12/2020   HCT 33.2 (L) 01/12/2020   MCV 76 (L) 01/12/2020   PLT 328 01/12/2020    Lab Results  Component Value Date   CREATININE 0.97 01/12/2020   BUN 21 01/12/2020   NA 146 (H) 01/12/2020   K 4.2 01/12/2020   CL 109 (H) 01/12/2020   CO2 22 01/12/2020    Lab Results  Component Value Date   ALT 15 01/12/2020   AST 20 01/12/2020   ALKPHOS 93 01/12/2020   BILITOT 0.3 01/12/2020   INR 1.01 06/10/2016     Assessment: Remote CMV infection.  I discussed the types of symptoms that occur with CMV including fever, cervical lymphadenopathy and modes of transmission.  I discussed the difference between IgM (recent infection) and IgG (more remote infection).  I also discussed that Ab are typically protective from getting it again.   Patient was relieved after the discussion.   Plan: 1) no further follow up indicated

## 2020-03-03 ENCOUNTER — Telehealth: Payer: Self-pay

## 2020-03-04 ENCOUNTER — Encounter (INDEPENDENT_AMBULATORY_CARE_PROVIDER_SITE_OTHER): Payer: BC Managed Care – PPO | Admitting: Ophthalmology

## 2020-03-04 ENCOUNTER — Other Ambulatory Visit: Payer: Self-pay

## 2020-03-04 DIAGNOSIS — I10 Essential (primary) hypertension: Secondary | ICD-10-CM

## 2020-03-04 DIAGNOSIS — E113412 Type 2 diabetes mellitus with severe nonproliferative diabetic retinopathy with macular edema, left eye: Secondary | ICD-10-CM | POA: Diagnosis not present

## 2020-03-04 DIAGNOSIS — E11311 Type 2 diabetes mellitus with unspecified diabetic retinopathy with macular edema: Secondary | ICD-10-CM | POA: Diagnosis not present

## 2020-03-04 DIAGNOSIS — H34812 Central retinal vein occlusion, left eye, with macular edema: Secondary | ICD-10-CM

## 2020-03-04 DIAGNOSIS — H43813 Vitreous degeneration, bilateral: Secondary | ICD-10-CM

## 2020-03-04 DIAGNOSIS — E113311 Type 2 diabetes mellitus with moderate nonproliferative diabetic retinopathy with macular edema, right eye: Secondary | ICD-10-CM | POA: Diagnosis not present

## 2020-03-04 DIAGNOSIS — H35033 Hypertensive retinopathy, bilateral: Secondary | ICD-10-CM

## 2020-03-09 ENCOUNTER — Ambulatory Visit (INDEPENDENT_AMBULATORY_CARE_PROVIDER_SITE_OTHER): Payer: BC Managed Care – PPO | Admitting: Psychology

## 2020-03-09 DIAGNOSIS — F331 Major depressive disorder, recurrent, moderate: Secondary | ICD-10-CM

## 2020-03-15 ENCOUNTER — Other Ambulatory Visit: Payer: Self-pay | Admitting: Nurse Practitioner

## 2020-03-15 DIAGNOSIS — E559 Vitamin D deficiency, unspecified: Secondary | ICD-10-CM

## 2020-03-17 ENCOUNTER — Ambulatory Visit: Payer: Self-pay

## 2020-03-17 NOTE — Chronic Care Management (AMB) (Signed)
  Care Management   Outreach Note  03/17/2020 Name: [name] MRN: [id number] DOB: September 05, 1956  Referred by: Arnette Felts, FNP Reason for referral : Care Coordination   SW received inbound call from the patient who reports she may not be able to pay her cell phone bill for the next several days. Patient reported to reach her on her home phone if needed over the next 1-2 weeks.    Bevelyn Ngo, BSW, CDP Social Worker, Certified Dementia Practitioner [facility] Care Management [phone]

## 2020-03-28 ENCOUNTER — Telehealth: Payer: BC Managed Care – PPO

## 2020-03-28 ENCOUNTER — Ambulatory Visit: Payer: Self-pay

## 2020-03-28 ENCOUNTER — Other Ambulatory Visit: Payer: Self-pay

## 2020-03-28 DIAGNOSIS — I10 Essential (primary) hypertension: Secondary | ICD-10-CM

## 2020-03-28 DIAGNOSIS — F32A Depression, unspecified: Secondary | ICD-10-CM

## 2020-03-28 DIAGNOSIS — B259 Cytomegaloviral disease, unspecified: Secondary | ICD-10-CM

## 2020-03-28 DIAGNOSIS — D509 Iron deficiency anemia, unspecified: Secondary | ICD-10-CM

## 2020-03-28 DIAGNOSIS — E119 Type 2 diabetes mellitus without complications: Secondary | ICD-10-CM

## 2020-03-29 NOTE — Chronic Care Management (AMB) (Addendum)
  Care Management   Outreach Note  03/29/2020 Name: [name] MRN: [id number] DOB: [birth year]  Referred by: Arnette Felts, FNP Reason for referral : Care Coordination (FU RN CC Call - depression/ID)   An unsuccessful telephone outreach was attempted today. The patient was referred to the case management team for assistance with care management and care coordination.   Follow Up Plan: A HIPPA compliant phone message was left for the patient providing contact information and requesting a return call.  Telephone follow up appointment with care management team member scheduled for: 05/10/20  Delsa Sale, RN, BSN, CCM Care Management Coordinator [facility] Care Management/[facility]  Direct Phone: [phone]

## 2020-03-30 ENCOUNTER — Ambulatory Visit: Payer: Self-pay

## 2020-03-30 DIAGNOSIS — E119 Type 2 diabetes mellitus without complications: Secondary | ICD-10-CM

## 2020-03-30 DIAGNOSIS — D509 Iron deficiency anemia, unspecified: Secondary | ICD-10-CM

## 2020-03-30 DIAGNOSIS — F32A Depression, unspecified: Secondary | ICD-10-CM

## 2020-03-30 DIAGNOSIS — B259 Cytomegaloviral disease, unspecified: Secondary | ICD-10-CM

## 2020-03-30 DIAGNOSIS — I10 Essential (primary) hypertension: Secondary | ICD-10-CM

## 2020-03-30 DIAGNOSIS — H35033 Hypertensive retinopathy, bilateral: Secondary | ICD-10-CM

## 2020-03-31 ENCOUNTER — Ambulatory Visit (INDEPENDENT_AMBULATORY_CARE_PROVIDER_SITE_OTHER): Payer: BC Managed Care – PPO | Admitting: Psychology

## 2020-03-31 DIAGNOSIS — F331 Major depressive disorder, recurrent, moderate: Secondary | ICD-10-CM | POA: Diagnosis not present

## 2020-03-31 NOTE — Chronic Care Management (AMB) (Addendum)
Care Management   Follow Up Note   03/30/2020 Name: [name] MRN: [id number] DOB: Feb 20, 1956  Referred by: [name], FNP Reason for referral : Care Coordination (FU RN CM inbound call )   [name] is a 64 y.o. year old female who is a primary care patient of [name], [place]. The CCM team was consulted for assistance with chronic disease management and care coordination needs.    Review of patient status, including review of consultants reports, relevant laboratory and other test results, and collaboration with appropriate care team members and the patient's provider was performed as part of comprehensive patient evaluation and provision of chronic care management services.    SDOH (Social Determinants of Health) assessments performed: Yes - no acute challenges See Care Plan activities for detailed interventions related to [place])   Placed outbound CC RN CM follow up call to patient to assess for CCM needs.     Outpatient Encounter Medications as of 03/30/2020  Medication Sig  . Accu-Chek Softclix Lancets lancets Use as instructed  . amLODipine (NORVASC) 5 MG tablet TAKE 1 TABLET (5 MG TOTAL) BY MOUTH DAILY.  [name] aspirin EC 325 MG EC tablet Take 1 tablet (325 mg total) by mouth daily. Take x 1 month post op to decrease risk of blood clots. (Patient taking differently: Take 325 mg by mouth daily. )  . Blood Glucose Monitoring Suppl (ACCU-CHEK AVIVA) device Use to check blood sugars daily E11.9  . Continuous Blood Gluc Receiver (FREESTYLE LIBRE READER) DEVI 1 each by Does not apply route 4 (four) times daily.  . Continuous Blood Gluc Sensor (FREESTYLE LIBRE SENSOR SYSTEM) MISC 1 each by Does not apply route 4 (four) times daily.  . ferrous sulfate 325 (65 FE) MG tablet Take 1 tablet (325 mg total) by mouth 2 (two) times daily with a meal.  . folic acid (FOLVITE) 1 MG tablet Take 1 tablet (1 mg total) by mouth daily.  [name] glucose blood (ACCU-CHEK AVIVA PLUS) test strip  Use as instructed  . Insulin Degludec-Liraglutide (XULTOPHY) 100-3.6 UNIT-MG/ML SOPN Inject 24 Units into the skin daily. (Patient taking differently: Inject 15 Units into the skin daily. )  . Insulin Pen Needle (BD PEN NEEDLE NANO U/F) 32G X 4 MM MISC USE FOR 1 INJECTION DAILY  . valsartan (DIOVAN) 160 MG tablet TAKE ONE TABLET BY MOUTH ONCE DAILY (Patient taking differently: Take 160 mg by mouth daily. )  . Vitamin D, Ergocalciferol, (DRISDOL) 1.25 MG (50000 UNIT) CAPS capsule TAKE 1 CAPSULE (50,000 UNITS TOTAL) BY MOUTH EVERY 7 (SEVEN) DAYS.   No facility-administered encounter medications on file as of 03/30/2020.     Objective:  Lab Results  Component Value Date   HGBA1C 5.5 01/12/2020   HGBA1C 7.2 (H) 11/18/2019   HGBA1C 7.0 (H) 07/28/2019   Lab Results  Component Value Date   MICROALBUR 150 01/12/2020   LDLCALC 80 11/18/2019   CREATININE 0.97 01/12/2020   BP Readings from Last 3 Encounters:  03/01/20 (!) 204/84  01/12/20 140/80  01/08/20 133/68    Goals Addressed      Patient Stated   .  "I am concerned about my test results" (pt-stated)   On track     [place] (see longitudinal plan of care for additional care plan information)  Current Barriers:  [name] Knowledge Deficits related to ongoing evaluation and treatment of abnormal lab results including positive CMV Ab - IgG . Chronic Disease Management support and education needs  related to Depression, Essential HTN, Iron-deficiency Anemia, DMII  Nurse Case Manager Clinical Goal(s):  [name] Over the next 90 days, patient will work with the CCM team and PCP  to address needs related to evaluation and treatment of positive CMV Ab-IgG  CCM RN CM Interventions:  03/30/20 call completed with patient  . Inter-disciplinary care team collaboration (see longitudinal plan of care) . Evaluation of current treatment plan related to Postiv[place] CMV Ab-IgG and patient's adherence to plan as established by provider . Determined patient  continues to f/u with Dr. [name] for talk therapy and is finding this therapy to be very effective for her depression  . Determined she completed her follow up visit with Infectious disease, Dr. [name] and was advised no further follow up is required at this time . Discussed patient feels her depression has improved, she is happier and is coping more effectively with her circumstances . Discussed her next PCP f/u is scheduled for 04/13/20 _0 :15 PM  . Discussed plans with patient for ongoing care management follow up and provided patient with direct contact information for care management team  Patient Self Care Activities:  . Patient verbalizes understanding of plan to f/u with PCP again in August for re-evaluation of abnormal labs . Self administers medications as prescribed . Attends all scheduled provider appointments . Calls pharmacy for medication refills . Calls provider office for new concerns or questions  Please see past updates related to this goal by clicking on the "Past Updates" button in the selected goal      .  "to improve my vision" (pt-stated)   On track     [place] (see longitudinal plan of care for additional care plan information)  Current Barriers:  [name] Knowledge Deficits related to disease process and Self Health management for Retinopathy . Chronic Disease Management support and education needs related to DMII, Essential Hypertension, Depression unspecified, Iron Deficiency Anemia, Cytomegalovirus unspecified type   Nurse Case Manager Clinical Goal(s):  [name] Over the next 30 days, patient will work with Retinal Specialist to address needs related to evaluate and treat Retinopathy  Goal Met  . Over the next 90 days, patient will work with the CCM team and PCP to address needs related to disease education and support to increase knowledge of disease process and Self Health management of Retinopathy  CCM RN CM Interventions:  07/21//21 call completed with  patient  . Inter-disciplinary care team collaboration (see longitudinal plan of care) . Evaluation of current treatment plan related to Retinopathy and patient's adherence to plan as established by provider. . Reinforced education to patient re: disease process of Retinopathy and how to Self improve per recommendations of PCP and Retinal Specialist, and keeping DM and HTN well controlled  . Determined patient completed her follow up with Dr. [name], Retinal Specialist as scheduled  . Discussed plans with patient for ongoing care management follow up and provided patient with direct contact information for care management team  Patient Self Care Activities:  . Self administers medications as prescribed . Attends all scheduled provider appointments . Calls pharmacy for medication refills . Performs ADL's independently . Performs IADL's independently . Calls provider office for new concerns or questions  Initial goal documentation     .  "to keep my BP well controlled" (pt-stated)   Not on track     [place] (see longitudinal plan of care for additional care plan information)  Current Barriers:  [name] Knowledge Deficits related  to disease process and Self Health management of HTN . Chronic Disease Management support and education needs related to Depression, Essential Hypertension, Iron deficiency Anemia, DMII  Nurse Case Manager Clinical Goal(s):  [name] Over the next 90 days, patient will work with the CCM team and PCP to address needs related to disease education and support to improve Self Health management of HTN  CCM RN CM Interventions:  03/29/20 call completed with patient  . Inter-disciplinary care team collaboration (see longitudinal plan of care) . Evaluation of current treatment plan related to HTN and patient's adherence to plan as established by provider. . Provided education to patient re: target BP < 130/80; Determined patient does not have a BP cuff at home but  does spot check her BP when visiting her local pharmacy; Educated on importance of consistent BP control for best chance to prevent Cardiovascular events and or renal failure . Encouraged patient to contact her health plan to inquire about benefits for a BP cuff, pt agreed . Reviewed medications with patient and discussed indication, dosage and frequency of prescribed antihypertensive medications, patient reports adherence . Discussed plans with patient for ongoing care management follow up and provided patient with direct contact information for care management team  Patient Self Care Activities:  . Self administers medications as prescribed . Attends all scheduled provider appointments . Calls pharmacy for medication refills . Calls provider office for new concerns or questions  Please see past updates related to this goal by clicking on the "Past Updates" button in the selected goal        Plan:   Telephone follow up appointment with care management team member scheduled for: 05/10/20  Barb Merino, RN, BSN, CCM Care Management Coordinator [place] [facility]  Direct Phone: [phone]

## 2020-03-31 NOTE — Patient Instructions (Addendum)
Visit Information  Goals Addressed      Patient Stated   .  "I am concerned about my test results" (pt-stated)   On track     [place] (see longitudinal plan of care for additional care plan information)  Current Barriers:  [name] Knowledge Deficits related to ongoing evaluation and treatment of abnormal lab results including positive CMV Ab - IgG . Chronic Disease Management support and education needs related to Depression, Essential HTN, Iron-deficiency Anemia, DMII  Nurse Case Manager Clinical Goal(s):  [name] Over the next 90 days, patient will work with the CCM team and PCP  to address needs related to evaluation and treatment of positive CMV Ab-IgG  CCM RN CM Interventions:  03/30/20 call completed with patient  . Inter-disciplinary care team collaboration (see longitudinal plan of care) . Evaluation of current treatment plan related to Post[place] CMV Ab-IgG and patient's adherence to plan as established by provider . Determined patient continues to f/u with Dr. [name] for talk therapy and is finding this therapy to be very effective for her depression  . Determined she completed her follow up visit with Infectious disease, Dr. [name] and was advised no further follow up is required at this time . Discussed patient feels her depression has improved, she is happier and is coping more effectively with her circumstances . Discussed her next PCP f/u is scheduled for 04/13/20 _0 :15 PM  . Discussed plans with patient for ongoing care management follow up and provided patient with direct contact information for care management team  Patient Self Care Activities:  . Patient verbalizes understanding of plan to f/u with PCP again in August for re-evaluation of abnormal labs . Self administers medications as prescribed . Attends all scheduled provider appointments . Calls pharmacy for medication refills . Calls provider office for new concerns or questions  Please see past updates  related to this goal by clicking on the "Past Updates" button in the selected goal      .  "to improve my vision" (pt-stated)   On track     [place] (see longitudinal plan of care for additional care plan information)  Current Barriers:  [name] Knowledge Deficits related to disease process and Self Health management for Retinopathy . Chronic Disease Management support and education needs related to DMII, Essential Hypertension, Depression unspecified, Iron Deficiency Anemia, Cytomegalovirus unspecified type   Nurse Case Manager Clinical Goal(s):  [name] Over the next 30 days, patient will work with Retinal Specialist to address needs related to evaluate and treat Retinopathy  Goal Met  . Over the next 90 days, patient will work with the CCM team and PCP to address needs related to disease education and support to increase knowledge of disease process and Self Health management of Retinopathy  CCM RN CM Interventions:  07/21//21 call completed with patient  . Inter-disciplinary care team collaboration (see longitudinal plan of care) . Evaluation of current treatment plan related to Retinopathy and patient's adherence to plan as established by provider. . Reinforced education to patient re: disease process of Retinopathy and how to Self improve per recommendations of PCP and Retinal Specialist, and keeping DM and HTN well controlled  . Determined patient completed her follow up with Dr. [name], Retinal Specialist as scheduled  . Discussed plans with patient for ongoing care management follow up and provided patient with direct contact information for care management team  Patient Self Care Activities:  . Self administers medications as prescribed .  Attends all scheduled provider appointments . Calls pharmacy for medication refills . Performs ADL's independently . Performs IADL's independently . Calls provider office for new concerns or questions  Initial goal documentation     .   "to keep my BP well controlled" (pt-stated)   Not on track     [place] (see longitudinal plan of care for additional care plan information)  Current Barriers:  [name] Knowledge Deficits related to disease process and Self Health management of HTN . Chronic Disease Management support and education needs related to Depression, Essential Hypertension, Iron deficiency Anemia, DMII  Nurse Case Manager Clinical Goal(s):  [name] Over the next 90 days, patient will work with the CCM team and PCP to address needs related to disease education and support to improve Self Health management of HTN  CCM RN CM Interventions:  03/29/20 call completed with patient  . Inter-disciplinary care team collaboration (see longitudinal plan of care) . Evaluation of current treatment plan related to HTN and patient's adherence to plan as established by provider. . Provided education to patient re: target BP < 130/80; Determined patient does not have a BP cuff at home but does spot check her BP when visiting her local pharmacy; Educated on importance of consistent BP control for best chance to prevent Cardiovascular events and or renal failure . Encouraged patient to contact her health plan to inquire about benefits for a BP cuff, pt agreed . Reviewed medications with patient and discussed indication, dosage and frequency of prescribed antihypertensive medications, patient reports adherence . Discussed plans with patient for ongoing care management follow up and provided patient with direct contact information for care management team  Patient Self Care Activities:  . Self administers medications as prescribed . Attends all scheduled provider appointments . Calls pharmacy for medication refills . Calls provider office for new concerns or questions  Please see past updates related to this goal by clicking on the "Past Updates" button in the selected goal        Patient verbalizes understanding of instructions  provided today.   Telephone follow up appointment with care management team member scheduled for: 05/10/20  Barb Merino, RN, BSN, CCM Care Management Coordinator [place] [facility]  Direct Phone: [phone]

## 2020-04-01 ENCOUNTER — Encounter (INDEPENDENT_AMBULATORY_CARE_PROVIDER_SITE_OTHER): Payer: BC Managed Care – PPO | Admitting: Ophthalmology

## 2020-04-01 ENCOUNTER — Other Ambulatory Visit: Payer: Self-pay

## 2020-04-01 DIAGNOSIS — H34812 Central retinal vein occlusion, left eye, with macular edema: Secondary | ICD-10-CM | POA: Diagnosis not present

## 2020-04-01 DIAGNOSIS — H35033 Hypertensive retinopathy, bilateral: Secondary | ICD-10-CM

## 2020-04-01 DIAGNOSIS — E113311 Type 2 diabetes mellitus with moderate nonproliferative diabetic retinopathy with macular edema, right eye: Secondary | ICD-10-CM | POA: Diagnosis not present

## 2020-04-01 DIAGNOSIS — E11311 Type 2 diabetes mellitus with unspecified diabetic retinopathy with macular edema: Secondary | ICD-10-CM | POA: Diagnosis not present

## 2020-04-01 DIAGNOSIS — H43813 Vitreous degeneration, bilateral: Secondary | ICD-10-CM

## 2020-04-01 DIAGNOSIS — E113512 Type 2 diabetes mellitus with proliferative diabetic retinopathy with macular edema, left eye: Secondary | ICD-10-CM | POA: Diagnosis not present

## 2020-04-01 DIAGNOSIS — I10 Essential (primary) hypertension: Secondary | ICD-10-CM

## 2020-04-13 ENCOUNTER — Encounter: Payer: Self-pay | Admitting: Nurse Practitioner

## 2020-04-13 ENCOUNTER — Other Ambulatory Visit: Payer: Self-pay

## 2020-04-13 ENCOUNTER — Ambulatory Visit (INDEPENDENT_AMBULATORY_CARE_PROVIDER_SITE_OTHER): Payer: BC Managed Care – PPO | Admitting: Nurse Practitioner

## 2020-04-13 VITALS — BP 140/72 | HR 64 | Temp 98.4°F | Ht 60.6 in | Wt 204.8 lb

## 2020-04-13 DIAGNOSIS — I10 Essential (primary) hypertension: Secondary | ICD-10-CM | POA: Diagnosis not present

## 2020-04-13 DIAGNOSIS — F329 Major depressive disorder, single episode, unspecified: Secondary | ICD-10-CM

## 2020-04-13 DIAGNOSIS — E119 Type 2 diabetes mellitus without complications: Secondary | ICD-10-CM

## 2020-04-13 DIAGNOSIS — D528 Other folate deficiency anemias: Secondary | ICD-10-CM | POA: Diagnosis not present

## 2020-04-13 DIAGNOSIS — H35033 Hypertensive retinopathy, bilateral: Secondary | ICD-10-CM | POA: Diagnosis not present

## 2020-04-13 DIAGNOSIS — Z8619 Personal history of other infectious and parasitic diseases: Secondary | ICD-10-CM

## 2020-04-13 DIAGNOSIS — F32A Depression, unspecified: Secondary | ICD-10-CM

## 2020-04-13 DIAGNOSIS — Z1211 Encounter for screening for malignant neoplasm of colon: Secondary | ICD-10-CM

## 2020-04-13 NOTE — Progress Notes (Signed)
This visit occurred during the SARS-CoV-2 public health emergency.  Safety protocols were in place, including screening questions prior to the visit, additional usage of staff PPE, and extensive cleaning of exam room while observing appropriate contact time as indicated for disinfecting solutions.  Subjective:     Patient ID: [name] , female    DOB: 02-May-1956 , 64 y.o.   MRN: [id number]   Chief Complaint  Patient presents with  . Hypertension    HPI  She has been seeing a therapist [name] 3-4 times, next time will be tomorrow in the morning.    Diabetes She presents for her follow-up diabetic visit. She has type 2 diabetes mellitus. Her disease course has been worsening. There are no hypoglycemic associated symptoms. Pertinent negatives for hypoglycemia include no dizziness or headaches. Pertinent negatives for diabetes include no blurred vision, no chest pain, no fatigue, no polydipsia, no polyphagia and no polyuria. There are no hypoglycemic complications. Symptoms are improving. There are no diabetic complications. Risk factors for coronary artery disease include sedentary lifestyle and obesity. Current diabetic treatment includes oral agent (dual therapy) (20 units daily). She is compliant with treatment most of the time. Her weight is stable. She is following a generally healthy diet. When asked about meal planning, she reported none. She has not had a previous visit with a dietitian. She rarely participates in exercise. (Averaging 130's) An ACE inhibitor/angiotensin II receptor blocker is not being taken. Eye exam is not current (she is having eye treatment with Dr. Ashley [name] for cataract, Dr.Groat is her opthalmologist).     Past Medical History:  Diagnosis Date  . Anemia 01/07/2020   REQUIRING TRANSFUSION  . Back pain   . Diabetes mellitus without complication (HCC)   . GERD (gastroesophageal reflux disease)   . Hypertension   . Olecranon bursitis of left elbow       Family History  Problem Relation Age of Onset  . Diabetes Mother   . Hypertension Mother   . Diabetes Father   . Hypertension Father      Current Outpatient Medications:  .  Accu-Chek Softclix Lancets lancets, Use as instructed, Disp: 100 each, Rfl: 2 .  amLODipine (NORVASC) 5 MG tablet, TAKE 1 TABLET (5 MG TOTAL) BY MOUTH DAILY., Disp: 90 tablet, Rfl: 1 .  aspirin EC 325 MG EC tablet, Take 1 tablet (325 mg total) by mouth daily. Take x 1 month post op to decrease risk of blood clots. (Patient taking differently: Take 325 mg by mouth daily. ), Disp: 30 tablet, Rfl: 0 .  Blood Glucose Monitoring Suppl (ACCU-CHEK AVIVA) device, Use to check blood sugars daily E11.9, Disp: 1 each, Rfl: 0 .  Continuous Blood Gluc Receiver (FREESTYLE LIBRE READER) DEVI, 1 each by Does not apply route 4 (four) times daily., Disp: 1 Device, Rfl: 0 .  Continuous Blood Gluc Sensor (FREESTYLE LIBRE SENSOR SYSTEM) MISC, 1 each by Does not apply route 4 (four) times daily., Disp: 6 each, Rfl: 3 .  glucose blood (ACCU-CHEK AVIVA PLUS) test strip, Use as instructed, Disp: 100 each, Rfl: 2 .  Insulin Degludec-Liraglutide (XULTOPHY) 100-3.6 UNIT-MG/ML SOPN, Inject 24 Units into the skin daily. (Patient taking differently: Inject 15 Units into the skin daily. ), Disp: 15 mL, Rfl: 1 .  Insulin Pen Needle (BD PEN NEEDLE NANO U/F) 32G X 4 MM MISC, USE FOR 1 INJECTION DAILY, Disp: 100 each, Rfl: 1 .  Vitamin D, Ergocalciferol, (DRISDOL) 1.25 MG (50000 UNIT) CAPS capsule, TAKE 1 CAPSULE (  50,000 UNITS TOTAL) BY MOUTH EVERY 7 (SEVEN) DAYS., Disp: 12 capsule, Rfl: 0 .  ferrous sulfate 325 (65 FE) MG tablet, Take 1 tablet (325 mg total) by mouth 2 (two) times daily with a meal., Disp: 60 tablet, Rfl: 1 .  valsartan (DIOVAN) 160 MG tablet, Take 1 tablet (160 mg total) by mouth daily., Disp: 90 tablet, Rfl: 1   No Known Allergies   Review of Systems  Constitutional: Negative.  Negative for fatigue.  Eyes: Negative for blurred  vision.  Respiratory: Negative.   Cardiovascular: Negative.  Negative for chest pain, palpitations and leg swelling.  Endocrine: Negative for polydipsia, polyphagia and polyuria.  Musculoskeletal: Negative.   Skin: Negative.   Neurological: Negative for dizziness and headaches.  Psychiatric/Behavioral: Negative.  Negative for agitation and behavioral problems.     Today's Vitals   04/13/20 1422  BP: 140/72  Pulse: 64  Temp: 98.4 F (36.9 C)  TempSrc: Oral  Weight: 204 lb 12.8 oz (92.9 kg)  Height: 5' 0.6" (1.539 m)  PainSc: 0-No pain   Body mass index is 39.21 kg/m.   Objective:  Physical Exam Constitutional:      General: She is not in acute distress.    Appearance: Normal appearance. She is obese.  Eyes:     Pupils: Pupils are equal, round, and reactive to light.  Cardiovascular:     Rate and Rhythm: Normal rate and regular rhythm.     Pulses: Normal pulses.     Heart sounds: Normal heart sounds. No murmur heard.   Pulmonary:     Effort: Pulmonary effort is normal. No respiratory distress.     Breath sounds: Normal breath sounds.  Musculoskeletal:     Right lower leg: No edema.     Left lower leg: No edema.  Skin:    General: Skin is warm and dry.     Capillary Refill: Capillary refill takes less than 2 seconds.  Neurological:     General: No focal deficit present.     Mental Status: She is alert and oriented to person, place, and time.     Cranial Nerves: No cranial nerve deficit.  Psychiatric:        Mood and Affect: Mood normal.        Behavior: Behavior normal.        Thought Content: Thought content normal.        Judgment: Judgment normal.         Assessment And Plan:     1. Essential hypertension  Chronic, fair control with blood pressure   Continue with low salt diet - CBC with Differential/Platelet  2. Type 2 diabetes mellitus without complication, without long-term current use of insulin (HCC)  Chronic, improving   Continue with  current medications  Encouraged to limit intake of sugary foods and drinks  Encouraged to increase physical activity to 150 minutes per week as tolerated - Hemoglobin A1c  3. Hypertensive retinopathy of both eyes  Continue with follow up with Ophthalmology  4. Other folate deficiency anemias  Will recheck iron studies - Iron, TIBC and Ferritin Panel - Vitamin B12 - TSH  5. Colon cancer screening  She is not interested in doing a colonoscopy  I have advised her if her cologuard is positive she will need to have a colonoscopy. - Cologuard  6. Depression, unspecified depression type Comments: she is being seen by a counselor   7. History of trichomoniasis - Chlamydia/Gonococcus/Trichomonas, NAA     Patient was  given opportunity to ask questions. Patient verbalized understanding of the plan and was able to repeat key elements of the plan. All questions were answered to their satisfaction.  Arnette Felts, FNP   I, Arnette Felts, FNP, have reviewed all documentation for this visit. The documentation on 05/13/20 for the exam, diagnosis, procedures, and orders are all accurate and complete.   THE PATIENT IS ENCOURAGED TO PRACTICE SOCIAL DISTANCING DUE TO THE COVID-19 PANDEMIC.

## 2020-04-14 ENCOUNTER — Ambulatory Visit (INDEPENDENT_AMBULATORY_CARE_PROVIDER_SITE_OTHER): Payer: BC Managed Care – PPO | Admitting: Psychology

## 2020-04-14 DIAGNOSIS — F331 Major depressive disorder, recurrent, moderate: Secondary | ICD-10-CM

## 2020-04-14 LAB — CBC WITH DIFFERENTIAL/PLATELET
Basophils Absolute: 0.1 10*3/uL (ref 0.0–0.2)
Basos: 1 %
EOS (ABSOLUTE): 0.2 10*3/uL (ref 0.0–0.4)
Eos: 1 %
Hematocrit: 40 % (ref 34.0–46.6)
Hemoglobin: 11.9 g/dL (ref 11.1–15.9)
Immature Grans (Abs): 0 10*3/uL (ref 0.0–0.1)
Immature Granulocytes: 0 %
Lymphocytes Absolute: 1.9 10*3/uL (ref 0.7–3.1)
Lymphs: 16 %
MCH: 23.3 pg — ABNORMAL LOW (ref 26.6–33.0)
MCHC: 29.8 g/dL — ABNORMAL LOW (ref 31.5–35.7)
MCV: 78 fL — ABNORMAL LOW (ref 79–97)
Monocytes Absolute: 0.8 10*3/uL (ref 0.1–0.9)
Monocytes: 7 %
Neutrophils Absolute: 8.9 10*3/uL — ABNORMAL HIGH (ref 1.4–7.0)
Neutrophils: 75 %
Platelets: 266 10*3/uL (ref 150–450)
RBC: 5.1 x10E6/uL (ref 3.77–5.28)
RDW: 19.2 % — ABNORMAL HIGH (ref 11.7–15.4)
WBC: 11.9 10*3/uL — ABNORMAL HIGH (ref 3.4–10.8)

## 2020-04-14 LAB — HEMOGLOBIN A1C
Est. average glucose Bld gHb Est-mCnc: 151 mg/dL
Hgb A1c MFr Bld: 6.9 % — ABNORMAL HIGH (ref 4.8–5.6)

## 2020-04-14 LAB — CHLAMYDIA/GONOCOCCUS/TRICHOMONAS, NAA
Chlamydia by NAA: NEGATIVE
Gonococcus by NAA: NEGATIVE
Trich vag by NAA: NEGATIVE

## 2020-04-14 LAB — IRON,TIBC AND FERRITIN PANEL
Ferritin: 53 ng/mL (ref 15–150)
Iron Saturation: 14 % — ABNORMAL LOW (ref 15–55)
Iron: 42 ug/dL (ref 27–139)
Total Iron Binding Capacity: 301 ug/dL (ref 250–450)
UIBC: 259 ug/dL (ref 118–369)

## 2020-04-14 LAB — VITAMIN B12: Vitamin B-12: 422 pg/mL (ref 232–1245)

## 2020-04-14 LAB — TSH: TSH: 0.855 u[IU]/mL (ref 0.450–4.500)

## 2020-04-15 ENCOUNTER — Other Ambulatory Visit: Payer: Self-pay | Admitting: Nurse Practitioner

## 2020-04-18 ENCOUNTER — Telehealth: Payer: Self-pay | Admitting: *Deleted

## 2020-04-18 NOTE — Telephone Encounter (Signed)
[name] at [facility] called to confirm patient's negative HIV labs.   Andree Coss, RN

## 2020-05-03 ENCOUNTER — Encounter: Payer: Self-pay | Admitting: Nurse Practitioner

## 2020-05-04 ENCOUNTER — Encounter (INDEPENDENT_AMBULATORY_CARE_PROVIDER_SITE_OTHER): Payer: BC Managed Care – PPO | Admitting: Ophthalmology

## 2020-05-04 ENCOUNTER — Other Ambulatory Visit: Payer: Self-pay

## 2020-05-04 DIAGNOSIS — E11311 Type 2 diabetes mellitus with unspecified diabetic retinopathy with macular edema: Secondary | ICD-10-CM

## 2020-05-04 DIAGNOSIS — I10 Essential (primary) hypertension: Secondary | ICD-10-CM

## 2020-05-04 DIAGNOSIS — E113512 Type 2 diabetes mellitus with proliferative diabetic retinopathy with macular edema, left eye: Secondary | ICD-10-CM | POA: Diagnosis not present

## 2020-05-04 DIAGNOSIS — H34812 Central retinal vein occlusion, left eye, with macular edema: Secondary | ICD-10-CM

## 2020-05-04 DIAGNOSIS — H43813 Vitreous degeneration, bilateral: Secondary | ICD-10-CM

## 2020-05-04 DIAGNOSIS — E113311 Type 2 diabetes mellitus with moderate nonproliferative diabetic retinopathy with macular edema, right eye: Secondary | ICD-10-CM | POA: Diagnosis not present

## 2020-05-04 DIAGNOSIS — H35033 Hypertensive retinopathy, bilateral: Secondary | ICD-10-CM

## 2020-05-09 ENCOUNTER — Ambulatory Visit (INDEPENDENT_AMBULATORY_CARE_PROVIDER_SITE_OTHER): Payer: BC Managed Care – PPO | Admitting: Psychology

## 2020-05-09 DIAGNOSIS — F331 Major depressive disorder, recurrent, moderate: Secondary | ICD-10-CM

## 2020-05-10 ENCOUNTER — Telehealth: Payer: BC Managed Care – PPO

## 2020-05-10 ENCOUNTER — Other Ambulatory Visit: Payer: Self-pay

## 2020-05-12 ENCOUNTER — Telehealth: Payer: BC Managed Care – PPO

## 2020-05-12 ENCOUNTER — Telehealth: Payer: Self-pay

## 2020-05-12 NOTE — Telephone Encounter (Signed)
  Chronic Care Management   Outreach Note  05/12/2020 Name: [name] MRN: [id number] DOB: 1956/07/28  Referred by: Arnette Felts, FNP Reason for referral : Care Coordination   An unsuccessful telephone outreach was attempted today. The patient was referred to the case management team for assistance with care management and care coordination.   Follow Up Plan: A HIPPA compliant phone message was left for the patient providing contact information and requesting a return call.  The care management team will reach out to the patient again over the next 45 days.   Bevelyn Ngo, BSW, CDP Social Worker, Certified Dementia Practitioner [facility] [phone]

## 2020-05-18 ENCOUNTER — Other Ambulatory Visit: Payer: Self-pay | Admitting: Nurse Practitioner

## 2020-05-18 DIAGNOSIS — E559 Vitamin D deficiency, unspecified: Secondary | ICD-10-CM

## 2020-05-24 ENCOUNTER — Ambulatory Visit (INDEPENDENT_AMBULATORY_CARE_PROVIDER_SITE_OTHER): Payer: BC Managed Care – PPO | Admitting: Psychology

## 2020-05-24 DIAGNOSIS — F331 Major depressive disorder, recurrent, moderate: Secondary | ICD-10-CM

## 2020-05-31 ENCOUNTER — Ambulatory Visit: Payer: BC Managed Care – PPO | Admitting: Psychology

## 2020-06-03 ENCOUNTER — Encounter (INDEPENDENT_AMBULATORY_CARE_PROVIDER_SITE_OTHER): Payer: BC Managed Care – PPO | Admitting: Ophthalmology

## 2020-06-03 ENCOUNTER — Other Ambulatory Visit: Payer: Self-pay

## 2020-06-03 DIAGNOSIS — E113512 Type 2 diabetes mellitus with proliferative diabetic retinopathy with macular edema, left eye: Secondary | ICD-10-CM | POA: Diagnosis not present

## 2020-06-03 DIAGNOSIS — I10 Essential (primary) hypertension: Secondary | ICD-10-CM

## 2020-06-03 DIAGNOSIS — E11311 Type 2 diabetes mellitus with unspecified diabetic retinopathy with macular edema: Secondary | ICD-10-CM

## 2020-06-03 DIAGNOSIS — E113311 Type 2 diabetes mellitus with moderate nonproliferative diabetic retinopathy with macular edema, right eye: Secondary | ICD-10-CM

## 2020-06-03 DIAGNOSIS — H43813 Vitreous degeneration, bilateral: Secondary | ICD-10-CM

## 2020-06-03 DIAGNOSIS — H35033 Hypertensive retinopathy, bilateral: Secondary | ICD-10-CM

## 2020-06-03 DIAGNOSIS — H34812 Central retinal vein occlusion, left eye, with macular edema: Secondary | ICD-10-CM

## 2020-06-16 ENCOUNTER — Other Ambulatory Visit: Payer: Self-pay | Admitting: Nurse Practitioner

## 2020-06-16 ENCOUNTER — Ambulatory Visit (INDEPENDENT_AMBULATORY_CARE_PROVIDER_SITE_OTHER): Payer: BC Managed Care – PPO | Admitting: Psychology

## 2020-06-16 DIAGNOSIS — F331 Major depressive disorder, recurrent, moderate: Secondary | ICD-10-CM | POA: Diagnosis not present

## 2020-06-17 ENCOUNTER — Telehealth: Payer: Self-pay

## 2020-06-17 ENCOUNTER — Telehealth: Payer: BC Managed Care – PPO

## 2020-06-17 ENCOUNTER — Other Ambulatory Visit: Payer: Self-pay | Admitting: Nurse Practitioner

## 2020-06-17 NOTE — Telephone Encounter (Cosign Needed)
°  Care Management   Outreach Note  06/17/2020 Name: [name] MRN: [id number] DOB: 1955-11-24  Referred by: Arnette Felts, FNP Reason for referral : Care Coordination (CC RNCM FU Call )   An unsuccessful telephone outreach was attempted today. The patient was referred to the case management team for assistance with care management and care coordination.   Follow Up Plan: A HIPAA compliant phone message was left for the patient providing contact information and requesting a return call.  Telephone follow up appointment with care management team member scheduled for: 08/08/20  Delsa Sale, RN, BSN, CCM Care Management Coordinator [facility]  Direct Phone: [phone]

## 2020-06-28 ENCOUNTER — Telehealth: Payer: BC Managed Care – PPO

## 2020-06-28 ENCOUNTER — Telehealth: Payer: Self-pay

## 2020-06-28 NOTE — Telephone Encounter (Cosign Needed)
  Chronic Care Management   Outreach Note  06/28/2020 Name: [name] MRN: [id number] DOB: 12-31-1955  Referred by: Arnette Felts, FNP Reason for referral : Care Coordination (Inbound Call from patient )  Inbound call received from patient with voice message left requesting a return phone call. An unsuccessful telephone outreach was attempted today. The patient was referred to the case management team for assistance with care management and care coordination.   Follow Up Plan: A HIPAA compliant phone message was left for the patient providing contact information and requesting a return call.  Telephone follow up appointment with care management team member scheduled for: 07/01/20  Delsa Sale, RN, BSN, CCM Care Management Coordinator [facility] Care Management/[facility]  Direct Phone: [phone]

## 2020-06-29 ENCOUNTER — Other Ambulatory Visit: Payer: Self-pay

## 2020-06-29 ENCOUNTER — Telehealth: Payer: BC Managed Care – PPO

## 2020-06-29 ENCOUNTER — Other Ambulatory Visit: Payer: Self-pay | Admitting: Nurse Practitioner

## 2020-06-29 ENCOUNTER — Ambulatory Visit: Payer: Self-pay

## 2020-06-29 DIAGNOSIS — I10 Essential (primary) hypertension: Secondary | ICD-10-CM

## 2020-06-29 DIAGNOSIS — F32A Depression, unspecified: Secondary | ICD-10-CM

## 2020-06-29 DIAGNOSIS — E119 Type 2 diabetes mellitus without complications: Secondary | ICD-10-CM

## 2020-06-29 DIAGNOSIS — H35033 Hypertensive retinopathy, bilateral: Secondary | ICD-10-CM

## 2020-06-29 MED ORDER — ESCITALOPRAM OXALATE 10 MG PO TABS: 10.0000 mg | ORAL_TABLET | Freq: Every day | ORAL | 2 refills | Status: DC

## 2020-07-01 ENCOUNTER — Telehealth: Payer: Self-pay

## 2020-07-01 ENCOUNTER — Ambulatory Visit: Payer: BC Managed Care – PPO

## 2020-07-01 DIAGNOSIS — H35033 Hypertensive retinopathy, bilateral: Secondary | ICD-10-CM

## 2020-07-01 DIAGNOSIS — I10 Essential (primary) hypertension: Secondary | ICD-10-CM

## 2020-07-01 DIAGNOSIS — E119 Type 2 diabetes mellitus without complications: Secondary | ICD-10-CM

## 2020-07-01 DIAGNOSIS — F32A Depression, unspecified: Secondary | ICD-10-CM

## 2020-07-01 NOTE — Patient Instructions (Signed)
Visit Information  Goals Addressed            This Visit's Progress    Collaborate with RN Care Manager to perform approrpiate assessments to assist with care management and care coordination needs       CARE PLAN ENTRY (see longitudinal plan of care for additional care plan information)  Current Barriers:   Financial constraints related to cost of transportation  Transportation  Chronic conditions including DM II, HTN, and Depression which put patient at increased risk for hospitalization  Social Work Clinical Goal(s):   Over the next 5 days the patient will attend her eye specialist appointment  Over the next 30 days the patient will obtain and administer new medication as prescribed by her primary provider  Over the next 90 days the patient will work with SW to address ongoing care coordination needs  CCM SW Interventions: Completed 07/01/20  Inter-disciplinary care team collaboration (see longitudinal plan of care)  Successful outbound call placed to the patient to assist with care coordination needs  Determined the patient has an upcomming eye specialist appointment on Monday 10/25 and has not yet secured transportation o Discussed the patient may have to pay for a [employer] driver but is concerned surrounding the cost due to limited income  Arranged transportation for the patient using [employer] o Patient pick up time scheduled between 7:50 am - 8:00 am o Patient aware of pick up time  Discussed the patients therapist has contacted the patient primary provider, Arnette Felts FNP, to request medication for Depression o Determined the patient has yet to pick this medication up from the pharmacy but plans to "tomorrow" o Encouraged the patient to pick up medication and take as prescribed  o Discussed the patient is hesitant but will "try it" o Reviewed medications take several weeks to reach full effectiveness o The patient stated she would pick the  medication up and take it every morning for a minimum of 1 month  Scheduled follow up call over the next month  Patient Self Care Activities:   Patient verbalizes understanding of plan to contact SW as needed prior to next scheduled call  Self administers medications as prescribed  Calls pharmacy for medication refills  Performs ADL's independently  Performs IADL's independently  Initial goal documentation        SW will follow up with the patient over the next month.  Bevelyn Ngo, BSW, CDP Social Worker, Certified Dementia Practitioner [facility] Care Management [phone]

## 2020-07-01 NOTE — Chronic Care Management (AMB) (Signed)
  Care Management   Follow Up Note   07/01/2020 Name: [name] MRN: [id number] DOB: 07/14/1956  Referred by: Arnette Felts, FNP Reason for referral : Care Coordination   [name] is a 64 y.o. year old female who is a primary care patient of Arnette Felts, FNP. The care management team was consulted for assistance with care management and care coordination needs.    Review of patient status, including review of consultants reports, relevant laboratory and other test results, and collaboration with appropriate care team members and the patient's provider was performed as part of comprehensive patient evaluation and provision of chronic care management services.    SDOH (Social Determinants of Health) assessments performed: No See Care Plan activities for detailed interventions related to [facility])     Advanced Directives: See Care Plan and Vynca application for related entries.   Goals Addressed            This Visit's Progress   . Collaborate with RN Care Manager to perform approrpiate assessments to assist with care management and care coordination needs       CARE PLAN ENTRY (see longitudinal plan of care for additional care plan information)  Current Barriers:  . Financial constraints related to cost of transportation . Transportation . Chronic conditions including DM II, HTN, and Depression which put patient at increased risk for hospitalization  Social Work Clinical Goal(s):  [name] Over the next 5 days the patient will attend her eye specialist appointment . Over the next 30 days the patient will obtain and administer new medication as prescribed by her primary provider . Over the next 90 days the patient will work with SW to address ongoing care coordination needs  CCM SW Interventions: Completed 07/01/20 . Inter-disciplinary care team collaboration (see longitudinal plan of care) . Successful outbound call placed to the patient to assist with care  coordination needs . Determined the patient has an upcomming eye specialist appointment on Monday 10/25 and has not yet secured transportation o Discussed the patient may have to pay for a [employer] driver but is concerned surrounding the cost due to limited income . Arranged transportation for the patient using [employer] o Patient pick up time scheduled between 7:50 am - 8:00 am o Patient aware of pick up time . Discussed the patients therapist has contacted the patient primary provider, Arnette Felts FNP, to request medication for Depression o Determined the patient has yet to pick this medication up from the pharmacy but plans to "tomorrow" o Encouraged the patient to pick up medication and take as prescribed  o Discussed the patient is hesitant but will "try it" o Reviewed medications take several weeks to reach full effectiveness o The patient stated she would pick the medication up and take it every morning for a minimum of 1 month . Scheduled follow up call over the next month  Patient Self Care Activities:  . Patient verbalizes understanding of plan to contact SW as needed prior to next scheduled call . Self administers medications as prescribed . Calls pharmacy for medication refills . Performs ADL's independently . Performs IADL's independently  Initial goal documentation         SW will follow up with the patient over the next month.  Bevelyn Ngo, BSW, CDP Social Worker, Certified Dementia Practitioner [facility] Care Management [phone]

## 2020-07-01 NOTE — Telephone Encounter (Signed)
  Chronic Care Management   Outreach Note  07/01/2020 Name: [name] MRN: [id number] DOB: 05-20-1956  Referred by: Arnette Felts, FNP Reason for referral : Care Coordination   An unsuccessful telephone outreach was attempted today. The patient was referred to the case management team for assistance with care management and care coordination.   Follow Up Plan: A HIPAA compliant phone message was left for the patient providing contact information and requesting a return call.  The care management team will reach out to the patient again over the next 14 days.   Bevelyn Ngo, BSW, CDP Social Worker, Certified Dementia Practitioner [facility] Care Management [phone]

## 2020-07-01 NOTE — Chronic Care Management (AMB) (Signed)
Care Management   Follow Up Note   06/29/2020 Name: [name] MRN: [id number] DOB: May 07, 1956  Referred by: Arnette Felts, FNP Reason for referral : Care Coordination (Inbound Call from patient )   [name] is a 64 y.o. year old female who is a primary care patient of Arnette Felts, FNP. The CCM team was consulted for assistance with chronic disease management and care coordination needs.    Review of patient status, including review of consultants reports, relevant laboratory and other test results, and collaboration with appropriate care team members and the patient's provider was performed as part of comprehensive patient evaluation and provision of chronic care management services.    SDOH (Social Determinants of Health) assessments performed: Yes - transportation  See Care Plan activities for detailed interventions related to [facility])   Outpatient Encounter Medications as of 06/29/2020  Medication Sig  . Accu-Chek Softclix Lancets lancets Use as instructed  . amLODipine (NORVASC) 5 MG tablet TAKE 1 TABLET (5 MG TOTAL) BY MOUTH DAILY.  [name] aspirin EC 325 MG EC tablet Take 1 tablet (325 mg total) by mouth daily. Take x 1 month post op to decrease risk of blood clots. (Patient taking differently: Take 325 mg by mouth daily. )  . Blood Glucose Monitoring Suppl (ACCU-CHEK AVIVA) device Use to check blood sugars daily E11.9  . Continuous Blood Gluc Receiver (FREESTYLE LIBRE READER) DEVI 1 each by Does not apply route 4 (four) times daily.  . Continuous Blood Gluc Sensor (FREESTYLE LIBRE SENSOR SYSTEM) MISC 1 each by Does not apply route 4 (four) times daily.  [name] escitalopram (LEXAPRO) 10 MG tablet Take 1 tablet (10 mg total) by mouth daily.  [name] glucose blood (ACCU-CHEK AVIVA PLUS) test strip Use as instructed  . Insulin Degludec-Liraglutide (XULTOPHY) 100-3.6 UNIT-MG/ML SOPN Inject 15 Units into the skin daily.  . Insulin Pen Needle (BD PEN NEEDLE NANO U/F) 32G X 4 MM MISC  USE FOR 1 INJECTION DAILY  . valsartan (DIOVAN) 160 MG tablet Take 1 tablet (160 mg total) by mouth daily.  . Vitamin D, Ergocalciferol, (DRISDOL) 1.25 MG (50000 UNIT) CAPS capsule TAKE 1 CAPSULE (50,000 UNITS TOTAL) BY MOUTH EVERY 7 (SEVEN) DAYS.   No facility-administered encounter medications on file as of 06/29/2020.     Objective:  Lab Results  Component Value Date   HGBA1C 6.9 (H) 04/13/2020   HGBA1C 5.5 01/12/2020   HGBA1C 7.2 (H) 11/18/2019   Lab Results  Component Value Date   MICROALBUR 150 01/12/2020   LDLCALC 80 11/18/2019   CREATININE 0.97 01/12/2020   BP Readings from Last 3 Encounters:  04/13/20 140/72  03/01/20 (!) 204/84  01/12/20 140/80    Goals Addressed      Patient Stated   .  COMPLETED: "I am concerned about my test results" (pt-stated)        CARE PLAN ENTRY (see longitudinal plan of care for additional care plan information)  Current Barriers:  [name] Knowledge Deficits related to ongoing evaluation and treatment of abnormal lab results including positive CMV Ab - IgG . Chronic Disease Management support and education needs related to Depression, Essential HTN, Iron-deficiency Anemia, DMII  Nurse Case Manager Clinical Goal(s):  [name] Over the next 90 days, patient will work with the CCM team and PCP  to address needs related to evaluation and treatment of positive CMV Ab-IgG  CCM RN CM Interventions:  06/29/20 call completed with patient  . Inter-disciplinary care team collaboration (see longitudinal plan of  care) . Evaluation of current treatment plan related to Postivie CMV Ab-IgG and patient's adherence to plan as established by provider . Determined patient continues to f/u with Dr. Forde Radon for talk therapy and is finding this therapy to be very effective for her depression  . Determined she completed her follow up visit with Infectious disease, Dr. Luciana Axe MD and was advised no further follow up is required at this time . Determined patient has  completed all MD f/u appointments and completed labs as directed with negative results  . Discussed plans with patient for ongoing care management follow up and provided patient with direct contact information for care management team  Patient Self Care Activities:  . Patient verbalizes understanding of plan to f/u with PCP again in August for re-evaluation of abnormal labs . Self administers medications as prescribed . Attends all scheduled provider appointments . Calls pharmacy for medication refills . Calls provider office for new concerns or questions  Please see past updates related to this goal by clicking on the "Past Updates" button in the selected goal      .  "to be able to have more good days than bad days" (pt-stated)   Not on track     CARE PLAN ENTRY (see longitudinal plan of care for additional care plan information)  Current Barriers:  [name] Knowledge Deficits related to disease process and Self Health management of Depression  . Chronic Disease Management support and education needs related to Essential Hypertension, Diabetes Mellitus type 2, Hypertensive Retinopathy, Depression  . Corporate treasurer.   Nurse Case Manager Clinical Goal(s):  [name] Over the next 180 days, patient will work with the CCM team and PCP to address needs related to disease education and support to improve Self Health management of Depression   CCM RN CM Interventions:  . Inter-disciplinary care team collaboration (see longitudinal plan of care) . Evaluation of current treatment plan related to Depression  and patient's adherence to plan as established by provider. . Provided education to patient re: disease process and treatment recommendations for management of depression  . Determined patient continues to receive talk therapy by licensed professional from [employer]  . Determined patient feels she may need to start an antidepressant to help her cope more effectively, although  reluctant due to fear of SE  . Collaborated with PCP Arnette Felts FNP regarding patient's request to start an antidepressant  . Discussed plans with patient for ongoing care management follow up and provided patient with direct contact information for care management team . Provided patient with printed educational materials related to Mood and Your Health zone safety tool  07/01/20 call completed with patient  . Informed patient PCP prescribed escitalopram (LEXAPRO) 10 MG tablet, take 1 tablet daily, patient verbalizes understanding    Patient Self Care Activities:  . Self administers medications as prescribed . Attends all scheduled provider appointments . Calls pharmacy for medication refills . Calls provider office for new concerns or questions  Initial goal documentation     .  "to keep my BP well controlled" (pt-stated)   On track     CARE PLAN ENTRY (see longitudinal plan of care for additional care plan information)  Current Barriers:  [name] Knowledge Deficits related to disease process and Self Health management of HTN . Chronic Disease Management support and education needs related to Depression, Essential Hypertension, Iron deficiency Anemia, DMII  Nurse Case Manager Clinical Goal(s):  [name] Over the next 90 days, patient will work  with the CCM team and PCP to address needs related to disease education and support to improve Self Health management of HTN  CCM RN CM Interventions:  06/29/20 call completed with patient  . Inter-disciplinary care team collaboration (see longitudinal plan of care) . Evaluation of current treatment plan related to HTN and patient's adherence to plan as established by provider; Reviewed and discussed last OV BP is noted to  be 140/70 . Re-educated patient re: target BP < 130/80; Determined patient continues not to have a BP cuff at home, she continues to spot check her BP when visiting her local pharmacy; Re-educated on importance of consistent BP control  for best chance to prevent Cardiovascular events and or renal failure . Determined patient continues to be adherent with taking her BP medications exactly as prescribed  . Sent in basket message to PCP provider Arnette Felts, FNP regarding need to send a faxed Rx for a BP cuff to [facility] fax # [phone], including patient demographics and insurance information in order for the pharmacy to bill patient's insurance, patient is aware and agreeable  . Mailed printed educational materials related to What is High Blood Pressure?; High Blood Pressure and Stroke; African American and High Blood Pressure; Why Should I Lower Sodium?; Life's Simple 7  . Discussed plans with patient for ongoing care management follow up and provided patient with direct contact information for care management team  Patient Self Care Activities:  . Self administers medications as prescribed . Attends all scheduled provider appointments . Calls pharmacy for medication refills . Calls provider office for new concerns or questions  Please see past updates related to this goal by clicking on the "Past Updates" button in the selected goal        Plan:   Telephone follow up appointment with care management team member scheduled for: 08/08/20  Delsa Sale, RN, BSN, CCM Care Management Coordinator [facility] Care Management/[facility]  Direct Phone: [phone]

## 2020-07-01 NOTE — Patient Instructions (Signed)
Visit Information  Goals Addressed      Patient Stated   .  COMPLETED: "I am concerned about my test results" (pt-stated)        CARE PLAN ENTRY (see longitudinal plan of care for additional care plan information)  Current Barriers:  [name] Knowledge Deficits related to ongoing evaluation and treatment of abnormal lab results including positive CMV Ab - IgG . Chronic Disease Management support and education needs related to Depression, Essential HTN, Iron-deficiency Anemia, DMII  Nurse Case Manager Clinical Goal(s):  [name] Over the next 90 days, patient will work with the CCM team and PCP  to address needs related to evaluation and treatment of positive CMV Ab-IgG  CCM RN CM Interventions:  06/29/20 call completed with patient  . Inter-disciplinary care team collaboration (see longitudinal plan of care) . Evaluation of current treatment plan related to Postivie CMV Ab-IgG and patient's adherence to plan as established by provider . Determined patient continues to f/u with Dr. Forde Radon for talk therapy and is finding this therapy to be very effective for her depression  . Determined she completed her follow up visit with Infectious disease, Dr. Luciana Axe MD and was advised no further follow up is required at this time . Determined patient has completed all MD f/u appointments and completed labs as directed with negative results  . Discussed plans with patient for ongoing care management follow up and provided patient with direct contact information for care management team  Patient Self Care Activities:  . Patient verbalizes understanding of plan to f/u with PCP again in August for re-evaluation of abnormal labs . Self administers medications as prescribed . Attends all scheduled provider appointments . Calls pharmacy for medication refills . Calls provider office for new concerns or questions  Please see past updates related to this goal by clicking on the "Past Updates" button in the selected  goal      .  "to be able to have more good days than bad days" (pt-stated)   Not on track     CARE PLAN ENTRY (see longitudinal plan of care for additional care plan information)  Current Barriers:  [name] Knowledge Deficits related to disease process and Self Health management of Depression  . Chronic Disease Management support and education needs related to Essential Hypertension, Diabetes Mellitus type 2, Hypertensive Retinopathy, Depression  . Corporate treasurer.   Nurse Case Manager Clinical Goal(s):  [name] Over the next 180 days, patient will work with the CCM team and PCP to address needs related to disease education and support to improve Self Health management of Depression   CCM RN CM Interventions:  . Inter-disciplinary care team collaboration (see longitudinal plan of care) . Evaluation of current treatment plan related to Depression  and patient's adherence to plan as established by provider. . Provided education to patient re: disease process and treatment recommendations for management of depression  . Determined patient continues to receive talk therapy by licensed professional from [employer]  . Determined patient feels she may need to start an antidepressant to help her cope more effectively, although reluctant due to fear of SE  . Collaborated with PCP Arnette Felts FNP regarding patient's request to start an antidepressant  . Discussed plans with patient for ongoing care management follow up and provided patient with direct contact information for care management team . Provided patient with printed educational materials related to Mood and Your Health zone safety tool  07/01/20 call completed with patient  .  Informed patient PCP prescribed escitalopram (LEXAPRO) 10 MG tablet, take 1 tablet daily, patient verbalizes understanding    Patient Self Care Activities:  . Self administers medications as prescribed . Attends all scheduled provider  appointments . Calls pharmacy for medication refills . Calls provider office for new concerns or questions  Initial goal documentation     .  "to keep my BP well controlled" (pt-stated)   On track     CARE PLAN ENTRY (see longitudinal plan of care for additional care plan information)  Current Barriers:  [name] Knowledge Deficits related to disease process and Self Health management of HTN . Chronic Disease Management support and education needs related to Depression, Essential Hypertension, Iron deficiency Anemia, DMII  Nurse Case Manager Clinical Goal(s):  [name] Over the next 90 days, patient will work with the CCM team and PCP to address needs related to disease education and support to improve Self Health management of HTN  CCM RN CM Interventions:  06/29/20 call completed with patient  . Inter-disciplinary care team collaboration (see longitudinal plan of care) . Evaluation of current treatment plan related to HTN and patient's adherence to plan as established by provider; Reviewed and discussed last OV BP is noted to  be 140/70 . Re-educated patient re: target BP < 130/80; Determined patient continues not to have a BP cuff at home, she continues to spot check her BP when visiting her local pharmacy; Re-educated on importance of consistent BP control for best chance to prevent Cardiovascular events and or renal failure . Determined patient continues to be adherent with taking her BP medications exactly as prescribed  . Sent in basket message to PCP provider Arnette Felts, FNP regarding need to send a faxed Rx for a BP cuff to [facility] fax # [phone], including patient demographics and insurance information in order for the pharmacy to bill patient's insurance, patient is aware and agreeable  . Mailed printed educational materials related to What is High Blood Pressure?; High Blood Pressure and Stroke; African American and High Blood Pressure; Why Should I Lower Sodium?; Life's  Simple 7  . Discussed plans with patient for ongoing care management follow up and provided patient with direct contact information for care management team  Patient Self Care Activities:  . Self administers medications as prescribed . Attends all scheduled provider appointments . Calls pharmacy for medication refills . Calls provider office for new concerns or questions  Please see past updates related to this goal by clicking on the "Past Updates" button in the selected goal        Patient verbalizes understanding of instructions provided today.   Telephone follow up appointment with care management team member scheduled for: 08/08/20  Delsa Sale, RN, BSN, CCM Care Management Coordinator [facility] Care Management/[facility]  Direct Phone: [phone]

## 2020-07-04 ENCOUNTER — Other Ambulatory Visit: Payer: Self-pay

## 2020-07-04 ENCOUNTER — Encounter (INDEPENDENT_AMBULATORY_CARE_PROVIDER_SITE_OTHER): Payer: BC Managed Care – PPO | Admitting: Ophthalmology

## 2020-07-04 DIAGNOSIS — H34812 Central retinal vein occlusion, left eye, with macular edema: Secondary | ICD-10-CM

## 2020-07-04 DIAGNOSIS — E113311 Type 2 diabetes mellitus with moderate nonproliferative diabetic retinopathy with macular edema, right eye: Secondary | ICD-10-CM

## 2020-07-04 DIAGNOSIS — E113512 Type 2 diabetes mellitus with proliferative diabetic retinopathy with macular edema, left eye: Secondary | ICD-10-CM

## 2020-07-04 DIAGNOSIS — E11311 Type 2 diabetes mellitus with unspecified diabetic retinopathy with macular edema: Secondary | ICD-10-CM

## 2020-07-04 DIAGNOSIS — H35033 Hypertensive retinopathy, bilateral: Secondary | ICD-10-CM

## 2020-07-04 DIAGNOSIS — I10 Essential (primary) hypertension: Secondary | ICD-10-CM

## 2020-07-04 DIAGNOSIS — H43813 Vitreous degeneration, bilateral: Secondary | ICD-10-CM

## 2020-07-15 ENCOUNTER — Telehealth: Payer: Self-pay

## 2020-07-15 ENCOUNTER — Telehealth: Payer: BC Managed Care – PPO

## 2020-07-15 NOTE — Telephone Encounter (Signed)
°  Chronic Care Management   Outreach Note  07/15/2020 Name: [name] MRN: [id number] DOB: Dec 03, 1955  Referred by: Arnette Felts, FNP Reason for referral : Care Coordination   An unsuccessful telephone outreach was attempted today. The patient was referred to the case management team for assistance with care management and care coordination.   Follow Up Plan: A HIPAA compliant phone message was left for the patient providing contact information and requesting a return call.  The care management team will reach out to the patient again over the next 28 days.    Bevelyn Ngo, BSW, CDP Social Worker, Certified Dementia Practitioner [facility] Care Management [phone]

## 2020-07-27 ENCOUNTER — Ambulatory Visit: Payer: Self-pay

## 2020-07-27 DIAGNOSIS — E119 Type 2 diabetes mellitus without complications: Secondary | ICD-10-CM

## 2020-07-27 DIAGNOSIS — I10 Essential (primary) hypertension: Secondary | ICD-10-CM

## 2020-07-27 DIAGNOSIS — F32A Depression, unspecified: Secondary | ICD-10-CM

## 2020-07-27 NOTE — Patient Instructions (Signed)
Social Worker Visit Information  Goals we discussed today:  Goals Addressed            This Visit's Progress   . Collaborate with RN Care Manager to perform approrpiate assessments to assist with care management and care coordination needs       CARE PLAN ENTRY (see longitudinal plan of care for additional care plan information)  Current Barriers:  . Financial constraints related to cost of transportation . Transportation . Chronic conditions including DM II, HTN, and Depression which put patient at increased risk for hospitalization  Social Work Clinical Goal(s):  [name] Over the next 5 days the patient will attend her eye specialist appointment Goal Met . Over the next 30 days the patient will obtain and administer new medication as prescribed by her primary provider . Over the next 90 days the patient will work with SW to address ongoing care coordination needs  CCM SW Interventions: Completed 07/27/20 . Successful outbound call placed to the patient in response to a voice message received requesting SW assistance . Determined the patient has an upcoming PCP appointment on 11/22 as well as an Eye appointment on 11/24 without transportation . SW secured transportation via [employer] . Advised the patient to expect a call from [facility] to confirm pick up time  Completed 07/01/20 . Inter-disciplinary care team collaboration (see longitudinal plan of care) . Successful outbound call placed to the patient to assist with care coordination needs . Determined the patient has an [place] eye specialist appointment on Monday 10/25 and has not yet secured transportation o Discussed the patient may have to pay for a [employer] driver but is concerned surrounding the cost due to limited income . Arranged transportation for the patient using [employer] o Patient pick up time scheduled between 7:50 am - 8:00 am o Patient aware of pick up time . Discussed  the patients therapist has contacted the patient primary provider, [name] FNP, to request medication for Depression o Determined the patient has yet to pick this medication up from the pharmacy but plans to "tomorrow" o Encouraged the patient to pick up medication and take as prescribed  o Discussed the patient is hesitant but will "try it" o Reviewed medications take several weeks to reach full effectiveness o The patient stated she would pick the medication up and take it every morning for a minimum of 1 month . Scheduled follow up call over the next month  Patient Self Care Activities:  . Patient verbalizes understanding of plan to contact SW as needed prior to next scheduled call . Self administers medications as prescribed . Calls pharmacy for medication refills . Performs ADL's independently . Performs IADL's independently  Please see past updates related to this goal by clicking on the "Past Updates" button in the selected goal          Follow Up Plan: SW will follow up with patient by phone over the next month.   Daneen Schick, BSW, CDP Social Worker, Certified Dementia Practitioner [place] [facility] [place] Management [phone]

## 2020-07-27 NOTE — Chronic Care Management (AMB) (Signed)
Care Management    Social Work Follow Up Note  07/27/2020 Name: [name] MRN: [id number] DOB: [birth year]  [name] is a 64 y.o. year old female who is a primary care patient of [name], [place]. The CCM team was consulted for assistance with care coordination.   Review of patient status, including review of consultants reports, other relevant assessments, and collaboration with appropriate care team members and the patient's provider was performed as part of comprehensive patient evaluation and provision of chronic care management services.    SDOH (Social Determinants of Health) assessments performed: No    Outpatient Encounter Medications as of 07/27/2020  Medication Sig  . Accu-Chek Softclix Lancets lancets Use as instructed  . amLODipine (NORVASC) 5 MG tablet TAKE 1 TABLET (5 MG TOTAL) BY MOUTH DAILY.  [name] aspirin EC 325 MG EC tablet Take 1 tablet (325 mg total) by mouth daily. Take x 1 month post op to decrease risk of blood clots. (Patient taking differently: Take 325 mg by mouth daily. )  . Blood Glucose Monitoring Suppl (ACCU-CHEK AVIVA) device Use to check blood sugars daily E11.9  . Continuous Blood Gluc Receiver (FREESTYLE LIBRE READER) DEVI 1 each by Does not apply route 4 (four) times daily.  . Continuous Blood Gluc Sensor (FREESTYLE LIBRE SENSOR SYSTEM) MISC 1 each by Does not apply route 4 (four) times daily.  [name] escitalopram (LEXAPRO) 10 MG tablet Take 1 tablet (10 mg total) by mouth daily.  . ferrous sulfate 325 (65 FE) MG tablet Take 1 tablet (325 mg total) by mouth 2 (two) times daily with a meal.  . glucose blood (ACCU-CHEK AVIVA PLUS) test strip Use as instructed  . Insulin Degludec-Liraglutide (XULTOPHY) 100-3.6 UNIT-MG/ML SOPN Inject 15 Units into the skin daily.  . Insulin Pen Needle (BD PEN NEEDLE NANO U/F) 32G X 4 MM MISC USE FOR 1 INJECTION DAILY  . valsartan (DIOVAN) 160 MG tablet Take 1 tablet (160 mg total) by mouth daily.  . Vitamin D,  Ergocalciferol, (DRISDOL) 1.25 MG (50000 UNIT) CAPS capsule TAKE 1 CAPSULE (50,000 UNITS TOTAL) BY MOUTH EVERY 7 (SEVEN) DAYS.   No facility-administered encounter medications on file as of 07/27/2020.     Goals Addressed            This Visit's Progress   . Collaborate with RN Care Manager to perform approrpiate assessments to assist with care management and care coordination needs       CARE PLAN ENTRY (see longitudinal plan of care for additional care plan information)  Current Barriers:  . Financial constraints related to cost of transportation . Transportation . Chronic conditions including DM II, HTN, and Depression which put patient at increased risk for hospitalization  Social Work Clinical Goal(s):  [name] Over the next 5 days the patient will attend her eye specialist appointment Goal Met . Over the next 30 days the patient will obtain and administer new medication as prescribed by her primary provider . Over the next 90 days the patient will work with SW to address ongoing care coordination needs  CCM SW Interventions: Completed 07/27/20 . Successful outbound call placed to the patient in response to a voice message received requesting SW assistance . Determined the patient has an upcoming PCP appointment on 11/22 as well as an Eye appointment on 11/24 without transportation . SW secured transportation via [employer] . Advised the patient to expect a call from [facility] to confirm pick up time  Completed 07/01/20 .  Inter-disciplinary care team collaboration (see longitudinal plan of care) . Successful outbound call placed to the patient to assist with care coordination needs . Determined the patient has an [place] eye specialist appointment on Monday 10/25 and has not yet secured transportation o Discussed the patient may have to pay for a [employer] driver but is concerned surrounding the cost due to limited income . Arranged transportation for the  patient using [employer] o Patient pick up time scheduled between 7:50 am - 8:00 am o Patient aware of pick up time . Discussed the patients therapist has contacted the patient primary provider, [name] FNP, to request medication for Depression o Determined the patient has yet to pick this medication up from the pharmacy but plans to "tomorrow" o Encouraged the patient to pick up medication and take as prescribed  o Discussed the patient is hesitant but will "try it" o Reviewed medications take several weeks to reach full effectiveness o The patient stated she would pick the medication up and take it every morning for a minimum of 1 month . Scheduled follow up call over the next month  Patient Self Care Activities:  . Patient verbalizes understanding of plan to contact SW as needed prior to next scheduled call . Self administers medications as prescribed . Calls pharmacy for medication refills . Performs ADL's independently . Performs IADL's independently  Please see past updates related to this goal by clicking on the "Past Updates" button in the selected goal          Follow Up Plan: SW will follow up with patient by phone over the next month.   Daneen Schick, BSW, CDP Social Worker, Certified Dementia Practitioner [place] [facility] [place] Management [phone]

## 2020-07-28 ENCOUNTER — Ambulatory Visit (INDEPENDENT_AMBULATORY_CARE_PROVIDER_SITE_OTHER): Payer: BC Managed Care – PPO | Admitting: Psychology

## 2020-07-28 DIAGNOSIS — F331 Major depressive disorder, recurrent, moderate: Secondary | ICD-10-CM

## 2020-08-01 ENCOUNTER — Encounter: Payer: BC Managed Care – PPO | Admitting: Nurse Practitioner

## 2020-08-02 ENCOUNTER — Encounter (INDEPENDENT_AMBULATORY_CARE_PROVIDER_SITE_OTHER): Payer: BC Managed Care – PPO | Admitting: Ophthalmology

## 2020-08-03 ENCOUNTER — Encounter (INDEPENDENT_AMBULATORY_CARE_PROVIDER_SITE_OTHER): Payer: BC Managed Care – PPO | Admitting: Ophthalmology

## 2020-08-08 ENCOUNTER — Encounter (INDEPENDENT_AMBULATORY_CARE_PROVIDER_SITE_OTHER): Payer: BC Managed Care – PPO | Admitting: Ophthalmology

## 2020-08-08 ENCOUNTER — Other Ambulatory Visit: Payer: Self-pay

## 2020-08-08 ENCOUNTER — Telehealth: Payer: BC Managed Care – PPO

## 2020-08-08 DIAGNOSIS — E113311 Type 2 diabetes mellitus with moderate nonproliferative diabetic retinopathy with macular edema, right eye: Secondary | ICD-10-CM | POA: Diagnosis not present

## 2020-08-08 DIAGNOSIS — E11311 Type 2 diabetes mellitus with unspecified diabetic retinopathy with macular edema: Secondary | ICD-10-CM

## 2020-08-08 DIAGNOSIS — H34812 Central retinal vein occlusion, left eye, with macular edema: Secondary | ICD-10-CM

## 2020-08-08 DIAGNOSIS — E113512 Type 2 diabetes mellitus with proliferative diabetic retinopathy with macular edema, left eye: Secondary | ICD-10-CM | POA: Diagnosis not present

## 2020-08-08 DIAGNOSIS — H43813 Vitreous degeneration, bilateral: Secondary | ICD-10-CM

## 2020-08-08 DIAGNOSIS — I10 Essential (primary) hypertension: Secondary | ICD-10-CM

## 2020-08-10 ENCOUNTER — Telehealth: Payer: BC Managed Care – PPO

## 2020-08-11 ENCOUNTER — Ambulatory Visit (INDEPENDENT_AMBULATORY_CARE_PROVIDER_SITE_OTHER): Payer: BC Managed Care – PPO | Admitting: Nurse Practitioner

## 2020-08-11 ENCOUNTER — Other Ambulatory Visit: Payer: Self-pay

## 2020-08-11 ENCOUNTER — Encounter: Payer: Self-pay | Admitting: Nurse Practitioner

## 2020-08-11 VITALS — BP 118/62 | HR 57 | Temp 98.6°F | Ht 60.6 in | Wt 200.2 lb

## 2020-08-11 DIAGNOSIS — Z23 Encounter for immunization: Secondary | ICD-10-CM | POA: Diagnosis not present

## 2020-08-11 DIAGNOSIS — I10 Essential (primary) hypertension: Secondary | ICD-10-CM

## 2020-08-11 DIAGNOSIS — E119 Type 2 diabetes mellitus without complications: Secondary | ICD-10-CM

## 2020-08-11 DIAGNOSIS — Z0000 Encounter for general adult medical examination without abnormal findings: Secondary | ICD-10-CM

## 2020-08-11 DIAGNOSIS — E559 Vitamin D deficiency, unspecified: Secondary | ICD-10-CM

## 2020-08-11 DIAGNOSIS — Z6838 Body mass index (BMI) 38.0-38.9, adult: Secondary | ICD-10-CM

## 2020-08-11 NOTE — Patient Instructions (Signed)
Health Maintenance, Female Adopting a healthy lifestyle and getting preventive care are important in promoting health and wellness. Ask your health care provider about:  The right schedule for you to have regular tests and exams.  Things you can do on your own to prevent diseases and keep yourself healthy. What should I know about diet, weight, and exercise? Eat a healthy diet   Eat a diet that includes plenty of vegetables, fruits, low-fat dairy products, and lean protein.  Do not eat a lot of foods that are high in solid fats, added sugars, or sodium. Maintain a healthy weight Body mass index (BMI) is used to identify weight problems. It estimates body fat based on height and weight. Your health care provider can help determine your BMI and help you achieve or maintain a healthy weight. Get regular exercise Get regular exercise. This is one of the most important things you can do for your health. Most adults should:  Exercise for at least 150 minutes each week. The exercise should increase your heart rate and make you sweat (moderate-intensity exercise).  Do strengthening exercises at least twice a week. This is in addition to the moderate-intensity exercise.  Spend less time sitting. Even light physical activity can be beneficial. Watch cholesterol and blood lipids Have your blood tested for lipids and cholesterol at 64 years of age, then have this test every 5 years. Have your cholesterol levels checked more often if:  Your lipid or cholesterol levels are high.  You are older than 64 years of age.  You are at high risk for heart disease. What should I know about cancer screening? Depending on your health history and family history, you may need to have cancer screening at various ages. This may include screening for:  Breast cancer.  Cervical cancer.  Colorectal cancer.  Skin cancer.  Lung cancer. What should I know about heart disease, diabetes, and high blood  pressure? Blood pressure and heart disease  High blood pressure causes heart disease and increases the risk of stroke. This is more likely to develop in people who have high blood pressure readings, are of African descent, or are overweight.  Have your blood pressure checked: ? Every 3-5 years if you are 18-39 years of age. ? Every year if you are 40 years old or older. Diabetes Have regular diabetes screenings. This checks your fasting blood sugar level. Have the screening done:  Once every three years after age 40 if you are at a normal weight and have a low risk for diabetes.  More often and at a younger age if you are overweight or have a high risk for diabetes. What should I know about preventing infection? Hepatitis B If you have a higher risk for hepatitis B, you should be screened for this virus. Talk with your health care provider to find out if you are at risk for hepatitis B infection. Hepatitis C Testing is recommended for:  Everyone born from 1945 through 1965.  Anyone with known risk factors for hepatitis C. Sexually transmitted infections (STIs)  Get screened for STIs, including gonorrhea and chlamydia, if: ? You are sexually active and are younger than 64 years of age. ? You are older than 64 years of age and your health care provider tells you that you are at risk for this type of infection. ? Your sexual activity has changed since you were last screened, and you are at increased risk for chlamydia or gonorrhea. Ask your health care provider if   you are at risk.  Ask your health care provider about whether you are at high risk for HIV. Your health care provider may recommend a prescription medicine to help prevent HIV infection. If you choose to take medicine to prevent HIV, you should first get tested for HIV. You should then be tested every 3 months for as long as you are taking the medicine. Pregnancy  If you are about to stop having your period (premenopausal) and  you may become pregnant, seek counseling before you get pregnant.  Take 400 to 800 micrograms (mcg) of folic acid every day if you become pregnant.  Ask for birth control (contraception) if you want to prevent pregnancy. Osteoporosis and menopause Osteoporosis is a disease in which the bones lose minerals and strength with aging. This can result in bone fractures. If you are 65 years old or older, or if you are at risk for osteoporosis and fractures, ask your health care provider if you should:  Be screened for bone loss.  Take a calcium or vitamin D supplement to lower your risk of fractures.  Be given hormone replacement therapy (HRT) to treat symptoms of menopause. Follow these instructions at home: Lifestyle  Do not use any products that contain nicotine or tobacco, such as cigarettes, e-cigarettes, and chewing tobacco. If you need help quitting, ask your health care provider.  Do not use street drugs.  Do not share needles.  Ask your health care provider for help if you need support or information about quitting drugs. Alcohol use  Do not drink alcohol if: ? Your health care provider tells you not to drink. ? You are pregnant, may be pregnant, or are planning to become pregnant.  If you drink alcohol: ? Limit how much you use to 0-1 drink a day. ? Limit intake if you are breastfeeding.  Be aware of how much alcohol is in your drink. In the [place] one drink equals one 12 oz bottle of beer (355 mL), one 5 oz glass of wine (148 mL), or one 1 oz glass of hard liquor (44 mL). General instructions  Schedule regular health, dental, and eye exams.  Stay current with your vaccines.  Tell your health care provider if: ? You often feel depressed. ? You have ever been abused or do not feel safe at home. Summary  Adopting a healthy lifestyle and getting preventive care are important in promoting health and wellness.  Follow your health care provider's instructions about healthy  diet, exercising, and getting tested or screened for diseases.  Follow your health care provider's instructions on monitoring your cholesterol and blood pressure. This information is not intended to replace advice given to you by your health care provider. Make sure you discuss any questions you have with your health care provider. Document Revised: 08/20/2018 Document Reviewed: 08/20/2018 Elsevier Patient Education  2020 [employer]  

## 2020-08-11 NOTE — Progress Notes (Signed)
I,Tianna Badgett,acting as a Education administrator for [employer], NP.,have documented all relevant documentation on the behalf of [employer], NP,as directed by  Bary Castilla, NP while in the presence of Bary Castilla, NP. This visit occurred during the SARS-CoV-2 public health emergency.  Safety protocols were in place, including screening questions prior to the visit, additional usage of staff PPE, and extensive cleaning of exam room while observing appropriate contact time as indicated for disinfecting solutions.  Subjective:     Patient ID: [name] , female    DOB: 1956-01-25 , 64 y.o.   MRN: [id number]   Chief Complaint  Patient presents with  . Annual Exam    HPI  Here for physical exam. She is followed by [facility] for her GYN care. She has had a hysterectomy. She has no concerns at this time. She reports compliance with medications.  She declines paps smear at this visit. She is not sexually active. She has not been for the past x 24 years ago.   Diet: she is doing better  Exercise: every now and then. Not in a routine.  Stress management: She is taking lexapro and doing good on it.   Wt Readings from Last 3 Encounters: 08/11/20 : 200 lb 3.2 oz (90.8 kg) 04/13/20 : 204 lb 12.8 oz (92.9 kg) 03/01/20 : 204 lb (92.5 kg)  Diabetes She presents for her follow-up diabetic visit. She has type 2 diabetes mellitus. Her disease course has been stable. Pertinent negatives for hypoglycemia include no confusion or headaches. Pertinent negatives for diabetes include no polydipsia, no polyphagia and no polyuria. Diabetic complications include retinopathy. Risk factors for coronary artery disease include sedentary lifestyle, obesity and hypertension. She is compliant with treatment most of the time. She is following a generally unhealthy diet. She has not had a previous visit with a dietitian. She rarely participates in exercise. Her breakfast blood glucose is taken  between 8-9 am. Her breakfast blood glucose range is generally 130-140 mg/dl. She does not see a podiatrist.Eye exam is current.  Hypertension This is a chronic problem. The current episode started more than 1 year ago. Pertinent negatives include no headaches, palpitations or shortness of breath. Risk factors for coronary artery disease include diabetes mellitus and smoking/tobacco exposure. Past treatments include calcium channel blockers. Hypertensive end-organ damage includes retinopathy.     Past Medical History:  Diagnosis Date  . Anemia 01/07/2020   REQUIRING TRANSFUSION  . Back pain   . Diabetes mellitus without complication ([place])   . GERD (gastroesophageal reflux disease)   . Hypertension   . Olecranon bursitis of left elbow      Family History  Problem Relation Age of Onset  . Diabetes Mother   . Hypertension Mother   . Diabetes Father   . Hypertension Father      Current Outpatient Medications:  .  Accu-Chek Softclix Lancets lancets, Use as instructed, Disp: 100 each, Rfl: 2 .  amLODipine (NORVASC) 5 MG tablet, TAKE 1 TABLET (5 MG TOTAL) BY MOUTH DAILY., Disp: 90 tablet, Rfl: 1 .  aspirin EC 325 MG EC tablet, Take 1 tablet (325 mg total) by mouth daily. Take x 1 month post op to decrease risk of blood clots. (Patient taking differently: Take 325 mg by mouth daily. ), Disp: 30 tablet, Rfl: 0 .  Blood Glucose Monitoring Suppl (ACCU-CHEK AVIVA) device, Use to check blood sugars daily E11.9, Disp: 1 each, Rfl: 0 .  Continuous Blood Gluc Receiver (FREESTYLE LIBRE READER) DEVI, 1  each by Does not apply route 4 (four) times daily., Disp: 1 Device, Rfl: 0 .  Continuous Blood Gluc Sensor ([place]) MISC, 1 each by Does not apply route 4 (four) times daily., Disp: 6 each, Rfl: 3 .  escitalopram (LEXAPRO) 10 MG tablet, Take 1 tablet (10 mg total) by mouth daily., Disp: 30 tablet, Rfl: 2 .  glucose blood (ACCU-CHEK AVIVA PLUS) test strip, Use as instructed, Disp:  100 each, Rfl: 2 .  Insulin Degludec-Liraglutide (XULTOPHY) 100-3.6 UNIT-MG/ML SOPN, Inject 15 Units into the skin daily., Disp: 15 mL, Rfl: 1 .  Insulin Pen Needle (BD PEN NEEDLE NANO U/F) 32G X 4 MM MISC, USE FOR 1 INJECTION DAILY, Disp: 100 each, Rfl: 1 .  valsartan (DIOVAN) 160 MG tablet, Take 1 tablet (160 mg total) by mouth daily., Disp: 90 tablet, Rfl: 1 .  Vitamin D, Ergocalciferol, (DRISDOL) 1.25 MG (50000 UNIT) CAPS capsule, TAKE 1 CAPSULE (50,000 UNITS TOTAL) BY MOUTH EVERY 7 (SEVEN) DAYS., Disp: 12 capsule, Rfl: 0 .  ferrous sulfate 325 (65 FE) MG tablet, Take 1 tablet (325 mg total) by mouth 2 (two) times daily with a meal., Disp: 60 tablet, Rfl: 1   No Known Allergies    The patient states she uses hysterotomy.  Last LMP was No LMP recorded. Patient has had a hysterectomy.. Negative for: breast discharge, breast lump(s), breast pain and breast self exam. Associated symptoms include abnormal vaginal bleeding. Pertinent negatives include abnormal bleeding (hematology), anxiety, decreased libido, depression, difficulty falling sleep, dyspareunia, history of infertility, nocturia, sexual dysfunction, sleep disturbances, urinary incontinence, urinary urgency, vaginal discharge and vaginal itching. Diet regular.The patient states her exercise level is    . The patient's tobacco use is: 10 cigrettes per day She is not sexually active.   Social History   Tobacco Use  Smoking Status Current Every Day Smoker  . Packs/day: 1.00  . Years: 30.00  . Pack years: 30.00  . Types: Cigarettes  Smokeless Tobacco Never Used  Tobacco Comment   she has chantix   . She has been exposed to passive smoke. The patient's alcohol use is:  Social History   Substance and Sexual Activity  Alcohol Use No   Comment: opccasionally   . Additional information: Last pap, next one scheduled for   Review of Systems  Constitutional: Negative.  Negative for fever.  HENT: Negative.  Negative for hearing loss.    Eyes: Negative.  Negative for pain.  Respiratory: Negative.  Negative for cough, shortness of breath and wheezing.   Cardiovascular: Negative.  Negative for palpitations and leg swelling.  Gastrointestinal: Negative.  Negative for diarrhea and nausea.  Endocrine: Negative.  Negative for polydipsia, polyphagia and polyuria.  Genitourinary: Negative.  Negative for frequency.  Musculoskeletal: Negative.  Negative for myalgias.  Skin: Negative.  Negative for rash.  Allergic/Immunologic: Negative.   Neurological: Negative.  Negative for headaches.  Hematological: Negative.   Psychiatric/Behavioral: Negative.  Negative for confusion.     Today's Vitals   08/11/20 0855  BP: 118/62  Pulse: (!) 57  Temp: 98.6 F (37 C)  TempSrc: Oral  Weight: 200 lb 3.2 oz (90.8 kg)  Height: 5' 0.6" (1.539 m)   Body mass index is 38.33 kg/m.  Wt Readings from Last 3 Encounters:  08/11/20 200 lb 3.2 oz (90.8 kg)  04/13/20 204 lb 12.8 oz (92.9 kg)  03/01/20 204 lb (92.5 kg)     Objective:  Physical Exam Constitutional:      Appearance:  Normal appearance. She is obese.  HENT:     Head: Normocephalic.     Right Ear: Tympanic membrane and ear canal normal. There is no impacted cerumen.     Left Ear: Tympanic membrane and ear canal normal. There is no impacted cerumen.     Nose:     Comments: Deferred. Patient masked.     Mouth/Throat:     Comments: Deferred. Patient masked.  Eyes:     Extraocular Movements: Extraocular movements intact.     Conjunctiva/sclera: Conjunctivae normal.     Pupils: Pupils are equal, round, and reactive to light.  Cardiovascular:     Rate and Rhythm: Normal rate and regular rhythm.     Pulses: Normal pulses.     Heart sounds: Normal heart sounds.  Pulmonary:     Effort: Pulmonary effort is normal. No respiratory distress.     Breath sounds: Normal breath sounds. No wheezing.  Abdominal:     General: Bowel sounds are normal.     Palpations: Abdomen is soft.      Tenderness: There is no abdominal tenderness. There is no guarding.  Genitourinary:    Comments: Deferred. Patient declined.  Musculoskeletal:        General: No tenderness.     Cervical back: Normal range of motion.     Right lower leg: No edema.     Left lower leg: No edema.     Comments: Uses a sitting rolling walker.   Skin:    General: Skin is warm and dry.     Capillary Refill: Capillary refill takes less than 2 seconds.     Findings: Rash present.     Comments: Rash to bilateral feet   Neurological:     Mental Status: She is alert and oriented to person, place, and time.     Motor: No weakness.     Gait: Gait normal.  Psychiatric:        Mood and Affect: Mood normal.        Behavior: Behavior normal.        Thought Content: Thought content normal.         Assessment And Plan:     1. Health maintenance examination - CBC - Hemoglobin A1c - CMP14+EGFR - Lipid panel -behavior modifications discussed and diet history reviewed with patient  -Pt. Was educated on the importance taking vitamins, cancer screening and immunization  -Recommended mammogram and colonoscopy for preventive screenings.  2. Essential hypertension -Chronic, stable on medication -Educated patient on avoiding processed food along with decreasing amounts of salt intake.  -CMP and renal function ordered  -Importance of exercise and dietary modification was stressed to the patient.  -Stressed importance of weight loss which will help with cardiac and cancer risks.   3. Type 2 diabetes mellitus without complication, without long-term current use of insulin (HCC) -Chronic, fairly controlled  -Continue taking current meds.  -Encouraged decrease sugary foods and drinks  -Encouraged exercise for 150 min. 4-5 days a week.  -Patient could not urinate in office today to provide [place] with a UA for dipstick and microalbumin.  -Hmg A1c   4. Vitamin D deficiency - VITAMIN D 25 Hydroxy (Vit-D Deficiency,  Fractures) -Encouraged patient to sit out in the sun and take vitamin D  -Will check vitamin D today   5. Class 2 severe obesity due to excess calories with serious comorbidity and body mass index (BMI) of 38.0 to 38.9 in adult [facility]) -Educated patient to maintain a  healthy lifestyle and exercise 4-5 days weekly.  -Avoid high fatty foods and increase intake of greens, vegetables and fruits.    6. Need for influenza vaccination - Flu Vaccine QUAD 6+ mos PF IM (Fluarix Quad PF) She is encouraged to strive for BMI less than 30 to decrease cardiac risk. Advised to aim for at least 150 minutes of exercise per week. -Given in office today.   Patient has not had the colonoscopy or have done the Cologuard even though she was referred and given both. She was educated about the importance of cancer screening and quitting tobacco.   Patient was given opportunity to ask questions. Patient verbalized understanding of the plan and was able to repeat key elements of the plan. All questions were answered to their satisfaction.   Bary Castilla, NP   I, Bary Castilla, NP, have reviewed all documentation for this visit. The documentation on 08/11/20 for the exam, diagnosis, procedures, and orders are all accurate and complete.  THE PATIENT IS ENCOURAGED TO PRACTICE SOCIAL DISTANCING DUE TO THE COVID-19 PANDEMIC.

## 2020-08-12 LAB — CBC
Hematocrit: 38.7 % (ref 34.0–46.6)
Hemoglobin: 11.6 g/dL (ref 11.1–15.9)
MCH: 23.1 pg — ABNORMAL LOW (ref 26.6–33.0)
MCHC: 30 g/dL — ABNORMAL LOW (ref 31.5–35.7)
MCV: 77 fL — ABNORMAL LOW (ref 79–97)
Platelets: 327 10*3/uL (ref 150–450)
RBC: 5.03 x10E6/uL (ref 3.77–5.28)
RDW: 17.2 % — ABNORMAL HIGH (ref 11.7–15.4)
WBC: 13.3 10*3/uL — ABNORMAL HIGH (ref 3.4–10.8)

## 2020-08-12 LAB — CMP14+EGFR
ALT: 14 IU/L (ref 0–32)
AST: 12 IU/L (ref 0–40)
Albumin/Globulin Ratio: 1.5 (ref 1.2–2.2)
Albumin: 4 g/dL (ref 3.8–4.8)
Alkaline Phosphatase: 90 IU/L (ref 44–121)
BUN/Creatinine Ratio: 19 (ref 12–28)
BUN: 20 mg/dL (ref 8–27)
Bilirubin Total: <0.2 mg/dL (ref 0.0–1.2)
CO2: 22 mmol/L (ref 20–29)
Calcium: 9.4 mg/dL (ref 8.7–10.3)
Chloride: 107 mmol/L — ABNORMAL HIGH (ref 96–106)
Creatinine, Ser: 1.06 mg/dL — ABNORMAL HIGH (ref 0.57–1.00)
GFR calc Af Amer: 64 mL/min/{1.73_m2} (ref >59)
GFR calc non Af Amer: 56 mL/min/{1.73_m2} — ABNORMAL LOW (ref >59)
Globulin, Total: 2.7 g/dL (ref 1.5–4.5)
Glucose: 86 mg/dL (ref 65–99)
Potassium: 4.5 mmol/L (ref 3.5–5.2)
Sodium: 143 mmol/L (ref 134–144)
Total Protein: 6.7 g/dL (ref 6.0–8.5)

## 2020-08-12 LAB — LIPID PANEL
Chol/HDL Ratio: 4.4 ratio (ref 0.0–4.4)
Cholesterol, Total: 173 mg/dL (ref 100–199)
HDL: 39 mg/dL — ABNORMAL LOW (ref >39)
LDL Chol Calc (NIH): 112 mg/dL — ABNORMAL HIGH (ref 0–99)
Triglycerides: 124 mg/dL (ref 0–149)
VLDL Cholesterol Cal: 22 mg/dL (ref 5–40)

## 2020-08-12 LAB — HEMOGLOBIN A1C
Est. average glucose Bld gHb Est-mCnc: 143 mg/dL
Hgb A1c MFr Bld: 6.6 % — ABNORMAL HIGH (ref 4.8–5.6)

## 2020-08-12 LAB — VITAMIN D 25 HYDROXY (VIT D DEFICIENCY, FRACTURES): Vit D, 25-Hydroxy: 26.8 ng/mL — ABNORMAL LOW (ref 30.0–100.0)

## 2020-08-15 ENCOUNTER — Other Ambulatory Visit: Payer: Self-pay | Admitting: Nurse Practitioner

## 2020-08-15 DIAGNOSIS — E559 Vitamin D deficiency, unspecified: Secondary | ICD-10-CM

## 2020-08-15 MED ORDER — VITAMIN D (ERGOCALCIFEROL) 1.25 MG (50000 UNIT) PO CAPS: ORAL_CAPSULE | ORAL | 0 refills | Status: DC

## 2020-08-15 NOTE — Progress Notes (Signed)
Good afternoon. Your Hgb A1c is better than what it was 4 months ago at 6.6. Please continue to eat a low carb diet, avoid sugary foods and drinks. Increase your intake of lean meats. Avoid foods high in fat. Avoid fast food and fried foods. If it continues to be elevated we may need to put you on medication. You should also drink plenty of water. Your LDL is slightly elevated at 112. We want that below 100. Avoid foods that are high in fat and dairy products that are high in fats. Exercise 4-5 times a week for atleast 30-45 min. Your Vit D is also decreased at 27. Are you taking your vitamin D every week? I have also sent you a refill for that. Let me know if you have any questions.

## 2020-08-17 ENCOUNTER — Other Ambulatory Visit: Payer: Self-pay | Admitting: Nurse Practitioner

## 2020-08-17 DIAGNOSIS — F32A Depression, unspecified: Secondary | ICD-10-CM

## 2020-08-22 ENCOUNTER — Telehealth: Payer: BC Managed Care – PPO

## 2020-08-22 ENCOUNTER — Telehealth: Payer: Self-pay

## 2020-08-22 NOTE — Telephone Encounter (Signed)
°  Chronic Care Management   Outreach Note  08/22/2020 Name: ADJOA ALTHOUSE MRN: [id number] DOB: 07-15-1956  Referred by: Arnette Felts, FNP Reason for referral : Care Coordination   CHENOAH MCNALLY is enrolled in a Managed Medicaid Health Plan: No  An unsuccessful telephone outreach was attempted today. The patient was referred to the case management team for assistance with care management and care coordination.   Follow Up Plan: A HIPAA compliant phone message was left for the patient providing contact information and requesting a return call.  The care management team will reach out to the patient again over the next 30 days.   Bevelyn Ngo, BSW, CDP Social Worker, Certified Dementia Practitioner [facility] Care Management [phone]

## 2020-08-31 ENCOUNTER — Ambulatory Visit: Payer: BC Managed Care – PPO | Admitting: Psychology

## 2020-09-12 ENCOUNTER — Other Ambulatory Visit: Payer: Self-pay

## 2020-09-12 ENCOUNTER — Encounter (INDEPENDENT_AMBULATORY_CARE_PROVIDER_SITE_OTHER): Payer: BC Managed Care – PPO | Admitting: Ophthalmology

## 2020-09-12 DIAGNOSIS — E113512 Type 2 diabetes mellitus with proliferative diabetic retinopathy with macular edema, left eye: Secondary | ICD-10-CM

## 2020-09-12 DIAGNOSIS — H34812 Central retinal vein occlusion, left eye, with macular edema: Secondary | ICD-10-CM | POA: Diagnosis not present

## 2020-09-12 DIAGNOSIS — H35033 Hypertensive retinopathy, bilateral: Secondary | ICD-10-CM

## 2020-09-12 DIAGNOSIS — H43813 Vitreous degeneration, bilateral: Secondary | ICD-10-CM

## 2020-09-12 DIAGNOSIS — I10 Essential (primary) hypertension: Secondary | ICD-10-CM | POA: Diagnosis not present

## 2020-09-12 DIAGNOSIS — E113311 Type 2 diabetes mellitus with moderate nonproliferative diabetic retinopathy with macular edema, right eye: Secondary | ICD-10-CM | POA: Diagnosis not present

## 2020-09-23 ENCOUNTER — Telehealth: Payer: BC Managed Care – PPO

## 2020-09-26 ENCOUNTER — Telehealth: Payer: BC Managed Care – PPO

## 2020-09-30 ENCOUNTER — Other Ambulatory Visit: Payer: Self-pay | Admitting: Nurse Practitioner

## 2020-10-04 ENCOUNTER — Telehealth: Payer: Self-pay

## 2020-10-04 ENCOUNTER — Telehealth: Payer: BC Managed Care – PPO

## 2020-10-04 NOTE — Telephone Encounter (Signed)
  Chronic Care Management   Outreach Note  10/04/2020 Name: [name] MRN: [id number] DOB: 06/07/1956  Referred by: Arnette Felts, FNP Reason for referral : Care Coordination   A second unsuccessful telephone outreach was attempted today. The patient was referred to the case management team for assistance with care management and care coordination.   Follow Up Plan: A HIPAA compliant phone message was left for the patient providing contact information and requesting a return call.  The care management team will reach out to the patient again over the next 21 days.   Bevelyn Ngo, BSW, CDP Social Worker, Certified Dementia Practitioner [facility] Care Management [phone]

## 2020-10-10 ENCOUNTER — Encounter (INDEPENDENT_AMBULATORY_CARE_PROVIDER_SITE_OTHER): Payer: BC Managed Care – PPO | Admitting: Ophthalmology

## 2020-10-19 ENCOUNTER — Encounter (INDEPENDENT_AMBULATORY_CARE_PROVIDER_SITE_OTHER): Payer: BC Managed Care – PPO | Admitting: Ophthalmology

## 2020-10-21 ENCOUNTER — Ambulatory Visit: Payer: BC Managed Care – PPO

## 2020-10-21 DIAGNOSIS — E119 Type 2 diabetes mellitus without complications: Secondary | ICD-10-CM

## 2020-10-21 DIAGNOSIS — I10 Essential (primary) hypertension: Secondary | ICD-10-CM

## 2020-10-21 NOTE — Chronic Care Management (AMB) (Signed)
Social Work Note  10/21/2020 Name: [name] MRN: [id number] DOB: September 13, 1955  [name] is a 65 y.o. year old female who is a primary care patient of [name], [place].  The Care Management team was consulted for assistance with chronic disease management and care coordination needs.  [name] was given information about Care Management services today including:  1. Care Management services include personalized support from designated clinical staff supervised by her physician, including individualized plan of care and coordination with other care providers 2. 24/7 contact phone numbers for assistance for urgent and routine care needs. 3. The patient may stop care management services at any time (effective at the end of the month) by phone call to the office staff.  Patient agreed to services and consent obtained.   Engaged with patient by telephone for follow up visit in response to provider referral for social work chronic care management and care coordination services.  Assessment: Review of patient past medical history, allergies, medications, and health status, including review of pertinent consultant reports was performed as part of comprehensive evaluation and provision of care management/care coordination services.   SDOH (Social Determinants of Health) assessments and interventions performed:  No    Advanced Directives Status: Not addressed in this encounter.  Care Plan  No Known Allergies  Outpatient Encounter Medications as of 10/21/2020  Medication Sig  . Accu-Chek Softclix Lancets lancets Use as instructed  . amLODipine (NORVASC) 5 MG tablet TAKE 1 TABLET (5 MG TOTAL) BY MOUTH DAILY.  [name] aspirin EC 325 MG EC tablet Take 1 tablet (325 mg total) by mouth daily. Take x 1 month post op to decrease risk of blood clots. (Patient taking differently: Take 325 mg by mouth daily. )  . Blood Glucose Monitoring Suppl (ACCU-CHEK AVIVA) device Use to check blood  sugars daily E11.9  . Continuous Blood Gluc Receiver (FREESTYLE LIBRE READER) DEVI 1 each by Does not apply route 4 (four) times daily.  . Continuous Blood Gluc Sensor (FREESTYLE LIBRE SENSOR SYSTEM) MISC 1 each by Does not apply route 4 (four) times daily.  [name] escitalopram (LEXAPRO) 10 MG tablet TAKE 1 TABLET (10 MG TOTAL) BY MOUTH DAILY.  . ferrous sulfate 325 (65 FE) MG tablet Take 1 tablet (325 mg total) by mouth 2 (two) times daily with a meal.  . glucose blood (ACCU-CHEK AVIVA PLUS) test strip Use as instructed  . Insulin Pen Needle (BD PEN NEEDLE NANO U/F) 32G X 4 MM MISC USE FOR 1 INJECTION DAILY  . valsartan (DIOVAN) 160 MG tablet Take 1 tablet (160 mg total) by mouth daily.  . Vitamin D, Ergocalciferol, (DRISDOL) 1.25 MG (50000 UNIT) CAPS capsule TAKE 1 CAPSULE (50,000 UNITS TOTAL) BY MOUTH EVERY 7 (SEVEN) DAYS.  [name] XULTOPHY 100-3.6 UNIT-MG/ML SOPN INJECT 15 UNITS INTO THE SKIN DAILY  LAST FOR 100 DAYS    No facility-administered encounter medications on file as of 10/21/2020.    Patient Active Problem List   Diagnosis Date Noted  . CMV (cytomegalovirus) antibody positive 03/02/2020  . Iron deficiency anemia 01/08/2020  . 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 Chronic back pain 01/07/2020  . Tobacco dependence 01/07/2020  . Hypertensive retinopathy of both eyes 02/18/2019  . Combined form of age-related cataract, both eyes 02/18/2019  . Closed displaced oblique fracture of shaft of left humerus 12/17/2018  . Insomnia 06/08/2018  . Essential hypertension 06/08/2018  . Olecranon bursitis, left elbow 06/19/2017  . Closed fracture of distal end of right  fibula and tibia 06/10/2016  . Diabetes ([place]) 06/10/2016  . Traumatic closed displaced fracture of shaft of right tibia and fibula 06/10/2016    Conditions to be addressed/monitored: HTN and DMII  Goals Addressed            This Visit's Progress   . COMPLETED: Collaborate with RN Care Manager to perform approrpiate  assessments to assist with care management and care coordination needs       CARE PLAN ENTRY (see longitudinal plan of care for additional care plan information)  Current Barriers:  . Financial constraints related to cost of transportation . Transportation . Chronic conditions including DM II, HTN, and Depression which put patient at increased risk for hospitalization  Social Work Clinical Goal(s):  [name] Over the next 5 days the patient will attend her eye specialist appointment Goal Met . Over the next 30 days the patient will obtain and administer new medication as prescribed by her primary provider . Over the next 90 days the patient will work with SW to address ongoing care coordination needs  CCM SW Interventions: . Successful outbound call placed to the patient to assess for care coordination needs . Determined the patient would like to apply for disability and/or Medicaid  . Reviewed with the patient that she is already receiving social security therefore she is not eligible for disability . Discussed the patient has a monthly income to include social security retirement and her pension from teaching which puts patient well over the income limit to qualify for Medicaid . Patient indicates her specialists co-pays are expensive but understands she does not qualify for Medicaid or Disability . Encouraged the patient to contact SW as needed with any future SW needs  Patient Self Care Activities:  . Patient verbalizes understanding of plan to contact SW as needed prior to next scheduled call . Self administers medications as prescribed . Calls pharmacy for medication refills . Performs ADL's independently . Performs IADL's independently  Please see past updates related to this goal by clicking on the "Past Updates" button in the selected goal           Follow Up Plan: No follow up planned at this time. The patient is encouraged to contact SW as needed.      Daneen Schick, BSW,  CDP Social Worker, Certified Dementia Practitioner [facility] [place] Management [phone]

## 2020-10-26 ENCOUNTER — Other Ambulatory Visit: Payer: Self-pay | Admitting: Nurse Practitioner

## 2020-10-31 ENCOUNTER — Telehealth: Payer: Self-pay

## 2020-10-31 NOTE — Telephone Encounter (Signed)
  Chronic Care Management   Outreach Note  10/31/2020 Name: [name] MRN: [id number] DOB: 01-03-1956  Referred by: Arnette Felts, FNP Reason for referral : Care Coordination (RN CC FU Call Attempt)   An unsuccessful telephone outreach was attempted today. The patient was referred to the case management team for assistance with care management and care coordination.   Follow Up Plan: A HIPAA compliant phone message was left for the patient providing contact information and requesting a return call.  Telephone follow up appointment with care management team member scheduled for: 12/12/20  Delsa Sale, RN, BSN, CCM Care Management Coordinator [facility] Care Management/[facility]  Direct Phone: [phone]

## 2020-11-04 ENCOUNTER — Other Ambulatory Visit: Payer: Self-pay | Admitting: Nurse Practitioner

## 2020-11-07 ENCOUNTER — Encounter (INDEPENDENT_AMBULATORY_CARE_PROVIDER_SITE_OTHER): Payer: BC Managed Care – PPO | Admitting: Ophthalmology

## 2020-11-08 ENCOUNTER — Encounter (INDEPENDENT_AMBULATORY_CARE_PROVIDER_SITE_OTHER): Payer: BC Managed Care – PPO | Admitting: Ophthalmology

## 2020-11-09 ENCOUNTER — Ambulatory Visit: Payer: BC Managed Care – PPO | Admitting: Nurse Practitioner

## 2020-11-11 ENCOUNTER — Encounter (INDEPENDENT_AMBULATORY_CARE_PROVIDER_SITE_OTHER): Payer: BC Managed Care – PPO | Admitting: Ophthalmology

## 2020-11-11 ENCOUNTER — Other Ambulatory Visit: Payer: Self-pay

## 2020-11-11 DIAGNOSIS — H35033 Hypertensive retinopathy, bilateral: Secondary | ICD-10-CM

## 2020-11-11 DIAGNOSIS — E113512 Type 2 diabetes mellitus with proliferative diabetic retinopathy with macular edema, left eye: Secondary | ICD-10-CM | POA: Diagnosis not present

## 2020-11-11 DIAGNOSIS — E113311 Type 2 diabetes mellitus with moderate nonproliferative diabetic retinopathy with macular edema, right eye: Secondary | ICD-10-CM

## 2020-11-11 DIAGNOSIS — I10 Essential (primary) hypertension: Secondary | ICD-10-CM

## 2020-11-11 DIAGNOSIS — H34812 Central retinal vein occlusion, left eye, with macular edema: Secondary | ICD-10-CM

## 2020-11-11 DIAGNOSIS — H43813 Vitreous degeneration, bilateral: Secondary | ICD-10-CM

## 2020-11-11 DIAGNOSIS — H2513 Age-related nuclear cataract, bilateral: Secondary | ICD-10-CM

## 2020-11-16 ENCOUNTER — Encounter: Payer: Self-pay | Admitting: Nurse Practitioner

## 2020-11-16 ENCOUNTER — Ambulatory Visit: Payer: BC Managed Care – PPO | Admitting: Nurse Practitioner

## 2020-11-16 ENCOUNTER — Other Ambulatory Visit: Payer: Self-pay

## 2020-11-16 VITALS — BP 130/68 | HR 65 | Temp 97.9°F | Ht 60.6 in | Wt 195.0 lb

## 2020-11-16 DIAGNOSIS — E559 Vitamin D deficiency, unspecified: Secondary | ICD-10-CM | POA: Diagnosis not present

## 2020-11-16 DIAGNOSIS — I10 Essential (primary) hypertension: Secondary | ICD-10-CM | POA: Diagnosis not present

## 2020-11-16 DIAGNOSIS — Z79899 Other long term (current) drug therapy: Secondary | ICD-10-CM

## 2020-11-16 DIAGNOSIS — I2510 Atherosclerotic heart disease of native coronary artery without angina pectoris: Secondary | ICD-10-CM | POA: Diagnosis not present

## 2020-11-16 DIAGNOSIS — F3289 Other specified depressive episodes: Secondary | ICD-10-CM

## 2020-11-16 DIAGNOSIS — Z1211 Encounter for screening for malignant neoplasm of colon: Secondary | ICD-10-CM

## 2020-11-16 DIAGNOSIS — E119 Type 2 diabetes mellitus without complications: Secondary | ICD-10-CM

## 2020-11-16 DIAGNOSIS — Z720 Tobacco use: Secondary | ICD-10-CM

## 2020-11-16 DIAGNOSIS — D509 Iron deficiency anemia, unspecified: Secondary | ICD-10-CM

## 2020-11-16 MED ORDER — BUPROPION HCL ER (XL) 150 MG PO TB24: 150.0000 mg | ORAL_TABLET | ORAL | 2 refills | Status: DC

## 2020-11-16 MED ORDER — ESCITALOPRAM OXALATE 10 MG PO TABS: 10.0000 mg | ORAL_TABLET | Freq: Every day | ORAL | 2 refills | Status: DC

## 2020-11-16 MED ORDER — ATORVASTATIN CALCIUM 10 MG PO TABS: 10.0000 mg | ORAL_TABLET | Freq: Every day | ORAL | 11 refills | Status: DC

## 2020-11-16 MED ORDER — AMLODIPINE BESYLATE 5 MG PO TABS: 5.0000 mg | ORAL_TABLET | Freq: Every day | ORAL | 1 refills | Status: DC

## 2020-11-16 NOTE — Progress Notes (Signed)
I,Yamilka Roman [employer] as a Education administrator for [employer], FNP.,have documented all relevant documentation on the behalf of [name], FNP,as directed by  [name], FNP while in the presence of [name], [place]. This visit occurred during the SARS-CoV-2 public health emergency.  Safety protocols were in place, including screening questions prior to the visit, additional usage of staff PPE, and extensive cleaning of exam room while observing appropriate contact time as indicated for disinfecting solutions.  Subjective:     Patient ID: [name] , female    DOB: 1955-10-22 , 65 y.o.   MRN: [id number]   Chief Complaint  Patient presents with   Hypertension   Diabetes    HPI  Patient presents today for a diabetes and blood pressure f/u  Wt Readings from Last 3 Encounters: 11/16/20 : 195 lb (88.5 kg) 08/11/20 : 200 lb 3.2 oz (90.8 kg) 04/13/20 : 204 lb 12.8 oz (92.9 kg)   Hypertension This is a chronic problem. The current episode started more than 1 year ago. The problem is uncontrolled. Pertinent negatives include no blurred vision, chest pain or headaches. There are no associated agents to hypertension. Risk factors for coronary artery disease include sedentary lifestyle, obesity and diabetes mellitus.  Diabetes She presents for her follow-up diabetic visit. She has type 2 diabetes mellitus. Her disease course has been worsening. There are no hypoglycemic associated symptoms. Pertinent negatives for hypoglycemia include no dizziness or headaches. Pertinent negatives for diabetes include no blurred vision, no chest pain, no fatigue, no polydipsia, no polyphagia and no polyuria. There are no hypoglycemic complications. Symptoms are improving. There are no diabetic complications. Risk factors for coronary artery disease include sedentary lifestyle and obesity. Current diabetic treatment includes oral agent (dual therapy) (20 units daily). She is compliant with treatment most  of the time. Her weight is stable. She is following a generally healthy diet. When asked about meal planning, she reported none. She has not had a previous visit with a dietitian. She rarely participates in exercise. (Averaging 120's) An ACE inhibitor/angiotensin II receptor blocker is not being taken. Eye exam is not current (she is having eye treatment with Dr. Zigmund Daniel for cataract, Dr.Groat is her opthalmologist).     Past Medical History:  Diagnosis Date   Anemia 01/07/2020   REQUIRING TRANSFUSION   Back pain    Diabetes mellitus without complication (HCC)    GERD (gastroesophageal reflux disease)    Hypertension    Olecranon bursitis of left elbow      Family History  Problem Relation Age of Onset   Diabetes Mother    Hypertension Mother    Diabetes Father    Hypertension Father      Current Outpatient Medications:    aspirin EC 325 MG EC tablet, Take 1 tablet (325 mg total) by mouth daily. Take x 1 month post op to decrease risk of blood clots. (Patient taking differently: Take 325 mg by mouth daily.), Disp: 30 tablet, Rfl: 0   atorvastatin (LIPITOR) 10 MG tablet, Take 1 tablet (10 mg total) by mouth daily., Disp: 30 tablet, Rfl: 11   Blood Glucose Monitoring Suppl (ACCU-CHEK AVIVA) device, Use to check blood sugars daily E11.9, Disp: 1 each, Rfl: 0   buPROPion (WELLBUTRIN XL) 150 MG 24 hr tablet, Take 1 tablet (150 mg total) by mouth every morning., Disp: 30 tablet, Rfl: 2   ferrous sulfate 325 (65 FE) MG tablet, Take 1 tablet (325 mg total) by mouth 2 (two) times daily with  a meal., Disp: 60 tablet, Rfl: 1   glucose blood (ACCU-CHEK AVIVA PLUS) test strip, Use as instructed, Disp: 100 each, Rfl: 2   Insulin Pen Needle (BD PEN NEEDLE NANO U/F) 32G X 4 MM MISC, USE FOR 1 INJECTION DAILY, Disp: 100 each, Rfl: 1   Lancets (ONETOUCH DELICA PLUS [id number]) MISC, USE TO CHECK BLOOD SUGAR ONCE A DAY AS INSTRUCTED, Disp: 100 each, Rfl: 2   valsartan (DIOVAN) 160 MG  tablet, TAKE 1 TABLET (160 MG TOTAL) BY MOUTH DAILY., Disp: 90 tablet, Rfl: 1   Vitamin D, Ergocalciferol, (DRISDOL) 1.25 MG (50000 UNIT) CAPS capsule, TAKE 1 CAPSULE (50,000 UNITS TOTAL) BY MOUTH EVERY 7 (SEVEN) DAYS., Disp: 12 capsule, Rfl: 0   amLODipine (NORVASC) 5 MG tablet, Take 1 tablet (5 mg total) by mouth daily., Disp: 90 tablet, Rfl: 1   Insulin Degludec-Liraglutide (XULTOPHY) 100-3.6 UNIT-MG/ML SOPN, Inject 15 Units into the skin daily., Disp: 15 mL, Rfl: 1   No Known Allergies   Review of Systems  Constitutional: Negative for fatigue.  Eyes: Negative for blurred vision.  Cardiovascular: Negative for chest pain.  Endocrine: Negative for polydipsia, polyphagia and polyuria.  Neurological: Negative for dizziness and headaches.     Today's Vitals   11/16/20 1547  BP: 130/68  Pulse: 65  Temp: 97.9 F (36.6 C)  TempSrc: Oral  Weight: 195 lb (88.5 kg)  Height: 5' 0.6" (1.539 m)  PainSc: 7   PainLoc: Back   Body mass index is 37.33 kg/m.   Objective:  Physical Exam Constitutional:      Appearance: Normal appearance. She is obese.  Cardiovascular:     Rate and Rhythm: Normal rate and regular rhythm.     Pulses: Normal pulses.     Heart sounds: Normal heart sounds.  Pulmonary:     Effort: Pulmonary effort is normal.     Breath sounds: Normal breath sounds.  Abdominal:     General: Abdomen is flat. Bowel sounds are normal.     Palpations: Abdomen is soft.  Musculoskeletal:        General: Normal range of motion.     Cervical back: Normal range of motion and neck supple.  Skin:    General: Skin is warm and dry.     Capillary Refill: Capillary refill takes less than 2 seconds.  Neurological:     General: No focal deficit present.     Mental Status: She is alert and oriented to person, place, and time.  Psychiatric:        Mood and Affect: Mood normal.        Behavior: Behavior normal.        Thought Content: Thought content normal.        Judgment: Judgment  normal.         Assessment And Plan:     1. Type 2 diabetes mellitus without complication, without long-term current use of insulin (HCC)  Chronic, improving  Continue with current medications  Encouraged to limit intake of sugary foods and drinks  Encouraged to increase physical activity to 150 minutes per week as tolerated - CMP14+EGFR - Hemoglobin A1c  2. Essential hypertension  B/P is fairly controlled.   CMP ordered to check renal function.   The importance of regular exercise and dietary modification was stressed to the patient.   Stressed importance of losing ten percent of her body weight to help with B/P control.  - amLODipine (NORVASC) 5 MG tablet; Take 1 tablet (5 mg total)  by mouth daily.  Dispense: 90 tablet; Refill: 1 - CMP14+EGFR  3. Vitamin D deficiency  Will check vitamin D level and supplement as needed.     Also encouraged to spend 15 minutes in the sun daily.   4. Atherosclerotic cardiovascular disease  Chronic, she is tolerating statin well - atorvastatin (LIPITOR) 10 MG tablet; Take 1 tablet (10 mg total) by mouth daily.  Dispense: 30 tablet; Refill: 11  5. Other depression She is willing to start an antidepressant She is advised if she has different thoughts to call the office  - buPROPion (WELLBUTRIN XL) 150 MG 24 hr tablet; Take 1 tablet (150 mg total) by mouth every morning.  Dispense: 30 tablet; Refill: 2  6. Tobacco abuse  She has a 30 year PPY history and currently continues to smoke cigarettes - CT CHEST LUNG CA SCREEN LOW DOSE W/O CM; Future  7. Iron deficiency anemia, unspecified iron deficiency anemia type  Chronic, will check iron levels - CMP14+EGFR - Iron, TIBC and Ferritin Panel  8. Other long term (current) drug therapy - CBC  9. Encounter for screening colonoscopy  According to USPTF Colorectal cancer Screening guidelines. Colonoscopy is recommended every 10 years, starting at age 66 years.  Will refer to GI for  colon cancer screening. - Ambulatory referral to Gastroenterology     Patient was given opportunity to ask questions. Patient verbalized understanding of the plan and was able to repeat key elements of the plan. All questions were answered to their satisfaction.  [name], FNP   I, [name], FNP, have reviewed all documentation for this visit. The documentation on 11/27/20 for the exam, diagnosis, procedures, and orders are all accurate and complete.  THE PATIENT IS ENCOURAGED TO PRACTICE SOCIAL DISTANCING DUE TO THE COVID-19 PANDEMIC.

## 2020-11-16 NOTE — Patient Instructions (Signed)

## 2020-11-17 LAB — HEMOGLOBIN A1C
Est. average glucose Bld gHb Est-mCnc: 134 mg/dL
Hgb A1c MFr Bld: 6.3 % — ABNORMAL HIGH (ref 4.8–5.6)

## 2020-11-17 LAB — CMP14+EGFR
ALT: 10 IU/L (ref 0–32)
AST: 15 IU/L (ref 0–40)
Albumin/Globulin Ratio: 1.6 (ref 1.2–2.2)
Albumin: 4.2 g/dL (ref 3.8–4.8)
Alkaline Phosphatase: 100 IU/L (ref 44–121)
BUN/Creatinine Ratio: 25 (ref 12–28)
BUN: 20 mg/dL (ref 8–27)
Bilirubin Total: 0.2 mg/dL (ref 0.0–1.2)
CO2: 23 mmol/L (ref 20–29)
Calcium: 9.5 mg/dL (ref 8.7–10.3)
Chloride: 103 mmol/L (ref 96–106)
Creatinine, Ser: 0.79 mg/dL (ref 0.57–1.00)
Globulin, Total: 2.7 g/dL (ref 1.5–4.5)
Glucose: 73 mg/dL (ref 65–99)
Potassium: 4 mmol/L (ref 3.5–5.2)
Sodium: 141 mmol/L (ref 134–144)
Total Protein: 6.9 g/dL (ref 6.0–8.5)
eGFR: 83 mL/min/{1.73_m2} (ref >59)

## 2020-11-17 LAB — IRON,TIBC AND FERRITIN PANEL
Ferritin: 74 ng/mL (ref 15–150)
Iron Saturation: 14 % — ABNORMAL LOW (ref 15–55)
Iron: 41 ug/dL (ref 27–139)
Total Iron Binding Capacity: 283 ug/dL (ref 250–450)
UIBC: 242 ug/dL (ref 118–369)

## 2020-11-17 LAB — CBC
Hematocrit: 38.7 % (ref 34.0–46.6)
Hemoglobin: 12.1 g/dL (ref 11.1–15.9)
MCH: 22.8 pg — ABNORMAL LOW (ref 26.6–33.0)
MCHC: 31.3 g/dL — ABNORMAL LOW (ref 31.5–35.7)
MCV: 73 fL — ABNORMAL LOW (ref 79–97)
Platelets: 346 10*3/uL (ref 150–450)
RBC: 5.3 x10E6/uL — ABNORMAL HIGH (ref 3.77–5.28)
RDW: 19.7 % — ABNORMAL HIGH (ref 11.7–15.4)
WBC: 12.6 10*3/uL — ABNORMAL HIGH (ref 3.4–10.8)

## 2020-11-27 MED ORDER — XULTOPHY 100-3.6 UNIT-MG/ML ~~LOC~~ SOPN
15.0000 [IU] | PEN_INJECTOR | Freq: Every day | SUBCUTANEOUS | 1 refills | Status: DC
Start: 2020-11-27 — End: 2021-05-01

## 2020-12-07 ENCOUNTER — Other Ambulatory Visit: Payer: Self-pay

## 2020-12-07 ENCOUNTER — Encounter (INDEPENDENT_AMBULATORY_CARE_PROVIDER_SITE_OTHER): Payer: BC Managed Care – PPO | Admitting: Ophthalmology

## 2020-12-07 DIAGNOSIS — I10 Essential (primary) hypertension: Secondary | ICD-10-CM | POA: Diagnosis not present

## 2020-12-07 DIAGNOSIS — E113311 Type 2 diabetes mellitus with moderate nonproliferative diabetic retinopathy with macular edema, right eye: Secondary | ICD-10-CM | POA: Diagnosis not present

## 2020-12-07 DIAGNOSIS — E113512 Type 2 diabetes mellitus with proliferative diabetic retinopathy with macular edema, left eye: Secondary | ICD-10-CM | POA: Diagnosis not present

## 2020-12-07 DIAGNOSIS — H43813 Vitreous degeneration, bilateral: Secondary | ICD-10-CM

## 2020-12-07 DIAGNOSIS — H2513 Age-related nuclear cataract, bilateral: Secondary | ICD-10-CM

## 2020-12-07 DIAGNOSIS — H35033 Hypertensive retinopathy, bilateral: Secondary | ICD-10-CM

## 2020-12-07 DIAGNOSIS — H34812 Central retinal vein occlusion, left eye, with macular edema: Secondary | ICD-10-CM | POA: Diagnosis not present

## 2020-12-12 ENCOUNTER — Telehealth: Payer: BC Managed Care – PPO

## 2020-12-14 ENCOUNTER — Ambulatory Visit: Payer: BC Managed Care – PPO | Admitting: Nurse Practitioner

## 2020-12-21 ENCOUNTER — Encounter: Payer: Self-pay | Admitting: Nurse Practitioner

## 2020-12-21 ENCOUNTER — Ambulatory Visit: Payer: BC Managed Care – PPO | Admitting: Nurse Practitioner

## 2020-12-21 ENCOUNTER — Other Ambulatory Visit: Payer: Self-pay

## 2020-12-21 VITALS — BP 118/62 | HR 63 | Temp 98.5°F | Ht 60.6 in | Wt 189.0 lb

## 2020-12-21 DIAGNOSIS — Z1231 Encounter for screening mammogram for malignant neoplasm of breast: Secondary | ICD-10-CM

## 2020-12-21 DIAGNOSIS — E119 Type 2 diabetes mellitus without complications: Secondary | ICD-10-CM | POA: Diagnosis not present

## 2020-12-21 DIAGNOSIS — M545 Low back pain, unspecified: Secondary | ICD-10-CM | POA: Diagnosis not present

## 2020-12-21 DIAGNOSIS — I10 Essential (primary) hypertension: Secondary | ICD-10-CM | POA: Diagnosis not present

## 2020-12-21 DIAGNOSIS — Z6836 Body mass index (BMI) 36.0-36.9, adult: Secondary | ICD-10-CM

## 2020-12-21 LAB — POCT URINALYSIS DIPSTICK
Blood, UA: NEGATIVE
Glucose, UA: NEGATIVE
Ketones, UA: POSITIVE
Leukocytes, UA: NEGATIVE
Nitrite, UA: NEGATIVE
Protein, UA: POSITIVE — AB
Spec Grav, UA: 1.025 (ref 1.010–1.025)
Urobilinogen, UA: 1 E.U./dL
pH, UA: 5.5 (ref 5.0–8.0)

## 2020-12-21 NOTE — Patient Instructions (Signed)
Diabetes Mellitus and Exercise Exercising regularly is important for overall health, especially for people who have diabetes mellitus. Exercising is not only about losing weight. It has many other health benefits, such as increasing muscle strength and bone density and reducing body fat and stress. This leads to improved fitness, flexibility, and endurance, all of which result in better overall health. What are the benefits of exercise if I have diabetes? Exercise has many benefits for people with diabetes. They include:  Helping to lower and control blood sugar (glucose).  Helping the body to respond better to the hormone insulin by improving insulin sensitivity.  Reducing how much insulin the body needs.  Lowering the risk for heart disease by: ? Lowering "bad" cholesterol and triglyceride levels. ? Increasing "good" cholesterol levels. ? Lowering blood pressure. ? Lowering blood glucose levels. What is my activity plan? Your health care provider or certified diabetes educator can help you make a plan for the type and frequency of exercise that works for you. This is called your activity plan. Be sure to:  Get at least 150 minutes of medium-intensity or high-intensity exercise each week. Exercises may include brisk walking, biking, or water aerobics.  Do stretching and strengthening exercises, such as yoga or weight lifting, at least 2 times a week.  Spread out your activity over at least 3 days of the week.  Get some form of physical activity each day. ? Do not go more than 2 days in a row without some kind of physical activity. ? Avoid being inactive for more than 90 minutes at a time. Take frequent breaks to walk or stretch.  Choose exercises or activities that you enjoy. Set realistic goals.  Start slowly and gradually increase your exercise intensity over time.   How do I manage my diabetes during exercise? Monitor your blood glucose  Check your blood glucose before and  after exercising. If your blood glucose is: ? 240 mg/dL (13.3 mmol/L) or higher before you exercise, check your urine for ketones. These are chemicals created by the liver. If you have ketones in your urine, do not exercise until your blood glucose returns to normal. ? 100 mg/dL (5.6 mmol/L) or lower, eat a snack containing 15-20 grams of carbohydrate. Check your blood glucose 15 minutes after the snack to make sure that your glucose level is above 100 mg/dL (5.6 mmol/L) before you start your exercise.  Know the symptoms of low blood glucose (hypoglycemia) and how to treat it. Your risk for hypoglycemia increases during and after exercise. Follow these tips and your health care provider's instructions  Keep a carbohydrate snack that is fast-acting for use before, during, and after exercise to help prevent or treat hypoglycemia.  Avoid injecting insulin into areas of the body that are going to be exercised. For example, avoid injecting insulin into: ? Your arms, when you are about to play tennis. ? Your legs, when you are about to go jogging.  Keep records of your exercise habits. Doing this can help you and your health care provider adjust your diabetes management plan as needed. Write down: ? Food that you eat before and after you exercise. ? Blood glucose levels before and after you exercise. ? The type and amount of exercise you have done.  Work with your health care provider when you start a new exercise or activity. He or she may need to: ? Make sure that the activity is safe for you. ? Adjust your insulin, other medicines, and food that   you eat.  Drink plenty of water while you exercise. This prevents loss of water (dehydration) and problems caused by a lot of heat in the body (heat stroke).   Where to find more information  American Diabetes Association: www.diabetes.org Summary  Exercising regularly is important for overall health, especially for people who have diabetes  mellitus.  Exercising has many health benefits. It increases muscle strength and bone density and reduces body fat and stress. It also lowers and controls blood glucose.  Your health care provider or certified diabetes educator can help you make an activity plan for the type and frequency of exercise that works for you.  Work with your health care provider to make sure any new activity is safe for you. Also work with your health care provider to adjust your insulin, other medicines, and the food you eat. This information is not intended to replace advice given to you by your health care provider. Make sure you discuss any questions you have with your health care provider. Document Revised: 05/25/2019 Document Reviewed: 05/25/2019 Elsevier Patient Education  2021 Elsevier Inc. Hypertension, Adult Hypertension is another name for high blood pressure. High blood pressure forces your heart to work harder to pump blood. This can cause problems over time. There are two numbers in a blood pressure reading. There is a top number (systolic) over a bottom number (diastolic). It is best to have a blood pressure that is below 120/80. Healthy choices can help lower your blood pressure, or you may need medicine to help lower it. What are the causes? The cause of this condition is not known. Some conditions may be related to high blood pressure. What increases the risk?  Smoking.  Having type 2 diabetes mellitus, high cholesterol, or both.  Not getting enough exercise or physical activity.  Being overweight.  Having too much fat, sugar, calories, or salt (sodium) in your diet.  Drinking too much alcohol.  Having long-term (chronic) kidney disease.  Having a family history of high blood pressure.  Age. Risk increases with age.  Race. You may be at higher risk if you are African American.  Gender. Men are at higher risk than women before age 45. After age 65, women are at higher risk than  men.  Having obstructive sleep apnea.  Stress. What are the signs or symptoms?  High blood pressure may not cause symptoms. Very high blood pressure (hypertensive crisis) may cause: ? Headache. ? Feelings of worry or nervousness (anxiety). ? Shortness of breath. ? Nosebleed. ? A feeling of being sick to your stomach (nausea). ? Throwing up (vomiting). ? Changes in how you see. ? Very bad chest pain. ? Seizures. How is this treated?  This condition is treated by making healthy lifestyle changes, such as: ? Eating healthy foods. ? Exercising more. ? Drinking less alcohol.  Your health care provider may prescribe medicine if lifestyle changes are not enough to get your blood pressure under control, and if: ? Your top number is above 130. ? Your bottom number is above 80.  Your personal target blood pressure may vary. Follow these instructions at home: Eating and drinking  If told, follow the DASH eating plan. To follow this plan: ? Fill one half of your plate at each meal with fruits and vegetables. ? Fill one fourth of your plate at each meal with whole grains. Whole grains include whole-wheat pasta, brown rice, and whole-grain bread. ? Eat or drink low-fat dairy products, such as skim milk or   low-fat yogurt. ? Fill one fourth of your plate at each meal with low-fat (lean) proteins. Low-fat proteins include fish, chicken without skin, eggs, beans, and tofu. ? Avoid fatty meat, cured and processed meat, or chicken with skin. ? Avoid pre-made or processed food.  Eat less than 1,500 mg of salt each day.  Do not drink alcohol if: ? Your doctor tells you not to drink. ? You are pregnant, may be pregnant, or are planning to become pregnant.  If you drink alcohol: ? Limit how much you use to:  0-1 drink a day for women.  0-2 drinks a day for men. ? Be aware of how much alcohol is in your drink. In the [place] one drink equals one 12 oz bottle of beer (355 mL), one 5 oz glass  of wine (148 mL), or one 1 oz glass of hard liquor (44 mL).   Lifestyle  Work with your doctor to stay at a healthy weight or to lose weight. Ask your doctor what the best weight is for you.  Get at least 30 minutes of exercise most days of the week. This may include walking, swimming, or biking.  Get at least 30 minutes of exercise that strengthens your muscles (resistance exercise) at least 3 days a week. This may include lifting weights or doing Pilates.  Do not use any products that contain nicotine or tobacco, such as cigarettes, e-cigarettes, and chewing tobacco. If you need help quitting, ask your doctor.  Check your blood pressure at home as told by your doctor.  Keep all follow-up visits as told by your doctor. This is important.   Medicines  Take over-the-counter and prescription medicines only as told by your doctor. Follow directions carefully.  Do not skip doses of blood pressure medicine. The medicine does not work as well if you skip doses. Skipping doses also puts you at risk for problems.  Ask your doctor about side effects or reactions to medicines that you should watch for. Contact a doctor if you:  Think you are having a reaction to the medicine you are taking.  Have headaches that keep coming back (recurring).  Feel dizzy.  Have swelling in your ankles.  Have trouble with your vision. Get help right away if you:  Get a very bad headache.  Start to feel mixed up (confused).  Feel weak or numb.  Feel faint.  Have very bad pain in your: ? Chest. ? Belly (abdomen).  Throw up more than once.  Have trouble breathing. Summary  Hypertension is another name for high blood pressure.  High blood pressure forces your heart to work harder to pump blood.  For most people, a normal blood pressure is less than 120/80.  Making healthy choices can help lower blood pressure. If your blood pressure does not get lower with healthy choices, you may need to  take medicine. This information is not intended to replace advice given to you by your health care provider. Make sure you discuss any questions you have with your health care provider. Document Revised: 05/07/2018 Document Reviewed: 05/07/2018 Elsevier Patient Education  2021 Elsevier Inc.  

## 2020-12-21 NOTE — Progress Notes (Signed)
I,Yamilka Roman [employer] as a Neurosurgeon for [employer], FNP.,have documented all relevant documentation on the behalf of Arnette Felts, FNP,as directed by  Arnette Felts, FNP while in the presence of Arnette Felts, FNP. This visit occurred during the SARS-CoV-2 public health emergency.  Safety protocols were in place, including screening questions prior to the visit, additional usage of staff PPE, and extensive cleaning of exam room while observing appropriate contact time as indicated for disinfecting solutions.  Subjective:     Patient ID: [name] , female    DOB: December 30, 1955 , 65 y.o.   MRN: [id number]   Chief Complaint  Patient presents with  . Hypertension  . Diabetes    HPI  Here today to follow up on wellbutrin. She is taking daily.  She has a decreased appetite. She also reports she is bored being at home. She is not driving due to her vision.   Wt Readings from Last 3 Encounters: 12/21/20 : 189 lb (85.7 kg) 11/16/20 : 195 lb (88.5 kg) 08/11/20 : 200 lb 3.2 oz (90.8 kg)   Diabetes She presents for her follow-up diabetic visit. She has type 2 diabetes mellitus. Her disease course has been worsening. There are no hypoglycemic associated symptoms. Pertinent negatives for hypoglycemia include no dizziness. Pertinent negatives for diabetes include no fatigue, no polydipsia, no polyphagia and no polyuria. There are no hypoglycemic complications. Symptoms are improving. There are no diabetic complications. Risk factors for coronary artery disease include sedentary lifestyle and obesity. Current diabetic treatment includes oral agent (dual therapy) (20 units daily). She is compliant with treatment most of the time. Her weight is stable. She is following a generally healthy diet. When asked about meal planning, she reported none. She has not had a previous visit with a dietitian. She rarely participates in exercise. (Averaging 120's) An ACE inhibitor/angiotensin II receptor blocker  is not being taken. Eye exam is not current (she is having eye treatment with Dr. Ashley Royalty for cataract, Dr.Groat is her opthalmologist).     Past Medical History:  Diagnosis Date  . Anemia 01/07/2020   REQUIRING TRANSFUSION  . Back pain   . Diabetes mellitus without complication (HCC)   . GERD (gastroesophageal reflux disease)   . Hypertension   . Olecranon bursitis of left elbow      Family History  Problem Relation Age of Onset  . Diabetes Mother   . Hypertension Mother   . Diabetes Father   . Hypertension Father      Current Outpatient Medications:  .  amLODipine (NORVASC) 5 MG tablet, Take 1 tablet (5 mg total) by mouth daily., Disp: 90 tablet, Rfl: 1 .  aspirin EC 325 MG EC tablet, Take 1 tablet (325 mg total) by mouth daily. Take x 1 month post op to decrease risk of blood clots. (Patient taking differently: Take 325 mg by mouth daily.), Disp: 30 tablet, Rfl: 0 .  atorvastatin (LIPITOR) 10 MG tablet, Take 1 tablet (10 mg total) by mouth daily., Disp: 30 tablet, Rfl: 11 .  Blood Glucose Monitoring Suppl (ACCU-CHEK AVIVA) device, Use to check blood sugars daily E11.9, Disp: 1 each, Rfl: 0 .  buPROPion (WELLBUTRIN XL) 150 MG 24 hr tablet, Take 1 tablet (150 mg total) by mouth every morning., Disp: 30 tablet, Rfl: 2 .  ferrous sulfate 325 (65 FE) MG tablet, Take 1 tablet (325 mg total) by mouth 2 (two) times daily with a meal., Disp: 60 tablet, Rfl: 1 .  glucose blood (ACCU-CHEK AVIVA PLUS)  test strip, Use as instructed, Disp: 100 each, Rfl: 2 .  Insulin Degludec-Liraglutide (XULTOPHY) 100-3.6 UNIT-MG/ML SOPN, Inject 15 Units into the skin daily., Disp: 15 mL, Rfl: 1 .  Insulin Pen Needle (BD PEN NEEDLE NANO U/F) 32G X 4 MM MISC, USE FOR 1 INJECTION DAILY, Disp: 100 each, Rfl: 1 .  Lancets (ONETOUCH DELICA PLUS LANCET33G) MISC, USE TO CHECK BLOOD SUGAR ONCE A DAY AS INSTRUCTED, Disp: 100 each, Rfl: 2 .  valsartan (DIOVAN) 160 MG tablet, TAKE 1 TABLET (160 MG TOTAL) BY MOUTH  DAILY., Disp: 90 tablet, Rfl: 1 .  Vitamin D, Ergocalciferol, (DRISDOL) 1.25 MG (50000 UNIT) CAPS capsule, TAKE 1 CAPSULE (50,000 UNITS TOTAL) BY MOUTH EVERY 7 (SEVEN) DAYS., Disp: 12 capsule, Rfl: 0   No Known Allergies   Review of Systems  Constitutional: Negative.  Negative for fatigue.  HENT: Negative.   Respiratory: Negative.   Cardiovascular: Negative.   Gastrointestinal: Negative.   Endocrine: Negative.  Negative for polydipsia, polyphagia and polyuria.  Genitourinary: Negative.   Musculoskeletal: Positive for back pain (low back pain).  Skin: Negative.   Allergic/Immunologic: Negative.   Neurological: Negative.  Negative for dizziness.  Hematological: Negative.   Psychiatric/Behavioral: Negative.      Today's Vitals   12/21/20 1133  BP: 118/62  Pulse: 63  Temp: 98.5 F (36.9 C)  TempSrc: Oral  Weight: 189 lb (85.7 kg)  Height: 5' 0.6" (1.539 m)  PainSc: 7   PainLoc: Back   Body mass index is 36.18 kg/m.  Wt Readings from Last 3 Encounters:  12/21/20 189 lb (85.7 kg)  11/16/20 195 lb (88.5 kg)  08/11/20 200 lb 3.2 oz (90.8 kg)   Objective:  Physical Exam Constitutional:      Appearance: Normal appearance. She is obese.  Cardiovascular:     Rate and Rhythm: Normal rate and regular rhythm.     Pulses: Normal pulses.     Heart sounds: Normal heart sounds.  Pulmonary:     Effort: Pulmonary effort is normal. No respiratory distress.     Breath sounds: Normal breath sounds. No wheezing.  Abdominal:     General: Abdomen is flat. Bowel sounds are normal.     Palpations: Abdomen is soft.  Musculoskeletal:        General: No swelling or tenderness. Normal range of motion.     Cervical back: Normal range of motion and neck supple.  Skin:    General: Skin is warm and dry.     Capillary Refill: Capillary refill takes less than 2 seconds.  Neurological:     General: No focal deficit present.     Mental Status: She is alert and oriented to person, place, and  time.  Psychiatric:        Mood and Affect: Mood normal.        Behavior: Behavior normal.        Thought Content: Thought content normal.        Judgment: Judgment normal.         Assessment And Plan:     1. Essential hypertension Comments: well controlled, continue with current medications  2. Type 2 diabetes mellitus without complication, without long-term current use of insulin (HCC) Comments: Chronic, stable continue with current medications no labs this visit  3. Low back pain, unspecified back pain laterality, unspecified chronicity, unspecified whether sciatica present  Negative tenderness,   Urinalysis positive protein, encouraged to stay well hydrated with water - POCT Urinalysis Dipstick (85631) - Urine Culture  4. Encounter for screening mammogram for malignant neoplasm of breast  Pt instructed on Self Breast Exam.According to ACOG guidelines Women aged 27 and older are recommended to get an annual mammogram. Order placed for mammogram  Pt encouraged to get annual mammogram - MM Digital Screening; Future  5. BMI 36.0-36.9,adult  She is encouraged to strive for BMI less than 30 to decrease cardiac risk. Advised to aim for at least 150 minutes of exercise per week.  She has a decreased appetite, mood is slightly improved.   Patient was given opportunity to ask questions. Patient verbalized understanding of the plan and was able to repeat key elements of the plan. All questions were answered to their satisfaction.  Arnette Felts, FNP   I, Arnette Felts, FNP, have reviewed all documentation for this visit. The documentation on 12/21/20 for the exam, diagnosis, procedures, and orders are all accurate and complete.   IF YOU HAVE BEEN REFERRED TO A SPECIALIST, IT MAY TAKE 1-2 WEEKS TO SCHEDULE/PROCESS THE REFERRAL. IF YOU HAVE NOT HEARD FROM US/SPECIALIST IN TWO WEEKS, PLEASE GIVE [place] A CALL AT [phone].   THE PATIENT IS ENCOURAGED TO PRACTICE SOCIAL  DISTANCING DUE TO THE COVID-19 PANDEMIC.

## 2020-12-28 LAB — URINE CULTURE

## 2021-01-04 ENCOUNTER — Encounter (INDEPENDENT_AMBULATORY_CARE_PROVIDER_SITE_OTHER): Payer: BC Managed Care – PPO | Admitting: Ophthalmology

## 2021-01-04 ENCOUNTER — Telehealth: Payer: Self-pay

## 2021-01-04 ENCOUNTER — Other Ambulatory Visit: Payer: Self-pay

## 2021-01-04 ENCOUNTER — Telehealth: Payer: BC Managed Care – PPO

## 2021-01-04 DIAGNOSIS — H34812 Central retinal vein occlusion, left eye, with macular edema: Secondary | ICD-10-CM | POA: Diagnosis not present

## 2021-01-04 DIAGNOSIS — I10 Essential (primary) hypertension: Secondary | ICD-10-CM | POA: Diagnosis not present

## 2021-01-04 DIAGNOSIS — H43813 Vitreous degeneration, bilateral: Secondary | ICD-10-CM

## 2021-01-04 DIAGNOSIS — E113512 Type 2 diabetes mellitus with proliferative diabetic retinopathy with macular edema, left eye: Secondary | ICD-10-CM

## 2021-01-04 DIAGNOSIS — E113311 Type 2 diabetes mellitus with moderate nonproliferative diabetic retinopathy with macular edema, right eye: Secondary | ICD-10-CM | POA: Diagnosis not present

## 2021-01-04 DIAGNOSIS — H35033 Hypertensive retinopathy, bilateral: Secondary | ICD-10-CM

## 2021-01-04 NOTE — Telephone Encounter (Signed)
  Chronic Care Management   Outreach Note  01/04/2021 Name: [name] MRN: [id number] DOB: [birth year]  Referred by: Arnette Felts, FNP Reason for referral : Care Coordination (#2 RN CM FU Call Attempt )   A second unsuccessful telephone outreach was attempted today. The patient was referred to the case management team for assistance with care management and care coordination.   Follow Up Plan: A HIPAA compliant phone message was left for the patient providing contact information and requesting a return call. Telephone follow up appointment with care management team member scheduled for: 02/24/21  Delsa Sale, RN, BSN, CCM Care Management Coordinator [facility] Care Management/[facility]  Direct Phone: [phone]

## 2021-01-07 DIAGNOSIS — Z6836 Body mass index (BMI) 36.0-36.9, adult: Secondary | ICD-10-CM | POA: Insufficient documentation

## 2021-01-25 ENCOUNTER — Other Ambulatory Visit: Payer: Self-pay | Admitting: Nurse Practitioner

## 2021-01-25 DIAGNOSIS — F3289 Other specified depressive episodes: Secondary | ICD-10-CM

## 2021-01-26 ENCOUNTER — Ambulatory Visit: Payer: Self-pay

## 2021-01-26 ENCOUNTER — Telehealth: Payer: BC Managed Care – PPO

## 2021-01-26 DIAGNOSIS — I10 Essential (primary) hypertension: Secondary | ICD-10-CM

## 2021-01-26 DIAGNOSIS — F32A Depression, unspecified: Secondary | ICD-10-CM

## 2021-01-26 DIAGNOSIS — E119 Type 2 diabetes mellitus without complications: Secondary | ICD-10-CM

## 2021-01-26 DIAGNOSIS — M545 Low back pain, unspecified: Secondary | ICD-10-CM

## 2021-01-26 DIAGNOSIS — D509 Iron deficiency anemia, unspecified: Secondary | ICD-10-CM

## 2021-01-26 DIAGNOSIS — E559 Vitamin D deficiency, unspecified: Secondary | ICD-10-CM

## 2021-02-01 ENCOUNTER — Other Ambulatory Visit: Payer: Self-pay

## 2021-02-01 ENCOUNTER — Encounter (INDEPENDENT_AMBULATORY_CARE_PROVIDER_SITE_OTHER): Payer: BC Managed Care – PPO | Admitting: Ophthalmology

## 2021-02-01 DIAGNOSIS — H34812 Central retinal vein occlusion, left eye, with macular edema: Secondary | ICD-10-CM | POA: Diagnosis not present

## 2021-02-01 DIAGNOSIS — E113311 Type 2 diabetes mellitus with moderate nonproliferative diabetic retinopathy with macular edema, right eye: Secondary | ICD-10-CM

## 2021-02-01 DIAGNOSIS — H35033 Hypertensive retinopathy, bilateral: Secondary | ICD-10-CM

## 2021-02-01 DIAGNOSIS — E113512 Type 2 diabetes mellitus with proliferative diabetic retinopathy with macular edema, left eye: Secondary | ICD-10-CM

## 2021-02-01 DIAGNOSIS — H43813 Vitreous degeneration, bilateral: Secondary | ICD-10-CM

## 2021-02-01 DIAGNOSIS — I10 Essential (primary) hypertension: Secondary | ICD-10-CM

## 2021-02-03 NOTE — Patient Instructions (Signed)
Goals Addressed      Patient Stated   .  COMPLETED: "to be able to have more good days than bad days" (pt-stated)        CARE PLAN ENTRY (see longitudinal plan of care for additional care plan information)  Current Barriers:  [name] Knowledge Deficits related to disease process and Self Health management of Depression  . Chronic Disease Management support and education needs related to Essential Hypertension, Diabetes Mellitus type 2, Hypertensive Retinopathy, Depression  . Film/video editor.   Nurse Case Manager Clinical Goal(s):  [name] Over the next 180 days, patient will work with the CCM team and PCP to address needs related to disease education and support to improve Self Health management of Depression   CCM RN CM Interventions:  . Inter-disciplinary care team collaboration (see longitudinal plan of care) . Evaluation of current treatment plan related to Depression  and patient's adherence to plan as established by provider. . Provided education to patient re: disease process and treatment recommendations for management of depression  . Determined patient continues to receive talk therapy by licensed professional from [employer]  . Determined patient feels she may need to start an antidepressant to help her cope more effectively, although reluctant due to fear of SE  . Collaborated with PCP [name] FNP regarding patient's request to start an antidepressant  . Discussed plans with patient for ongoing care management follow up and provided patient with direct contact information for care management team . Provided patient with printed educational materials related to Mood and Your Health zone safety tool  07/01/20 call completed with patient  . Informed patient PCP prescribed escitalopram (LEXAPRO) 10 MG tablet, take 1 tablet daily, patient verbalizes understanding    Patient Self Care Activities:  . Self administers medications as prescribed . Attends all scheduled  provider appointments . Calls pharmacy for medication refills . Calls provider office for new concerns or questions  Initial goal documentation  01/26/21 Please see new [employer] Care plan for updated goal.     .  COMPLETED: "to have less fatigue" (pt-stated)        CARE PLAN ENTRY (see longitudinal plan of care for additional care plan information)  Current Barriers:  [name] Knowledge Deficits related to disease process and Self Health management of Iron Deficiency Anemia . Chronic Disease Management support and education needs related to Depression, Essential Hypertension, Iron deficiency Anemia, DMII  Nurse Case Manager Clinical Goal(s):  [name] Over the next 90 days, patient will work with the CCM team and PCP to address needs related to disease education and support to improve Self Health management of Anemia  CCM RN CM Interventions:  01/26/20 call completed with patient  . Inter-disciplinary care team collaboration (see longitudinal plan of care) . Evaluation of current treatment plan related to Iron deficiency Anemia and patient's adherence to plan as established by provider. . Reviewed medications with patient and discussed patient is adhering to taking an iron supplement as directed for management of her Anemia . Discussed plans with patient for ongoing care management follow up and provided patient with direct contact information for care management team . Provided patient with printed educational materials related to [place]  Patient Self Care Activities:  . Self administers medications as prescribed . Attends all scheduled provider appointments . Calls pharmacy for medication refills . Calls provider office for new concerns or questions  Initial goal documentation  01/26/21 Please see new Elsevier Care plan for updated goal.     .  COMPLETED: "to improve my vision" (pt-stated)        CARE PLAN ENTRY (see longitudinal plan of care for additional care plan  information)  Current Barriers:  [name] Knowledge Deficits related to disease process and Self Health management for Retinopathy . Chronic Disease Management support and education needs related to DMII, Essential Hypertension, Depression unspecified, Iron Deficiency Anemia, Cytomegalovirus unspecified type   Nurse Case Manager Clinical Goal(s):  [name] Over the next 30 days, patient will work with Retinal Specialist to address needs related to evaluate and treat Retinopathy  Goal Met  . Over the next 90 days, patient will work with the CCM team and PCP to address needs related to disease education and support to increase knowledge of disease process and Self Health management of Retinopathy  CCM RN CM Interventions:  07/21//21 call completed with patient  . Inter-disciplinary care team collaboration (see longitudinal plan of care) . Evaluation of current treatment plan related to Retinopathy and patient's adherence to plan as established by provider. . Reinforced education to patient re: disease process of Retinopathy and how to Self improve per recommendations of PCP and Retinal Specialist, and keeping DM and HTN well controlled  . Determined patient completed her follow up with Dr. [name], Retinal Specialist as scheduled  . Discussed plans with patient for ongoing care management follow up and provided patient with direct contact information for care management team  Patient Self Care Activities:  . Self administers medications as prescribed . Attends all scheduled provider appointments . Calls pharmacy for medication refills . Performs ADL's independently . Performs IADL's independently . Calls provider office for new concerns or questions  Initial goal documentation   01/26/21 Please see new Elsevier Care plan for updated goal.     .  COMPLETED: "to keep my BP well controlled" (pt-stated)        CARE PLAN ENTRY (see longitudinal plan of care for additional care plan  information)  Current Barriers:  [name] Knowledge Deficits related to disease process and Self Health management of HTN . Chronic Disease Management support and education needs related to Depression, Essential Hypertension, Iron deficiency Anemia, DMII  Nurse Case Manager Clinical Goal(s):  [name] Over the next 90 days, patient will work with the CCM team and PCP to address needs related to disease education and support to improve Self Health management of HTN  CCM RN CM Interventions:  06/29/20 call completed with patient  . Inter-disciplinary care team collaboration (see longitudinal plan of care) . Evaluation of current treatment plan related to HTN and patient's adherence to plan as established by provider; Reviewed and discussed last OV BP is noted to  be 140/70 . Re-educated patient re: target BP < 130/80; Determined patient continues not to have a BP cuff at home, she continues to spot check her BP when visiting her local pharmacy; Re-educated on importance of consistent BP control for best chance to prevent Cardiovascular events and or renal failure . Determined patient continues to be adherent with taking her BP medications exactly as prescribed  . Sent in basket message to PCP provider [name], FNP regarding need to send a faxed Rx for a BP cuff to [facility] fax # [phone], including patient demographics and insurance information in order for the pharmacy to bill patient's insurance, patient is aware and agreeable  . Mailed printed educational materials related to What is High Blood Pressure?; High Blood Pressure and Stroke; African American and High Blood Pressure; Why Should I Lower Sodium?;  Life's Simple 7  . Discussed plans with patient for ongoing care management follow up and provided patient with direct contact information for care management team  Patient Self Care Activities:  . Self administers medications as prescribed . Attends all scheduled provider  appointments . Calls pharmacy for medication refills . Calls provider office for new concerns or questions  Please see past updates related to this goal by clicking on the "Past Updates" button in the selected goal   01/26/21 Please see new Elsevier Care plan for updated goal.       Other   .  Impaired Vision - treatment optimized   On track     Timeframe:  Long-Range Goal Priority:  High Start Date:  01/26/21                           Expected End Date:  01/26/22  Next Scheduled Follow up date: 02/24/21           Self Care Activities:  . Continue to keep all scheduled follow up appointments . Take medications as directed  . Let your healthcare team know if you are unable to take your medications . Call your pharmacy for refills at least 7 days prior to running out of medication Patient Goals: - to improve vision               .  Monitor and Manage My Blood Sugar-Diabetes Type 2   On track     Timeframe:  Long-Range Goal Priority:  Medium Start Date:  01/26/21                          Expected End Date:  01/26/22                     Follow Up Date: 02/24/21    - check blood sugar at prescribed times - check blood sugar before and after exercise - check blood sugar if I feel it is too high or too low - enter blood sugar readings and medication or insulin into daily log - take the blood sugar log to all doctor visits - take the blood sugar meter to all doctor visits    Why is this important?    Checking your blood sugar at home helps to keep it from getting very high or very low.   Writing the results in a diary or log helps the doctor know how to care for you.   Your blood sugar log should have the time, date and the results.   Also, write down the amount of insulin or other medicine that you take.   Other information, like what you ate, exercise done and how you were feeling, will also be helpful.     Notes:     .  Obtain Eye Exam-Diabetes Type 2   On track      Timeframe:  Long-Range Goal Priority:  Medium Start Date:  01/26/21                           Expected End Date:  01/26/22                      Follow Up Date: 02/24/21    - keep appointment with eye doctor - schedule appointment with eye doctor    Why is this important?  Eye check-ups are important when you have diabetes.   Vision loss can be prevented.    Notes:     .  Perform Foot Care-Diabetes Type 2   On track     Timeframe:  Long-Range Goal Priority:  Medium Start Date:  01/26/21                           Expected End Date: 01/26/22                     Follow Up Date: 02/24/21   - check feet daily for cuts, sores or redness - keep feet up while sitting - trim toenails straight across - wash and dry feet carefully every day - wear comfortable, cotton socks - wear comfortable, well-fitting shoes    Why is this important?    Good foot care is very important when you have diabetes.   There are many things you can do to keep your feet healthy and catch a problem early.    Notes:     .  Response to treatment - Depression   On track     Timeframe:  Long-Range Goal Priority:  High Start Date:  01/26/21                          Expected End Date: 01/26/22  Next Scheduled Follow up date: 02/24/21  Self Care Activities:  . Continue to keep all scheduled follow up appointments . Take medications as directed  . Let your healthcare team know if you are unable to take your medications . Call your pharmacy for refills at least 7 days prior to running out of medication . Increase your water intake to 64 oz per day unless otherwise directed . Get at least 8 hrs of sleep each night and try to get at least 15 minutes of natural sunlight each day when possible  . Report persistent or worsening signs/symptoms of depression to your PCP promptly Patient Goals: - to be less depressed and anxious                          .  Track and Manage My Blood Pressure-Hypertension   On track      Timeframe:  Long-Range Goal Priority:  Medium Start Date:  01/26/21                           Expected End Date:  01/26/22                     Follow Up Date: 02/24/21   - check blood pressure 3 times per week - choose a place to take my blood pressure (home, clinic or office, retail store) - write blood pressure results in a log or diary    Why is this important?    You won't feel high blood pressure, but it can still hurt your blood vessels.   High blood pressure can cause heart or kidney problems. It can also cause a stroke.   Making lifestyle changes like losing a [place] weight or eating less salt will help.   Checking your blood pressure at home and at different times of the day can help to control blood pressure.   If the doctor prescribes medicine remember to take it the way the doctor ordered.  Call the office if you cannot afford the medicine or if there are questions about it.     Notes:

## 2021-02-03 NOTE — Chronic Care Management (AMB) (Signed)
Care Management    RN Visit Note  01/26/2021 Name: [name] [name] MRN: [id number] DOB: [birth year]  Subjective: [name] [name] is a 65 y.o. year old female who is a primary care patient of [name], [place]. The care management team was consulted for assistance with disease management and care coordination needs.    Engaged with patient by telephone for follow up visit in response to provider referral for case management and/or care coordination services.   Consent to Services:   [name] [name] was given information about Care Management services today including:  1. Care Management services includes personalized support from designated clinical staff supervised by her physician, including individualized plan of care and coordination with other care providers 2. 24/7 contact phone numbers for assistance for urgent and routine care needs. 3. The patient may stop case management services at any time by phone call to the office staff.  Patient agreed to services and consent obtained.   Assessment: Review of patient past medical history, allergies, medications, health status, including review of consultants reports, laboratory and other test data, was performed as part of comprehensive evaluation and provision of chronic care management services.   SDOH (Social Determinants of Health) assessments and interventions performed:  Yes, no acute needs   Care Plan  No Known Allergies  Outpatient Encounter Medications as of 01/26/2021  Medication Sig  . amLODipine (NORVASC) 5 MG tablet Take 1 tablet (5 mg total) by mouth daily.  [name] aspirin EC 325 MG EC tablet Take 1 tablet (325 mg total) by mouth daily. Take x 1 month post op to decrease risk of blood clots. (Patient taking differently: Take 325 mg by mouth daily.)  . atorvastatin (LIPITOR) 10 MG tablet Take 1 tablet (10 mg total) by mouth daily.  [name] buPROPion (WELLBUTRIN XL) 150 MG 24 hr tablet Take 1 tablet (150 mg total) by mouth every  morning.  . escitalopram (LEXAPRO) 10 MG tablet TAKE 1 TABLET (10 MG TOTAL) BY MOUTH DAILY.  [name] glucose blood (ACCU-CHEK AVIVA PLUS) test strip Use as instructed  . Insulin Degludec-Liraglutide (XULTOPHY) 100-3.6 UNIT-MG/ML SOPN Inject 15 Units into the skin daily.  . Insulin Pen Needle (BD PEN NEEDLE NANO U/F) 32G X 4 MM MISC USE FOR 1 INJECTION DAILY  . Lancets (ONETOUCH DELICA PLUS [id number]) MISC USE TO CHECK BLOOD SUGAR ONCE A DAY AS INSTRUCTED  . valsartan (DIOVAN) 160 MG tablet TAKE 1 TABLET (160 MG TOTAL) BY MOUTH DAILY.  [name] Vitamin D, Ergocalciferol, (DRISDOL) 1.25 MG (50000 UNIT) CAPS capsule TAKE 1 CAPSULE (50,000 UNITS TOTAL) BY MOUTH EVERY 7 (SEVEN) DAYS.   No facility-administered encounter medications on file as of 01/26/2021.    Patient Active Problem List   Diagnosis Date Noted  . BMI 36.0-36.9,adult 01/07/2021  . CMV (cytomegalovirus) antibody positive 03/02/2020  . Iron deficiency anemia 01/08/2020  . 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 Chronic back pain 01/07/2020  . Tobacco dependence 01/07/2020  . Hypertensive retinopathy of both eyes 02/18/2019  . Combined form of age-related cataract, both eyes 02/18/2019  . Closed displaced oblique fracture of shaft of left humerus 12/17/2018  . Insomnia 06/08/2018  . Essential hypertension 06/08/2018  . Olecranon bursitis, left elbow 06/19/2017  . Closed fracture of distal end of right fibula and tibia 06/10/2016  . Diabetes ([place]) 06/10/2016  . Traumatic closed displaced fracture of shaft of right tibia and fibula 06/10/2016    Conditions to be addressed/monitored: HTN, DMII, Anxiety, Depression and Iron Deficiency Anemia  Care Plan : Depression (Adult)  Updates made by Lynne Logan, RN since 02/03/2021 12:00 AM    Problem: Response to Treatment (Depression)   Priority: High    Long-Range Goal: Response to Treatment Maximized   Start Date: 01/26/2021  Expected End Date: 01/26/2022  This Visit's Progress: On track   Priority: High  Note:   Current Barriers:   Ineffective Self Health Maintenance   Financial Restraints   Clinical Goal(s):  [name] Collaboration with [name], FNP regarding development and update of comprehensive plan of care as evidenced by provider attestation and co-signature . Inter-disciplinary care team collaboration (see longitudinal plan of care)  patient will work with care management team to address care coordination and chronic disease management needs related to Disease Management  Educational Needs  Care Coordination  Medication Management and Education  Psychosocial Support   Interventions:  01/26/21 completed successful outbound call with patient   Evaluation of current treatment plan related to Anxiety and Depression, self-management and patient's adherence to plan as established by provider.  Collaboration with [name], FNP regarding development and update of comprehensive plan of care as evidenced by provider attestation       and co-signature  Inter-disciplinary care team collaboration (see longitudinal plan of care) . Provided education to patient about basic disease process related to depression and anxiety . Review of patient status, including review of consultant's reports, relevant laboratory and other test results, and medications completed. . Reviewed medications with patient and discussed importance of medication adherence . Determined patient feels her overall health has improved and this helping her feel less depressed and anxious . Determined patient uses prayer, meditation and the support of her church and friend group to help you cope  . Instructed patient to report worsening signs/symptoms of depression promptly  . Educated patient on the importance of balancing activity with rest, try to get at least 8 hrs of sleep each night while staying on the same schedule, try to exercise at least 30 minutes a day, 5 days each week, eat a well balanced  diet, getting at least 15 minutes of sunshine each day when able and drink at least 64 oz of water daily unless otherwise directed   Discussed plans with patient for ongoing care management follow up and provided patient with direct contact information for care management team Self Care Activities:  . Continue to keep all scheduled follow up appointments . Take medications as directed  . Let your healthcare team know if you are unable to take your medications . Call your pharmacy for refills at least 7 days prior to running out of medication . Increase your water intake to 64 oz per day unless otherwise directed . Get at least 8 hrs of sleep each night and try to get at least 15 minutes of natural sunlight each day when possible  . Report persistent or worsening signs/symptoms of depression to your PCP promptly Patient Goals: - to be less depressed and anxious  Follow Up Plan: Telephone follow up appointment with care management team member scheduled for: 02/24/21     Care Plan : Hypertension (Adult)  Updates made by Lynne Logan, RN since 02/03/2021 12:00 AM    Problem: Hypertension (Hypertension)   Priority: Medium    Long-Range Goal: Hypertension Monitored   Start Date: 01/26/2021  Expected End Date: 01/26/2022  This Visit's Progress: On track  Priority: Medium  Note:   Objective:  . Last practice recorded BP readings:  BP Readings  from Last 3 Encounters:  12/21/20 118/62  11/16/20 130/68  08/11/20 118/62 .   [name] Most recent eGFR/CrCl:  Lab Results  Component Value Date   EGFR 83 11/16/2020 .    No components found for: CRCL Current Barriers:  [name] Knowledge Deficits related to basic understanding of hypertension pathophysiology and self care management . Knowledge Deficits related to understanding of medications prescribed for management of hypertension . Film/video editor.  Case Manager Clinical Goal(s):  . patient will demonstrate improved adherence to prescribed  treatment plan for hypertension as evidenced by taking all medications as prescribed, monitoring and recording blood pressure as directed, adhering to low sodium/DASH diet Interventions:  01/26/21 completed successful outbound call with patient  . Collaboration with [name], FNP regarding development and update of comprehensive plan of care as evidenced by provider attestation and co-signature . Inter-disciplinary care team collaboration (see longitudinal plan of care) . Evaluation of current treatment plan related to hypertension self management and patient's adherence to plan as established by provider. . Provided education to patient re: stroke prevention, s/s of heart attack and stroke, DASH diet, complications of uncontrolled blood pressure . Reviewed medications with patient and discussed importance of compliance . Advised patient, providing education and rationale, to monitor blood pressure daily and record, calling PCP for findings outside established parameters.  . Discussed plans with patient for ongoing care management follow up and provided patient with direct contact information for care management team . Reviewed scheduled/upcoming provider appointments including: PCP follow up scheduled for 02/20/21 $RemoveBef'@3'AmrfnAWSuu$ :30 PM Self-Care Activities: Self administers medications as prescribed Attends all scheduled provider appointments Calls provider office for new concerns, questions, or BP outside discussed parameters Checks BP and records as discussed Follows a low sodium diet/DASH diet Patient Goals: - check blood pressure 3 times per week - choose a place to take my blood pressure (home, clinic or office, retail store) - write blood pressure results in a log or diary - learn about high blood pressure  Follow Up Plan: Telephone follow up appointment with care management team member scheduled for: 02/24/21    Plan: Telephone follow up appointment with care management team member scheduled  for:  02/24/21  Barb Merino, RN, BSN, CCM Care Management Coordinator [place] [facility]  Direct Phone: [phone]

## 2021-02-20 ENCOUNTER — Ambulatory Visit: Payer: BC Managed Care – PPO | Admitting: Nurse Practitioner

## 2021-02-24 ENCOUNTER — Telehealth: Payer: BC Managed Care – PPO

## 2021-03-02 ENCOUNTER — Encounter (INDEPENDENT_AMBULATORY_CARE_PROVIDER_SITE_OTHER): Payer: BC Managed Care – PPO | Admitting: Ophthalmology

## 2021-03-03 ENCOUNTER — Ambulatory Visit: Payer: BC Managed Care – PPO

## 2021-03-06 ENCOUNTER — Encounter (INDEPENDENT_AMBULATORY_CARE_PROVIDER_SITE_OTHER): Payer: BC Managed Care – PPO | Admitting: Ophthalmology

## 2021-03-06 ENCOUNTER — Encounter: Payer: Self-pay | Admitting: Nurse Practitioner

## 2021-03-06 ENCOUNTER — Ambulatory Visit: Payer: BC Managed Care – PPO | Admitting: Nurse Practitioner

## 2021-03-06 ENCOUNTER — Other Ambulatory Visit: Payer: Self-pay

## 2021-03-06 VITALS — BP 122/78 | HR 56 | Temp 98.6°F | Ht 60.6 in | Wt 186.8 lb

## 2021-03-06 DIAGNOSIS — E119 Type 2 diabetes mellitus without complications: Secondary | ICD-10-CM

## 2021-03-06 DIAGNOSIS — L819 Disorder of pigmentation, unspecified: Secondary | ICD-10-CM | POA: Diagnosis not present

## 2021-03-06 DIAGNOSIS — E559 Vitamin D deficiency, unspecified: Secondary | ICD-10-CM

## 2021-03-06 DIAGNOSIS — E113512 Type 2 diabetes mellitus with proliferative diabetic retinopathy with macular edema, left eye: Secondary | ICD-10-CM | POA: Diagnosis not present

## 2021-03-06 DIAGNOSIS — H43813 Vitreous degeneration, bilateral: Secondary | ICD-10-CM

## 2021-03-06 DIAGNOSIS — H35033 Hypertensive retinopathy, bilateral: Secondary | ICD-10-CM

## 2021-03-06 DIAGNOSIS — H34812 Central retinal vein occlusion, left eye, with macular edema: Secondary | ICD-10-CM | POA: Diagnosis not present

## 2021-03-06 DIAGNOSIS — I10 Essential (primary) hypertension: Secondary | ICD-10-CM

## 2021-03-06 DIAGNOSIS — E113311 Type 2 diabetes mellitus with moderate nonproliferative diabetic retinopathy with macular edema, right eye: Secondary | ICD-10-CM

## 2021-03-06 DIAGNOSIS — Z23 Encounter for immunization: Secondary | ICD-10-CM

## 2021-03-06 MED ORDER — SHINGRIX 50 MCG/0.5ML IM SUSR
0.5000 mL | Freq: Once | INTRAMUSCULAR | 0 refills | Status: DC
Start: 2021-03-06 — End: 2021-03-08

## 2021-03-06 MED ORDER — HYDROCORTISONE 1 % EX CREA: TOPICAL_CREAM | CUTANEOUS | 3 refills | Status: DC

## 2021-03-06 MED ORDER — TETANUS-DIPHTH-ACELL PERTUSSIS 5-2.5-18.5 LF-MCG/0.5 IM SUSP: 0.5000 mL | Freq: Once | INTRAMUSCULAR | 0 refills | Status: DC

## 2021-03-06 NOTE — Progress Notes (Signed)
I,Yamilka Roman [employer] as a Neurosurgeon for [employer], FNP.,have documented all relevant documentation on the behalf of Arnette Felts, FNP,as directed by  Arnette Felts, FNP while in the presence of Arnette Felts, FNP.  This visit occurred during the SARS-CoV-2 public health emergency.  Safety protocols were in place, including screening questions prior to the visit, additional usage of staff PPE, and extensive cleaning of exam room while observing appropriate contact time as indicated for disinfecting solutions.  Subjective:     Patient ID: [name] , female    DOB: October 13, 1955 , 65 y.o.   MRN: [id number]   Chief Complaint  Patient presents with   Hypertension   Diabetes    HPI  Patient presents today for a diabetes and blood pressure f/u  Wt Readings from Last 3 Encounters: 03/06/21 : 186 lb 12.8 oz (84.7 kg) 12/21/20 : 189 lb (85.7 kg) 11/16/20 : 195 lb (88.5 kg)    She had injections to both eyes today prior to this visit. She has blood clots to her blood vessels and the pressure is elevated in her eyes.    Hypertension This is a chronic problem. The current episode started more than 1 year ago. The problem is uncontrolled. Pertinent negatives include no blurred vision, chest pain or headaches. There are no associated agents to hypertension. Risk factors for coronary artery disease include sedentary lifestyle, obesity and diabetes mellitus.  Diabetes She presents for her follow-up diabetic visit. She has type 2 diabetes mellitus. Her disease course has been worsening. There are no hypoglycemic associated symptoms. Pertinent negatives for hypoglycemia include no dizziness or headaches. Pertinent negatives for diabetes include no blurred vision, no chest pain, no fatigue, no polydipsia, no polyphagia and no polyuria. There are no hypoglycemic complications. Symptoms are improving. There are no diabetic complications. Risk factors for coronary artery disease include sedentary  lifestyle and obesity. Current diabetic treatment includes oral agent (dual therapy) (15 units Xultophy daily). She is compliant with treatment most of the time. Her weight is stable. She is following a generally healthy diet. When asked about meal planning, she reported none. She has not had a previous visit with a dietitian. She rarely participates in exercise. (Blood sugar ranging 128-134, she did have one elevated reading but realizes she had eaten something bad) An ACE inhibitor/angiotensin II receptor blocker is not being taken. Eye exam is not current (she is having eye treatment with Dr. Ashley Royalty for cataract, Dr.Groat is her opthalmologist).    Past Medical History:  Diagnosis Date   Anemia 01/07/2020   REQUIRING TRANSFUSION   Back pain    Diabetes mellitus without complication (HCC)    GERD (gastroesophageal reflux disease)    Hypertension    Olecranon bursitis of left elbow      Family History  Problem Relation Age of Onset   Diabetes Mother    Hypertension Mother    Diabetes Father    Hypertension Father      Current Outpatient Medications:    amLODipine (NORVASC) 5 MG tablet, Take 1 tablet (5 mg total) by mouth daily., Disp: 90 tablet, Rfl: 1   aspirin EC 325 MG EC tablet, Take 1 tablet (325 mg total) by mouth daily. Take x 1 month post op to decrease risk of blood clots. (Patient taking differently: Take 325 mg by mouth daily.), Disp: 30 tablet, Rfl: 0   atorvastatin (LIPITOR) 10 MG tablet, Take 1 tablet (10 mg total) by mouth daily., Disp: 30 tablet, Rfl: 11  escitalopram (LEXAPRO) 10 MG tablet, TAKE 1 TABLET (10 MG TOTAL) BY MOUTH DAILY., Disp: 30 tablet, Rfl: 2   ferrous sulfate 325 (65 FE) MG tablet, Take 1 tablet (325 mg total) by mouth 2 (two) times daily with a meal., Disp: 60 tablet, Rfl: 1   glucose blood (ACCU-CHEK AVIVA PLUS) test strip, Use as instructed, Disp: 100 each, Rfl: 2   hydrocortisone cream 1 %, Apply to affected area 2 times daily, Disp: 30 g, Rfl:  3   Insulin Degludec-Liraglutide (XULTOPHY) 100-3.6 UNIT-MG/ML SOPN, Inject 15 Units into the skin daily., Disp: 15 mL, Rfl: 1   Insulin Pen Needle (BD PEN NEEDLE NANO U/F) 32G X 4 MM MISC, USE FOR 1 INJECTION DAILY, Disp: 100 each, Rfl: 1   Lancets (ONETOUCH DELICA PLUS [id number]) MISC, USE TO CHECK BLOOD SUGAR ONCE A DAY AS INSTRUCTED, Disp: 100 each, Rfl: 2   valsartan (DIOVAN) 160 MG tablet, TAKE 1 TABLET (160 MG TOTAL) BY MOUTH DAILY., Disp: 90 tablet, Rfl: 1   Vitamin D, Ergocalciferol, (DRISDOL) 1.25 MG (50000 UNIT) CAPS capsule, TAKE 1 CAPSULE (50,000 UNITS TOTAL) BY MOUTH EVERY 7 (SEVEN) DAYS., Disp: 12 capsule, Rfl: 0   No Known Allergies   Review of Systems  Constitutional:  Negative for fatigue.  Eyes:  Negative for blurred vision.  Cardiovascular:  Negative for chest pain.  Endocrine: Negative for polydipsia, polyphagia and polyuria.  Neurological:  Negative for dizziness and headaches.    Today's Vitals   03/06/21 1558  BP: 122/78  Pulse: (!) 56  Temp: 98.6 F (37 C)  Weight: 186 lb 12.8 oz (84.7 kg)  Height: 5' 0.6" (1.539 m)  PainSc: 5    Body mass index is 35.76 kg/m.   Objective:  Physical Exam Constitutional:      General: She is not in acute distress.    Appearance: Normal appearance. She is obese.  Cardiovascular:     Rate and Rhythm: Normal rate and regular rhythm.     Pulses: Normal pulses.     Heart sounds: Normal heart sounds. No murmur heard. Pulmonary:     Effort: Pulmonary effort is normal. No respiratory distress.     Breath sounds: Normal breath sounds. No wheezing.  Musculoskeletal:     Cervical back: Normal range of motion and neck supple.  Skin:    General: Skin is warm and dry.     Capillary Refill: Capillary refill takes less than 2 seconds.     Findings: Lesion (hyperpigmented skin to left elbow and posterior forearm) present.  Neurological:     General: No focal deficit present.     Mental Status: She is alert and oriented to  person, place, and time.  Psychiatric:        Mood and Affect: Mood normal.        Behavior: Behavior normal.        Thought Content: Thought content normal.        Judgment: Judgment normal.        Assessment And Plan:     1. Type 2 diabetes mellitus without complication, without long-term current use of insulin (HCC) Chronic, improving at last visit Continue with current medications Will check HgbA1c - Hemoglobin A1c - Lipid panel  2. Essential hypertension Chronic, excellent control Continue current medications - CMP14+EGFR  3. Vitamin D deficiency Will check vitamin D level and supplement as needed.    Also encouraged to spend 15 minutes in the sun daily.   4. Hyperpigmented skin lesion  She has thickened skin and hyperpigmentation to her left posterior forearm - Ambulatory referral to Dermatology - hydrocortisone cream 1 %; Apply to affected area 2 times daily  Dispense: 30 g; Refill: 3  5. Encounter for immunization Rxs sent to pharmacy, she is aware to get her shingrix by itself and to make sure she is not sick at the time - Zoster Vaccine Adjuvanted [facility]) injection; Inject 0.5 mLs into the muscle once for 1 dose.  Dispense: 0.5 mL; Refill: 0 - Tdap (BOOSTRIX) 5-2.5-18.5 LF-MCG/0.5 injection; Inject 0.5 mLs into the muscle once for 1 dose.  Dispense: 0.5 mL; Refill: 0 - Pneumococcal conjugate vaccine 20-valent     Patient was given opportunity to ask questions. Patient verbalized understanding of the plan and was able to repeat key elements of the plan. All questions were answered to their satisfaction.  [name], FNP   I, [name], FNP, have reviewed all documentation for this visit. The documentation on 03/07/21 for the exam, diagnosis, procedures, and orders are all accurate and complete.   IF YOU HAVE BEEN REFERRED TO A SPECIALIST, IT MAY TAKE 1-2 WEEKS TO SCHEDULE/PROCESS THE REFERRAL. IF YOU HAVE NOT HEARD FROM US/SPECIALIST IN TWO WEEKS, PLEASE  GIVE [place] A CALL AT [phone] X [phone].   THE PATIENT IS ENCOURAGED TO PRACTICE SOCIAL DISTANCING DUE TO THE COVID-19 PANDEMIC.

## 2021-03-06 NOTE — Patient Instructions (Signed)

## 2021-03-07 LAB — LIPID PANEL
Chol/HDL Ratio: 2.8 ratio (ref 0.0–4.4)
Cholesterol, Total: 113 mg/dL (ref 100–199)
HDL: 40 mg/dL (ref >39)
LDL Chol Calc (NIH): 55 mg/dL (ref 0–99)
Triglycerides: 94 mg/dL (ref 0–149)
VLDL Cholesterol Cal: 18 mg/dL (ref 5–40)

## 2021-03-07 LAB — CMP14+EGFR
ALT: 12 IU/L (ref 0–32)
AST: 10 IU/L (ref 0–40)
Albumin/Globulin Ratio: 1.7 (ref 1.2–2.2)
Albumin: 4.2 g/dL (ref 3.8–4.8)
Alkaline Phosphatase: 112 IU/L (ref 44–121)
BUN/Creatinine Ratio: 25 (ref 12–28)
BUN: 21 mg/dL (ref 8–27)
Bilirubin Total: <0.2 mg/dL (ref 0.0–1.2)
CO2: 22 mmol/L (ref 20–29)
Calcium: 9 mg/dL (ref 8.7–10.3)
Chloride: 105 mmol/L (ref 96–106)
Creatinine, Ser: 0.84 mg/dL (ref 0.57–1.00)
Globulin, Total: 2.5 g/dL (ref 1.5–4.5)
Glucose: 72 mg/dL (ref 65–99)
Potassium: 4.4 mmol/L (ref 3.5–5.2)
Sodium: 141 mmol/L (ref 134–144)
Total Protein: 6.7 g/dL (ref 6.0–8.5)
eGFR: 78 mL/min/{1.73_m2} (ref >59)

## 2021-03-07 LAB — HEMOGLOBIN A1C
Est. average glucose Bld gHb Est-mCnc: 123 mg/dL
Hgb A1c MFr Bld: 5.9 % — ABNORMAL HIGH (ref 4.8–5.6)

## 2021-03-08 MED ORDER — SHINGRIX 50 MCG/0.5ML IM SUSR: 0.5000 mL | Freq: Once | INTRAMUSCULAR | 0 refills | Status: AC

## 2021-03-08 MED ORDER — TETANUS-DIPHTH-ACELL PERTUSSIS 5-2.5-18.5 LF-MCG/0.5 IM SUSP: 0.5000 mL | Freq: Once | INTRAMUSCULAR | 0 refills | Status: AC

## 2021-03-08 MED ORDER — PREVNAR 20 0.5 ML IM SUSY: 0.5000 mL | PREFILLED_SYRINGE | INTRAMUSCULAR | 0 refills | Status: AC

## 2021-03-16 ENCOUNTER — Ambulatory Visit: Payer: BC Managed Care – PPO

## 2021-03-16 DIAGNOSIS — E119 Type 2 diabetes mellitus without complications: Secondary | ICD-10-CM

## 2021-03-16 DIAGNOSIS — I10 Essential (primary) hypertension: Secondary | ICD-10-CM

## 2021-03-16 NOTE — Chronic Care Management (AMB) (Signed)
Social Work Note  03/16/2021 Name: [name] MRN: [id number] DOB: [birth year]  [name] is a 65 y.o. year old female who is a primary care patient of Kristin Felts, FNP.  The Care Management team was consulted for assistance with chronic disease management and care coordination needs.  [name] was given information about Care Management services today including:  Care Management services include personalized support from designated clinical staff supervised by her physician, including individualized plan of care and coordination with other care providers 24/7 contact phone numbers for assistance for urgent and routine care needs. The patient may stop care management services at any time (effective at the end of the month) by phone call to the office staff.  Patient agreed to services and consent obtained.   Engaged with patient by telephone for follow up visit in response to provider referral for social work chronic care management and care coordination services.  Assessment: Review of patient past medical history, allergies, medications, and health status, including review of pertinent consultant reports was performed as part of comprehensive evaluation and provision of care management/care coordination services.   SDOH (Social Determinants of Health) assessments and interventions performed:  No    Advanced Directives Status: Not addressed in this encounter.  Care Plan  No Known Allergies  Outpatient Encounter Medications as of 03/16/2021  Medication Sig   amLODipine (NORVASC) 5 MG tablet Take 1 tablet (5 mg total) by mouth daily.   aspirin EC 325 MG EC tablet Take 1 tablet (325 mg total) by mouth daily. Take x 1 month post op to decrease risk of blood clots. (Patient taking differently: Take 325 mg by mouth daily.)   atorvastatin (LIPITOR) 10 MG tablet Take 1 tablet (10 mg total) by mouth daily.   escitalopram (LEXAPRO) 10 MG tablet TAKE 1 TABLET (10 MG TOTAL) BY  MOUTH DAILY.   ferrous sulfate 325 (65 FE) MG tablet Take 1 tablet (325 mg total) by mouth 2 (two) times daily with a meal.   glucose blood (ACCU-CHEK AVIVA PLUS) test strip Use as instructed   hydrocortisone cream 1 % Apply to affected area 2 times daily   Insulin Degludec-Liraglutide (XULTOPHY) 100-3.6 UNIT-MG/ML SOPN Inject 15 Units into the skin daily.   Insulin Pen Needle (BD PEN NEEDLE NANO U/F) 32G X 4 MM MISC USE FOR 1 INJECTION DAILY   Lancets (ONETOUCH DELICA PLUS LANCET33G) MISC USE TO CHECK BLOOD SUGAR ONCE A DAY AS INSTRUCTED   valsartan (DIOVAN) 160 MG tablet TAKE 1 TABLET (160 MG TOTAL) BY MOUTH DAILY.   Vitamin D, Ergocalciferol, (DRISDOL) 1.25 MG (50000 UNIT) CAPS capsule TAKE 1 CAPSULE (50,000 UNITS TOTAL) BY MOUTH EVERY 7 (SEVEN) DAYS.   No facility-administered encounter medications on file as of 03/16/2021.    Patient Active Problem List   Diagnosis Date Noted   BMI 36.0-36.9,adult 01/07/2021   CMV (cytomegalovirus) antibody positive 03/02/2020   Iron deficiency anemia 01/08/2020   Folate deficiency anemia 01/07/2020   Chronic back pain 01/07/2020   Tobacco dependence 01/07/2020   Hypertensive retinopathy of both eyes 02/18/2019   Combined form of age-related cataract, both eyes 02/18/2019   Closed displaced oblique fracture of shaft of left humerus 12/17/2018   Insomnia 06/08/2018   Essential hypertension 06/08/2018   Olecranon bursitis, left elbow 06/19/2017   Closed fracture of distal end of right fibula and tibia 06/10/2016   Diabetes (HCC) 06/10/2016   Traumatic closed displaced fracture of shaft of right tibia and fibula 06/10/2016  Conditions to be addressed/monitored: HTN, DMII, and Depression; Transportation  Care Plan : Social Work [facility]  Updates made by Bevelyn Ngo since 03/16/2021 12:00 AM  Completed 03/16/2021   Problem: Barriers to Treatment - Transportation Resolved 03/16/2021     Goal: Barriers to Treatment Identified and Managed Completed  03/16/2021  Start Date: 03/16/2021  Expected End Date: 03/26/2021  Priority: High  Note:   Current Barriers:  Chronic disease management support and education needs related to HTN, DM, and Depression  Transportation  Social Worker Clinical Goal(s):  Patient will work with SW to obtain transportation of upcomming mammogram appointment scheduled 8.17.22  SW Interventions:  Inter-disciplinary care team collaboration (see longitudinal plan of care) Collaboration with Kristin Felts, FNP regarding development and update of comprehensive plan of care as evidenced by provider attestation and co-signature Collaboration with Kristin Felts, FNP who indicates patient has experienced barriers with obtaining transportation to upcomming mammogram appointment Successful outbound call placed to the patient to assess for transportation resource needs Discussed the patient missed a past mammogram appointment due to difficulty arranging with [facility] Transportation service Advised the patient SW would submit a transportation request to upcomming appointment Discussed plans for patient to contact SW as needed Transportation request sent to [facility] transportation department for 8.17 appointment  Patient Goals/Self-Care Activities patient will:   Risk manager SW as needed -[employer] services for upcomming mammogram appointment  Follow Up Plan:  No SW follow up planned at this time. Please contact me as needed       Follow Up Plan:  No SW follow up planned at this time. The patient will remain engaged with RN Care Manager.      Bevelyn Ngo, BSW, CDP Social Worker, Certified Dementia Practitioner [facility] Care Management [phone]

## 2021-03-16 NOTE — Patient Instructions (Signed)
Visit Information   Goals Addressed             This Visit's Progress    COMPLETED: Transportation Barriers identified and managed       Timeframe:  Short-Term Goal Priority:  High           Patient Goals/Self-Care Activities patient will:   - Contact SW as needed -[employer] services for upcomming mammogram appointment         Patient verbalizes understanding of instructions provided today and agrees to view in [place].   Please call me as needed  Bevelyn Ngo, BSW, CDP Social Worker, Certified Dementia Practitioner [facility] Care Management [phone]

## 2021-03-21 ENCOUNTER — Ambulatory Visit: Payer: Self-pay

## 2021-03-21 DIAGNOSIS — E119 Type 2 diabetes mellitus without complications: Secondary | ICD-10-CM

## 2021-03-21 DIAGNOSIS — I10 Essential (primary) hypertension: Secondary | ICD-10-CM

## 2021-03-21 NOTE — Chronic Care Management (AMB) (Signed)
Social Work Note  03/21/2021 Name: [name] MRN: [id number] DOB: 1956-03-20  [name] is a 65 y.o. year old female who is a primary care patient of Kristin Felts, FNP.  The Care Management team was consulted for assistance with chronic disease management and care coordination needs.  [name] was given information about Care Management services today including:  Care Management services include personalized support from designated clinical staff supervised by her physician, including individualized plan of care and coordination with other care providers 24/7 contact phone numbers for assistance for urgent and routine care needs. The patient may stop care management services at any time (effective at the end of the month) by phone call to the office staff.  Patient agreed to services and consent obtained.   Engaged with patient by telephone for follow up visit in response to provider referral for social work chronic care management and care coordination services.  Received confirmation from [facility] Transportation they will transport patient to [facility] on August 17 for her mammogram appointment. Patients pick-up time is scheduled for 11:35 am. Successful outbound call placed to the patient to advise of pick-up time. No other SW needs identified at this time.  Assessment: Review of patient past medical history, allergies, medications, and health status, including review of pertinent consultant reports was performed as part of comprehensive evaluation and provision of care management/care coordination services.   SDOH (Social Determinants of Health) assessments and interventions performed:  No    Advanced Directives Status: Not addressed in this encounter.  Care Plan  No Known Allergies  Outpatient Encounter Medications as of 03/21/2021  Medication Sig   amLODipine (NORVASC) 5 MG tablet Take 1 tablet (5 mg total) by mouth daily.   aspirin EC 325 MG EC tablet  Take 1 tablet (325 mg total) by mouth daily. Take x 1 month post op to decrease risk of blood clots. (Patient taking differently: Take 325 mg by mouth daily.)   atorvastatin (LIPITOR) 10 MG tablet Take 1 tablet (10 mg total) by mouth daily.   escitalopram (LEXAPRO) 10 MG tablet TAKE 1 TABLET (10 MG TOTAL) BY MOUTH DAILY.   ferrous sulfate 325 (65 FE) MG tablet Take 1 tablet (325 mg total) by mouth 2 (two) times daily with a meal.   glucose blood (ACCU-CHEK AVIVA PLUS) test strip Use as instructed   hydrocortisone cream 1 % Apply to affected area 2 times daily   Insulin Degludec-Liraglutide (XULTOPHY) 100-3.6 UNIT-MG/ML SOPN Inject 15 Units into the skin daily.   Insulin Pen Needle (BD PEN NEEDLE NANO U/F) 32G X 4 MM MISC USE FOR 1 INJECTION DAILY   Lancets (ONETOUCH DELICA PLUS LANCET33G) MISC USE TO CHECK BLOOD SUGAR ONCE A DAY AS INSTRUCTED   valsartan (DIOVAN) 160 MG tablet TAKE 1 TABLET (160 MG TOTAL) BY MOUTH DAILY.   Vitamin D, Ergocalciferol, (DRISDOL) 1.25 MG (50000 UNIT) CAPS capsule TAKE 1 CAPSULE (50,000 UNITS TOTAL) BY MOUTH EVERY 7 (SEVEN) DAYS.   No facility-administered encounter medications on file as of 03/21/2021.    Patient Active Problem List   Diagnosis Date Noted   BMI 36.0-36.9,adult 01/07/2021   CMV (cytomegalovirus) antibody positive 03/02/2020   Iron deficiency anemia 01/08/2020   Folate deficiency anemia 01/07/2020   Chronic back pain 01/07/2020   Tobacco dependence 01/07/2020   Hypertensive retinopathy of both eyes 02/18/2019   Combined form of age-related cataract, both eyes 02/18/2019   Closed displaced oblique fracture of shaft of left humerus 12/17/2018  Insomnia 06/08/2018   Essential hypertension 06/08/2018   Olecranon bursitis, left elbow 06/19/2017   Closed fracture of distal end of right fibula and tibia 06/10/2016   Diabetes (HCC) 06/10/2016   Traumatic closed displaced fracture of shaft of right tibia and fibula 06/10/2016    Conditions to be  addressed/monitored: HTN, DMII, and Depression; Transportation  There are no care plans that you recently modified to display for this patient.    Follow Up Plan:  Encouraged patient to contact SW as needed.      [name], BSW, CDP Social Worker, Certified Dementia Practitioner [facility] Care Management [phone]

## 2021-03-27 ENCOUNTER — Other Ambulatory Visit: Payer: Self-pay | Admitting: Nurse Practitioner

## 2021-03-27 DIAGNOSIS — E559 Vitamin D deficiency, unspecified: Secondary | ICD-10-CM

## 2021-04-03 ENCOUNTER — Encounter (INDEPENDENT_AMBULATORY_CARE_PROVIDER_SITE_OTHER): Payer: BC Managed Care – PPO | Admitting: Ophthalmology

## 2021-04-03 ENCOUNTER — Other Ambulatory Visit: Payer: Self-pay

## 2021-04-03 DIAGNOSIS — H43813 Vitreous degeneration, bilateral: Secondary | ICD-10-CM

## 2021-04-03 DIAGNOSIS — I10 Essential (primary) hypertension: Secondary | ICD-10-CM

## 2021-04-03 DIAGNOSIS — E113512 Type 2 diabetes mellitus with proliferative diabetic retinopathy with macular edema, left eye: Secondary | ICD-10-CM

## 2021-04-03 DIAGNOSIS — E113311 Type 2 diabetes mellitus with moderate nonproliferative diabetic retinopathy with macular edema, right eye: Secondary | ICD-10-CM

## 2021-04-03 DIAGNOSIS — H34812 Central retinal vein occlusion, left eye, with macular edema: Secondary | ICD-10-CM

## 2021-04-03 DIAGNOSIS — H2513 Age-related nuclear cataract, bilateral: Secondary | ICD-10-CM

## 2021-04-03 DIAGNOSIS — H35033 Hypertensive retinopathy, bilateral: Secondary | ICD-10-CM

## 2021-04-12 ENCOUNTER — Telehealth: Payer: Self-pay

## 2021-04-12 ENCOUNTER — Telehealth: Payer: BC Managed Care – PPO

## 2021-04-12 NOTE — Telephone Encounter (Signed)
  Care Management   Follow Up Note   04/12/2021 Name: [name] MRN: [id number] DOB: 1956-05-04   Referred by: Arnette Felts, FNP Reason for referral : Care Coordination (RN CC Follow up call )   An unsuccessful telephone outreach was attempted today. The patient was referred to the case management team for assistance with care management and care coordination.   Follow Up Plan: A HIPPA compliant phone message was left for the patient providing contact information and requesting a return call.   Delsa Sale, RN, BSN, CCM Care Management Coordinator [facility] Care Management/[facility]  Direct Phone: [phone]

## 2021-04-26 ENCOUNTER — Ambulatory Visit
Admission: RE | Admit: 2021-04-26 | Discharge: 2021-04-26 | Disposition: A | Discharge status: Home / Self Care | Payer: Medicare PPO | Source: Ambulatory Visit | Attending: Nurse Practitioner | Admitting: Nurse Practitioner

## 2021-04-26 ENCOUNTER — Other Ambulatory Visit: Payer: Self-pay

## 2021-04-26 DIAGNOSIS — Z1231 Encounter for screening mammogram for malignant neoplasm of breast: Secondary | ICD-10-CM

## 2021-05-01 ENCOUNTER — Other Ambulatory Visit: Payer: Self-pay

## 2021-05-01 ENCOUNTER — Telehealth: Payer: Self-pay

## 2021-05-01 DIAGNOSIS — I2510 Atherosclerotic heart disease of native coronary artery without angina pectoris: Secondary | ICD-10-CM

## 2021-05-01 DIAGNOSIS — F3289 Other specified depressive episodes: Secondary | ICD-10-CM

## 2021-05-01 MED ORDER — ATORVASTATIN CALCIUM 10 MG PO TABS: 10.0000 mg | ORAL_TABLET | Freq: Every day | ORAL | 11 refills | Status: DC

## 2021-05-01 MED ORDER — VALSARTAN 160 MG PO TABS: 160.0000 mg | ORAL_TABLET | Freq: Every day | ORAL | 1 refills | Status: DC

## 2021-05-01 MED ORDER — ESCITALOPRAM OXALATE 10 MG PO TABS: 10.0000 mg | ORAL_TABLET | Freq: Every day | ORAL | 3 refills | Status: DC

## 2021-05-01 MED ORDER — XULTOPHY 100-3.6 UNIT-MG/ML ~~LOC~~ SOPN
15.0000 [IU] | PEN_INJECTOR | Freq: Every day | SUBCUTANEOUS | 1 refills | Status: DC
Start: 2021-05-01 — End: 2021-08-24

## 2021-05-01 NOTE — Telephone Encounter (Signed)
The pt was notified to contact [facility] and make sure that she's using the correct pharmacy to get her meds filled. The pt said that her [name] is $80 and that she's on medicare.  The pt is going to call the number on her benefits card and call the office back.

## 2021-05-03 ENCOUNTER — Encounter (INDEPENDENT_AMBULATORY_CARE_PROVIDER_SITE_OTHER): Payer: Medicare PPO | Admitting: Ophthalmology

## 2021-05-03 ENCOUNTER — Other Ambulatory Visit: Payer: Self-pay

## 2021-05-03 DIAGNOSIS — E113391 Type 2 diabetes mellitus with moderate nonproliferative diabetic retinopathy without macular edema, right eye: Secondary | ICD-10-CM | POA: Diagnosis not present

## 2021-05-03 DIAGNOSIS — H34812 Central retinal vein occlusion, left eye, with macular edema: Secondary | ICD-10-CM

## 2021-05-03 DIAGNOSIS — E113512 Type 2 diabetes mellitus with proliferative diabetic retinopathy with macular edema, left eye: Secondary | ICD-10-CM

## 2021-05-03 DIAGNOSIS — H43813 Vitreous degeneration, bilateral: Secondary | ICD-10-CM

## 2021-05-03 DIAGNOSIS — I10 Essential (primary) hypertension: Secondary | ICD-10-CM

## 2021-05-03 DIAGNOSIS — H35033 Hypertensive retinopathy, bilateral: Secondary | ICD-10-CM

## 2021-05-03 DIAGNOSIS — H2513 Age-related nuclear cataract, bilateral: Secondary | ICD-10-CM

## 2021-05-04 ENCOUNTER — Telehealth: Payer: Medicare PPO

## 2021-05-04 ENCOUNTER — Ambulatory Visit (INDEPENDENT_AMBULATORY_CARE_PROVIDER_SITE_OTHER): Payer: Medicare PPO

## 2021-05-04 DIAGNOSIS — I10 Essential (primary) hypertension: Secondary | ICD-10-CM | POA: Diagnosis not present

## 2021-05-04 DIAGNOSIS — E119 Type 2 diabetes mellitus without complications: Secondary | ICD-10-CM

## 2021-05-04 DIAGNOSIS — F32A Depression, unspecified: Secondary | ICD-10-CM | POA: Diagnosis not present

## 2021-05-04 DIAGNOSIS — E559 Vitamin D deficiency, unspecified: Secondary | ICD-10-CM

## 2021-05-05 ENCOUNTER — Other Ambulatory Visit: Payer: Self-pay | Admitting: Nurse Practitioner

## 2021-05-05 DIAGNOSIS — F3289 Other specified depressive episodes: Secondary | ICD-10-CM

## 2021-05-11 NOTE — Patient Instructions (Signed)
Goals Addressed      Impaired Vision - treatment optimized   On track    Timeframe:  Long-Range Goal Priority:  High Start Date:  01/26/21                           Expected End Date:  01/26/22  Next Scheduled Follow up date: 08/17/21           Patient Goals: - continue to follow MD recommendations for eye care - maintain independence and self care             Monitor and Manage My Blood Sugar-Diabetes Type 2   On track    Timeframe:  Long-Range Goal Priority:  Medium Start Date:  01/26/21                          Expected End Date:  01/26/22                     Follow Up Date: 08/17/21   - check blood sugar at prescribed times - check blood sugar before and after exercise - check blood sugar if I feel it is too high or too low - enter blood sugar readings and medication or insulin into daily log - take the blood sugar log to all doctor visits - take the blood sugar meter to all doctor visits    Why is this important?   Checking your blood sugar at home helps to keep it from getting very high or very low.  Writing the results in a diary or log helps the doctor know how to care for you.  Your blood sugar log should have the time, date and the results.  Also, write down the amount of insulin or other medicine that you take.  Other information, like what you ate, exercise done and how you were feeling, will also be helpful.     Notes:      Response to treatment - Depression   On track    Timeframe:  Long-Range Goal Priority:  High Start Date:  01/26/21                          Expected End Date: 01/26/22  Next Scheduled Follow up date: 08/17/21  Patient Goals: - continue to use prayer and meditation to help with effectiven coping - continue to balance activity with rest, get at least 8 hours of sleep each night, and eat well balanced diet - drink at least 64 oz of water daily  - notify your PCP promptly for new or worsening symptoms of depression/anxiety      Track and Manage  My Blood Pressure-Hypertension   On track    Timeframe:  Long-Range Goal Priority:  Medium Start Date:  01/26/21                           Expected End Date:  01/26/22                     Follow Up Date: 08/17/21   - check blood pressure 3 times per week - choose a place to take my blood pressure (home, clinic or office, retail store) - write blood pressure results in a log or diary    Why is this important?   You won't feel high blood  pressure, but it can still hurt your blood vessels.  High blood pressure can cause heart or kidney problems. It can also cause a stroke.  Making lifestyle changes like losing a Demauri Advincula weight or eating less salt will help.  Checking your blood pressure at home and at different times of the day can help to control blood pressure.  If the doctor prescribes medicine remember to take it the way the doctor ordered.  Call the office if you cannot afford the medicine or if there are questions about it.     Notes:

## 2021-05-11 NOTE — Chronic Care Management (AMB) (Signed)
Chronic Care Management    RN Visit Note  05/04/2021 Name: [name] MRN: [id number] DOB: [birth year]  Subjective: [name] is a 65 y.o. year old female who is a primary care patient of [name], [place]. The care management team was consulted for assistance with disease management and care coordination needs.    Engaged with patient by telephone for follow up visit in response to provider referral for case management and/or care coordination services.   Consent to Services:  The patient was given information about Chronic Care Management services, agreed to services, and gave verbal consent prior to initiation of services.  Please see initial visit note for detailed documentation.   Patient agreed to services and verbal consent obtained.   Assessment: Review of patient past medical history, allergies, medications, health status, including review of consultants reports, laboratory and other test data, was performed as part of comprehensive evaluation and provision of chronic care management services.   SDOH (Social Determinants of Health) assessments and interventions performed:  Yes, no acute challenges   Care Plan  No Known Allergies  Outpatient Encounter Medications as of 05/04/2021  Medication Sig   amLODipine (NORVASC) 5 MG tablet Take 1 tablet (5 mg total) by mouth daily.   aspirin EC 325 MG EC tablet Take 1 tablet (325 mg total) by mouth daily. Take x 1 month post op to decrease risk of blood clots. (Patient taking differently: Take 325 mg by mouth daily.)   atorvastatin (LIPITOR) 10 MG tablet Take 1 tablet (10 mg total) by mouth daily.   ferrous sulfate 325 (65 FE) MG tablet Take 1 tablet (325 mg total) by mouth 2 (two) times daily with a meal.   glucose blood (ACCU-CHEK AVIVA PLUS) test strip Use as instructed   hydrocortisone cream 1 % Apply to affected area 2 times daily   Insulin Degludec-Liraglutide (XULTOPHY) 100-3.6 UNIT-MG/ML SOPN Inject 15 Units into  the skin daily.   Insulin Pen Needle (BD PEN NEEDLE NANO U/F) 32G X 4 MM MISC USE FOR 1 INJECTION DAILY   Lancets (ONETOUCH DELICA PLUS [id number]) MISC USE TO CHECK BLOOD SUGAR ONCE A DAY AS INSTRUCTED   Vitamin D, Ergocalciferol, (DRISDOL) 1.25 MG (50000 UNIT) CAPS capsule TAKE 1 CAPSULE (50,000 UNITS TOTAL) BY MOUTH EVERY 7 (SEVEN) DAYS.   [DISCONTINUED] escitalopram (LEXAPRO) 10 MG tablet Take 1 tablet (10 mg total) by mouth daily.   [DISCONTINUED] valsartan (DIOVAN) 160 MG tablet Take 1 tablet (160 mg total) by mouth daily.   No facility-administered encounter medications on file as of 05/04/2021.    Patient Active Problem List   Diagnosis Date Noted   BMI 36.0-36.9,adult 01/07/2021   CMV (cytomegalovirus) antibody positive 03/02/2020   Iron deficiency anemia 01/08/2020   Folate deficiency anemia 01/07/2020   Chronic back pain 01/07/2020   Tobacco dependence 01/07/2020   Hypertensive retinopathy of both eyes 02/18/2019   Combined form of age-related cataract, both eyes 02/18/2019   Closed displaced oblique fracture of shaft of left humerus 12/17/2018   Insomnia 06/08/2018   Essential hypertension 06/08/2018   Olecranon bursitis, left elbow 06/19/2017   Closed fracture of distal end of right fibula and tibia 06/10/2016   Diabetes ([place]) 06/10/2016   Traumatic closed displaced fracture of shaft of right tibia and fibula 06/10/2016    Conditions to be addressed/monitored: HTN, DMII, Depression, and Vitamin D deficiency   Care Plan : Depression (Adult)  Updates made by Lynne Logan, RN since 05/04/2021 12:00 AM  Problem: Response to Treatment (Depression)   Priority: High     Long-Range Goal: Response to Treatment Maximized   Start Date: 01/26/2021  Expected End Date: 01/26/2022  Recent Progress: On track  Priority: High  Note:   Current Barriers:  Ineffective Self Health Maintenance  Financial Restraints   Clinical Goal(s):  Collaboration with [name], FNP  regarding development and update of comprehensive plan of care as evidenced by provider attestation and co-signature Inter-disciplinary care team collaboration (see longitudinal plan of care) patient will work with care management team to address care coordination and chronic disease management needs related to Disease Management Educational Needs Care Coordination Medication Management and Education Psychosocial Support   Interventions:  05/04/21 completed successful outbound call with patient  Evaluation of current treatment plan related to Anxiety and Depression, self-management and patient's adherence to plan as established by provider. Collaboration with [name], FNP regarding development and update of comprehensive plan of care as evidenced by provider attestation       and co-signature Inter-disciplinary care team collaboration (see longitudinal plan of care) Provided education to patient about basic disease process related to depression and anxiety Review of patient status, including review of consultant's reports, relevant laboratory and other test results, and medications completed. Reviewed medications with patient and discussed importance of medication adherence Determined patient feels her overall health has improved and this helping her feel less depressed and anxious Determined patient uses prayer, meditation and the support of her church and friend group to help you cope  Instructed patient to report worsening signs/symptoms of depression promptly  Educated patient on the importance of balancing activity with rest, try to get at least 8 hrs of sleep each night while staying on the same schedule, try to exercise at least 30 minutes a day, 5 days each week, eat a well balanced diet, getting at least 15 minutes of sunshine each day when able and drink at least 64 oz of water daily unless otherwise directed  Discussed plans with patient for ongoing care management follow up and  provided patient with direct contact information for care management team Self Care Activities:  Continue to keep all scheduled follow up appointments Take medications as directed  Let your healthcare team know if you are unable to take your medications Call your pharmacy for refills at least 7 days prior to running out of medication Increase your water intake to 64 oz per day unless otherwise directed Get at least 8 hrs of sleep each night and try to get at least 15 minutes of natural sunlight each day when possible  Report persistent or worsening signs/symptoms of depression to your PCP promptly Patient Goals: - continue to use prayer and meditation to help with effectiven coping - continue to balance activity with rest, get at least 8 hours of sleep each night, and eat well balanced diet - drink at least 64 oz of water daily  - notify your PCP promptly for new or worsening symptoms of depression/anxiety   Follow Up Plan: Telephone follow up appointment with care management team member scheduled for: 08/17/21      Care Plan : Diabetes Type 2 (Adult)  Updates made by Lynne Logan, RN since 05/04/2021 12:00 AM     Problem: Glycemic Management (Diabetes, Type 2)   Priority: Medium     Long-Range Goal: Glycemic Management Optimized   Start Date: 01/26/2021  Expected End Date: 01/26/2022  Recent Progress: On track  Priority: Medium  Note:   Objective:  Lab Results  Component Value Date   HGBA1C 5.9 (H) 03/06/2021   Lab Results  Component Value Date   CREATININE 0.84 03/06/2021   CREATININE 0.79 11/16/2020   CREATININE 1.06 (H) 08/11/2020   Lab Results  Component Value Date   EGFR 78 03/06/2021   Current Barriers:  Knowledge Deficits related to basic Diabetes pathophysiology and self care/management Knowledge Deficits related to medications used for management of diabetes Financial Restraints Case Manager Clinical Goal(s):  patient will demonstrate improved adherence  to prescribed treatment plan for diabetes self care/management as evidenced by: daily monitoring and recording of CBG  adherence to ADA/ carb modified diet exercise 5 days/week adherence to prescribed medication regimen contacting provider for new or worsened symptoms or questions Interventions:  05/04/21 completed successful outbound call with patient  Collaboration with [name], [place] regarding development and update of comprehensive plan of care as evidenced by provider attestation and co-signature Inter-disciplinary care team collaboration (see longitudinal plan of care) Provided education to patient about basic DM disease process Review of patient status, including review of consultants reports, relevant laboratory and other test results, and medications completed. Reviewed medications with patient and discussed importance of medication adherence Educated patient on dietary and exercise recommendations; daily glycemic control FBS 80-130, <180 after meals;15'15' rule Advised patient, providing education and rationale, to check cbg before breakfast and at bedtime and record, calling PCP and or RNCM for findings outside established parameters.   Mailed printed educational materials related to Diabetes management  Discussed plans with patient for ongoing care management follow up and provided patient with direct contact information for care management team Self-Care Activities Self administers oral medications as prescribed Self administers insulin as prescribed Attends all scheduled provider appointments Checks blood sugars as prescribed and utilize hyper and hypoglycemia protocol as needed Adheres to prescribed ADA/carb modified Patient Goals: - check blood sugar at prescribed times - check blood sugar before and after exercise - check blood sugar if I feel it is too high or too low - enter blood sugar readings and medication or insulin into daily log - take the blood sugar log to all  doctor visits - take the blood sugar meter to all doctor visits - manage portion size - keep appointment with eye doctor - schedule appointment with eye doctor - check feet daily for cuts, sores or redness - keep feet up while sitting - trim toenails straight across - wash and dry feet carefully every day - wear comfortable, cotton socks - wear comfortable, well-fitting shoes  Follow Up Plan: Telephone follow up appointment with care management team member scheduled for: 08/17/21     Care Plan : Hypertension (Adult)  Updates made by Lynne Logan, RN since 05/04/2021 12:00 AM     Problem: Hypertension (Hypertension)   Priority: Medium     Long-Range Goal: Hypertension Monitored   Start Date: 01/26/2021  Expected End Date: 01/26/2022  Recent Progress: On track  Priority: Medium  Note:   Objective:  Last practice recorded BP readings:  BP Readings from Last 3 Encounters:  03/06/21 122/78  12/21/20 118/62  11/16/20 130/68   Most recent eGFR/CrCl:  Lab Results  Component Value Date   EGFR 78 03/06/2021    No components found for: CRCL Current Barriers:  Knowledge Deficits related to basic understanding of hypertension pathophysiology and self care management Knowledge Deficits related to understanding of medications prescribed for management of hypertension Financial Constraints.  Case Manager Clinical Goal(s):  patient will demonstrate improved adherence  to prescribed treatment plan for hypertension as evidenced by taking all medications as prescribed, monitoring and recording blood pressure as directed, adhering to low sodium/DASH diet Interventions:  05/04/21 completed successful outbound call with patient  Collaboration with [name], [place] regarding development and update of comprehensive plan of care as evidenced by provider attestation and co-signature Inter-disciplinary care team collaboration (see longitudinal plan of care) Evaluation of current treatment plan  related to hypertension self management and patient's adherence to plan as established by provider. Provided education to patient re: stroke prevention, s/s of heart attack and stroke, DASH diet, complications of uncontrolled blood pressure Reviewed medications with patient and discussed importance of compliance Advised patient, providing education and rationale, to monitor blood pressure daily and record, calling PCP for findings outside established parameters.  Mailed printed educational materials related Why Should I Lower Sodium? Discussed plans with patient for ongoing care management follow up and provided patient with direct contact information for care management team Self-Care Activities: Self administers medications as prescribed Attends all scheduled provider appointments Calls provider office for new concerns, questions, or BP outside discussed parameters Checks BP and records as discussed Follows a low sodium diet/DASH diet Patient Goals: - check blood pressure 3 times per week - choose a place to take my blood pressure (home, clinic or office, retail store) - write blood pressure results in a log or diary - learn about high blood pressure  Follow Up Plan: Telephone follow up appointment with care management team member scheduled for: 08/17/21    Care Plan : Impaired Vision  Updates made by Lynne Logan, RN since 05/04/2021 12:00 AM     Problem: Impaired Vision   Priority: High     Long-Range Goal: Impaired Vision - treatment optimized   Start Date: 01/26/2021  Expected End Date: 01/26/2022  Recent Progress: On track  Priority: High  Note:   Current Barriers:  Ineffective Self Health Maintenance  Financial restraints  Clinical Goal(s):  Collaboration with [name], FNP regarding development and update of comprehensive plan of care as evidenced by provider attestation and co-signature Inter-disciplinary care team collaboration (see longitudinal plan of  care) patient will work with care management team to address care coordination and chronic disease management needs related to Disease Management Educational Needs Care Coordination Medication Management and Education Psychosocial Support   Interventions:  05/04/21 completed successful outbound call with patient  Evaluation of current treatment plan related to  Impaired Vision , self-management and patient's adherence to plan as established by provider. Collaboration with [name], FNP regarding development and update of comprehensive plan of care as evidenced by provider attestation       and co-signature Inter-disciplinary care team collaboration (see longitudinal plan of care) Provided education to patient about basic disease process related to Cataracts including etiology and standards of care Review of patient status, including review of consultant's reports, relevant laboratory and other test results, and medications completed. Reviewed medications with patient and discussed importance of medication adherence Determined patient continues to follow her Eye Specialist for injections to lower her pressures secondary to glaucoma and for ongoing monitoring of cataracts  Discussed plans with patient for ongoing care management follow up and provided patient with direct contact information for care management team Self Care Activities:  Continue to keep all scheduled follow up appointments Take medications as directed  Let your healthcare team know if you are unable to take your medications Call your pharmacy for refills at least 7 days prior to running  out of medication Patient Goals: - continue to follow MD recommendations for eye care - maintain independence and self care   Follow Up Plan: Telephone follow up appointment with care management team member scheduled for: 08/17/21     Plan: Telephone follow up appointment with care management team member scheduled for:   08/17/21  Barb Merino, RN, BSN, CCM Care Management Coordinator [place] Management/[facility]  Direct Phone: [phone]

## 2021-05-24 ENCOUNTER — Other Ambulatory Visit: Payer: Self-pay

## 2021-05-24 ENCOUNTER — Encounter (INDEPENDENT_AMBULATORY_CARE_PROVIDER_SITE_OTHER): Payer: Medicare PPO | Admitting: Ophthalmology

## 2021-05-24 DIAGNOSIS — E113512 Type 2 diabetes mellitus with proliferative diabetic retinopathy with macular edema, left eye: Secondary | ICD-10-CM | POA: Diagnosis not present

## 2021-06-02 ENCOUNTER — Encounter (INDEPENDENT_AMBULATORY_CARE_PROVIDER_SITE_OTHER): Payer: Medicare PPO | Admitting: Ophthalmology

## 2021-06-02 ENCOUNTER — Other Ambulatory Visit: Payer: Self-pay

## 2021-06-02 DIAGNOSIS — E113391 Type 2 diabetes mellitus with moderate nonproliferative diabetic retinopathy without macular edema, right eye: Secondary | ICD-10-CM | POA: Diagnosis not present

## 2021-06-02 DIAGNOSIS — H34812 Central retinal vein occlusion, left eye, with macular edema: Secondary | ICD-10-CM | POA: Diagnosis not present

## 2021-06-02 DIAGNOSIS — I10 Essential (primary) hypertension: Secondary | ICD-10-CM | POA: Diagnosis not present

## 2021-06-02 DIAGNOSIS — E113512 Type 2 diabetes mellitus with proliferative diabetic retinopathy with macular edema, left eye: Secondary | ICD-10-CM

## 2021-06-02 DIAGNOSIS — H35033 Hypertensive retinopathy, bilateral: Secondary | ICD-10-CM

## 2021-06-02 DIAGNOSIS — H43813 Vitreous degeneration, bilateral: Secondary | ICD-10-CM

## 2021-06-07 ENCOUNTER — Encounter: Payer: Self-pay | Admitting: Nurse Practitioner

## 2021-06-07 ENCOUNTER — Ambulatory Visit (INDEPENDENT_AMBULATORY_CARE_PROVIDER_SITE_OTHER): Payer: Medicare PPO | Admitting: Nurse Practitioner

## 2021-06-07 ENCOUNTER — Other Ambulatory Visit: Payer: Self-pay

## 2021-06-07 VITALS — BP 132/78 | HR 78 | Temp 98.5°F | Ht 60.6 in | Wt 183.0 lb

## 2021-06-07 DIAGNOSIS — Z1211 Encounter for screening for malignant neoplasm of colon: Secondary | ICD-10-CM

## 2021-06-07 DIAGNOSIS — H2589 Other age-related cataract: Secondary | ICD-10-CM

## 2021-06-07 DIAGNOSIS — I10 Essential (primary) hypertension: Secondary | ICD-10-CM | POA: Diagnosis not present

## 2021-06-07 DIAGNOSIS — W19XXXA Unspecified fall, initial encounter: Secondary | ICD-10-CM

## 2021-06-07 DIAGNOSIS — E2839 Other primary ovarian failure: Secondary | ICD-10-CM

## 2021-06-07 DIAGNOSIS — Z23 Encounter for immunization: Secondary | ICD-10-CM

## 2021-06-07 DIAGNOSIS — E119 Type 2 diabetes mellitus without complications: Secondary | ICD-10-CM

## 2021-06-07 DIAGNOSIS — R2681 Unsteadiness on feet: Secondary | ICD-10-CM

## 2021-06-07 MED ORDER — SHINGRIX 50 MCG/0.5ML IM SUSR: 0.5000 mL | Freq: Once | INTRAMUSCULAR | 0 refills | Status: AC

## 2021-06-07 MED ORDER — TETANUS-DIPHTH-ACELL PERTUSSIS 5-2.5-18.5 LF-MCG/0.5 IM SUSP: 0.5000 mL | Freq: Once | INTRAMUSCULAR | 0 refills | Status: AC

## 2021-06-07 NOTE — Patient Instructions (Signed)

## 2021-06-07 NOTE — Progress Notes (Signed)
I,[name],acting as a Education administrator for [employer], FNP.,have documented all relevant documentation on the behalf of Kristin Brine, FNP,as directed by  Kristin Brine, FNP while in the presence of [name], [place].  This visit occurred during the SARS-CoV-2 public health emergency.  Safety protocols were in place, including screening questions prior to the visit, additional usage of staff PPE, and extensive cleaning of exam room while observing appropriate contact time as indicated for disinfecting solutions.  Subjective:     Patient ID: [name] , female    DOB: [birth year] , 65 y.o.   MRN: [id number]   Chief Complaint  Patient presents with   Hypertension   Diabetes    HPI  Patient presents today for a diabetes and blood pressure f/u. She has been without the medication for Sertraline due to not having the money. She has just started on Medicare.   Hypertension This is a chronic problem. The current episode started more than 1 year ago. The problem is uncontrolled. Pertinent negatives include no blurred vision, chest pain, headaches or palpitations. There are no associated agents to hypertension. Risk factors for coronary artery disease include sedentary lifestyle, obesity and diabetes mellitus.  Diabetes She presents for her follow-up diabetic visit. She has type 2 diabetes mellitus. Her disease course has been worsening. There are no hypoglycemic associated symptoms. Pertinent negatives for hypoglycemia include no dizziness or headaches. Pertinent negatives for diabetes include no blurred vision, no chest pain, no fatigue, no polydipsia, no polyphagia and no polyuria. There are no hypoglycemic complications. Symptoms are improving. There are no diabetic complications. Risk factors for coronary artery disease include sedentary lifestyle and obesity. Current diabetic treatment includes oral agent (dual therapy) (15 units Xultophy daily). She is compliant with treatment most of the  time. Her weight is stable. She is following a generally healthy diet. When asked about meal planning, she reported none. She has not had a previous visit with a dietitian. She rarely participates in exercise. (Blood sugar ranging 128-140) An ACE inhibitor/angiotensin II receptor blocker is not being taken. Eye exam is not current (she is having eye treatment with Dr. Zigmund Daniel for cataract, Dr.Groat is her opthalmologist).    Past Medical History:  Diagnosis Date   Anemia 01/07/2020   REQUIRING TRANSFUSION   Back pain    Diabetes mellitus without complication (HCC)    GERD (gastroesophageal reflux disease)    Hypertension    Olecranon bursitis of left elbow      Family History  Problem Relation Age of Onset   Diabetes Mother    Hypertension Mother    Diabetes Father    Hypertension Father    Breast cancer Sister    Breast cancer Sister    Breast cancer Paternal Aunt      Current Outpatient Medications:    Tdap (BOOSTRIX) 5-2.5-18.5 LF-MCG/0.5 injection, Inject 0.5 mLs into the muscle once for 1 dose., Disp: 0.5 mL, Rfl: 0   Zoster Vaccine Adjuvanted (SHINGRIX) injection, Inject 0.5 mLs into the muscle once for 1 dose., Disp: 0.5 mL, Rfl: 0   amLODipine (NORVASC) 5 MG tablet, Take 1 tablet (5 mg total) by mouth daily., Disp: 90 tablet, Rfl: 1   aspirin EC 325 MG EC tablet, Take 1 tablet (325 mg total) by mouth daily. Take x 1 month post op to decrease risk of blood clots. (Patient taking differently: Take 325 mg by mouth daily.), Disp: 30 tablet, Rfl: 0   atorvastatin (LIPITOR) 10 MG tablet, Take 1 tablet (10  mg total) by mouth daily., Disp: 30 tablet, Rfl: 11   escitalopram (LEXAPRO) 10 MG tablet, TAKE 1 TABLET (10 MG TOTAL) BY MOUTH DAILY., Disp: 30 tablet, Rfl: 3   ferrous sulfate 325 (65 FE) MG tablet, Take 1 tablet (325 mg total) by mouth 2 (two) times daily with a meal., Disp: 60 tablet, Rfl: 1   glucose blood (ACCU-CHEK AVIVA PLUS) test strip, Use as instructed, Disp: 100 each,  Rfl: 2   hydrocortisone cream 1 %, Apply to affected area 2 times daily, Disp: 30 g, Rfl: 3   Insulin Degludec-Liraglutide (XULTOPHY) 100-3.6 UNIT-MG/ML SOPN, Inject 15 Units into the skin daily., Disp: 5 mL, Rfl: 1   Insulin Pen Needle (BD PEN NEEDLE NANO U/F) 32G X 4 MM MISC, USE FOR 1 INJECTION DAILY, Disp: 100 each, Rfl: 1   Lancets (ONETOUCH DELICA PLUS ENMMHW80S) MISC, USE TO CHECK BLOOD SUGAR ONCE A DAY AS INSTRUCTED, Disp: 100 each, Rfl: 2   valsartan (DIOVAN) 160 MG tablet, TAKE 1 TABLET (160 MG TOTAL) BY MOUTH DAILY., Disp: 90 tablet, Rfl: 1   Vitamin D, Ergocalciferol, (DRISDOL) 1.25 MG (50000 UNIT) CAPS capsule, TAKE 1 CAPSULE (50,000 UNITS TOTAL) BY MOUTH EVERY 7 (SEVEN) DAYS., Disp: 12 capsule, Rfl: 0   No Known Allergies   Review of Systems  Constitutional:  Negative for fatigue.  Eyes:  Negative for blurred vision.  Respiratory: Negative.    Cardiovascular:  Negative for chest pain, palpitations and leg swelling.  Endocrine: Negative for polydipsia, polyphagia and polyuria.  Musculoskeletal:        Reports having a fall last week while outside.   Neurological:  Negative for dizziness and headaches.  Psychiatric/Behavioral: Negative.      Today's Vitals   06/07/21 1523  BP: 132/78  Pulse: 78  Temp: 98.5 F (36.9 C)  TempSrc: Oral  Weight: 183 lb (83 kg)  Height: 5' 0.6" (1.539 m)  PainSc: 7   PainLoc: Back   Body mass index is 35.04 kg/m.  Wt Readings from Last 3 Encounters:  06/07/21 183 lb (83 kg)  03/06/21 186 lb 12.8 oz (84.7 kg)  12/21/20 189 lb (85.7 kg)    BP Readings from Last 3 Encounters:  06/07/21 132/78  03/06/21 122/78  12/21/20 118/62    Objective:  Physical Exam Constitutional:      General: She is not in acute distress.    Appearance: Normal appearance. She is obese.  Cardiovascular:     Rate and Rhythm: Normal rate and regular rhythm.     Pulses: Normal pulses.     Heart sounds: Normal heart sounds. No murmur heard. Pulmonary:      Effort: Pulmonary effort is normal. No respiratory distress.     Breath sounds: Normal breath sounds. No wheezing.  Musculoskeletal:     Cervical back: Normal range of motion and neck supple.     Comments: Using rollator walker and is leaning over it  Skin:    General: Skin is warm and dry.     Capillary Refill: Capillary refill takes less than 2 seconds.     Findings: No lesion.  Neurological:     General: No focal deficit present.     Mental Status: She is alert and oriented to person, place, and time.  Psychiatric:        Mood and Affect: Mood normal.        Behavior: Behavior normal.        Thought Content: Thought content normal.  Judgment: Judgment normal.        Assessment And Plan:     1. Type 2 diabetes mellitus without complication, without long-term current use of insulin ([place]) Comments: Stable, tolerating Xultophy well Will refer to [facility] pharmacist to assist with patient assistance. - BMP8+EGFR - Hemoglobin A1c - AMB Referral to [facility]  2. Essential hypertension Comments: Good control, continue current medications - BMP8+EGFR - AMB Referral to [facility]  3. Encounter for immunization - Tdap ([place]) 5-2.5-18.5 LF-MCG/0.5 injection; Inject 0.5 mLs into the muscle once for 1 dose.  Dispense: 0.5 mL; Refill: 0 - Zoster Vaccine Adjuvanted [facility]) injection; Inject 0.5 mLs into the muscle once for 1 dose.  Dispense: 0.5 mL; Refill: 0  4. Encounter for screening colonoscopy  5. Other age-related cataract of left eye - Ambulatory referral to Gastroenterology  6. Decreased estrogen level - DG Bone Density; Future  7. Gait instability Comments: Advised her to stand up straight when walking with the rollator walker, will also order PT for strength, stability and ensure her rollator walker is tall enough - Ambulatory referral to Physical Therapy  8. Fall, initial encounter Comments: Has a scrape to her left shin,  was not using her rolling walker at that time.  Also leans on her rollator walker    Patient was given opportunity to ask questions. Patient verbalized understanding of the plan and was able to repeat key elements of the plan. All questions were answered to their satisfaction.  Kristin Brine, FNP   I, Kristin Brine, FNP, have reviewed all documentation for this visit. The documentation on 06/07/21 for the exam, diagnosis, procedures, and orders are all accurate and complete.   IF YOU HAVE BEEN REFERRED TO A SPECIALIST, IT MAY TAKE 1-2 WEEKS TO SCHEDULE/PROCESS THE REFERRAL. IF YOU HAVE NOT HEARD FROM US/SPECIALIST IN TWO WEEKS, PLEASE GIVE [place] A CALL AT [phone].   THE PATIENT IS ENCOURAGED TO PRACTICE SOCIAL DISTANCING DUE TO THE COVID-19 PANDEMIC.

## 2021-06-08 ENCOUNTER — Telehealth: Payer: Self-pay | Admitting: *Deleted

## 2021-06-08 LAB — BMP8+EGFR
BUN/Creatinine Ratio: 21 (ref 12–28)
BUN: 22 mg/dL (ref 8–27)
CO2: 23 mmol/L (ref 20–29)
Calcium: 9.4 mg/dL (ref 8.7–10.3)
Chloride: 106 mmol/L (ref 96–106)
Creatinine, Ser: 1.07 mg/dL — ABNORMAL HIGH (ref 0.57–1.00)
Glucose: 106 mg/dL — ABNORMAL HIGH (ref 70–99)
Potassium: 4.2 mmol/L (ref 3.5–5.2)
Sodium: 144 mmol/L (ref 134–144)
eGFR: 58 mL/min/{1.73_m2} — ABNORMAL LOW (ref >59)

## 2021-06-08 LAB — HEMOGLOBIN A1C
Est. average glucose Bld gHb Est-mCnc: 126 mg/dL
Hgb A1c MFr Bld: 6 % — ABNORMAL HIGH (ref 4.8–5.6)

## 2021-06-08 NOTE — Chronic Care Management (AMB) (Signed)
  Chronic Care Management   Note  06/08/2021 Name: [name] MRN: [id number] DOB: 1956/09/08  [name] is a 65 y.o. year old female who is a primary care patient of Arnette Felts, FNP. [name] is currently enrolled in care management services. An additional referral for Pharmacy was placed.   Follow up plan: Telephone appointment with care management team member scheduled for:07-11-21  [facility] Guide, Embedded Care Coordination [facility]  Care Management  Direct Dial: [phone]

## 2021-06-29 ENCOUNTER — Ambulatory Visit: Payer: Medicare PPO | Admitting: Nurse Practitioner

## 2021-07-03 ENCOUNTER — Other Ambulatory Visit: Payer: Self-pay

## 2021-07-03 ENCOUNTER — Encounter (INDEPENDENT_AMBULATORY_CARE_PROVIDER_SITE_OTHER): Payer: Medicare PPO | Admitting: Ophthalmology

## 2021-07-03 DIAGNOSIS — H34812 Central retinal vein occlusion, left eye, with macular edema: Secondary | ICD-10-CM | POA: Diagnosis not present

## 2021-07-03 DIAGNOSIS — I10 Essential (primary) hypertension: Secondary | ICD-10-CM

## 2021-07-03 DIAGNOSIS — H35033 Hypertensive retinopathy, bilateral: Secondary | ICD-10-CM

## 2021-07-03 DIAGNOSIS — E113512 Type 2 diabetes mellitus with proliferative diabetic retinopathy with macular edema, left eye: Secondary | ICD-10-CM

## 2021-07-03 DIAGNOSIS — E113211 Type 2 diabetes mellitus with mild nonproliferative diabetic retinopathy with macular edema, right eye: Secondary | ICD-10-CM

## 2021-07-03 DIAGNOSIS — H43813 Vitreous degeneration, bilateral: Secondary | ICD-10-CM

## 2021-07-04 ENCOUNTER — Ambulatory Visit: Payer: Medicare PPO | Admitting: Nurse Practitioner

## 2021-07-06 ENCOUNTER — Other Ambulatory Visit: Payer: Self-pay | Admitting: Nurse Practitioner

## 2021-07-06 DIAGNOSIS — I10 Essential (primary) hypertension: Secondary | ICD-10-CM

## 2021-07-11 ENCOUNTER — Telehealth: Payer: Self-pay

## 2021-07-11 ENCOUNTER — Ambulatory Visit (INDEPENDENT_AMBULATORY_CARE_PROVIDER_SITE_OTHER): Payer: Medicare PPO

## 2021-07-11 DIAGNOSIS — F32A Depression, unspecified: Secondary | ICD-10-CM

## 2021-07-11 DIAGNOSIS — I10 Essential (primary) hypertension: Secondary | ICD-10-CM

## 2021-07-11 DIAGNOSIS — Z720 Tobacco use: Secondary | ICD-10-CM

## 2021-07-11 DIAGNOSIS — I2510 Atherosclerotic heart disease of native coronary artery without angina pectoris: Secondary | ICD-10-CM

## 2021-07-11 DIAGNOSIS — E119 Type 2 diabetes mellitus without complications: Secondary | ICD-10-CM

## 2021-07-11 MED ORDER — ATORVASTATIN CALCIUM 10 MG PO TABS: 10.0000 mg | ORAL_TABLET | Freq: Every day | ORAL | 11 refills | Status: DC

## 2021-07-11 MED ORDER — ASPIRIN EC 81 MG PO TBEC
81.0000 mg | DELAYED_RELEASE_TABLET | Freq: Every day | ORAL | 6 refills | Status: AC
Start: 2021-07-11 — End: 2022-02-06

## 2021-07-11 NOTE — Chronic Care Management (AMB) (Signed)
    Chronic Care Management Pharmacy Assistant   Name: [name]  MRN: [id number] DOB: 03/30/1956  Reason for Encounter: Chart review for CPP visit on 07-11-2021   Conditions to be addressed/monitored: HTN and DMII   Recent office visits:  06-07-2021 Moore, Janece, FNP. Tdap and shingrix ordered. Referral placed to Gastroenterology and physical therapy. Glucose= 106, creatinine= 1.07, eGFR= 58. A1C= 6.0.  05-04-2021 Little, Angel L, RN (CCM)  03-21-2021 Humble, Kendra (CCM)  03-16-2021 Humble, Kendra (CCM)  03-06-2021 Moore, Janece, FNP. STOP wellbutrin. Tdap, shingrix and prevnar ordered. START hydrocortisone 1% cream apply twice daily. Referral placed to dermatology. A1C= 5.9.  01-26-2021 Little, Angel L, RN (CCM)  Recent consult visits:  None  Hospital visits:  None in previous 6 months  Medications: Outpatient Encounter Medications as of 07/11/2021  Medication Sig   amLODipine (NORVASC) 5 MG tablet TAKE 1 TABLET (5 MG TOTAL) BY MOUTH DAILY.   aspirin EC 325 MG EC tablet Take 1 tablet (325 mg total) by mouth daily. Take x 1 month post op to decrease risk of blood clots. (Patient taking differently: Take 325 mg by mouth daily.)   atorvastatin (LIPITOR) 10 MG tablet Take 1 tablet (10 mg total) by mouth daily.   escitalopram (LEXAPRO) 10 MG tablet TAKE 1 TABLET (10 MG TOTAL) BY MOUTH DAILY.   ferrous sulfate 325 (65 FE) MG tablet Take 1 tablet (325 mg total) by mouth 2 (two) times daily with a meal.   glucose blood (ACCU-CHEK AVIVA PLUS) test strip Use as instructed   hydrocortisone cream 1 % Apply to affected area 2 times daily   Insulin Degludec-Liraglutide (XULTOPHY) 100-3.6 UNIT-MG/ML SOPN Inject 15 Units into the skin daily.   Insulin Pen Needle (BD PEN NEEDLE NANO U/F) 32G X 4 MM MISC USE FOR 1 INJECTION DAILY   Lancets (ONETOUCH DELICA PLUS LANCET33G) MISC USE TO CHECK BLOOD SUGAR ONCE A DAY AS INSTRUCTED   valsartan (DIOVAN) 160 MG tablet TAKE 1 TABLET (160  MG TOTAL) BY MOUTH DAILY.   Vitamin D, Ergocalciferol, (DRISDOL) 1.25 MG (50000 UNIT) CAPS capsule TAKE 1 CAPSULE (50,000 UNITS TOTAL) BY MOUTH EVERY 7 (SEVEN) DAYS.   No facility-administered encounter medications on file as of 07/11/2021.   Have you seen any other providers since your last visit?  Any changes in your medications or health?  Any side effects from any medications?  Do you have an symptoms or problems not managed by your medications?  Any concerns about your health right now?  Has your provider asked that you check blood pressure, blood sugar, or follow special diet at home?  Do you get any type of exercise on a regular basis?  Can you think of a goal you would like to reach for your health?  Do you have any problems getting your medications?  Is there anything that you would like to discuss during the appointment?   Please bring medications and supplements to appointment  Care Gaps: PNA Vac overdue Tdap overdue Shingrix overdue Colonoscopy overdue Covid booster overdue last completed 07-30-2020 Yearly ophthalmology exam overdue last completed 02-10-2020 Flu vaccine overdue last completed 06-11-2020  Star Rating Drugs: Valsartan 160 mg- Last filled 05-08-2021 90 DS [facility] Atorvastatin 10 mg- Last filled 05-01-2021 30 DS [facility] Xultophy 100-3.6 mg- Last filled 05-01-2021 90 DS [facility]  Malecca Hicks CMA Clinical Pharmacist Assistant [phone]  

## 2021-07-11 NOTE — Progress Notes (Signed)
Chronic Care Management Pharmacy Note  07/11/2021 Name:  [name] MRN:  [id number] DOB:  August 02, 1956  Summary: Patient repots that she is ready to quit smoking   Recommendations/Changes made from today's visit: Recommend patient call the [place] Quitline. Recommend patient start taking Atorvastatin 20 mg tablet once per day.   Plan: Patient is going to call [place] Quitline. Send in prescription for Atorvastatin 20 mg tablet once  per day to [facility]. Recommend reducing patients ASA 325 mg tablet once per day to 81 mg tablet.    Subjective: [name] is an 65 y.o. year old female who is a primary patient of [name], [place].  The CCM team was consulted for assistance with disease management and care coordination needs.    Engaged with patient by telephone for follow up visit in response to provider referral for pharmacy case management and/or care coordination services. She has a big reunion with her class for over 40 years. She reports using a walk about and it helps her walk around. She has a fear of falling again. She has broken both of her shoulders and her legs, and her right arm and her back hurts. She needs something to keep her stable. She also sometimes uses her cane to walk around. She is very independent and you do most things by herself. She is not comfortable riding in the handicap [employer]ianne Lei. She does like the [employer] transportation because she gets to ride in a regular car. She reports that her birthday was great. She had a surprise cook out hosted by her family. She has 6 great great nieces and nephews and she really enjoys them. 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 She now attends church from home. [facility] delivers her medication when she needs IT.  She has lost 18 pounds and she  is very proud.   Consent to Services:  The patient was given information about Chronic Care Management services, agreed to services, and gave verbal consent prior to initiation of services.  Please see initial visit note for detailed documentation.   Patient Care Team: [name], [place] as PCP - General (General Practice) Rex Kras, Claudette Stapler, RN as Case Manager Daneen Schick as Social Worker  Recent office visits:  06-07-2021 [name], [place]. Tdap and shingrix ordered. Referral placed to Gastroenterology and physical therapy. Glucose= 106, creatinine= 1.07, eGFR= 58. A1C= 6.0.   05-04-2021 Little, Claudette Stapler, RN (CCM)   03-21-2021 Daneen Schick (CCM)   03-16-2021 Daneen Schick (CCM)   03-06-2021 [name], [place]. STOP wellbutrin. Tdap, shingrix and prevnar ordered. START hydrocortisone 1% cream apply twice daily. Referral placed to dermatology. A1C= 5.9.   01-26-2021 Little, Claudette Stapler, RN (CCM)   Recent consult visits:  None   Hospital visits:  None in previous 6 months   Objective:  Lab Results  Component Value Date   CREATININE 1.07 (H) 06/07/2021   BUN 22 06/07/2021   GFRNONAA 56 (L) 08/11/2020   GFRAA 64 08/11/2020   NA 144 06/07/2021   K 4.2 06/07/2021   CALCIUM 9.4 06/07/2021   CO2 23 06/07/2021   GLUCOSE 106 (H) 06/07/2021    Lab Results  Component Value Date/Time   HGBA1C 6.0 (H) 06/07/2021 03:55 PM   HGBA1C 5.9 (H) 03/06/2021  04:36 PM   MICROALBUR 150 01/12/2020 05:28 PM   MICROALBUR 150 07/28/2019 12:58 PM    Last diabetic Eye exam:  Lab Results  Component Value Date/Time   HMDIABEYEEXA No Retinopathy 02/10/2020 12:00 AM    Last diabetic Foot exam: No results found for: HMDIABFOOTEX   Lab Results  Component Value Date   CHOL 113 03/06/2021   HDL 40 03/06/2021   LDLCALC 55 03/06/2021   TRIG 94 03/06/2021   CHOLHDL 2.8 03/06/2021    Hepatic Function Latest Ref Rng & Units 03/06/2021 11/16/2020 08/11/2020  Total Protein 6.0 - 8.5 g/dL 6.7 6.9  6.7  Albumin 3.8 - 4.8 g/dL 4.2 4.2 4.0  AST 0 - 40 IU/L $Remov'10 15 12  'IvTNLb$ ALT 0 - 32 IU/L $Remov'12 10 14  'VamlSm$ Alk Phosphatase 44 - 121 IU/L 112 100 90  Total Bilirubin 0.0 - 1.2 mg/dL <0.2 0.2 <0.2  Bilirubin, Direct - - - -    Lab Results  Component Value Date/Time   TSH 0.855 04/13/2020 02:51 PM   TSH 0.451 01/07/2020 11:00 AM   TSH 0.83 04/03/2018 12:00 AM    CBC Latest Ref Rng & Units 11/16/2020 08/11/2020 04/13/2020  WBC 3.4 - 10.8 x10E3/uL 12.6(H) 13.3(H) 11.9(H)  Hemoglobin 11.1 - 15.9 g/dL 12.1 11.6 11.9  Hematocrit 34.0 - 46.6 % 38.7 38.7 40.0  Platelets 150 - 450 x10E3/uL 346 327 266    Lab Results  Component Value Date/Time   VD25OH 26.8 (L) 08/11/2020 11:09 AM   VD25OH 44.2 11/18/2019 02:44 PM    Clinical ASCVD: Yes  The ASCVD Risk score (Arnett DK, et al., 2019) failed to calculate for the following reasons:   The valid total cholesterol range is 130 to 320 mg/dL    Depression screen [facility] 2/9 07/11/2021 08/11/2020 04/13/2020  Decreased Interest 2 0 3  Down, Depressed, Hopeless 0 0 1  PHQ - 2 Score 2 0 4  Altered sleeping $RemoveBeforeDE'1 1 3  'VbdRgfZvytMAalO$ Tired, decreased energy 0 1 1  Change in appetite 0 0 0  Feeling bad or failure about yourself  0 0 0  Trouble concentrating 0 0 0  Moving slowly or fidgety/restless 0 0 0  Suicidal thoughts 0 0 0  PHQ-9 Score $RemoveBef'3 2 8  'khWEhkcnYl$ Difficult doing work/chores - Not difficult at all Somewhat difficult      Social History   Tobacco Use  Smoking Status Every Day   Packs/day: 1.00   Years: 30.00   Pack years: 30.00   Types: Cigarettes  Smokeless Tobacco Never  Tobacco Comments   started back in 2012   BP Readings from Last 3 Encounters:  06/07/21 132/78  03/06/21 122/78  12/21/20 118/62   Pulse Readings from Last 3 Encounters:  06/07/21 78  03/06/21 (!) 56  12/21/20 63   Wt Readings from Last 3 Encounters:  06/07/21 183 lb (83 kg)  03/06/21 186 lb 12.8 oz (84.7 kg)  12/21/20 189 lb (85.7 kg)   BMI Readings from Last 3 Encounters:  06/07/21 35.04  kg/m  03/06/21 35.76 kg/m  12/21/20 36.18 kg/m    Assessment/Interventions: Review of patient past medical history, allergies, medications, health status, including review of consultants reports, laboratory and other test data, was performed as part of comprehensive evaluation and provision of chronic care management services.   SDOH:  (Social Determinants of Health) assessments and interventions performed: Yes SDOH Interventions    Flowsheet Row Most Recent Value  SDOH Interventions   Depression Interventions/Treatment  PHQ2-9 Score <4 Follow-up  Not Indicated, Currently on Treatment      SDOH Screenings   Alcohol Screen: Not on file  Depression (PHQ2-9): Low Risk    PHQ-2 Score: 3  Financial Resource Strain: Not on file  Food Insecurity: Not on file  Housing: Not on file  Physical Activity: Not on file  Social Connections: Not on file  Stress: Not on file  Tobacco Use: High Risk   Smoking Tobacco Use: Every Day   Smokeless Tobacco Use: Never   Passive Exposure: Not on file  Transportation Needs: Not on file    [place]  No Known Allergies  Medications Reviewed Today     Reviewed by Mayford Knife, [place] (Pharmacist) on 07/11/21 at 0931  Med List Status: <None>   Medication Order Taking? Sig Documenting Provider Last Dose Status Informant  amLODipine (NORVASC) 5 MG tablet [id number] Yes TAKE 1 TABLET (5 MG TOTAL) BY MOUTH DAILY. [name], FNP  Active   aspirin EC 325 MG EC tablet [id number] Yes Take 1 tablet (325 mg total) by mouth daily. Take x 1 month post op to decrease risk of blood clots.  Patient taking differently: Take 325 mg by mouth daily.   Gary Fleet, PA-C  Active   atorvastatin (LIPITOR) 10 MG tablet [id number] Yes Take 1 tablet (10 mg total) by mouth daily. [name], FNP  Active   dorzolamide-timolol (COSOPT) 22.3-6.8 MG/ML ophthalmic solution [id number] Yes Place 1 drop into both eyes 2 (two) times daily. [provider]   Active   escitalopram (LEXAPRO) 10 MG tablet [id number] Yes TAKE 1 TABLET (10 MG TOTAL) BY MOUTH DAILY. [name], FNP  Active   ferrous sulfate 325 (65 FE) MG tablet [id number] Yes Take 1 tablet (325 mg total) by mouth 2 (two) times daily with a meal. Darliss Cheney, MD  Active   glucose blood (ACCU-CHEK AVIVA PLUS) test strip [id number] Yes Use as instructed [name], FNP  Active   hydrocortisone cream 1 % [id number] Yes Apply to affected area 2 times daily [name], FNP  Active   Insulin Degludec-Liraglutide (XULTOPHY) 100-3.6 UNIT-MG/ML SOPN [id number] Yes Inject 15 Units into the skin daily. [name], FNP  Active   Insulin Pen Needle (BD PEN NEEDLE NANO U/F) 32G X 4 MM MISC [id number] Yes USE FOR 1 INJECTION DAILY Glendale Chard, MD  Active Self  Lancets (ONETOUCH DELICA PLUS [id number]) [place] [id number] Yes USE TO CHECK BLOOD SUGAR ONCE A DAY AS INSTRUCTED [name], FNP  Active   valsartan (DIOVAN) 160 MG tablet [id number] Yes TAKE 1 TABLET (160 MG TOTAL) BY MOUTH DAILY. [name], FNP  Active   Vitamin D, Ergocalciferol, (DRISDOL) 1.25 MG (50000 UNIT) CAPS capsule [id number] Yes TAKE 1 CAPSULE (50,000 UNITS TOTAL) BY MOUTH EVERY 7 (SEVEN) DAYS. Bary Castilla, NP  Active             Patient Active Problem List   Diagnosis Date Noted   BMI 36.0-36.9,adult 01/07/2021   CMV (cytomegalovirus) antibody positive 03/02/2020   Iron deficiency anemia 01/08/2020   Folate deficiency anemia 01/07/2020   Chronic back pain 01/07/2020   Tobacco dependence 01/07/2020   Hypertensive retinopathy of both eyes 02/18/2019   Combined form of age-related cataract, both eyes 02/18/2019   Closed displaced oblique fracture of shaft of left humerus 12/17/2018   Insomnia 06/08/2018   Essential hypertension 06/08/2018   Olecranon bursitis, left elbow 06/19/2017   Closed fracture of distal end of right fibula and tibia 06/10/2016   Diabetes (  [place]) 06/10/2016   Traumatic closed displaced  fracture of shaft of right tibia and fibula 06/10/2016    Immunization History  Administered Date(s) Administered   Influenza,inj,Quad PF,6+ Mos 07/28/2019, 08/11/2020   PFIZER(Purple Top)SARS-COV-2 Vaccination 11/16/2019, 12/11/2019, 07/30/2020    Conditions to be addressed/monitored:  Hypertension, Hyperlipidemia, and Diabetes  Care Plan : [place]  Updates made by Mayford Knife, [place] since 07/11/2021 12:00 AM     Problem: HTN, DEPRESSION, DIABETES, TOBACCO USE, ATHEROSCLEROTIC CARDIOVASCULAR DISEASE   Priority: High     Goal: Disease Management   This Visit's Progress: On track  Note:       Current Barriers:  Unable to independently monitor therapeutic efficacy Unable to achieve control of smoking cessation   Pharmacist Clinical Goal(s):  Patient will verbalize ability to afford treatment regimen achieve adherence to monitoring guidelines and medication adherence to achieve therapeutic efficacy through collaboration with PharmD and provider.   Interventions: 1:1 collaboration with [name], FNP regarding development and update of comprehensive plan of care as evidenced by provider attestation and co-signature Inter-disciplinary care team collaboration (see longitudinal plan of care) Comprehensive medication review performed; medication list updated in electronic medical record  Hypertension (BP goal <130/80) -Not ideally controlled -Current treatment: Amlodipine 5 mg tablet once per day Valsartan 160 mg tablet once per day  -Current home readings: patient is not currently checking her BP at home -Current dietary habits: she is doing a lot better, she reports  -Current exercise habits: she does chair exercises or she sits in her bed and does arm lifts with things that are found in her home.  -Recommended that patient is going to use OTC book to order a BP cuff.  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   Diabetes (A1c goal <7%) -Controlled -Current medications: Xultophy 100-3.6 Unit-MG/ML- inject 15 units daily -Medications previously tried: [place] 200 unit/ml   -Current home glucose readings fasting glucose: 108, 142 -Denies hypoglycemic/hyperglycemic symptoms -Current meal patterns: will discuss with patient during next visit in December  drinks: goal to increase her water intake.  -Educated on A1c and blood sugar goals; Benefits of routine self-monitoring of blood sugar; -Counseled to check feet daily and get yearly eye exams -Counseled on diet and exercise extensively  Depression (Goal: reduce signs and symptoms of depression) -Controlled -Current treatment: Escitalopram 10 mg tablet once per day  -Medications previously tried/failed: none noted  -PHQ9: 3 -Connected with therapist for  mental health support but the cost  was too high.  -She reports that she was seeing a therapist covered through her insurance but then the cost became too high. -Will collaborate with the social worker to see if patient qualifies for counseling services.  -She is going to check into the lunch buddies program  -Educated on Benefits of medication for symptom control Benefits of cognitive-behavioral therapy with or without medication -Recommended to continue current medication  Tobacco use (Goal: reduce and stop smoking ) -Uncontrolled -Previous quit attempts: 1997, 1 - started back in 2012 -Current treatment  Not currently taking any medication  -Used Previously : Chantix  -Patient smokes After 30 minutes of waking -Patient triggers include: stress, anxiety, boredom , and sadness and finishing a meal and drinking coffee -On a scale of 1-10, reports MOTIVATION to quit is 8 -On a scale of 1-10, reports CONFIDENCE in quitting is 9 -Fragerstrom score:  -Patient set quit date of 08/2021 - is a goal. She does not want to go into the New Year smoking. Provided contact information for [place] [facility]uit Line (1-800-QUIT-NOW) and encouraged patient to reach out to this group for support. -Collaborated with patient to determine smoking cessation plan    Health Maintenance -Vaccine gaps: Pneumonia Vaccine, TDAP, Shingrix Vaccine, COVID-19 Booster, Influenza Vaccine  -Counseled on the importance of COVID-19 booster.   -Scheduled patient for COVID-19 booster to be received on 07/19/2021 at [facility]   Patient Goals/Self-Care Activities Patient will:  - take medications as prescribed as evidenced by patient report and record review  Follow Up Plan: The patient has been provided with contact information for the care management team and has been advised to call with any health related questions or concerns.        Medication Assistance: None required.  Patient affirms current  coverage meets needs.  Compliance/Adherence/Medication fill history: Care Gaps: Pneumonia Vaccine TDAP [facility] Vaccine COLONOSCOPY  COVID-19 Vaccine Ophthalmology Exam  Influenza Vaccine DEXA Scan - ordered 06/07/2021  Star-[facility] Drugs: Atorvastatin 10 mg tablet Xultophy 100-3.6 unit-mg/ML Valsartan 160 mg tablet   Patient's preferred pharmacy is:  [facility], [place] [address] Phone: [phone] Fax: [phone]  [id number] - [place], [place] [address] [place] [address] Phone: [phone] Fax: [place], [place] - [place] [address] [place] [facility] [place] [address] Phone: [phone] Fax: [phone]  [facility] ([place]), [place] - [address] [place] [address] Phone: [phone] Fax: [phone]  Uses pill box? Yes Pt endorses 85% compliance  We discussed: Benefits of medication synchronization, packaging and delivery as well as enhanced pharmacist oversight with [employer]. Patient decided to: Continue current medication management strategy  Care Plan and Follow Up Patient Decision:  Patient agrees to Care Plan and Follow-up.  Plan: The patient has been provided with contact information for the care management team and has been advised to call with any health related questions or concerns.   Orlando Penner, PharmD Clinical Pharmacist [facility] [phone]

## 2021-07-11 NOTE — Patient Instructions (Signed)
Visit Information It was great speaking with you today!  Please let me know if you have any questions about our visit.   Goals Addressed             This Visit's Progress    Manage My Medicine       Timeframe:  Long-Range Goal Priority:  High Start Date:                             Expected End Date:                       Follow Up Date 08/22/2021    - call for medicine refill 2 or 3 days before it runs out - call if I am sick and can't take my medicine - keep a list of all the medicines I take; vitamins and herbals too - use a pillbox to sort medicine - use an alarm clock or phone to remind me to take my medicine    Why is this important?   These steps will help you keep on track with your medicines.   Notes:  Please call if you have any questions or concerns about your medications.         Patient Care Plan: CCM Pharmacy Care Plan     Problem Identified: HTN, DEPRESSION, DIABETES, TOBACCO USE, ATHEROSCLEROTIC CARDIOVASCULAR DISEASE   Priority: High     Goal: Disease Management   This Visit's Progress: On track  Note:       Current Barriers:  Unable to independently monitor therapeutic efficacy Unable to achieve control of smoking cessation   Pharmacist Clinical Goal(s):  Patient will verbalize ability to afford treatment regimen achieve adherence to monitoring guidelines and medication adherence to achieve therapeutic efficacy through collaboration with PharmD and provider.   Interventions: 1:1 collaboration with Arnette Felts, FNP regarding development and update of comprehensive plan of care as evidenced by provider attestation and co-signature Inter-disciplinary care team collaboration (see longitudinal plan of care) Comprehensive medication review performed; medication list updated in electronic medical record  Hypertension (BP goal <130/80) -Not ideally controlled -Current treatment: Amlodipine 5 mg tablet once per day Valsartan 160 mg tablet  once per day  -Current home readings: patient is not currently checking her BP at home -Current dietary habits: she is doing a lot better, she reports  -Current exercise habits: she does chair exercises or she sits in her bed and does arm lifts with things that are found in her home.  -Recommended that patient is going to use OTC book to order a BP cuff.  -Denies hypotensive/hypertensive symptoms -Educated on BP goals and benefits of medications for prevention of heart attack, stroke and kidney damage; Importance of home blood pressure monitoring; Proper BP monitoring technique; -Counseled to monitor BP at home at least every other day, document, and provide log at future appointments -Recommended to continue current medication  Atherosclerotic Cardiovascular Disease: (LDL goal < 70) -Controlled -Current treatment: Aspirin 325 mg tablet once per day      Atorvastatin 10 mg tablet once per day  Patient reports that she has not been filling this medication because it as Psychologist, forensic -Current dietary patterns: patient reports that she is limiting the amount of salt she eats  -Current exercise habits: please see hypertension -Will send patients prescription of Atorvastatin 81 mg tablet to [facility] who will be able deliver her medication  -Educated on Cholesterol  goals;  Benefits of statin for ASCVD risk reduction; Importance of limiting foods high in cholesterol; -Recommended to continue current medication Recommended patients Aspirin to be changed to 81 mg tablet once per day, because the benefit is the same, patients 325 mg dose.  -Send patients atorvastatin to [facility]   Diabetes (A1c goal <7%) -Controlled -Current medications: Xultophy 100-3.6 Unit-MG/ML- inject 15 units daily -Medications previously tried: [place] 200 unit/ml   -Current home glucose readings fasting glucose: 108, 142 -Denies hypoglycemic/hyperglycemic symptoms -Current meal patterns: will  discuss with patient during next visit in December  drinks: goal to increase her water intake.  -Educated on A1c and blood sugar goals; Benefits of routine self-monitoring of blood sugar; -Counseled to check feet daily and get yearly eye exams -Counseled on diet and exercise extensively  Depression (Goal: reduce signs and symptoms of depression) -Controlled -Current treatment: Escitalopram 10 mg tablet once per day  -Medications previously tried/failed: none noted  -PHQ9: 3 -Connected with therapist for mental health support but the cost was too high.  -She reports that she was seeing a therapist covered through her insurance but then the cost became too high. -Will collaborate with the social worker to see if patient qualifies for counseling services.  -She is going to check into the lunch buddies program  -Educated on Benefits of medication for symptom control Benefits of cognitive-behavioral therapy with or without medication -Recommended to continue current medication  Tobacco use (Goal: reduce and stop smoking ) -Uncontrolled -Previous quit attempts: 1997, 1 - started back in 2012 -Current treatment  Not currently taking any medication  -Used Previously : Chantix  -Patient smokes After 30 minutes of waking -Patient triggers include: stress, anxiety, boredom , and sadness and finishing a meal and drinking coffee -On a scale of 1-10, reports MOTIVATION to quit is 8 -On a scale of 1-10, reports CONFIDENCE in quitting is 9 -Fragerstrom score:  -Patient set quit date of 08/2021 - is a goal. She does not want to go into the New Year smoking. Provided contact information for [place] [facility] (1-800-QUIT-NOW) and encouraged patient to reach out to this group for support. -Collaborated with patient to determine smoking cessation plan    Health Maintenance -Vaccine gaps: Pneumonia Vaccine, TDAP, Shingrix Vaccine, COVID-19 Booster, Influenza Vaccine  -Counseled on the importance of  COVID-19 booster.   -Scheduled patient for COVID-19 booster to be received on 07/19/2021 at [facility] on [facility]   Patient Goals/Self-Care Activities Patient will:  - take medications as prescribed as evidenced by patient report and record review  Follow Up Plan: The patient has been provided with contact information for the care management team and has been advised to call with any health related questions or concerns.         Ms. Mcferran was given information about Chronic Care Management services today including:  CCM service includes personalized support from designated clinical staff supervised by her physician, including individualized plan of care and coordination with other care providers 24/7 contact phone numbers for assistance for urgent and routine care needs. Standard insurance, coinsurance, copays and deductibles apply for chronic care management only during months in which we provide at least 20 minutes of these services. Most insurances cover these services at 100%, however patients may be responsible for any copay, coinsurance and/or deductible if applicable. This service may help you avoid the need for more expensive face-to-face services. Only one practitioner may furnish and bill the service in a calendar month. The patient  may stop CCM services at any time (effective at the end of the month) by phone call to the office staff.  Patient agreed to services and verbal consent obtained.   The patient verbalized understanding of instructions, educational materials, and care plan provided today and agreed to receive a mailed copy of patient instructions, educational materials, and care plan.   Cherylin Mylar, PharmD Clinical Pharmacist [facility] [phone]

## 2021-08-07 ENCOUNTER — Other Ambulatory Visit: Payer: Self-pay

## 2021-08-07 ENCOUNTER — Encounter (INDEPENDENT_AMBULATORY_CARE_PROVIDER_SITE_OTHER): Payer: Medicare PPO | Admitting: Ophthalmology

## 2021-08-07 DIAGNOSIS — H35033 Hypertensive retinopathy, bilateral: Secondary | ICD-10-CM | POA: Diagnosis not present

## 2021-08-07 DIAGNOSIS — I10 Essential (primary) hypertension: Secondary | ICD-10-CM | POA: Diagnosis not present

## 2021-08-07 DIAGNOSIS — H34812 Central retinal vein occlusion, left eye, with macular edema: Secondary | ICD-10-CM

## 2021-08-07 DIAGNOSIS — H43813 Vitreous degeneration, bilateral: Secondary | ICD-10-CM | POA: Diagnosis not present

## 2021-08-07 DIAGNOSIS — E113311 Type 2 diabetes mellitus with moderate nonproliferative diabetic retinopathy with macular edema, right eye: Secondary | ICD-10-CM | POA: Diagnosis not present

## 2021-08-07 DIAGNOSIS — E113512 Type 2 diabetes mellitus with proliferative diabetic retinopathy with macular edema, left eye: Secondary | ICD-10-CM

## 2021-08-09 DIAGNOSIS — I2510 Atherosclerotic heart disease of native coronary artery without angina pectoris: Secondary | ICD-10-CM | POA: Diagnosis not present

## 2021-08-09 DIAGNOSIS — F32A Depression, unspecified: Secondary | ICD-10-CM

## 2021-08-09 DIAGNOSIS — I10 Essential (primary) hypertension: Secondary | ICD-10-CM | POA: Diagnosis not present

## 2021-08-09 DIAGNOSIS — Z794 Long term (current) use of insulin: Secondary | ICD-10-CM

## 2021-08-09 DIAGNOSIS — E1159 Type 2 diabetes mellitus with other circulatory complications: Secondary | ICD-10-CM

## 2021-08-15 ENCOUNTER — Ambulatory Visit: Payer: Self-pay | Admitting: Nurse Practitioner

## 2021-08-17 ENCOUNTER — Telehealth: Payer: Medicare PPO

## 2021-08-17 ENCOUNTER — Ambulatory Visit: Payer: Self-pay

## 2021-08-17 DIAGNOSIS — I10 Essential (primary) hypertension: Secondary | ICD-10-CM

## 2021-08-17 DIAGNOSIS — F32A Depression, unspecified: Secondary | ICD-10-CM

## 2021-08-17 DIAGNOSIS — E559 Vitamin D deficiency, unspecified: Secondary | ICD-10-CM

## 2021-08-17 DIAGNOSIS — E119 Type 2 diabetes mellitus without complications: Secondary | ICD-10-CM

## 2021-08-17 NOTE — Progress Notes (Signed)
This encounter was created in error - please disregard.

## 2021-08-18 NOTE — Patient Instructions (Addendum)
Visit Information   Thank you for taking time to visit with me today. Please don't hesitate to contact me if I can be of assistance to you before our next scheduled telephone appointment.  Following are the goals we discussed today:  (Copy and paste patient goals from clinical care plan here)  Our next appointment is by telephone on 10/18/21 at 11:20 AM  Please call the care guide team at [phone] if you need to cancel or reschedule your appointment.   If you are experiencing a Mental Health or [place] or need someone to talk to, please call 1-800-273-TALK (toll free, 24 hour hotline)   Following is a copy of your full care plan:  Care Plan : [place] of Care  Updates made by Lynne Logan, RN since 08/17/2021 12:00 AM     Problem: No plan of care established for management of chronic disease states (Type 2 Diabetes Mellitus, Essential hypertension, Vitamin D deficiency, Depression)   Priority: High     Long-Range Goal: Development of plan of care for chronic disease management (Type 2 Diabetes Mellitus, Essential hypertension, Vitamin D deficiency, Depression)   Start Date: 08/17/2021  Expected End Date: 08/17/2022  This Visit's Progress: On track  Priority: High  Note:   Current Barriers:  Knowledge Deficits related to plan of care for management of Type 2 Diabetes Mellitus, Essential hypertension, Vitamin D deficiency, Depression   Chronic Disease Management support and education needs related to Type 2 Diabetes Mellitus, Essential hypertension, Vitamin D deficiency, Depression   Civil engineer, contracting Barriers   RNCM Clinical Goal(s):  Patient will verbalize basic understanding of  Type 2 Diabetes Mellitus, Essential hypertension, Vitamin D deficiency, Depression  disease process and self health management plan as evidenced by patient will report having no disease exacerbations related to her chronic disease states take all  medications exactly as prescribed and will call provider for medication related questions as evidenced by patient will report having no missed doses of her prescribed medications  demonstrate Ongoing health management independence as evidenced by patient will report 100% adherence to her prescribed treatment plan  continue to work with RN Care Manager to address care management and care coordination needs related to  Type 2 Diabetes Mellitus, Essential hypertension, Vitamin D deficiency, Depression  as evidenced by adherence to CM Team Scheduled appointments demonstrate ongoing self health care management ability   as evidenced by    through collaboration with RN Care manager, provider, and care team.   Interventions: 1:1 collaboration with primary care provider regarding development and update of comprehensive plan of care as evidenced by provider attestation and co-signature Inter-disciplinary care team collaboration (see longitudinal plan of care) Evaluation of current treatment plan related to  self management and patient's adherence to plan as established by provider   Diabetes Interventions:  (Status:  Goal on track:  Yes.) Long Term Goal Assessed patient's understanding of A1c goal: <6.5% Provided education to patient about basic DM disease process Reviewed medications with patient and discussed importance of medication adherence Counseled on importance of regular laboratory monitoring as prescribed Advised patient, providing education and rationale, to check cbg daily before meals and record, calling PCP and or RN CM for findings outside established parameters Review of patient status, including review of consultants reports, relevant laboratory and other test results, and medications completed Assessed social determinant of health barriers Discussed plans with patient for ongoing care management follow up and provided patient with direct contact information for  care management team Lab  Results  Component Value Date   HGBA1C 6.0 (H) 06/07/2021   Depression:  (Status:  Goal on track:  Yes.)  Long Term Goal Evaluation of current treatment plan related to  Depression , self-management and patient's adherence to plan as established by provider Reviewed medications with patient and discussed importance of medication adherence Determined patient continues to have mild depression, she does not feel Lexapro is effective in relieving her symptoms Instructed patient to discuss her symptoms with PCP at next visit; educated patient on the importance of not abruptly stopping antidepressant but to work with PCP to help titrate off medication or consider an alternate therapy  Determined patient uses distraction, church and prayer to help her cope and finds this to be somewhat effective, she continues to experience lack of interest in doing the things she enjoys doing, she denies SI  Encouraged patient to continue to stay socially engaged with friends/family to help with depression Educated patient while providing rationale on the benefits of exercise to help with mental health as well as overall physical health  Determined patient is interested in non-medical transportation and applying for Medicaid, SW referral sent via in basket message  Discussed plans with patient for ongoing care management follow up and provided patient with direct contact information for care management team  Hypertension Interventions:  (Status:  Goal on track:  Yes.) Long Term Goal Last practice recorded BP readings:  BP Readings from Last 3 Encounters:  06/07/21 132/78  03/06/21 122/78  12/21/20 118/62  Most recent eGFR/CrCl:  Lab Results  Component Value Date   EGFR 58 (L) 06/07/2021    No components found for: CRCL  Evaluation of current treatment plan related to hypertension self management and patient's adherence to plan as established by provider Provided education to patient re: stroke prevention, s/s  of heart attack and stroke Reviewed medications with patient and discussed importance of compliance Counseled on the importance of exercise goals with target of 150 minutes per week Provided education on prescribed diet low Sodium Discussed complications of poorly controlled blood pressure such as heart disease, stroke, circulatory complications, vision complications, kidney impairment, sexual dysfunction Discussed plans with patient for ongoing care management follow up and provided patient with direct contact information for care management team  Patient Goals/Self-Care Activities: Take all medications as prescribed Attend all scheduled provider appointments Call pharmacy for medication refills 3-7 days in advance of running out of medications Perform all self care activities independently  Call provider office for new concerns or questions  Work with the social worker to address care coordination needs and will continue to work with the clinical team to address health care and disease management related needs drink 6 to 8 glasses of water each day fill half of plate with vegetables manage portion size take medications for blood pressure exactly as prescribed  Follow Up Plan:  Telephone follow up appointment with care management team member scheduled for:  10/18/21      Consent to CCM Services: [name] was given information about Chronic Care Management services including:  CCM service includes personalized support from designated clinical staff supervised by her physician, including individualized plan of care and coordination with other care providers 24/7 contact phone numbers for assistance for urgent and routine care needs. Service will only be billed when office clinical staff spend 20 minutes or more in a month to coordinate care. Only one practitioner may furnish and bill the service in a calendar month. The  patient may stop CCM services at any time (effective at the end  of the month) by phone call to the office staff. The patient will be responsible for cost sharing (co-pay) of up to 20% of the service fee (after annual deductible is met).  Patient agreed to services and verbal consent obtained.   Patient verbalizes understanding of instructions provided today and agrees to view in [place].   Telephone follow up appointment with care management team member scheduled for: 10/18/21

## 2021-08-18 NOTE — Chronic Care Management (AMB) (Addendum)
Chronic Care Management   CCM RN Visit Note  08/17/2021 Name: [name] MRN: [id number] DOB: 1956-03-29  Subjective: [name] is a 65 y.o. year old female who is a primary care patient of [name], [place]. The care management team was consulted for assistance with disease management and care coordination needs.    Engaged with patient by telephone for follow up visit in response to provider referral for case management and/or care coordination services.   Consent to Services:  The patient was given information about Chronic Care Management services, agreed to services, and gave verbal consent prior to initiation of services.  Please see initial visit note for detailed documentation.   Patient agreed to services and verbal consent obtained.   Assessment: Review of patient past medical history, allergies, medications, health status, including review of consultants reports, laboratory and other test data, was performed as part of comprehensive evaluation and provision of chronic care management services.   SDOH (Social Determinants of Health) assessments and interventions performed:  Yes, see care plan   CCM Care Plan  No Known Allergies  Outpatient Encounter Medications as of 08/17/2021  Medication Sig   amLODipine (NORVASC) 5 MG tablet TAKE 1 TABLET (5 MG TOTAL) BY MOUTH DAILY.   aspirin 81 MG tablet Take 1 tablet (81 mg total) by mouth daily. Take x 1 month post op to decrease risk of blood clots.   atorvastatin (LIPITOR) 10 MG tablet Take 1 tablet (10 mg total) by mouth daily.   dorzolamide-timolol (COSOPT) 22.3-6.8 MG/ML ophthalmic solution Place 1 drop into both eyes 2 (two) times daily.   escitalopram (LEXAPRO) 10 MG tablet TAKE 1 TABLET (10 MG TOTAL) BY MOUTH DAILY.   hydrocortisone cream 1 % Apply to affected area 2 times daily   Insulin Degludec-Liraglutide (XULTOPHY) 100-3.6 UNIT-MG/ML SOPN Inject 15 Units into the skin daily.   valsartan (DIOVAN) 160 MG  tablet TAKE 1 TABLET (160 MG TOTAL) BY MOUTH DAILY.   Vitamin D, Ergocalciferol, (DRISDOL) 1.25 MG (50000 UNIT) CAPS capsule TAKE 1 CAPSULE (50,000 UNITS TOTAL) BY MOUTH EVERY 7 (SEVEN) DAYS.   ferrous sulfate 325 (65 FE) MG tablet Take 1 tablet (325 mg total) by mouth 2 (two) times daily with a meal.   glucose blood (ACCU-CHEK AVIVA PLUS) test strip Use as instructed   Insulin Pen Needle (BD PEN NEEDLE NANO U/F) 32G X 4 MM MISC USE FOR 1 INJECTION DAILY   Lancets (ONETOUCH DELICA PLUS [id number]) MISC USE TO CHECK BLOOD SUGAR ONCE A DAY AS INSTRUCTED   No facility-administered encounter medications on file as of 08/17/2021.    Patient Active Problem List   Diagnosis Date Noted   BMI 36.0-36.9,adult 01/07/2021   CMV (cytomegalovirus) antibody positive 03/02/2020   Iron deficiency anemia 01/08/2020   Folate deficiency anemia 01/07/2020   Chronic back pain 01/07/2020   Tobacco dependence 01/07/2020   Hypertensive retinopathy of both eyes 02/18/2019   Combined form of age-related cataract, both eyes 02/18/2019   Closed displaced oblique fracture of shaft of left humerus 12/17/2018   Insomnia 06/08/2018   Essential hypertension 06/08/2018   Olecranon bursitis, left elbow 06/19/2017   Closed fracture of distal end of right fibula and tibia 06/10/2016   Diabetes ([place]) 06/10/2016   Traumatic closed displaced fracture of shaft of right tibia and fibula 06/10/2016    Conditions to be addressed/monitored: Type 2 Diabetes Mellitus, Essential hypertension, Vitamin D deficiency, Depression   Care Plan : RN Care Manager Plan of Care  Updates made by Lynne Logan, RN since 08/17/2021 12:00 AM     Problem: No plan of care established for management of chronic disease states (Type 2 Diabetes Mellitus, Essential hypertension, Vitamin D deficiency, Depression)   Priority: High     Long-Range Goal: Development of plan of care for chronic disease management (Type 2 Diabetes Mellitus, Essential  hypertension, Vitamin D deficiency, Depression)   Start Date: 08/17/2021  Expected End Date: 08/17/2022  This Visit's Progress: On track  Priority: High  Note:   Current Barriers:  Knowledge Deficits related to plan of care for management of Type 2 Diabetes Mellitus, Essential hypertension, Vitamin D deficiency, Depression   Chronic Disease Management support and education needs related to Type 2 Diabetes Mellitus, Essential hypertension, Vitamin D deficiency, Depression   Civil engineer, contracting Barriers  RNCM Clinical Goal(s):  Patient will verbalize basic understanding of  Type 2 Diabetes Mellitus, Essential hypertension, Vitamin D deficiency, Depression  disease process and self health management plan as evidenced by patient will report having no disease exacerbations related to her chronic disease states take all medications exactly as prescribed and will call provider for medication related questions as evidenced by patient will report having no missed doses of her prescribed medications  demonstrate Ongoing health management independence as evidenced by patient will report 100% adherence to her prescribed treatment plan  continue to work with RN Care Manager to address care management and care coordination needs related to  Type 2 Diabetes Mellitus, Essential hypertension, Vitamin D deficiency, Depression  as evidenced by adherence to CM Team Scheduled appointments demonstrate ongoing self health care management ability   as evidenced by    through collaboration with RN Care manager, provider, and care team.   Interventions: 1:1 collaboration with primary care provider regarding development and update of comprehensive plan of care as evidenced by provider attestation and co-signature Inter-disciplinary care team collaboration (see longitudinal plan of care) Evaluation of current treatment plan related to  self management and patient's adherence to plan as established by  provider   Diabetes Interventions:  (Status:  Goal on track:  Yes.) Long Term Goal Assessed patient's understanding of A1c goal: <6.5% Provided education to patient about basic DM disease process Reviewed medications with patient and discussed importance of medication adherence Counseled on importance of regular laboratory monitoring as prescribed Advised patient, providing education and rationale, to check cbg daily before meals and record, calling PCP and or RN CM for findings outside established parameters Review of patient status, including review of consultants reports, relevant laboratory and other test results, and medications completed Assessed social determinant of health barriers Discussed plans with patient for ongoing care management follow up and provided patient with direct contact information for care management team Lab Results  Component Value Date   HGBA1C 6.0 (H) 06/07/2021   Depression:  (Status:  Goal on track:  Yes.)  Long Term Goal Evaluation of current treatment plan related to  Depression , self-management and patient's adherence to plan as established by provider Reviewed medications with patient and discussed importance of medication adherence Determined patient continues to have mild depression, she does not feel Lexapro is effective in relieving her symptoms Instructed patient to discuss her symptoms with PCP at next visit; educated patient on the importance of not abruptly stopping antidepressant but to work with PCP to help titrate off medication or consider an alternate therapy  Determined patient uses distraction, church and prayer to help her cope and finds this to be somewhat  effective, she continues to experience lack of interest in doing the things she enjoys doing, she denies SI  Encouraged patient to continue to stay socially engaged with friends/family to help with depression Educated patient while providing rationale on the benefits of exercise to help  with mental health as well as overall physical health  Determined patient is interested in non-medical transportation and applying for Medicaid, SW referral sent via in basket message  Discussed plans with patient for ongoing care management follow up and provided patient with direct contact information for care management team  Hypertension Interventions:  (Status:  Goal on track:  Yes.) Long Term Goal Last practice recorded BP readings:  BP Readings from Last 3 Encounters:  06/07/21 132/78  03/06/21 122/78  12/21/20 118/62  Most recent eGFR/CrCl:  Lab Results  Component Value Date   EGFR 58 (L) 06/07/2021    No components found for: CRCL  Evaluation of current treatment plan related to hypertension self management and patient's adherence to plan as established by provider Provided education to patient re: stroke prevention, s/s of heart attack and stroke Reviewed medications with patient and discussed importance of compliance Counseled on the importance of exercise goals with target of 150 minutes per week Provided education on prescribed diet low Sodium Discussed complications of poorly controlled blood pressure such as heart disease, stroke, circulatory complications, vision complications, kidney impairment, sexual dysfunction Discussed plans with patient for ongoing care management follow up and provided patient with direct contact information for care management team  Patient Goals/Self-Care Activities: Take all medications as prescribed Attend all scheduled provider appointments Call pharmacy for medication refills 3-7 days in advance of running out of medications Perform all self care activities independently  Call provider office for new concerns or questions  Work with the social worker to address care coordination needs and will continue to work with the clinical team to address health care and disease management related needs drink 6 to 8 glasses of water each day fill  half of plate with vegetables manage portion size take medications for blood pressure exactly as prescribed  Follow Up Plan:  Telephone follow up appointment with care management team member scheduled for:  10/18/21     Plan:Telephone follow up appointment with care management team member scheduled for:  10/18/21  Barb Merino, RN, BSN, CCM Care Management Coordinator [place] [facility]  Direct Phone: [phone]

## 2021-08-21 ENCOUNTER — Telehealth: Payer: Self-pay

## 2021-08-21 NOTE — Chronic Care Management (AMB) (Signed)
    Called [name], No answer, left message of appointment on 08-22-2021 at 12:15 via telephone visit with Cherylin Mylar, Pharm D. Notified to have all medications, supplements, blood pressure and/or blood sugar logs available during appointment and to return call if need to reschedule.   Care Gaps: PNA Vac overdue Tdap overdue Shingrix overdue Colonoscopy overdue Covid booster overdue last completed 07-30-2020 Yearly ophthalmology exam overdue last completed 02-10-2020 Flu vaccine overdue last completed 06-11-2020 AWV 08-15-2021 cancelled  Star Rating Drug: Valsartan 160 mg- Last filled 05-08-2021 90 DS [facility] Atorvastatin 10 mg- Last filled 07-11-2021 30 [facility] Xultophy 100-3.6 mg- Last filled 05-01-2021 90 [facility]  Any gaps in medications fill history? Yes  Huey Romans [facility] Clinical Pharmacist Assistant [phone]

## 2021-08-22 ENCOUNTER — Telehealth: Payer: Medicare PPO

## 2021-08-22 NOTE — Progress Notes (Deleted)
Current Barriers:  {pharmacybarriers:24917}  Pharmacist Clinical Goal(s):  Patient will {PHARMACYGOALCHOICES:24921} through collaboration with PharmD and provider.   Interventions: 1:1 collaboration with [name], FNP regarding development and update of comprehensive plan of care as evidenced by provider attestation and co-signature Inter-disciplinary care team collaboration (see longitudinal plan of care) Comprehensive medication review performed; medication list updated in electronic medical record  {CCM [name] DISEASE STATES:25130}  Patient Goals/Self-Care Activities Patient will:  - {pharmacypatientgoals:24919}  Follow Up Plan: {CM FOLLOW UP KZLD:35701}   Chronic Care Management Pharmacy Note  08/22/2021 Name:  [name] MRN:  [id number] DOB:  Oct 10, 1955  Summary: ***  Recommendations/Changes made from today's visit: ***  Plan: ***   Subjective: [name] is an 65 y.o. year old female who is a primary patient of [name], [place].  The CCM team was consulted for assistance with disease management and care coordination needs.    {CCMTELEPHONEFACETOFACE:21091510} for {CCMINITIALFOLLOWUPCHOICE:21091511} in response to provider referral for pharmacy case management and/or care coordination services.   Consent to Services:  {CCMCONSENTOPTIONS:25074}  Patient Care Team: [name], FNP as PCP - General (General Practice) Rex Kras, Kristin Stapler, RN as Case Manager [name] as Social Worker  Recent office visits: ***  Recent consult visits: [name] visits: {[name] DC Yes/No:25215}   Objective:  Lab Results  Component Value Date   CREATININE 1.07 (H) 06/07/2021   BUN 22 06/07/2021   GFRNONAA 56 (L) 08/11/2020   GFRAA 64 08/11/2020   NA 144 06/07/2021   K 4.2 06/07/2021   CALCIUM 9.4 06/07/2021   CO2 23 06/07/2021   GLUCOSE 106 (H) 06/07/2021    Lab Results  Component Value Date/Time   HGBA1C 6.0 (H) 06/07/2021 03:55  PM   HGBA1C 5.9 (H) 03/06/2021 04:36 PM   MICROALBUR 150 01/12/2020 05:28 PM   MICROALBUR 150 07/28/2019 12:58 PM    Last diabetic Eye exam:  Lab Results  Component Value Date/Time   HMDIABEYEEXA No Retinopathy 02/10/2020 12:00 AM    Last diabetic Foot exam: No results found for: HMDIABFOOTEX   Lab Results  Component Value Date   CHOL 113 03/06/2021   HDL 40 03/06/2021   LDLCALC 55 03/06/2021   TRIG 94 03/06/2021   CHOLHDL 2.8 03/06/2021    Hepatic Function Latest Ref Rng & Units 03/06/2021 11/16/2020 08/11/2020  Total Protein 6.0 - 8.5 g/dL 6.7 6.9 6.7  Albumin 3.8 - 4.8 g/dL 4.2 4.2 4.0  AST 0 - 40 IU/L $Remov'10 15 12  'wDZrDK$ ALT 0 - 32 IU/L $Remov'12 10 14  'ffhlOe$ Alk Phosphatase 44 - 121 IU/L 112 100 90  Total Bilirubin 0.0 - 1.2 mg/dL <0.2 0.2 <0.2  Bilirubin, Direct - - - -    Lab Results  Component Value Date/Time   TSH 0.855 04/13/2020 02:51 PM   TSH 0.451 01/07/2020 11:00 AM   TSH 0.83 04/03/2018 12:00 AM    CBC Latest Ref Rng & Units 11/16/2020 08/11/2020 04/13/2020  WBC 3.4 - 10.8 x10E3/uL 12.6(H) 13.3(H) 11.9(H)  Hemoglobin 11.1 - 15.9 g/dL 12.1 11.6 11.9  Hematocrit 34.0 - 46.6 % 38.7 38.7 40.0  Platelets 150 - 450 x10E3/uL 346 327 266    Lab Results  Component Value Date/Time   VD25OH 26.8 (L) 08/11/2020 11:09 AM   VD25OH 44.2 11/18/2019 02:44 PM    Clinical ASCVD: No  The ASCVD Risk score (Arnett DK, et al., 2019) failed to calculate for the following reasons:   The valid total cholesterol range is 130 to 320  mg/dL    Depression screen [place] 2/9 07/11/2021 08/11/2020 04/13/2020  Decreased Interest 2 0 3  Down, Depressed, Hopeless 0 0 1  PHQ - 2 Score 2 0 4  Altered sleeping $RemoveBeforeDE'1 1 3  'CnxrUvoplGPJqvH$ Tired, decreased energy 0 1 1  Change in appetite 0 0 0  Feeling bad or failure about yourself  0 0 0  Trouble concentrating 0 0 0  Moving slowly or fidgety/restless 0 0 0  Suicidal thoughts 0 0 0  PHQ-9 Score $RemoveBef'3 2 8  'HnqHnnZGBE$ Difficult doing work/chores - Not difficult at all Somewhat difficult      ***Other: (CHADS2VASc if Afib, MMRC or CAT for COPD, ACT, DEXA)  Social History   Tobacco Use  Smoking Status Every Day   Packs/day: 1.00   Years: 30.00   Pack years: 30.00   Types: Cigarettes  Smokeless Tobacco Never  Tobacco Comments   started back in 2012   BP Readings from Last 3 Encounters:  06/07/21 132/78  03/06/21 122/78  12/21/20 118/62   Pulse Readings from Last 3 Encounters:  06/07/21 78  03/06/21 (!) 56  12/21/20 63   Wt Readings from Last 3 Encounters:  06/07/21 183 lb (83 kg)  03/06/21 186 lb 12.8 oz (84.7 kg)  12/21/20 189 lb (85.7 kg)   BMI Readings from Last 3 Encounters:  06/07/21 35.04 kg/m  03/06/21 35.76 kg/m  12/21/20 36.18 kg/m    Assessment/Interventions: Review of patient past medical history, allergies, medications, health status, including review of consultants reports, laboratory and other test data, was performed as part of comprehensive evaluation and provision of chronic care management services.   SDOH:  (Social Determinants of Health) assessments and interventions performed: {yes/no:20286}  SDOH Screenings   Alcohol Screen: Not on file  Depression (PHQ2-9): Low Risk    PHQ-2 Score: 3  Financial Resource Strain: Not on file  Food Insecurity: Not on file  Housing: Not on file  Physical Activity: Not on file  Social Connections: Not on file  Stress: Not on file  Tobacco Use: High Risk   Smoking Tobacco Use: Every Day   Smokeless Tobacco Use: Never   Passive Exposure: Not on file  Transportation Needs: Not on file    [place]  No Known Allergies  Medications Reviewed Today     Reviewed by Kristin Logan, RN (Registered Nurse) on 08/17/21 at [place] List Status: <None>   Medication Order Taking? Sig Documenting Provider Last Dose Status Informant  amLODipine (NORVASC) 5 MG tablet [id number] Yes TAKE 1 TABLET (5 MG TOTAL) BY MOUTH DAILY. [name], FNP Taking Active   aspirin 81 MG tablet [id number] Yes  Take 1 tablet (81 mg total) by mouth daily. Take x 1 month post op to decrease risk of blood clots. [name], FNP Taking Active   atorvastatin (LIPITOR) 10 MG tablet [id number] Yes Take 1 tablet (10 mg total) by mouth daily. [name], FNP Taking Active   dorzolamide-timolol (COSOPT) 22.3-6.8 MG/ML ophthalmic solution [id number] Yes Place 1 drop into both eyes 2 (two) times daily. [provider] Taking Active   escitalopram (LEXAPRO) 10 MG tablet [id number] Yes TAKE 1 TABLET (10 MG TOTAL) BY MOUTH DAILY. [name], FNP Taking Active   ferrous sulfate 325 (65 FE) MG tablet [id number]  Take 1 tablet (325 mg total) by mouth 2 (two) times daily with a meal. Darliss Cheney, MD  Expired 07/11/21 2359   glucose blood (ACCU-CHEK AVIVA PLUS) test strip [id number]  Use as  instructed [name], FNP  Active   hydrocortisone cream 1 % [id number] Yes Apply to affected area 2 times daily [name], FNP Taking Active   Insulin Degludec-Liraglutide (XULTOPHY) 100-3.6 UNIT-MG/ML SOPN [id number] Yes Inject 15 Units into the skin daily. [name], FNP Taking Active   Insulin Pen Needle (BD PEN NEEDLE NANO U/F) 32G X 4 MM MISC [id number]  USE FOR 1 INJECTION DAILY Glendale Chard, MD  Active Self  Lancets (ONETOUCH DELICA PLUS [id number]) MISC [id number]  USE TO CHECK BLOOD SUGAR ONCE A DAY AS INSTRUCTED [name], FNP  Active   valsartan (DIOVAN) 160 MG tablet [id number] Yes TAKE 1 TABLET (160 MG TOTAL) BY MOUTH DAILY. [name], FNP Taking Active   Vitamin D, Ergocalciferol, (DRISDOL) 1.25 MG (50000 UNIT) CAPS capsule [id number] Yes TAKE 1 CAPSULE (50,000 UNITS TOTAL) BY MOUTH EVERY 7 (SEVEN) DAYS. Bary Castilla, NP Taking Active             Patient Active Problem List   Diagnosis Date Noted   BMI 36.0-36.9,adult 01/07/2021   CMV (cytomegalovirus) antibody positive 03/02/2020   Iron deficiency anemia 01/08/2020   Folate deficiency anemia 01/07/2020   Chronic back pain  01/07/2020   Tobacco dependence 01/07/2020   Hypertensive retinopathy of both eyes 02/18/2019   Combined form of age-related cataract, both eyes 02/18/2019   Closed displaced oblique fracture of shaft of left humerus 12/17/2018   Insomnia 06/08/2018   Essential hypertension 06/08/2018   Olecranon bursitis, left elbow 06/19/2017   Closed fracture of distal end of right fibula and tibia 06/10/2016   Diabetes ([place]) 06/10/2016   Traumatic closed displaced fracture of shaft of right tibia and fibula 06/10/2016    Immunization History  Administered Date(s) Administered   Influenza,inj,Quad PF,6+ Mos 07/28/2019, 08/11/2020   PFIZER(Purple Top)SARS-COV-2 Vaccination 11/16/2019, 12/11/2019, 07/30/2020    Conditions to be addressed/monitored:  {USCCMDZASSESSMENTOPTIONS:23563}  There are no care plans that you recently modified to display for this patient.    Medication Assistance: {MEDASSISTANCEINFO:25044}  Compliance/Adherence/Medication fill history: Care Gaps: ***  Star-Rating Drugs: ***  Patient's preferred pharmacy is:  [place], [place] [address] Phone: [phone] Fax: [phone]  [id number] - [place], [place] [address] [place] [address] Phone: [phone] Fax: [place], [place] - [place] [name] [place] [address] Phone: [phone] Fax: [phone]  [place] ([place] [place] - [address] [place] ([place]) [place] [address] Phone: [phone] Fax: [phone]  Uses pill box? {Yes or If no, why not?:20788} Pt endorses ***% compliance  We discussed: {Pharmacy options:24294} Patient decided to: {US Pharmacy Plan:23885}  Care Plan and Follow Up Patient Decision:   {FOLLOWUP:24991}  Plan: {CM FOLLOW UP PLAN:25073}  ***

## 2021-08-24 ENCOUNTER — Other Ambulatory Visit: Payer: Self-pay

## 2021-08-24 ENCOUNTER — Ambulatory Visit (INDEPENDENT_AMBULATORY_CARE_PROVIDER_SITE_OTHER): Payer: Medicare PPO

## 2021-08-24 DIAGNOSIS — I10 Essential (primary) hypertension: Secondary | ICD-10-CM

## 2021-08-24 DIAGNOSIS — E559 Vitamin D deficiency, unspecified: Secondary | ICD-10-CM

## 2021-08-24 DIAGNOSIS — E119 Type 2 diabetes mellitus without complications: Secondary | ICD-10-CM

## 2021-08-24 MED ORDER — NOVOFINE PLUS PEN NEEDLE 32G X 4 MM MISC: 3 refills | Status: DC

## 2021-08-24 MED ORDER — TRESIBA FLEXTOUCH 100 UNIT/ML ~~LOC~~ SOPN: 10.0000 [IU] | PEN_INJECTOR | Freq: Every day | SUBCUTANEOUS | 1 refills | Status: DC

## 2021-08-24 MED ORDER — RYBELSUS 7 MG PO TABS: 7.0000 mg | ORAL_TABLET | Freq: Every day | ORAL | 1 refills | Status: DC

## 2021-08-24 NOTE — Patient Instructions (Signed)
Social Worker Visit Information  Goals we discussed today:  Patient Goals/Self-Care Activities patient will:  Pick up insulin samples from providers office  - Patient will self administer medications as prescribed Patient will attend all scheduled provider appointments Engage with RN Care Manager and PharmD to address care management needs Contact SW as needed  Materials Provided: No: Patient declined  Patient verbalizes understanding of instructions provided today and agrees to view in MyChart.   Follow Up Plan:  No planned SW follow up at this time. Please contact me as needed   Bevelyn Ngo, BSW, CDP Social Worker, Certified Dementia Practitioner [facility] Care Management [phone]

## 2021-08-24 NOTE — Chronic Care Management (AMB) (Signed)
Chronic Care Management    Social Work Note  08/24/2021 Name: [name] MRN: [id number] DOB: 04-22-1956  [name] is a 65 y.o. year old female who is a primary care patient of Arnette Felts, FNP. The CCM team was consulted to assist the patient with chronic disease management and/or care coordination needs related to:  DM II, HTN, Depression .   Engaged with patient by telephone for follow up visit in response to provider referral for social work chronic care management and care coordination services.   Consent to Services:  The patient was given information about Chronic Care Management services, agreed to services, and gave verbal consent prior to initiation of services.  Please see initial visit note for detailed documentation.   Patient agreed to services and consent obtained.   Assessment: Review of patient past medical history, allergies, medications, and health status, including review of relevant consultants reports was performed today as part of a comprehensive evaluation and provision of chronic care management and care coordination services.     SDOH (Social Determinants of Health) assessments and interventions performed:    Advanced Directives Status: Not addressed in this encounter.  CCM Care Plan  No Known Allergies  Outpatient Encounter Medications as of 08/24/2021  Medication Sig   amLODipine (NORVASC) 5 MG tablet TAKE 1 TABLET (5 MG TOTAL) BY MOUTH DAILY.   aspirin 81 MG tablet Take 1 tablet (81 mg total) by mouth daily. Take x 1 month post op to decrease risk of blood clots.   atorvastatin (LIPITOR) 10 MG tablet Take 1 tablet (10 mg total) by mouth daily.   dorzolamide-timolol (COSOPT) 22.3-6.8 MG/ML ophthalmic solution Place 1 drop into both eyes 2 (two) times daily.   escitalopram (LEXAPRO) 10 MG tablet TAKE 1 TABLET (10 MG TOTAL) BY MOUTH DAILY.   ferrous sulfate 325 (65 FE) MG tablet Take 1 tablet (325 mg total) by mouth 2 (two) times daily  with a meal.   glucose blood (ACCU-CHEK AVIVA PLUS) test strip Use as instructed   hydrocortisone cream 1 % Apply to affected area 2 times daily   Insulin Degludec-Liraglutide (XULTOPHY) 100-3.6 UNIT-MG/ML SOPN Inject 15 Units into the skin daily.   Insulin Pen Needle (BD PEN NEEDLE NANO U/F) 32G X 4 MM MISC USE FOR 1 INJECTION DAILY   Lancets (ONETOUCH DELICA PLUS LANCET33G) MISC USE TO CHECK BLOOD SUGAR ONCE A DAY AS INSTRUCTED   valsartan (DIOVAN) 160 MG tablet TAKE 1 TABLET (160 MG TOTAL) BY MOUTH DAILY.   Vitamin D, Ergocalciferol, (DRISDOL) 1.25 MG (50000 UNIT) CAPS capsule TAKE 1 CAPSULE (50,000 UNITS TOTAL) BY MOUTH EVERY 7 (SEVEN) DAYS.   No facility-administered encounter medications on file as of 08/24/2021.    Patient Active Problem List   Diagnosis Date Noted   BMI 36.0-36.9,adult 01/07/2021   CMV (cytomegalovirus) antibody positive 03/02/2020   Iron deficiency anemia 01/08/2020   Folate deficiency anemia 01/07/2020   Chronic back pain 01/07/2020   Tobacco dependence 01/07/2020   Hypertensive retinopathy of both eyes 02/18/2019   Combined form of age-related cataract, both eyes 02/18/2019   Closed displaced oblique fracture of shaft of left humerus 12/17/2018   Insomnia 06/08/2018   Essential hypertension 06/08/2018   Olecranon bursitis, left elbow 06/19/2017   Closed fracture of distal end of right fibula and tibia 06/10/2016   Diabetes (HCC) 06/10/2016   Traumatic closed displaced fracture of shaft of right tibia and fibula 06/10/2016    Conditions to be addressed/monitored: CAD, DMII, and  Depression; Financial constraints related to costs of medications  Care Plan : Social Work Plan of Care  Updates made by Bevelyn Ngo since 08/24/2021 12:00 AM  Completed 08/24/2021   Problem: Barriers to Treatment Resolved 08/24/2021     Goal: Barriers to Treatment Identified and Managed Completed 08/24/2021  Start Date: 08/24/2021  Priority: High  Note:   Current  Barriers:  Chronic disease management support and education needs related to HTN, DM, and Depression  Financial constraints related to costs of medication  Social Worker Clinical Goal(s):  patient will work with SW to identify and address any acute and/or chronic care coordination needs related to the self health management of HTN, DM, and Depression  patient will work with primary care team to address needs related to medication regimen SW Interventions:  Inter-disciplinary care team collaboration (see longitudinal plan of care) Collaboration with Arnette Felts, FNP regarding development and update of comprehensive plan of care as evidenced by provider attestation and co-signature Collaboration with RN Care Manager who requests SW outreach the patient to discuss transportation options as well as how to apply for Medicaid Successful outbound call placed to the patient to assist with care coordination needs Patient is knowledgeable of medical transportation options but is interested in transportation for non-medical needs Discussed patient may access a bus system or apply for SCAT if desired- patient is not interested in exploring transportation options at this time Reviewed income requirements for Medicaid - patient reports she is well over the income limit for Medicaid Assessed for patient interest in applying just in case - patient declined Determined the patient is currently out of her insulin - patient reports she has been out for four days and is unable to purchase more due to costs Discussed opportunity for patient to contact her primary care provider to request samples - patient reports she has contacted the pharmacy team and was advised samples were unavailable Reviewed option to request to switch to a more affordable option - patient reports she has but must wait until her appointment on 12/18 Discussed the patients primary provider is out of the office but this SW plans to reach out to  her primary care providers office to request they contact the patient if a solution is available Advised the patient to dial 911 or visit the ED if she begins experiencing symptoms of hyperglycemia Collaboration with Dr. Allyne Gee, Angle Little RN Care Manager, and Cherylin Mylar PharmD to discuss above concerns Informed by Dr. Allyne Gee that CMA Delana Meyer is working on gathering samples for the patient and will contact her to review instructions- advised patient agreeable to pick up samples today prior to 5:00   Patient Goals/Self-Care Activities patient will:  Pick up insulin samples from providers office  - Patient will self administer medications as prescribed Patient will attend all scheduled provider appointments Engage with RN Care Manager and PharmD to address care management needs Contact SW as needed         Follow Up Plan:  No SW follow up planned at this time. The patient will remain engaged with RN Care Manager and PharmD to address care management needs.      Bevelyn Ngo, BSW, CDP Social Worker, Certified Dementia Practitioner [facility] Care Management [phone]

## 2021-09-06 ENCOUNTER — Ambulatory Visit (INDEPENDENT_AMBULATORY_CARE_PROVIDER_SITE_OTHER): Payer: Medicare PPO | Admitting: Nurse Practitioner

## 2021-09-06 ENCOUNTER — Encounter: Payer: Self-pay | Admitting: Nurse Practitioner

## 2021-09-06 ENCOUNTER — Other Ambulatory Visit: Payer: Self-pay

## 2021-09-06 VITALS — BP 130/78 | HR 79 | Temp 98.4°F | Ht 60.6 in | Wt 178.0 lb

## 2021-09-06 DIAGNOSIS — Z0000 Encounter for general adult medical examination without abnormal findings: Secondary | ICD-10-CM | POA: Diagnosis not present

## 2021-09-06 DIAGNOSIS — F17200 Nicotine dependence, unspecified, uncomplicated: Secondary | ICD-10-CM | POA: Diagnosis not present

## 2021-09-06 DIAGNOSIS — E119 Type 2 diabetes mellitus without complications: Secondary | ICD-10-CM

## 2021-09-06 DIAGNOSIS — I2510 Atherosclerotic heart disease of native coronary artery without angina pectoris: Secondary | ICD-10-CM | POA: Diagnosis not present

## 2021-09-06 DIAGNOSIS — I10 Essential (primary) hypertension: Secondary | ICD-10-CM | POA: Diagnosis not present

## 2021-09-06 DIAGNOSIS — E559 Vitamin D deficiency, unspecified: Secondary | ICD-10-CM | POA: Diagnosis not present

## 2021-09-06 DIAGNOSIS — Z23 Encounter for immunization: Secondary | ICD-10-CM

## 2021-09-06 DIAGNOSIS — Z1211 Encounter for screening for malignant neoplasm of colon: Secondary | ICD-10-CM

## 2021-09-06 LAB — CBC
Hematocrit: 41.8 % (ref 34.0–46.6)
Hemoglobin: 12.9 g/dL (ref 11.1–15.9)
MCH: 23.5 pg — ABNORMAL LOW (ref 26.6–33.0)
MCHC: 30.9 g/dL — ABNORMAL LOW (ref 31.5–35.7)
MCV: 76 fL — ABNORMAL LOW (ref 79–97)
Platelets: 294 10*3/uL (ref 150–450)
RBC: 5.5 x10E6/uL — ABNORMAL HIGH (ref 3.77–5.28)
RDW: 16.2 % — ABNORMAL HIGH (ref 11.7–15.4)
WBC: 10.9 10*3/uL — ABNORMAL HIGH (ref 3.4–10.8)

## 2021-09-06 LAB — CMP14+EGFR
ALT: 9 IU/L (ref 0–32)
AST: 12 IU/L (ref 0–40)
Albumin/Globulin Ratio: 1.5 (ref 1.2–2.2)
Albumin: 4.1 g/dL (ref 3.8–4.8)
Alkaline Phosphatase: 84 IU/L (ref 44–121)
BUN/Creatinine Ratio: 17 (ref 12–28)
BUN: 19 mg/dL (ref 8–27)
Bilirubin Total: 0.3 mg/dL (ref 0.0–1.2)
CO2: 22 mmol/L (ref 20–29)
Calcium: 9.4 mg/dL (ref 8.7–10.3)
Chloride: 104 mmol/L (ref 96–106)
Creatinine, Ser: 1.13 mg/dL — ABNORMAL HIGH (ref 0.57–1.00)
Globulin, Total: 2.7 g/dL (ref 1.5–4.5)
Glucose: 77 mg/dL (ref 70–99)
Potassium: 4.2 mmol/L (ref 3.5–5.2)
Sodium: 139 mmol/L (ref 134–144)
Total Protein: 6.8 g/dL (ref 6.0–8.5)
eGFR: 54 mL/min/{1.73_m2} — ABNORMAL LOW (ref >59)

## 2021-09-06 LAB — HEMOGLOBIN A1C
Est. average glucose Bld gHb Est-mCnc: 123 mg/dL
Hgb A1c MFr Bld: 5.9 % — ABNORMAL HIGH (ref 4.8–5.6)

## 2021-09-06 LAB — POCT URINALYSIS DIPSTICK
Blood, UA: NEGATIVE
Glucose, UA: NEGATIVE
Ketones, UA: 15
Leukocytes, UA: NEGATIVE
Nitrite, UA: NEGATIVE
Protein, UA: POSITIVE — AB
Spec Grav, UA: >=1.03 — AB (ref 1.010–1.025)
Urobilinogen, UA: 1 E.U./dL
pH, UA: 5.5 (ref 5.0–8.0)

## 2021-09-06 LAB — POCT UA - MICROALBUMIN
Creatinine, POC: 300 mg/dL
Microalbumin Ur, POC: 150 mg/L

## 2021-09-06 LAB — LIPID PANEL
Chol/HDL Ratio: 2.8 ratio (ref 0.0–4.4)
Cholesterol, Total: 111 mg/dL (ref 100–199)
HDL: 40 mg/dL (ref >39)
LDL Chol Calc (NIH): 49 mg/dL (ref 0–99)
Triglycerides: 119 mg/dL (ref 0–149)
VLDL Cholesterol Cal: 22 mg/dL (ref 5–40)

## 2021-09-06 NOTE — Patient Instructions (Signed)
°  Ms. Lueras , Thank you for taking time to come for your Medicare Wellness Visit. I appreciate your ongoing commitment to your health goals. Please review the following plan we discussed and let me know if I can assist you in the future.   These are the goals we discussed:  Goals      Manage My Medicine     Timeframe:  Long-Range Goal Priority:  High Start Date:                             Expected End Date:                       Follow Up Date 08/22/2021    - call for medicine refill 2 or 3 days before it runs out - call if I am sick and can't take my medicine - keep a list of all the medicines I take; vitamins and herbals too - use a pillbox to sort medicine - use an alarm clock or phone to remind me to take my medicine    Why is this important?   These steps will help you keep on track with your medicines.   Notes:  Please call if you have any questions or concerns about your medications.         This is a list of the screening recommended for you and due dates:  Health Maintenance  Topic Date Due   Pneumonia Vaccine (1 - PCV) Never done   Tetanus Vaccine  Never done   Zoster (Shingles) Vaccine (1 of 2) Never done   Colon Cancer Screening  Never done   COVID-19 Vaccine (4 - Booster for Pfizer series) 09/24/2020   Eye exam for diabetics  02/09/2021   Flu Shot  04/10/2021   DEXA scan (bone density measurement)  Never done   Complete foot exam   08/11/2021   Hemoglobin A1C  12/05/2021   Mammogram  04/27/2023   Hepatitis C Screening: USPSTF Recommendation to screen - Ages 18-79 yo.  Completed   HIV Screening  Completed   HPV Vaccine  Aged Out   Pap Smear  Discontinued

## 2021-09-06 NOTE — Progress Notes (Signed)
Subjective:    [name] is a 65 y.o. female who presents for a Welcome to Medicare exam.   The patient is here for medicare visit.  She is doing okay. She does not really have a appetite.  She is still smoking and doing mariajuana. She is going through a lot of stress due to family issues. She is taking the rybelsus. She states that has lowered her appetite.  Diet: she is trying to eat healthy.  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  HENT:  Negative for congestion, hearing loss and sore throat.   Eyes:  Negative for pain, discharge and redness.  Respiratory:  Negative for cough.   Cardiovascular:  Negative for chest pain.  Gastrointestinal:  Negative for constipation, diarrhea, nausea and vomiting.  Musculoskeletal:  Negative for myalgias.  Neurological:  Negative for dizziness and headaches.          Objective:    Physical Exam Vitals and nursing note reviewed.  Constitutional:      Appearance: Normal appearance. She is obese.  HENT:     Head: Normocephalic and atraumatic.     Right Ear: Tympanic membrane normal. There is no impacted cerumen.     Left Ear: Tympanic membrane normal. There is no impacted cerumen.     Nose: Nose normal. No congestion or rhinorrhea.     Mouth/Throat:     Mouth: Mucous membranes are moist.  Cardiovascular:     Rate and Rhythm: Normal rate and regular rhythm.     Pulses: Normal pulses.     Heart sounds: Normal heart sounds. No murmur heard. Pulmonary:     Effort: Pulmonary effort is normal. No respiratory distress.     Breath sounds: No wheezing.  Chest:  Breasts:    Tanner Score is 5.     Right: Normal.     Left: Normal.  Abdominal:     General: Bowel sounds are normal.  Genitourinary:    Comments: Deferred  Musculoskeletal:        General: Normal range of motion.     Cervical back: Normal range of motion and  neck supple.  Skin:    General: Skin is warm and dry.     Capillary Refill: Capillary refill takes less than 2 seconds.  Neurological:     Mental Status: She is alert and oriented to person, place, and time.  Psychiatric:        Mood and Affect: Affect normal.    Today's Vitals   09/06/21 0958  BP: 130/78  Pulse: 79  Temp: 98.4 F (36.9 C)  TempSrc: Oral  Weight: 178 lb (80.7 kg)  Height: 5' 0.6" (1.539 m)  Body mass index is 34.08 kg/m.  Medications Outpatient Encounter Medications as of 09/06/2021  Medication Sig   amLODipine (NORVASC) 5 MG tablet TAKE 1 TABLET (5 MG TOTAL) BY MOUTH DAILY.   aspirin 81 MG tablet Take 1 tablet (81 mg total) by mouth daily. Take x 1 month post op to decrease risk of blood clots.   atorvastatin (LIPITOR) 10 MG tablet Take 1 tablet (10 mg total) by mouth daily.   dorzolamide-timolol (COSOPT) 22.3-6.8 MG/ML ophthalmic solution Place 1 drop into both eyes 2 (two) times daily.   escitalopram (LEXAPRO) 10 MG tablet TAKE 1 TABLET (10 MG TOTAL) BY MOUTH DAILY.   ferrous sulfate 325 (65 FE) MG tablet Take  1 tablet (325 mg total) by mouth 2 (two) times daily with a meal.   glucose blood (ACCU-CHEK AVIVA PLUS) test strip Use as instructed   hydrocortisone cream 1 % Apply to affected area 2 times daily   insulin degludec (TRESIBA FLEXTOUCH) 100 UNIT/ML FlexTouch Pen Inject 10 Units into the skin at bedtime.   Insulin Pen Needle (BD PEN NEEDLE NANO U/F) 32G X 4 MM MISC USE FOR 1 INJECTION DAILY   Insulin Pen Needle (NOVOFINE PLUS PEN NEEDLE) 32G X 4 MM MISC Use with tresiba   Lancets (ONETOUCH DELICA PLUS ZOXWRU04V) MISC USE TO CHECK BLOOD SUGAR ONCE A DAY AS INSTRUCTED   Semaglutide (RYBELSUS) 7 MG TABS Take 7 mg by mouth daily. Take 56m by mouth daily. Take when you wake with 4 oz of water. Wait 30 mins to eat or drink anything.   valsartan (DIOVAN) 160 MG tablet TAKE 1 TABLET (160 MG TOTAL) BY MOUTH DAILY.   Vitamin D, Ergocalciferol, (DRISDOL) 1.25 MG  (50000 UNIT) CAPS capsule TAKE 1 CAPSULE (50,000 UNITS TOTAL) BY MOUTH EVERY 7 (SEVEN) DAYS.   No facility-administered encounter medications on file as of 09/06/2021.     History: Past Medical History:  Diagnosis Date   Anemia 01/07/2020   REQUIRING TRANSFUSION   Back pain    Diabetes mellitus without complication (HCC)    GERD (gastroesophageal reflux disease)    Hypertension    Olecranon bursitis of left elbow    Past Surgical History:  Procedure Laterality Date   ABDOMINAL HYSTERECTOMY     spared ovaries   OLECRANON BURSECTOMY Left 06/19/2017   Procedure: Excision olecranon bursa;  Surgeon: GDorna Leitz MD;  [facility]  Service: Orthopedics;  Laterality: Left;  Panel Length: 60 mins    TIBIA IM NAIL INSERTION Right 06/10/2016   Procedure: INTRAMEDULLARY (IM) NAIL TIBIAL RODING;  Surgeon: JDorna Leitz MD;  [facility] [facility]  Service: Orthopedics;  Laterality: Right;    Family History  Problem Relation Age of Onset   Diabetes Mother    Hypertension Mother    Diabetes Father    Hypertension Father    Breast cancer Sister    Breast cancer Sister    Breast cancer Paternal Aunt    Social History   Occupational History   Occupation: retired  Tobacco Use   Smoking status: Every Day    Packs/day: 1.00    Years: 30.00    Pack years: 30.00    Types: Cigarettes   Smokeless tobacco: Never   Tobacco comments:    started back in 2012  Vaping Use   Vaping Use: Never used  Substance and Sexual Activity   Alcohol use: No    Comment: opccasionally    Drug use: No   Sexual activity: Yes    Tobacco Counseling Ready to quit: Not Answered Counseling given: Not Answered Tobacco comments: started back in 2012   Immunizations and Health Maintenance Immunization History  Administered Date(s) Administered   Influenza, High Dose Seasonal PF 09/06/2021   Influenza,inj,Quad PF,6+ Mos 07/28/2019, 08/11/2020   PFIZER(Purple Top)SARS-COV-2 Vaccination  11/16/2019, 12/11/2019, 07/30/2020   Tdap 09/06/2021   Health Maintenance Due  Topic Date Due   Pneumonia Vaccine 65 Years old (1 - PCV) Never done   Zoster Vaccines- Shingrix (1 of 2) Never done   COLONOSCOPY (Pts 45-419yrInsurance coverage will need to be confirmed)  Never done   COVID-19 Vaccine (4 - Booster for [place]) 09/24/2020   OPHTHALMOLOGY EXAM  02/09/2021   DEXA SCAN  Never done   FOOT EXAM  08/11/2021    Activities of Daily Living In your present state of health, do you have any difficulty performing the following activities: 09/06/2021 06/07/2021  Hearing? N N  Vision? Y N  Difficulty concentrating or making decisions? N N  Walking or climbing stairs? Y N  Dressing or bathing? Y Y  Comment - have shower chair  Doing errands, shopping? N N  Some recent data might be hidden    Physical Exam  (optional), or other factors deemed appropriate based on the beneficiary's medical and social history and current clinical standards.  Advanced Directives: Does Patient Have a Medical Advance Directive?: No Would patient like information on creating a medical advance directive?: Yes (MAU/Ambulatory/Procedural Areas - Information given)    Assessment:    This is a routine wellness examination for this patient .   Vision/Hearing screen Hearing Screening   500Hz 1000Hz 3000Hz 4000Hz  Right ear Pass Fail Pass Pass  Left ear Pass Fail Pass Fail   Vision Screening   Right eye Left eye Both eyes  Without correction 20/50 20/200 20/50  With correction       Dietary issues and exercise activities discussed:      Goals      Manage My Medicine     Timeframe:  Long-Range Goal Priority:  High Start Date:                             Expected End Date:                       Follow Up Date 08/22/2021    - call for medicine refill 2 or 3 days before it runs out - call if I am sick and can't take my medicine - keep a list of all the medicines I take; vitamins and  herbals too - use a pillbox to sort medicine - use an alarm clock or phone to remind me to take my medicine    Why is this important?   These steps will help you keep on track with your medicines.   Notes:  Please call if you have any questions or concerns about your medications.        Depression Screen PHQ 2/9 Scores 09/06/2021 09/06/2021 07/11/2021 08/11/2020  PHQ - 2 Score 2 0 2 0  PHQ- 9 Score _0 Fall Risk Fall Risk  09/06/2021  Falls in the past year? 0  Number falls in past yr: 0  Injury with Fall? 0  Risk for fall due to : -    Cognitive Function: MMSE - [place] Exam 09/06/2021  Orientation to time 5  Orientation to Place 5  Registration 3  Attention/ Calculation 5  Recall 3  Language- name 2 objects 2  Language- repeat 1  Language- follow 3 step command 3  Language- read & follow direction 1  Write a sentence 1  Copy design 1  Total score 30     6CIT Screen 09/06/2021  What Year? 0 points  What month? 0 points  What time? 0 points  Count back from 20 0 points  Months in reverse 0 points  Repeat phrase 4 points  Total Score 4    Patient Care Team: [name], FNP as PCP - General ([place]) Little, Claudette Stapler, RN as Case  Manager     Plan:   1. Welcome to Medicare preventive visit  Ms. Schnepp , Thank you for taking time to come for your Medicare Wellness Visit. I appreciate your ongoing commitment to your health goals. Please review the following plan we discussed and let me know if I can assist you in the future.   These are the goals we discussed:  Goals      Manage My Medicine     Timeframe:  Long-Range Goal Priority:  High Start Date:                             Expected End Date:                       Follow Up Date 08/22/2021    - call for medicine refill 2 or 3 days before it runs out - call if I am sick and can't take my medicine - keep a list of all the medicines I take; vitamins and herbals  too - use a pillbox to sort medicine - use an alarm clock or phone to remind me to take my medicine    Why is this important?   These steps will help you keep on track with your medicines.   Notes:  Please call if you have any questions or concerns about your medications.         This is a list of the screening recommended for you and due dates:  Health Maintenance  Topic Date Due   Pneumonia Vaccine (1 - PCV) Never done   Zoster (Shingles) Vaccine (1 of 2) Never done   Colon Cancer Screening  Never done   COVID-19 Vaccine (4 - Booster for Pfizer series) 09/24/2020   Eye exam for diabetics  02/09/2021   DEXA scan (bone density measurement)  Never done   Complete foot exam   08/11/2021   Hemoglobin A1C  12/05/2021   Mammogram  04/27/2023   Tetanus Vaccine  09/07/2031   Flu Shot  Completed   Hepatitis C Screening: USPSTF Recommendation to screen - Ages 18-79 yo.  Completed   HIV Screening  Completed   HPV Vaccine  Aged Out   Pap Smear  Discontinued    Welcome to Medicare preventive visit  -Patient was seen today for a medicare wellness visit. All questions were answered. Patient verbalized understanding of her visit today.  -Behavior modification was discussed as well as diet and exercise history  -Patient will continue to exercise regularly and modify their diet.  -Recommendation for yearly physical annuals, immunization and screenings including mammogram and colonoscopy were discussed with the patient.  -Recommended intake of multivitamin, vitamin D and calcium.  -Individualized advise was given to the patient pertaining to their own health history in regards to diet, exercise, medical condition and referrals.   2. Type 2 diabetes mellitus without complication, without long-term current use of insulin (HCC) -Chronic, continue meds.  - Hemoglobin A1c  3. Essential hypertension -Chronic, stable -Continue amlodipine  -Limit the intake of processed foods and salt intake.  You should increase your intake of green vegetables and fruits. Limit the use of alcohol. Limit fast foods and fried foods. Avoid high fatty saturated and trans fat foods. Keep yourself hydrated with drinking water. Avoid red meats. Eat lean meats instead. Exercise for atleast 30-45 min for atleast 4-5 times a week.  - CBC - CMP14+EGFR - EKG 12-Lead -  POCT Urinalysis Dipstick (81002) - POCT UA - Microalbumin   4. Atherosclerotic cardiovascular disease - Lipid panel  5. Tobacco dependence -Given information and advised patient on the cessation of smoking and other tobacco products.   6. Encounter for screening colonoscopy - Ambulatory referral to Gastroenterology  7. Need for influenza vaccination - Flu vaccine HIGH DOSE PF (Fluzone High dose)  8. Need for Tdap vaccination -TDAP prescription sent to the patients pharmacy.   I have personally reviewed and noted the following in the patients chart:   Medical and social history Use of alcohol, tobacco or illicit drugs  Current medications and supplements Functional ability and status Nutritional status Physical activity Advanced directives List of other physicians Hospitalizations, surgeries, and ER visits in previous 12 months Vitals Screenings to include cognitive, depression, and falls Referrals and appointments  In addition, I have reviewed and discussed with patient certain preventive protocols, quality metrics, and best practice recommendations. A written personalized care plan for preventive services as well as general preventive health recommendations were provided to patient.     Bary Castilla, NP 09/06/2021

## 2021-09-09 DIAGNOSIS — E119 Type 2 diabetes mellitus without complications: Secondary | ICD-10-CM | POA: Diagnosis not present

## 2021-09-09 DIAGNOSIS — I10 Essential (primary) hypertension: Secondary | ICD-10-CM | POA: Diagnosis not present

## 2021-09-11 ENCOUNTER — Encounter (INDEPENDENT_AMBULATORY_CARE_PROVIDER_SITE_OTHER): Payer: Medicare PPO | Admitting: Ophthalmology

## 2021-09-11 ENCOUNTER — Other Ambulatory Visit: Payer: Self-pay | Admitting: Nurse Practitioner

## 2021-09-11 DIAGNOSIS — F3289 Other specified depressive episodes: Secondary | ICD-10-CM

## 2021-09-12 ENCOUNTER — Other Ambulatory Visit: Payer: Self-pay

## 2021-09-12 DIAGNOSIS — E559 Vitamin D deficiency, unspecified: Secondary | ICD-10-CM

## 2021-09-12 MED ORDER — VITAMIN D (ERGOCALCIFEROL) 1.25 MG (50000 UNIT) PO CAPS: 50000.0000 [IU] | ORAL_CAPSULE | ORAL | 0 refills | Status: DC

## 2021-09-13 ENCOUNTER — Telehealth: Payer: Self-pay

## 2021-09-13 NOTE — Chronic Care Management (AMB) (Signed)
° °  Called ABILENE MCPHEE, No answer, left message of appointment on 09-15-2020 at 12:00 via telephone visit with Cherylin Mylar, Pharm D. Notified to have all medications, supplements, blood pressure and/or blood sugar logs available during appointment and to return call if need to reschedule.  Care Gaps: PNA Vac overdue Shingrix overdue Colonoscopy overdue Covid booster overdue last completed 07-30-2020 Yearly ophthalmology exam overdue last completed 02-10-2020 Flu vaccine overdue last completed 06-11-2020  Star Rating Drug: Valsartan 160 mg- Last filled 05-08-2021 90 [facility] Atorvastatin 10 mg- Last filled 07-11-2021 30 [facility] Xultophy 100-3.6 mg- Last filled 05-01-2021 90 [facility]    Any gaps in medications fill history? Yes  Huey Romans [facility] Clinical Pharmacist Assistant [phone]

## 2021-09-14 ENCOUNTER — Telehealth: Payer: Self-pay

## 2021-09-14 NOTE — Progress Notes (Signed)
° ° °  Chronic Care Management Pharmacy Assistant   Name: [name]  MRN: [id number] DOB: 1956/07/03  Reason for Encounter: Reminder call for appointment with CPP on 09/15/2021 at 12:00pm, voicemail left.     Doreen Beam Clinical Pharmacist Assistant [phone]

## 2021-09-15 ENCOUNTER — Ambulatory Visit (INDEPENDENT_AMBULATORY_CARE_PROVIDER_SITE_OTHER): Payer: Medicare PPO

## 2021-09-15 DIAGNOSIS — Z720 Tobacco use: Secondary | ICD-10-CM

## 2021-09-15 DIAGNOSIS — E119 Type 2 diabetes mellitus without complications: Secondary | ICD-10-CM

## 2021-09-15 DIAGNOSIS — I10 Essential (primary) hypertension: Secondary | ICD-10-CM

## 2021-09-15 NOTE — Progress Notes (Signed)
Chronic Care Management Pharmacy Note  09/25/2021 Name:  [name] MRN:  [id number] DOB:  April 16, 1956  Summary: Patient reports that she has been taking her medication daily, but prefers taking Xultophy.   Recommendations/Changes made from today's visit: Recommend patient be started on smoking cessation medication.   Plan: Patient to start using nicotine lozenge medication for smoking cessation.    Subjective: [name] is an 66 y.o. year old female who is a primary patient of [name], [place].  The CCM team was consulted for assistance with disease management and care coordination needs.    Engaged with patient by telephone for follow up visit in response to provider referral for pharmacy case management and/or care coordination services. Her niece helps her with her medications.   Consent to Services:  The patient was given information about Chronic Care Management services, agreed to services, and gave verbal consent prior to initiation of services.  Please see initial visit note for detailed documentation.   Patient Care Team: [name], FNP as PCP - General (General Practice) Lynne Logan, RN as Case Manager  Recent office visits: 06/07/2021 PCP OV 09/06/2021 Welcome to Medicare OV  Recent consult visits: None noted in the past 6 months   Hospital visits: None in previous 6 months   Objective:  Lab Results  Component Value Date   CREATININE 1.13 (H) 09/06/2021   BUN 19 09/06/2021   GFRNONAA 56 (L) 08/11/2020   GFRAA 64 08/11/2020   NA 139 09/06/2021   K 4.2 09/06/2021   CALCIUM 9.4 09/06/2021   CO2 22 09/06/2021   GLUCOSE 77 09/06/2021    Lab Results  Component Value Date/Time   HGBA1C 5.9 (H) 09/06/2021 10:45 AM   HGBA1C 6.0 (H) 06/07/2021 03:55 PM   MICROALBUR 150 09/06/2021 03:51 PM   MICROALBUR 150 01/12/2020 05:28 PM    Last diabetic Eye exam:  Lab Results  Component Value Date/Time   HMDIABEYEEXA No Retinopathy  02/10/2020 12:00 AM    Last diabetic Foot exam: No results found for: HMDIABFOOTEX   Lab Results  Component Value Date   CHOL 111 09/06/2021   HDL 40 09/06/2021   LDLCALC 49 09/06/2021   TRIG 119 09/06/2021   CHOLHDL 2.8 09/06/2021    Hepatic Function Latest Ref Rng & Units 09/06/2021 03/06/2021 11/16/2020  Total Protein 6.0 - 8.5 g/dL 6.8 6.7 6.9  Albumin 3.8 - 4.8 g/dL 4.1 4.2 4.2  AST 0 - 40 IU/L $Remov'12 10 15  'lMgIYA$ ALT 0 - 32 IU/L $Remov'9 12 10  'Kegyxb$ Alk Phosphatase 44 - 121 IU/L 84 112 100  Total Bilirubin 0.0 - 1.2 mg/dL 0.3 <0.2 0.2  Bilirubin, Direct - - - -    Lab Results  Component Value Date/Time   TSH 0.855 04/13/2020 02:51 PM   TSH 0.451 01/07/2020 11:00 AM   TSH 0.83 04/03/2018 12:00 AM    CBC Latest Ref Rng & Units 09/06/2021 11/16/2020 08/11/2020  WBC 3.4 - 10.8 x10E3/uL 10.9(H) 12.6(H) 13.3(H)  Hemoglobin 11.1 - 15.9 g/dL 12.9 12.1 11.6  Hematocrit 34.0 - 46.6 % 41.8 38.7 38.7  Platelets 150 - 450 x10E3/uL 294 346 327    Lab Results  Component Value Date/Time   VD25OH 26.8 (L) 08/11/2020 11:09 AM   VD25OH 44.2 11/18/2019 02:44 PM    Clinical ASCVD: Yes  The ASCVD Risk score (Arnett DK, et al., 2019) failed to calculate for the following reasons:   The valid total cholesterol range is 130 to 320  mg/dL    Depression screen [facility] 2/9 09/06/2021 09/06/2021 07/11/2021  Decreased Interest 1 0 2  Down, Depressed, Hopeless 1 0 0  PHQ - 2 Score 2 0 2  Altered sleeping $RemoveBeforeDE'1 3 1  'OkXMpFuOwpqRcst$ Tired, decreased energy 1 1 0  Change in appetite 1 3 0  Feeling bad or failure about yourself  0 1 0  Trouble concentrating 0 0 0  Moving slowly or fidgety/restless 0 0 0  Suicidal thoughts 0 0 0  PHQ-9 Score $RemoveBef'5 8 3  'lLUAQMNjIU$ Difficult doing work/chores Not difficult at all - -    Social History   Tobacco Use  Smoking Status Every Day   Packs/day: 1.00   Years: 30.00   Pack years: 30.00   Types: Cigarettes  Smokeless Tobacco Never  Tobacco Comments   started back in 2012   BP Readings from Last 3  Encounters:  09/06/21 130/78  06/07/21 132/78  03/06/21 122/78   Pulse Readings from Last 3 Encounters:  09/06/21 79  06/07/21 78  03/06/21 (!) 56   Wt Readings from Last 3 Encounters:  09/06/21 178 lb (80.7 kg)  06/07/21 183 lb (83 kg)  03/06/21 186 lb 12.8 oz (84.7 kg)   BMI Readings from Last 3 Encounters:  09/06/21 34.08 kg/m  06/07/21 35.04 kg/m  03/06/21 35.76 kg/m    Assessment/Interventions: Review of patient past medical history, allergies, medications, health status, including review of consultants reports, laboratory and other test data, was performed as part of comprehensive evaluation and provision of chronic care management services.   SDOH:  (Social Determinants of Health) assessments and interventions performed: No  SDOH Screenings   Alcohol Screen: Low Risk    Last Alcohol Screening Score (AUDIT): 0  Depression (PHQ2-9): Medium Risk   PHQ-2 Score: 5  Financial Resource Strain: Low Risk    Difficulty of Paying Living Expenses: Not very hard  Food Insecurity: No Food Insecurity   Worried About Charity fundraiser in the Last Year: Never true   Ran Out of Food in the Last Year: Never true  Housing: Low Risk    Last Housing Risk Score: 0  Physical Activity: Inactive   Days of Exercise per Week: 0 days   Minutes of Exercise per Session: 0 min  Social Connections: Moderately Integrated   Frequency of Communication with Friends and Family: More than three times a week   Frequency of Social Gatherings with Friends and Family: More than three times a week   Attends Religious Services: More than 4 times per year   Active Member of [employer] or Organizations: Yes   Attends Music therapist: More than 4 times per year   Marital Status: Divorced  Stress: No Stress Concern Present   Feeling of Stress : Only a little  Tobacco Use: High Risk   Smoking Tobacco Use: Every Day   Smokeless Tobacco Use: Never   Passive Exposure: Not on file   Transportation Needs: Unmet Transportation Needs   Lack of Transportation (Medical): Yes   Lack of Transportation (Non-Medical): Yes    CCM Care Plan  No Known Allergies  Medications Reviewed Today     Reviewed by Mayford Knife, RPH (Pharmacist) on 09/25/21 at [place] List Status: <None>   Medication Order Taking? Sig Documenting Provider Last Dose Status Informant  amLODipine (NORVASC) 5 MG tablet [id number] No TAKE 1 TABLET (5 MG TOTAL) BY MOUTH DAILY. [name], FNP Taking Active   aspirin 81 MG tablet [id number] No  Take 1 tablet (81 mg total) by mouth daily. Take x 1 month post op to decrease risk of blood clots. [name], FNP Taking Active   atorvastatin (LIPITOR) 10 MG tablet [id number] No Take 1 tablet (10 mg total) by mouth daily. [name], FNP Taking Active   dorzolamide-timolol (COSOPT) 22.3-6.8 MG/ML ophthalmic solution [id number] No Place 1 drop into both eyes 2 (two) times daily. [provider] Taking Active   escitalopram (LEXAPRO) 10 MG tablet [id number]  TAKE 1 TABLET (10 MG TOTAL) BY MOUTH DAILY. [name], FNP  Active   ferrous sulfate 325 (65 FE) MG tablet [id number] No Take 1 tablet (325 mg total) by mouth 2 (two) times daily with a meal. Darliss Cheney, MD Taking Expired 07/11/21 2359   glucose blood (ACCU-CHEK AVIVA PLUS) test strip [id number] No Use as instructed [name], FNP Taking Active   hydrocortisone cream 1 % [id number] No Apply to affected area 2 times daily [name], FNP Taking Active   insulin degludec (TRESIBA FLEXTOUCH) 100 UNIT/ML FlexTouch Pen [id number]  Inject 10 Units into the skin at bedtime. [place] Chard, MD  Active   Insulin Pen Needle (BD PEN NEEDLE NANO U/F) 32G X 4 MM MISC [id number] No USE FOR 1 INJECTION DAILY [place]ale Chard, MD Taking Active Self  Insulin Pen Needle (NOVOFINE PLUS PEN NEEDLE) 32G X 4 MM MISC [id number]  Use with Thornton Papas, MD  Active   Lancets (ONETOUCH DELICA PLUS HUDJSH70Y)  [place] [id number] No USE TO CHECK BLOOD SUGAR ONCE A DAY AS INSTRUCTED [name], FNP Taking Active   Semaglutide (RYBELSUS) 7 MG TABS [id number]  Take 7 mg by mouth daily. Take $RemoveBef'7mg'YUErNuzGST$  by mouth daily. Take when you wake with 4 oz of water. Wait 30 mins to eat or drink anything. [place] Chard, MD  Active   valsartan (DIOVAN) 160 MG tablet [id number] No TAKE 1 TABLET (160 MG TOTAL) BY MOUTH DAILY. [name], FNP Taking Active   Vitamin D, Ergocalciferol, (DRISDOL) 1.25 MG (50000 UNIT) CAPS capsule [id number]  Take 1 capsule (50,000 Units total) by mouth every 7 (seven) days. [name], FNP  Active             Patient Active Problem List   Diagnosis Date Noted   BMI 36.0-36.9,adult 01/07/2021   CMV (cytomegalovirus) antibody positive 03/02/2020   Iron deficiency anemia 01/08/2020   Folate deficiency anemia 01/07/2020   Chronic back pain 01/07/2020   Tobacco dependence 01/07/2020   Hypertensive retinopathy of both eyes 02/18/2019   Combined form of age-related cataract, both eyes 02/18/2019   Closed displaced oblique fracture of shaft of left humerus 12/17/2018   Insomnia 06/08/2018   Essential hypertension 06/08/2018   Olecranon bursitis, left elbow 06/19/2017   Closed fracture of distal end of right fibula and tibia 06/10/2016   Diabetes ([place]) 06/10/2016   Traumatic closed displaced fracture of shaft of right tibia and fibula 06/10/2016    Immunization History  Administered Date(s) Administered   Influenza, High Dose Seasonal PF 09/06/2021   Influenza,inj,Quad PF,6+ Mos 07/28/2019, 08/11/2020   PFIZER(Purple Top)SARS-COV-2 Vaccination 11/16/2019, 12/11/2019, 07/30/2020   Tdap 09/06/2021    Conditions to be addressed/monitored:  Hypertension, Hyperlipidemia, and Tobacco use  Care Plan : [place]  Updates made by Mayford Knife, RPH since 09/25/2021 12:00 AM     Problem: HTN, TOBACCO USE, DIABETES   Priority: High     Goal: Disease Management   Recent  Progress: On track  Note:  Current Barriers:  Unable to independently monitor therapeutic efficacy  Pharmacist Clinical Goal(s):  Patient will achieve adherence to monitoring guidelines and medication adherence to achieve therapeutic efficacy through collaboration with PharmD and provider.   Interventions: 1:1 collaboration with [name], FNP regarding development and update of comprehensive plan of care as evidenced by provider attestation and co-signature Inter-disciplinary care team collaboration (see longitudinal plan of care) Comprehensive medication review performed; medication list updated in electronic medical record  Hypertension (BP goal <130/80) -Controlled -Current treatment: Amlodipine 5 mg tablet once per day  Appropriate, Effective, Safe, Accessible Valsartan 160 mg tablet once per day Appropriate, Effective, Safe, Accessible -Current home readings: 136/ 76-78 -Current dietary habits: patient reports that she does not want to eat any fried foods. She is reducing beef. She would like to eat more chicken and fish.  -Current exercise habits: she is not exercising as much  -Denies hypotensive/hypertensive symptoms -Educated on Daily salt intake goal < 2300 mg; Importance of home blood pressure monitoring; -Counseled to monitor BP at home at least twice per week, document, and provide log at future appointments -Recommended to continue current medication  Tobacco use (Goal: reduce smoking) -Uncontrolled -Previous quit attempts: twice -Current treatment  Not currently taking any medication for smoking cessation -Patient smokes Within 30 minutes of waking -Patient triggers include: stress, boredom , and sadness, finishing a meal and drinking coffee, and going to a social event and seeing someone else smoke -On a scale of 1-10, reports MOTIVATION to quit is 8 -On a scale of 1-10, reports CONFIDENCE in quittinusing the lg is 10 -We discussed the different options for  smoking cessation at this time patient would prefer to use the nicotine lozenge only.  -Counseled to allow lozenge to dissolve and absorb in cheek pocket, rather than swallow, to reduce GI side effects. Provided contact information for [place] Quit Line (1-800-QUIT-NOW) and encouraged patient to reach out to this group for support. -Recommended to continue current medication Educated on using one lozenge every1-2 hours maximum of 20 lozenges per day, allow to dissolve slowly 20-30 minutes. Nicotine release may cause a warm tingling sensation   Diabetes (A1c goal <7%) -Uncontrolled -Current medications: Tresiba 100 uni/ml - inject 10 units into the skin at bedtime  Appropriate, Effective, Safe, Accessible Rybelsus 7 mg tablet one per day  Appropriate, Query Effective Patient stopped taking it because it causes her to lose her appetite  -Patient is going to start back on Zultophy once she can afford the medication  -Current home glucose readings fasting glucose: 90-117  -Denies hypoglycemic/hyperglycemic symptoms -Current meal patterns: her appetite is very small since starting Rybelsus drinks: she is drinking more water -Current exercise: will need to follow up  -Educated on Complications of diabetes including kidney damage, retinal damage, and cardiovascular disease; Exercise goal of 150 minutes per week; -Counseled to check feet daily and get yearly eye exams -Counseled the patient on the importance of calling the PCP team to let us know when she purchases the Xultophy so that we can thoroughly discuss changes that need to be made to her regimen, including stop taking the Rybelsus and the [place].  -Will collaborate with the PCP team to change patient back to [place] and discontinue current medication regimen.   -Recommended to continue current medication   Patient Goals/Self-Care Activities Patient will:  - take medications as prescribed as evidenced by patient report and record  review  Follow Up Plan: The patient has been provided with contact information for the  care management team and has been advised to call with any health related questions or concerns. Current Barriers:  Unable to independently monitor therapeutic efficacy      Medication Assistance: Application for XULTOPHY   medication assistance program. in process.  Anticipated assistance start date 10/2021.  See plan of care for additional detail.  Compliance/Adherence/Medication fill history: Care Gaps: COVID-19 Booster - patient received -07/19/2021 Pneumonia Vaccixne Colonoscopy  Ophthalmology  DEXA SCAN  Star-Rating Drugs: Atorvastatin 10 mg tablet  Valsartan 160 mg tablet  Rybelsus 7 mg tablet  Patient's preferred pharmacy is:  [facility], [place] - [address] [place] [address] Phone: [phone] Fax: [phone]  [id number] - [place], [place] [address] [place] [address] Phone: [phone] Fax: [place], [place] [facility] [place] [address] Phone: [phone] Fax: [phone]  [facility] ([place]), [place] - [address] [place]  [address] Phone: [phone] Fax: [phone]  Uses pill box? Yes Pt endorses 85% compliance  We discussed: Benefits of medication synchronization, packaging and delivery as well as enhanced pharmacist oversight with [employer]. Patient decided to: Continue current medication management strategy  Care Plan and Follow Up Patient Decision:  Patient agrees to Care Plan and Follow-up.  Plan: The patient has been provided with contact information for the care management team and has been advised to call with any health related questions or concerns.   Orlando Penner, CPP, PharmD Clinical Pharmacist Practitioner [facility] [phone]

## 2021-09-19 ENCOUNTER — Encounter (INDEPENDENT_AMBULATORY_CARE_PROVIDER_SITE_OTHER): Payer: Medicare PPO | Admitting: Ophthalmology

## 2021-09-19 ENCOUNTER — Encounter (INDEPENDENT_AMBULATORY_CARE_PROVIDER_SITE_OTHER): Payer: Self-pay

## 2021-09-19 ENCOUNTER — Other Ambulatory Visit: Payer: Self-pay

## 2021-09-19 DIAGNOSIS — H35033 Hypertensive retinopathy, bilateral: Secondary | ICD-10-CM | POA: Diagnosis not present

## 2021-09-19 DIAGNOSIS — H43813 Vitreous degeneration, bilateral: Secondary | ICD-10-CM

## 2021-09-19 DIAGNOSIS — I10 Essential (primary) hypertension: Secondary | ICD-10-CM

## 2021-09-19 DIAGNOSIS — E113311 Type 2 diabetes mellitus with moderate nonproliferative diabetic retinopathy with macular edema, right eye: Secondary | ICD-10-CM

## 2021-09-19 DIAGNOSIS — H34812 Central retinal vein occlusion, left eye, with macular edema: Secondary | ICD-10-CM | POA: Diagnosis not present

## 2021-09-19 DIAGNOSIS — E113512 Type 2 diabetes mellitus with proliferative diabetic retinopathy with macular edema, left eye: Secondary | ICD-10-CM

## 2021-09-25 MED ORDER — NICOTINE POLACRILEX 4 MG MT LOZG: 4.0000 mg | LOZENGE | OROMUCOSAL | 6 refills | Status: AC | PRN

## 2021-09-25 NOTE — Patient Instructions (Addendum)
Visit Information It was great speaking with you today!  Please let me know if you have any questions about our visit.   Goals Addressed             This Visit's Progress    Manage My Medicine       Timeframe:  Long-Range Goal Priority:  High Start Date:                             Expected End Date:                       Follow Up Date 11/22/2021   - call for medicine refill 2 or 3 days before it runs out - call if I am sick and can't take my medicine - keep a list of all the medicines I take; vitamins and herbals too - use a pillbox to sort medicine - use an alarm clock or phone to remind me to take my medicine    Why is this important?   These steps will help you keep on track with your medicines.   Notes:  Please call if you have any questions or concerns about your medications.        Problem Identified: HTN, TOBACCO USE, DIABETES   Priority: High     Goal: Disease Management   Recent Progress: On track  Note:   Current Barriers:  Unable to independently monitor therapeutic efficacy  Pharmacist Clinical Goal(s):  Patient will achieve adherence to monitoring guidelines and medication adherence to achieve therapeutic efficacy through collaboration with PharmD and provider.   Interventions: 1:1 collaboration with Arnette Felts, FNP regarding development and update of comprehensive plan of care as evidenced by provider attestation and co-signature Inter-disciplinary care team collaboration (see longitudinal plan of care) Comprehensive medication review performed; medication list updated in electronic medical record  Hypertension (BP goal <130/80) -Controlled -Current treatment: Amlodipine 5 mg tablet once per day  Appropriate, Effective, Safe, Accessible Valsartan 160 mg tablet once per day Appropriate, Effective, Safe, Accessible -Current home readings: 136/ 76-78 -Current dietary habits: patient reports that she does not want to eat any fried foods. She is  reducing beef. She would like to eat more chicken and fish.  -Current exercise habits: she is not exercising as much  -Denies hypotensive/hypertensive symptoms -Educated on Daily salt intake goal < 2300 mg; Importance of home blood pressure monitoring; -Counseled to monitor BP at home at least twice per week, document, and provide log at future appointments -Recommended to continue current medication  Tobacco use (Goal: reduce smoking) -Uncontrolled -Previous quit attempts: twice -Current treatment  Not currently taking any medication for smoking cessation -Patient smokes Within 30 minutes of waking -Patient triggers include: stress, boredom , and sadness, finishing a meal and drinking coffee, and going to a social event and seeing someone else smoke -On a scale of 1-10, reports MOTIVATION to quit is 8 -On a scale of 1-10, reports CONFIDENCE in quittinusing the lg is 10 -We discussed the different options for smoking cessation at this time patient would prefer to use the nicotine lozenge only.  -Counseled to allow lozenge to dissolve and absorb in cheek pocket, rather than swallow, to reduce GI side effects. Provided contact information for [place] Quit Line (1-800-QUIT-NOW) and encouraged patient to reach out to this group for support. -We discussed the importance of cutting back her coffee intake by half and calling with any questions  that she has  -Recommended to continue current medication Educated on using one lozenge every1-2 hours maximum of 20 lozenges per day, allow to dissolve slowly 20-30 minutes. Nicotine release may cause a warm tingling sensation   Diabetes (A1c goal <7%) -Uncontrolled -Current medications: Tresiba 100 uni/ml - inject 10 units into the skin at bedtime  Appropriate, Effective, Safe, Accessible Rybelsus 7 mg tablet one per day  Appropriate, Query Effective Patient stopped taking it because it causes her to lose her appetite  -Patient is going to start back on  Zultophy once she can afford the medication  -Current home glucose readings fasting glucose: 90-117  -Denies hypoglycemic/hyperglycemic symptoms -Current meal patterns: her appetite is very small since starting Rybelsus drinks: she is drinking more water -Current exercise: will need to follow up  -Educated on Complications of diabetes including kidney damage, retinal damage, and cardiovascular disease; Exercise goal of 150 minutes per week; -Counseled to check feet daily and get yearly eye exams -Counseled the patient on the importance of calling the PCP team to let us know when she purchases the Xultophy so that we can thoroughly discuss changes that need to be made to her regimen, including stop taking the Rybelsus and the [place].  -Will collaborate with the PCP team to change patient back to Xultophy and discontinue current medication regimen.   -Recommended to continue current medication   Patient Goals/Self-Care Activities Patient will:  - take medications as prescribed as evidenced by patient report and record review  Follow Up Plan: The patient has been provided with contact information for the care management team and has been advised to call with any health related questions or concerns. Current Barriers:  Unable to independently monitor therapeutic efficacy         Patient agreed to services and verbal consent obtained.   The patient verbalized understanding of instructions, educational materials, and care plan provided today and agreed to receive a mailed copy of patient instructions, educational materials, and care plan.   Cherylin Mylar, PharmD Clinical Pharmacist [facility] [phone]

## 2021-10-10 DIAGNOSIS — E119 Type 2 diabetes mellitus without complications: Secondary | ICD-10-CM | POA: Diagnosis not present

## 2021-10-10 DIAGNOSIS — I10 Essential (primary) hypertension: Secondary | ICD-10-CM | POA: Diagnosis not present

## 2021-10-18 ENCOUNTER — Telehealth: Payer: Self-pay

## 2021-10-18 ENCOUNTER — Telehealth: Payer: Medicare PPO

## 2021-10-18 NOTE — Telephone Encounter (Signed)
°  Care Management   Follow Up Note   10/18/2021 Name: [name] MRN: [id number] DOB: [birth year]   Referred by: Arnette Felts, FNP Reason for referral : Chronic Care Management (RN CM Follow up call )   An unsuccessful telephone outreach was attempted today. The patient was referred to the case management team for assistance with care management and care coordination.   Follow Up Plan: A HIPPA compliant phone message was left for the patient providing contact information and requesting a return call.   Delsa Sale, RN, BSN, CCM Care Management Coordinator [facility] Care Management/[facility]  Direct Phone: [phone]

## 2021-10-19 ENCOUNTER — Other Ambulatory Visit: Payer: Self-pay

## 2021-10-19 ENCOUNTER — Encounter (INDEPENDENT_AMBULATORY_CARE_PROVIDER_SITE_OTHER): Payer: Medicare PPO | Admitting: Ophthalmology

## 2021-10-19 DIAGNOSIS — I10 Essential (primary) hypertension: Secondary | ICD-10-CM | POA: Diagnosis not present

## 2021-10-19 DIAGNOSIS — E113391 Type 2 diabetes mellitus with moderate nonproliferative diabetic retinopathy without macular edema, right eye: Secondary | ICD-10-CM | POA: Diagnosis not present

## 2021-10-19 DIAGNOSIS — H43813 Vitreous degeneration, bilateral: Secondary | ICD-10-CM | POA: Diagnosis not present

## 2021-10-19 DIAGNOSIS — H34812 Central retinal vein occlusion, left eye, with macular edema: Secondary | ICD-10-CM

## 2021-10-19 DIAGNOSIS — E113512 Type 2 diabetes mellitus with proliferative diabetic retinopathy with macular edema, left eye: Secondary | ICD-10-CM | POA: Diagnosis not present

## 2021-10-19 DIAGNOSIS — H35033 Hypertensive retinopathy, bilateral: Secondary | ICD-10-CM

## 2021-10-20 ENCOUNTER — Ambulatory Visit (INDEPENDENT_AMBULATORY_CARE_PROVIDER_SITE_OTHER): Payer: Medicare PPO

## 2021-10-20 DIAGNOSIS — E119 Type 2 diabetes mellitus without complications: Secondary | ICD-10-CM

## 2021-10-20 DIAGNOSIS — I10 Essential (primary) hypertension: Secondary | ICD-10-CM

## 2021-10-20 NOTE — Chronic Care Management (AMB) (Signed)
Chronic Care Management   CCM RN Visit Note  10/20/2021 Name: [name] MRN: [id number] DOB: [birth year]  Subjective: [name] is a 66 y.o. year old female who is a primary care patient of [name], [place]. The care management team was consulted for assistance with disease management and care coordination needs.    Engaged with patient by telephone for follow up visit in response to provider referral for case management and/or care coordination services.   Consent to Services:  The patient was given information about Chronic Care Management services, agreed to services, and gave verbal consent prior to initiation of services.  Please see initial visit note for detailed documentation.   Patient agreed to services and verbal consent obtained.   Assessment: Review of patient past medical history, allergies, medications, health status, including review of consultants reports, laboratory and other test data, was performed as part of comprehensive evaluation and provision of chronic care management services.   SDOH (Social Determinants of Health) assessments and interventions performed:    CCM Care Plan  No Known Allergies  Outpatient Encounter Medications as of 10/20/2021  Medication Sig   XULTOPHY 100-3.6 UNIT-MG/ML SOPN Inject into the skin.   amLODipine (NORVASC) 5 MG tablet TAKE 1 TABLET (5 MG TOTAL) BY MOUTH DAILY.   aspirin 81 MG tablet Take 1 tablet (81 mg total) by mouth daily. Take x 1 month post op to decrease risk of blood clots.   atorvastatin (LIPITOR) 10 MG tablet Take 1 tablet (10 mg total) by mouth daily.   dorzolamide-timolol (COSOPT) 22.3-6.8 MG/ML ophthalmic solution Place 1 drop into both eyes 2 (two) times daily.   escitalopram (LEXAPRO) 10 MG tablet TAKE 1 TABLET (10 MG TOTAL) BY MOUTH DAILY.   ferrous sulfate 325 (65 FE) MG tablet Take 1 tablet (325 mg total) by mouth 2 (two) times daily with a meal.   glucose blood (ACCU-CHEK AVIVA PLUS) test  strip Use as instructed   hydrocortisone cream 1 % Apply to affected area 2 times daily   insulin degludec (TRESIBA FLEXTOUCH) 100 UNIT/ML FlexTouch Pen Inject 10 Units into the skin at bedtime. (Patient not taking: Reported on 10/20/2021)   Insulin Pen Needle (BD PEN NEEDLE NANO U/F) 32G X 4 MM MISC USE FOR 1 INJECTION DAILY   Insulin Pen Needle (NOVOFINE PLUS PEN NEEDLE) 32G X 4 MM MISC Use with tresiba   Lancets (ONETOUCH DELICA PLUS [id number]) MISC USE TO CHECK BLOOD SUGAR ONCE A DAY AS INSTRUCTED   Semaglutide (RYBELSUS) 7 MG TABS Take 7 mg by mouth daily. Take $RemoveBef'7mg'[id number]$  by mouth daily. Take when you wake with 4 oz of water. Wait 30 mins to eat or drink anything.   valsartan (DIOVAN) 160 MG tablet TAKE 1 TABLET (160 MG TOTAL) BY MOUTH DAILY.   Vitamin D, Ergocalciferol, (DRISDOL) 1.25 MG (50000 UNIT) CAPS capsule Take 1 capsule (50,000 Units total) by mouth every 7 (seven) days.   No facility-administered encounter medications on file as of 10/20/2021.    Patient Active Problem List   Diagnosis Date Noted   BMI 36.0-36.9,adult 01/07/2021   CMV (cytomegalovirus) antibody positive 03/02/2020   Iron deficiency anemia 01/08/2020   Folate deficiency anemia 01/07/2020   Chronic back pain 01/07/2020   Tobacco dependence 01/07/2020   Hypertensive retinopathy of both eyes 02/18/2019   Combined form of age-related cataract, both eyes 02/18/2019   Closed displaced oblique fracture of shaft of left humerus 12/17/2018   Insomnia 06/08/2018   Essential hypertension 06/08/2018  Olecranon bursitis, left elbow 06/19/2017   Closed fracture of distal end of right fibula and tibia 06/10/2016   Diabetes ([place]) 06/10/2016   Traumatic closed displaced fracture of shaft of right tibia and fibula 06/10/2016    Conditions to be addressed/monitored:HTN, DMII, and Depression  Care Plan : RN Care Manager Plan of Care  Updates made by Dimitri Ped, RN since 10/20/2021 12:00 AM     Problem: No plan of care  established for management of chronic disease states (Type 2 Diabetes Mellitus, Essential hypertension, Vitamin D deficiency, Depression)   Priority: High     Long-Range Goal: Development of plan of care for chronic disease management (Type 2 Diabetes Mellitus, Essential hypertension, Vitamin D deficiency, Depression)   Start Date: 08/17/2021  Expected End Date: 08/17/2022  Recent Progress: On track  Priority: High  Note:   Current Barriers:  Knowledge Deficits related to plan of care for management of Type 2 Diabetes Mellitus, Essential hypertension, Vitamin D deficiency, Depression   Chronic Disease Management support and education needs related to Type 2 Diabetes Mellitus, Essential hypertension, Vitamin D deficiency, Depression   Financial Restraints Transportation barriers Pt states she is now taking the Xultophy in the morning not the Rybelsus.  States her CBG was 117.  Denies any hypoglycemia.  States she has not been exercising but like to do more.  States she is eating less and her weight is down to 173 now. RNCM Clinical Goal(s):  Patient will verbalize basic understanding of  Type 2 Diabetes Mellitus, Essential hypertension, Vitamin D deficiency, Depression  disease process and self health management plan as evidenced by patient will report having no disease exacerbations related to her chronic disease states take all medications exactly as prescribed and will call provider for medication related questions as evidenced by patient will report having no missed doses of her prescribed medications  demonstrate Ongoing health management independence as evidenced by patient will report 100% adherence to her prescribed treatment plan  continue to work with RN Care Manager to address care management and care coordination needs related to  Type 2 Diabetes Mellitus, Essential hypertension, Vitamin D deficiency, Depression  as evidenced by adherence to CM Team Scheduled appointments demonstrate  ongoing self health care management ability   as evidenced by    through collaboration with RN Care manager, provider, and care team.   Interventions: 1:1 collaboration with primary care provider regarding development and update of comprehensive plan of care as evidenced by provider attestation and co-signature Inter-disciplinary care team collaboration (see longitudinal plan of care) Evaluation of current treatment plan related to  self management and patient's adherence to plan as established by provider   Diabetes Interventions:  (Status:  Goal on track:  Yes.) Long Term Goal Assessed patient's understanding of A1c goal: <6.5% Provided education to patient about basic DM disease process Reviewed medications with patient and discussed importance of medication adherence Counseled on importance of regular laboratory monitoring as prescribed Advised patient, providing education and rationale, to check cbg daily before meals and record, calling PCP and or RN CM for findings outside established parameters Review of patient status, including review of consultants reports, relevant laboratory and other test results, and medications completed Assessed social determinant of health barriers Reviewed to try to get exercise and encouraged to use Silver Sneakers benefit or to do chair exercise or chair yoga on You Tube as tolerated .  Reviewed s/sx of hypoglycemia and actions to take Discussed plans with patient for ongoing care management  follow up and provided patient with direct contact information for care management team Lab Results  Component Value Date   HGBA1C 5.9 (H) 09/06/2021   Depression:  (Status:  Goal on track:  Yes.)  Long Term Goal Evaluation of current treatment plan related to  Depression , self-management and patient's adherence to plan as established by provider Reviewed medications with patient and discussed importance of medication adherence Determined patient continues to have  mild depression, she does not feel Lexapro is effective in relieving her symptoms Reinforced patient to discuss her symptoms with PCP at next visit; educated patient on the importance of not abruptly stopping antidepressant but to work with PCP to help titrate off medication or consider an alternate therapy  Determined patient uses distraction, church and prayer to help her cope and finds this to be somewhat effective, she continues to experience lack of interest in doing the things she enjoys doing, she denies SI  Encouraged patient to continue to stay socially engaged with friends/family to help with depression Educated patient while providing rationale on the benefits of exercise to help with mental health as well as overall physical health  Determined patient is interested in non-medical transportation and applying for Medicaid, SW referral sent via in basket message Determined patient is interested in non-medical transportation and applying for Medicaid, SW referral sent via in basket message- Social worker provided pt information on transportation resources  Discussed plans with patient for ongoing care management follow up and provided patient with direct contact information for care management team  Hypertension Interventions:  (Status:  Goal on track:  Yes.) Long Term Goal Last practice recorded BP readings:  BP Readings from Last 3 Encounters:  06/07/21 132/78  03/06/21 122/78  12/21/20 118/62  Most recent eGFR/CrCl:  Lab Results  Component Value Date   EGFR 58 (L) 06/07/2021    No components found for: CRCL  Evaluation of current treatment plan related to hypertension self management and patient's adherence to plan as established by provider Provided education to patient re: stroke prevention, s/s of heart attack and stroke Reviewed medications with patient and discussed importance of compliance Provided assistance with obtaining home blood pressure monitor via advised to contact  insurance about getting new B/P monitor with over the counter benefit; Counseled on the importance of exercise goals with target of 150 minutes per week Advised patient, providing education and rationale, to monitor blood pressure daily and record, calling PCP for findings outside established parameters Provided education on prescribed diet low Sodium Discussed complications of poorly controlled blood pressure such as heart disease, stroke, circulatory complications, vision complications, kidney impairment, sexual dysfunction Discussed plans with patient for ongoing care management follow up and provided patient with direct contact information for care management team  Patient Goals/Self-Care Activities: Take all medications as prescribed Attend all scheduled provider appointments Call pharmacy for medication refills 3-7 days in advance of running out of medications Perform all self care activities independently  Call provider office for new concerns or questions  Work with the social worker to address care coordination needs and will continue to work with the clinical team to address health care and disease management related needs keep appointment with eye doctor check blood sugar at prescribed times: once daily and when you have symptoms of low or high blood sugar drink 6 to 8 glasses of water each day fill half of plate with vegetables manage portion size check blood pressure weekly call doctor for signs and symptoms of high blood pressure  take medications for blood pressure exactly as prescribed begin an exercise program  Follow Up Plan:  Telephone follow up appointment with care management team member scheduled for:  12/06/21      Plan:Telephone follow up appointment with care management team member scheduled for:  12/06/21 The patient has been provided with contact information for the care management team and has been advised to call with any health related questions or concerns.   Peter Garter RN, Jackquline Denmark, CDE Care Management Coordinator [phone]

## 2021-10-20 NOTE — Patient Instructions (Signed)
Visit Information  Thank you for taking time to visit with me today. Please don't hesitate to contact me if I can be of assistance to you before our next scheduled telephone appointment.  Following are the goals we discussed today:  Take all medications as prescribed Attend all scheduled provider appointments Call pharmacy for medication refills 3-7 days in advance of running out of medications Perform all self care activities independently  Call provider office for new concerns or questions  Work with the social worker to address care coordination needs and will continue to work with the clinical team to address health care and disease management related needs keep appointment with eye doctor check blood sugar at prescribed times: once daily and when you have symptoms of low or high blood sugar drink 6 to 8 glasses of water each day fill half of plate with vegetables manage portion size check blood pressure weekly call doctor for signs and symptoms of high blood pressure take medications for blood pressure exactly as prescribed begin an exercise program  Our next appointment is by telephone on 12/06/21 at 11:15 AM  Please call the care guide team at [phone] if you need to cancel or reschedule your appointment.   If you are experiencing a Mental Health or Behavioral Health Crisis or need someone to talk to, please call the Suicide and Crisis Lifeline: 988 call the [place] National Suicide Prevention Lifeline: 780-382-3578 or TTY: (602) 034-8085 TTY 260 679 7761) to talk to a trained counselor call 1-800-273-TALK (toll free, 24 hour hotline) go to [facility] [address], [place] [phone]) call 911   Patient verbalizes understanding of instructions and care plan provided today and agrees to view in MyChart. Active MyChart status confirmed with patient.    Dudley Major RN, Maximiano Coss, CDE Care Management Coordinator [phone]

## 2021-10-27 ENCOUNTER — Telehealth: Payer: Self-pay

## 2021-10-27 NOTE — Chronic Care Management (AMB) (Signed)
Chronic Care Management Pharmacy Assistant   Name: [name]  MRN: [id number] DOB: 12-13-1955  Reason for Encounter: Disease State/ General  Recent office visits:  None  Recent consult visits:  None  Hospital visits:  None in previous 6 months  Medications: Outpatient Encounter Medications as of 10/27/2021  Medication Sig   amLODipine (NORVASC) 5 MG tablet TAKE 1 TABLET (5 MG TOTAL) BY MOUTH DAILY.   aspirin 81 MG tablet Take 1 tablet (81 mg total) by mouth daily. Take x 1 month post op to decrease risk of blood clots.   atorvastatin (LIPITOR) 10 MG tablet Take 1 tablet (10 mg total) by mouth daily.   dorzolamide-timolol (COSOPT) 22.3-6.8 MG/ML ophthalmic solution Place 1 drop into both eyes 2 (two) times daily.   escitalopram (LEXAPRO) 10 MG tablet TAKE 1 TABLET (10 MG TOTAL) BY MOUTH DAILY.   ferrous sulfate 325 (65 FE) MG tablet Take 1 tablet (325 mg total) by mouth 2 (two) times daily with a meal.   glucose blood (ACCU-CHEK AVIVA PLUS) test strip Use as instructed   hydrocortisone cream 1 % Apply to affected area 2 times daily   insulin degludec (TRESIBA FLEXTOUCH) 100 UNIT/ML FlexTouch Pen Inject 10 Units into the skin at bedtime. (Patient not taking: Reported on 10/20/2021)   Insulin Pen Needle (BD PEN NEEDLE NANO U/F) 32G X 4 MM MISC USE FOR 1 INJECTION DAILY   Insulin Pen Needle (NOVOFINE PLUS PEN NEEDLE) 32G X 4 MM MISC Use with tresiba   Lancets (ONETOUCH DELICA PLUS LANCET33G) MISC USE TO CHECK BLOOD SUGAR ONCE A DAY AS INSTRUCTED   Semaglutide (RYBELSUS) 7 MG TABS Take 7 mg by mouth daily. Take 7mg  by mouth daily. Take when you wake with 4 oz of water. Wait 30 mins to eat or drink anything.   valsartan (DIOVAN) 160 MG tablet TAKE 1 TABLET (160 MG TOTAL) BY MOUTH DAILY.   Vitamin D, Ergocalciferol, (DRISDOL) 1.25 MG (50000 UNIT) CAPS capsule Take 1 capsule (50,000 Units total) by mouth every 7 (seven) days.   XULTOPHY 100-3.6 UNIT-MG/ML SOPN Inject into the  skin.   No facility-administered encounter medications on file as of 10/27/2021.  Recent Relevant Labs: Lab Results  Component Value Date/Time   HGBA1C 5.9 (H) 09/06/2021 10:45 AM   HGBA1C 6.0 (H) 06/07/2021 03:55 PM   MICROALBUR 150 09/06/2021 03:51 PM   MICROALBUR 150 01/12/2020 05:28 PM    Kidney Function Lab Results  Component Value Date/Time   CREATININE 1.13 (H) 09/06/2021 10:45 AM   CREATININE 1.07 (H) 06/07/2021 03:55 PM   GFRNONAA 56 (L) 08/11/2020 11:09 AM   GFRAA 64 08/11/2020 11:09 AM    Current antihyperglycemic regimen:  Tresiba 10 units daily Xultophy 15 units daily   What recent interventions/DTPs have been made to improve glycemic control:  Educated on Complications of diabetes including kidney damage, retinal damage, and cardiovascular disease; Exercise goal of 150 minutes per week; -Counseled to check feet daily and get yearly eye exams  Have there been any recent hospitalizations or ED visits since last visit with CPP? No Patient denies hypoglycemic symptoms  Patient denies hyperglycemic symptoms  How often are you checking your blood sugar? Patient stated every other day  What are your blood sugars ranging?  Fasting: 121, 117, 80 Before meals: None After meals: None Bedtime: None  During the week, how often does your blood glucose drop below 70? Never  Are you checking your feet daily/regularly? Daily  Adherence Review: Is  the patient currently on a STATIN medication? Yes Is the patient currently on ACE/ARB medication? Yes Does the patient have >5 day gap between last estimated fill dates? No  NOTES: Patient reported that she is still using tobacco. Patient spokes about 15 cigarettes daily now. Patient stated she never started lozenge and isn't ready to quit smoking right now. Patient will call [place] Quit line once she is ready.   Care Gaps: Pneumonia vaccine overdue Shingrix overdue Colonoscopy overdue Covid booster overdue Yearly  ophthalmology overdue Yearly foot exam overdue AWV 09-12-2022  Star Rating Drugs: Valsartan 160 mg- Last filled 10-09-2021 30 [facility] Atorvastatin 10 mg- Last filled 10-09-2021 30 [facility] Xultophy 100-3.6 unit- Last filled 10-17-2021 90 [facility]  [facility] CMA Clinical Pharmacist Assistant [phone]

## 2021-11-07 DIAGNOSIS — E119 Type 2 diabetes mellitus without complications: Secondary | ICD-10-CM

## 2021-11-07 DIAGNOSIS — I10 Essential (primary) hypertension: Secondary | ICD-10-CM

## 2021-11-08 ENCOUNTER — Ambulatory Visit: Payer: Medicare PPO | Admitting: Physician Assistant

## 2021-11-13 ENCOUNTER — Telehealth: Payer: Self-pay

## 2021-11-13 NOTE — Chronic Care Management (AMB) (Signed)
? ? ?  Chronic Care Management ?Pharmacy Assistant  ? ?Name: BLAKELEIGH DOMEK  MRN: [id number] DOB: 08/06/1956 ? ? ?Reason for Encounter: Reschedule appointment  ? ? ?Medications: ?Outpatient Encounter Medications as of 11/13/2021  ?Medication Sig  ? amLODipine (NORVASC) 5 MG tablet TAKE 1 TABLET (5 MG TOTAL) BY MOUTH DAILY.  ? aspirin 81 MG tablet Take 1 tablet (81 mg total) by mouth daily. Take x 1 month post op to decrease risk of blood clots.  ? atorvastatin (LIPITOR) 10 MG tablet Take 1 tablet (10 mg total) by mouth daily.  ? dorzolamide-timolol (COSOPT) 22.3-6.8 MG/ML ophthalmic solution Place 1 drop into both eyes 2 (two) times daily.  ? escitalopram (LEXAPRO) 10 MG tablet TAKE 1 TABLET (10 MG TOTAL) BY MOUTH DAILY.  ? ferrous sulfate 325 (65 FE) MG tablet Take 1 tablet (325 mg total) by mouth 2 (two) times daily with a meal.  ? glucose blood (ACCU-CHEK AVIVA PLUS) test strip Use as instructed  ? hydrocortisone cream 1 % Apply to affected area 2 times daily  ? insulin degludec (TRESIBA FLEXTOUCH) 100 UNIT/ML FlexTouch Pen Inject 10 Units into the skin at bedtime. (Patient not taking: Reported on 10/20/2021)  ? Insulin Pen Needle (BD PEN NEEDLE NANO U/F) 32G X 4 MM MISC USE FOR 1 INJECTION DAILY  ? Insulin Pen Needle (NOVOFINE PLUS PEN NEEDLE) 32G X 4 MM MISC Use with tresiba  ? Lancets (ONETOUCH DELICA PLUS LANCET33G) MISC USE TO CHECK BLOOD SUGAR ONCE A DAY AS INSTRUCTED  ? Semaglutide (RYBELSUS) 7 MG TABS Take 7 mg by mouth daily. Take 7mg  by mouth daily. Take when you wake with 4 oz of water. Wait 30 mins to eat or drink anything.  ? valsartan (DIOVAN) 160 MG tablet TAKE 1 TABLET (160 MG TOTAL) BY MOUTH DAILY.  ? Vitamin D, Ergocalciferol, (DRISDOL) 1.25 MG (50000 UNIT) CAPS capsule Take 1 capsule (50,000 Units total) by mouth every 7 (seven) days.  ? XULTOPHY 100-3.6 UNIT-MG/ML SOPN Inject into the skin.  ? ?No facility-administered encounter medications on file as of 11/13/2021.  ? ?11-13-2021: Called patient  to reschedule appointment in March to August per September CPP. Left patient a voicemail appointment is rescheduled and if day and time doesn't work to call. ? ?Cherylin Mylar CMA ?Clinical Pharmacist Assistant ?[phone] ? ?

## 2021-11-16 ENCOUNTER — Other Ambulatory Visit: Payer: Self-pay

## 2021-11-16 ENCOUNTER — Encounter (INDEPENDENT_AMBULATORY_CARE_PROVIDER_SITE_OTHER): Payer: Medicare PPO | Admitting: Ophthalmology

## 2021-11-16 DIAGNOSIS — H35033 Hypertensive retinopathy, bilateral: Secondary | ICD-10-CM

## 2021-11-16 DIAGNOSIS — I10 Essential (primary) hypertension: Secondary | ICD-10-CM

## 2021-11-16 DIAGNOSIS — E113512 Type 2 diabetes mellitus with proliferative diabetic retinopathy with macular edema, left eye: Secondary | ICD-10-CM | POA: Diagnosis not present

## 2021-11-16 DIAGNOSIS — H43813 Vitreous degeneration, bilateral: Secondary | ICD-10-CM | POA: Diagnosis not present

## 2021-11-16 DIAGNOSIS — E113391 Type 2 diabetes mellitus with moderate nonproliferative diabetic retinopathy without macular edema, right eye: Secondary | ICD-10-CM

## 2021-11-16 DIAGNOSIS — H34812 Central retinal vein occlusion, left eye, with macular edema: Secondary | ICD-10-CM | POA: Diagnosis not present

## 2021-11-22 ENCOUNTER — Other Ambulatory Visit: Payer: Self-pay | Admitting: Nurse Practitioner

## 2021-11-22 ENCOUNTER — Telehealth: Payer: Medicare PPO

## 2021-12-04 ENCOUNTER — Ambulatory Visit: Payer: Medicare PPO | Admitting: Nurse Practitioner

## 2021-12-06 ENCOUNTER — Telehealth: Payer: Medicare PPO

## 2021-12-08 ENCOUNTER — Other Ambulatory Visit: Payer: Self-pay | Admitting: Nurse Practitioner

## 2021-12-20 ENCOUNTER — Ambulatory Visit: Payer: Medicare PPO | Admitting: Nurse Practitioner

## 2021-12-22 ENCOUNTER — Telehealth: Payer: Self-pay

## 2021-12-22 NOTE — Chronic Care Management (AMB) (Signed)
? ? ?Chronic Care Management ?Pharmacy Assistant  ? ?Name: TRISA CRANOR  MRN: [id number] DOB: [birth year] ? ? ?Reason for Encounter: Disease State/ General ? ?Recent office visits:  ?None ? ?Recent consult visits:  ?None ? ?Hospital visits:  ?None in previous 6 months ? ?Medications: ?Outpatient Encounter Medications as of 12/22/2021  ?Medication Sig  ? amLODipine (NORVASC) 5 MG tablet TAKE 1 TABLET (5 MG TOTAL) BY MOUTH DAILY.  ? aspirin 81 MG tablet Take 1 tablet (81 mg total) by mouth daily. Take x 1 month post op to decrease risk of blood clots.  ? atorvastatin (LIPITOR) 10 MG tablet Take 1 tablet (10 mg total) by mouth daily.  ? dorzolamide-timolol (COSOPT) 22.3-6.8 MG/ML ophthalmic solution Place 1 drop into both eyes 2 (two) times daily.  ? escitalopram (LEXAPRO) 10 MG tablet TAKE 1 TABLET (10 MG TOTAL) BY MOUTH DAILY.  ? ferrous sulfate 325 (65 FE) MG tablet Take 1 tablet (325 mg total) by mouth 2 (two) times daily with a meal.  ? hydrocortisone cream 1 % Apply to affected area 2 times daily  ? insulin degludec (TRESIBA FLEXTOUCH) 100 UNIT/ML FlexTouch Pen Inject 10 Units into the skin at bedtime. (Patient not taking: Reported on 10/20/2021)  ? Insulin Pen Needle (BD PEN NEEDLE NANO U/F) 32G X 4 MM MISC USE FOR 1 INJECTION DAILY  ? Insulin Pen Needle (NOVOFINE PLUS PEN NEEDLE) 32G X 4 MM MISC Use with tresiba  ? Lancets (ONETOUCH DELICA PLUS LANCET33G) MISC USE TO CHECK BLOOD SUGAR ONCE A DAY AS INSTRUCTED  ? ONETOUCH ULTRA test strip USE TO CHECK BLOOD PRESSURE ONCE A DAY AS INSTRUCTED  ? Semaglutide (RYBELSUS) 7 MG TABS Take 7 mg by mouth daily. Take 7mg  by mouth daily. Take when you wake with 4 oz of water. Wait 30 mins to eat or drink anything.  ? valsartan (DIOVAN) 160 MG tablet TAKE 1 TABLET (160 MG TOTAL) BY MOUTH DAILY.  ? Vitamin D, Ergocalciferol, (DRISDOL) 1.25 MG (50000 UNIT) CAPS capsule Take 1 capsule (50,000 Units total) by mouth every 7 (seven) days.  ? XULTOPHY 100-3.6 UNIT-MG/ML SOPN  Inject into the skin.  ? ?No facility-administered encounter medications on file as of 12/22/2021.  ?Contacted [facility] for general disease state and medication adherence call.  ? ?Patient is not > 5 days past due for refill on the following medications per chart history: ? ?What concerns do you have about your medications? Patient stated no ? ?The patient denies side effects with her medications.  ? ?How often do you forget or accidentally miss a dose? Never ? ?Do you use a pillbox? Yes ? ?Are you having any problems getting your medications from your pharmacy? No ? ?Has the cost of your medications been a concern? No ? ?Since last visit with CPP, no interventions have been made:  ? ?The patient has not had an ED visit since last contact.  ? ?The patient denies problems with their health.  ? ?she denies  concerns or questions for Orlean Bradford, at this time.  ? ?Patient states BP readings are as follows: Patient stated she is getting a new BP machine and she doesn't have any readings. ? ?Fasting: 120, 117 ?Before meals: None ?After meals: None ?Bedtime: None ? ?NOTES: ?Sent refill request to [facility] CMA for test strips. ? ?Care Gaps: ?Pneumonia vaccine overdue ?Shingrix overdue ?Colonoscopy overdue ?Covid booster overdue ?Yearly ophthalmology overdue ?Yearly foot exam overdue ?AWV 09-12-2022 ? ?Star Rating Drugs: ?Valsartan 160 mg-  Last filled 12-08-2021 30 [facility] ?Atorvastatin 10 mg- Last filled 12-08-2021 30 [facility] ?Xultophy 100-3.6 unit- Last filled 10-17-2021 90 DS ? ?Malecca Hicks CMA ?Clinical Pharmacist Assistant ?[phone] ? ?

## 2021-12-25 ENCOUNTER — Other Ambulatory Visit: Payer: Self-pay

## 2021-12-25 MED ORDER — ONETOUCH ULTRA VI STRP: ORAL_STRIP | 2 refills | Status: DC

## 2021-12-26 ENCOUNTER — Encounter: Payer: Medicare PPO | Admitting: Nurse Practitioner

## 2021-12-26 ENCOUNTER — Encounter: Payer: Self-pay | Admitting: Nurse Practitioner

## 2021-12-26 ENCOUNTER — Encounter (INDEPENDENT_AMBULATORY_CARE_PROVIDER_SITE_OTHER): Payer: Medicare PPO | Admitting: Ophthalmology

## 2021-12-26 NOTE — Progress Notes (Signed)
Patient was not seen.

## 2021-12-27 ENCOUNTER — Telehealth: Payer: Medicare PPO

## 2021-12-27 ENCOUNTER — Ambulatory Visit (INDEPENDENT_AMBULATORY_CARE_PROVIDER_SITE_OTHER): Payer: Medicare PPO

## 2021-12-27 DIAGNOSIS — E559 Vitamin D deficiency, unspecified: Secondary | ICD-10-CM

## 2021-12-27 DIAGNOSIS — I10 Essential (primary) hypertension: Secondary | ICD-10-CM

## 2021-12-27 DIAGNOSIS — E119 Type 2 diabetes mellitus without complications: Secondary | ICD-10-CM

## 2021-12-27 DIAGNOSIS — F32A Depression, unspecified: Secondary | ICD-10-CM

## 2021-12-28 NOTE — Patient Instructions (Signed)
Visit Information ? ?Thank you for taking time to visit with me today. Please don't hesitate to contact me if I can be of assistance to you before our next scheduled telephone appointment. ? ?Following are the goals we discussed today:  ?(Copy and paste patient goals from clinical care plan here) ? ?Our next appointment is by telephone on 01/16/22 at 1:00 PM ? ?Please call the care guide team at [phone] if you need to cancel or reschedule your appointment.  ? ?If you are experiencing a Mental Health or Behavioral Health Crisis or need someone to talk to, please call 1-800-273-TALK (toll free, 24 hour hotline)  ? ?Patient verbalizes understanding of instructions and care plan provided today and agrees to view in MyChart. Active MyChart status confirmed with patient.   ? ?Delsa Sale, RN, BSN, CCM ?Care Management Coordinator ?[facility] Care Management/[facility]  ?Direct Phone: [phone] ? ? ?

## 2021-12-28 NOTE — Chronic Care Management (AMB) (Signed)
?Chronic Care Management  ? ?CCM RN Visit Note ? ?12/27/2021 ?Name: [name] MRN: [id number] DOB: 08-25-1956 ? ?Subjective: ?[name] is a 66 y.o. year old female who is a primary care patient of Arnette Felts, FNP. The care management team was consulted for assistance with disease management and care coordination needs.   ? ?Engaged with patient by telephone for follow up visit in response to provider referral for case management and/or care coordination services.  ? ?Consent to Services:  ?The patient was given information about Chronic Care Management services, agreed to services, and gave verbal consent prior to initiation of services.  Please see initial visit note for detailed documentation.  ? ?Patient agreed to services and verbal consent obtained.  ? ?Assessment: Review of patient past medical history, allergies, medications, health status, including review of consultants reports, laboratory and other test data, was performed as part of comprehensive evaluation and provision of chronic care management services.  ? ?SDOH (Social Determinants of Health) assessments and interventions performed:  Yes, see care plan  ? ?CCM Care Plan ? ?No Known Allergies ? ?Outpatient Encounter Medications as of 12/27/2021  ?Medication Sig  ? amLODipine (NORVASC) 5 MG tablet TAKE 1 TABLET (5 MG TOTAL) BY MOUTH DAILY.  ? aspirin 81 MG tablet Take 1 tablet (81 mg total) by mouth daily. Take x 1 month post op to decrease risk of blood clots.  ? atorvastatin (LIPITOR) 10 MG tablet Take 1 tablet (10 mg total) by mouth daily.  ? dorzolamide-timolol (COSOPT) 22.3-6.8 MG/ML ophthalmic solution Place 1 drop into both eyes 2 (two) times daily.  ? escitalopram (LEXAPRO) 10 MG tablet TAKE 1 TABLET (10 MG TOTAL) BY MOUTH DAILY.  ? ferrous sulfate 325 (65 FE) MG tablet Take 1 tablet (325 mg total) by mouth 2 (two) times daily with a meal.  ? glucose blood (ONETOUCH ULTRA) test strip USE TO CHECK BLOOD PRESSURE ONCE A DAY AS  INSTRUCTED  ? hydrocortisone cream 1 % Apply to affected area 2 times daily  ? insulin degludec (TRESIBA FLEXTOUCH) 100 UNIT/ML FlexTouch Pen Inject 10 Units into the skin at bedtime. (Patient not taking: Reported on 10/20/2021)  ? Insulin Pen Needle (BD PEN NEEDLE NANO U/F) 32G X 4 MM MISC USE FOR 1 INJECTION DAILY  ? Insulin Pen Needle (NOVOFINE PLUS PEN NEEDLE) 32G X 4 MM MISC Use with tresiba  ? Lancets (ONETOUCH DELICA PLUS LANCET33G) MISC USE TO CHECK BLOOD SUGAR ONCE A DAY AS INSTRUCTED  ? Semaglutide (RYBELSUS) 7 MG TABS Take 7 mg by mouth daily. Take 7mg  by mouth daily. Take when you wake with 4 oz of water. Wait 30 mins to eat or drink anything.  ? valsartan (DIOVAN) 160 MG tablet TAKE 1 TABLET (160 MG TOTAL) BY MOUTH DAILY.  ? Vitamin D, Ergocalciferol, (DRISDOL) 1.25 MG (50000 UNIT) CAPS capsule Take 1 capsule (50,000 Units total) by mouth every 7 (seven) days.  ? XULTOPHY 100-3.6 UNIT-MG/ML SOPN Inject into the skin.  ? ?No facility-administered encounter medications on file as of 12/27/2021.  ? ? ?Patient Active Problem List  ? Diagnosis Date Noted  ? BMI 36.0-36.9,adult 01/07/2021  ? CMV (cytomegalovirus) antibody positive 03/02/2020  ? Iron deficiency anemia 01/08/2020  ? 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 Chronic back pain 01/07/2020  ? Tobacco dependence 01/07/2020  ? Hypertensive retinopathy of both eyes 02/18/2019  ? Combined form of age-related cataract, both eyes 02/18/2019  ? Closed displaced oblique fracture of shaft of left humerus 12/17/2018  ?  Insomnia 06/08/2018  ? Essential hypertension 06/08/2018  ? Olecranon bursitis, left elbow 06/19/2017  ? Closed fracture of distal end of right fibula and tibia 06/10/2016  ? Diabetes (HCC) 06/10/2016  ? Traumatic closed displaced fracture of shaft of right tibia and fibula 06/10/2016  ? ? ?Conditions to be addressed/monitored: Type 2 Diabetes Mellitus, Essential hypertension, Vitamin D deficiency, Depression  ? ?Care Plan : RN Care Manager Plan of  Care  ?Updates made by [name] ChurchesLittle, Jazzlynn Rawe L, RN since 12/27/2021 12:00 AM  ?  ? ?Problem: No plan of care established for management of chronic disease states (Type 2 Diabetes Mellitus, Essential hypertension, Vitamin D deficiency, Depression)   ?Priority: High  ?  ? ?Long-Range Goal: Development of plan of care for chronic disease management (Type 2 Diabetes Mellitus, Essential hypertension, Vitamin D deficiency, Depression)   ?Start Date: 08/17/2021  ?Expected End Date: 08/17/2022  ?Recent Progress: On track  ?Priority: High  ?Note:   ?Current Barriers:  ?Knowledge Deficits related to plan of care for management of Type 2 Diabetes Mellitus, Essential hypertension, Vitamin D deficiency, Depression   ?Chronic Disease Management support and education needs related to Type 2 Diabetes Mellitus, Essential hypertension, Vitamin D deficiency, Depression   ?Financial Restraints ?Transportation barriers ?Pt states she is now taking the Xultophy in the morning not the Rybelsus.  States her CBG was 117.  Denies any hypoglycemia.  States she has not been exercising but like to do more.  States she is eating less and her weight is down to 173 now. ?RNCM Clinical Goal(s):  ?Patient will verbalize basic understanding of  Type 2 Diabetes Mellitus, Essential hypertension, Vitamin D deficiency, Depression  disease process and self health management plan as evidenced by patient will report having no disease exacerbations related to her chronic disease states ?take all medications exactly as prescribed and will call provider for medication related questions as evidenced by patient will report having no missed doses of her prescribed medications  ?demonstrate Ongoing health management independence as evidenced by patient will report 100% adherence to her prescribed treatment plan  ?continue to work with RN Care Manager to address care management and care coordination needs related to  Type 2 Diabetes Mellitus, Essential hypertension, Vitamin D  deficiency, Depression  as evidenced by adherence to CM Team Scheduled appointments ?demonstrate ongoing self health care management ability   as evidenced by    through collaboration with RN Care manager, provider, and care team.  ? ?Interventions: ?1:1 collaboration with primary care provider regarding development and update of comprehensive plan of care as evidenced by provider attestation and co-signature ?Inter-disciplinary care team collaboration (see longitudinal plan of care) ?Evaluation of current treatment plan related to  self management and patient's adherence to plan as established by provider ? ?Diabetes Interventions:  (Status:  Condition stable.  Not addressed this visit.) Long Term Goal ?Assessed patient's understanding of A1c goal: <6.5% ?Provided education to patient about basic DM disease process ?Reviewed medications with patient and discussed importance of medication adherence ?Counseled on importance of regular laboratory monitoring as prescribed ?Advised patient, providing education and rationale, to check cbg daily before meals and record, calling PCP and or RN CM for findings outside established parameters ?Review of patient status, including review of consultants reports, relevant laboratory and other test results, and medications completed ?Assessed social determinant of health barriers ?Reviewed to try to get exercise and encouraged to use Silver Sneakers benefit or to do chair exercise or chair yoga on [employer] as tolerated .  Reviewed s/sx of hypoglycemia and actions to take ?Discussed plans with patient for ongoing care management follow up and provided patient with direct contact information for care management team ?Lab Results  ?Component Value Date  ? HGBA1C 5.9 (H) 09/06/2021  ? ?Depression:  (Status:  Goal on track:  Yes.)  Long Term Goal ?Evaluation of current treatment plan related to  Depression , self-management and patient's adherence to plan as established by  provider ?Reviewed medications with patient and discussed importance of medication adherence ?Determined patient continues to have mild depression, she feels current dose of Lexapro is effective in relieving her

## 2022-01-01 ENCOUNTER — Encounter: Payer: Self-pay | Admitting: Nurse Practitioner

## 2022-01-01 ENCOUNTER — Ambulatory Visit (INDEPENDENT_AMBULATORY_CARE_PROVIDER_SITE_OTHER): Payer: Medicare PPO | Admitting: Nurse Practitioner

## 2022-01-01 VITALS — BP 130/74 | HR 69 | Temp 98.2°F | Ht 60.6 in | Wt 159.0 lb

## 2022-01-01 DIAGNOSIS — E669 Obesity, unspecified: Secondary | ICD-10-CM | POA: Diagnosis not present

## 2022-01-01 DIAGNOSIS — Z6830 Body mass index (BMI) 30.0-30.9, adult: Secondary | ICD-10-CM

## 2022-01-01 DIAGNOSIS — I2510 Atherosclerotic heart disease of native coronary artery without angina pectoris: Secondary | ICD-10-CM | POA: Diagnosis not present

## 2022-01-01 DIAGNOSIS — E559 Vitamin D deficiency, unspecified: Secondary | ICD-10-CM | POA: Diagnosis not present

## 2022-01-01 DIAGNOSIS — Z720 Tobacco use: Secondary | ICD-10-CM

## 2022-01-01 DIAGNOSIS — I119 Hypertensive heart disease without heart failure: Secondary | ICD-10-CM

## 2022-01-01 DIAGNOSIS — F32A Depression, unspecified: Secondary | ICD-10-CM

## 2022-01-01 DIAGNOSIS — R634 Abnormal weight loss: Secondary | ICD-10-CM

## 2022-01-01 DIAGNOSIS — Z5982 Transportation insecurity: Secondary | ICD-10-CM

## 2022-01-01 DIAGNOSIS — Z23 Encounter for immunization: Secondary | ICD-10-CM | POA: Diagnosis not present

## 2022-01-01 DIAGNOSIS — E1169 Type 2 diabetes mellitus with other specified complication: Secondary | ICD-10-CM | POA: Diagnosis not present

## 2022-01-01 DIAGNOSIS — E6609 Other obesity due to excess calories: Secondary | ICD-10-CM | POA: Diagnosis not present

## 2022-01-01 MED ORDER — ONETOUCH ULTRA VI STRP: ORAL_STRIP | 2 refills | Status: DC

## 2022-01-01 NOTE — Patient Instructions (Addendum)

## 2022-01-01 NOTE — Progress Notes (Signed)
?Clinical biochemist as a Neurosurgeon for [employer], FNP.,have documented all relevant documentation on the behalf of Arnette Felts, FNP,as directed by  Arnette Felts, FNP while in the presence of Arnette Felts, FNP. ? ?This visit occurred during the SARS-CoV-2 public health emergency.  Safety protocols were in place, including screening questions prior to the visit, additional usage of staff PPE, and extensive cleaning of exam room while observing appropriate contact time as indicated for disinfecting solutions. ? ?Subjective:  ?  ? Patient ID: [name] , female    DOB: Jan 04, 1956 , 66 y.o.   MRN: [id number] ? ? ?Chief Complaint  ?Patient presents with  ? Diabetes  ? ? ?HPI ? ?Patient presents today for a diabetes and blood pressure f/u.   ? ?Wt Readings from Last 3 Encounters: ?01/01/22 : 159 lb (72.1 kg) ?09/06/21 : 178 lb (80.7 kg) ?06/07/21 : 183 lb (83 kg) ? ? ? ?Diabetes ?She presents for her follow-up diabetic visit. She has type 2 diabetes mellitus. Her disease course has been worsening. There are no hypoglycemic associated symptoms. Pertinent negatives for hypoglycemia include no dizziness or headaches. Pertinent negatives for diabetes include no blurred vision, no chest pain, no fatigue, no polydipsia, no polyphagia and no polyuria. There are no hypoglycemic complications. Symptoms are improving. There are no diabetic complications. Risk factors for coronary artery disease include sedentary lifestyle and obesity. Current diabetic treatment includes oral agent (dual therapy) (15 units Xultophy daily). She is compliant with treatment most of the time. Her weight is stable. She is following a generally healthy diet. When asked about meal planning, she reported none. She has not had a previous visit with a dietitian. She rarely participates in exercise. (Blood sugar ranging ) An ACE inhibitor/angiotensin II receptor blocker is not being taken. Eye exam is not current (she is having eye treatment with  Dr. Ashley Royalty for cataract, Dr.Groat is her opthalmologist).  ?Hypertension ?This is a chronic problem. The current episode started more than 1 year ago. The problem is uncontrolled. Pertinent negatives include no blurred vision, chest pain, headaches or palpitations. There are no associated agents to hypertension. Risk factors for coronary artery disease include sedentary lifestyle, obesity and diabetes mellitus.   ? ?Past Medical History:  ?Diagnosis Date  ? Anemia 01/07/2020  ? REQUIRING TRANSFUSION  ? Back pain   ? Diabetes mellitus without complication (HCC)   ? GERD (gastroesophageal reflux disease)   ? Hypertension   ? Olecranon bursitis of left elbow   ?  ? ?Family History  ?Problem Relation Age of Onset  ? Diabetes Mother   ? Hypertension Mother   ? Diabetes Father   ? Hypertension Father   ? Breast cancer Sister   ? Breast cancer Sister   ? Breast cancer Paternal Aunt   ? ? ? ?Current Outpatient Medications:  ?  amLODipine (NORVASC) 5 MG tablet, TAKE 1 TABLET (5 MG TOTAL) BY MOUTH DAILY., Disp: 90 tablet, Rfl: 1 ?  aspirin 81 MG tablet, Take 1 tablet (81 mg total) by mouth daily. Take x 1 month post op to decrease risk of blood clots., Disp: 30 tablet, Rfl: 6 ?  atorvastatin (LIPITOR) 10 MG tablet, Take 1 tablet (10 mg total) by mouth daily., Disp: 30 tablet, Rfl: 11 ?  dorzolamide-timolol (COSOPT) 22.3-6.8 MG/ML ophthalmic solution, Place 1 drop into both eyes 2 (two) times daily., Disp: , Rfl:  ?  escitalopram (LEXAPRO) 10 MG tablet, TAKE 1 TABLET (10 MG TOTAL) BY MOUTH DAILY., Disp: 30  tablet, Rfl: 3 ?  ferrous sulfate 325 (65 FE) MG tablet, Take 1 tablet (325 mg total) by mouth 2 (two) times daily with a meal., Disp: 60 tablet, Rfl: 1 ?  Lancets (ONETOUCH DELICA PLUS LANCET33G) MISC, USE TO CHECK BLOOD SUGAR ONCE A DAY AS INSTRUCTED, Disp: 100 each, Rfl: 2 ?  valsartan (DIOVAN) 160 MG tablet, TAKE 1 TABLET (160 MG TOTAL) BY MOUTH DAILY., Disp: 90 tablet, Rfl: 1 ?  Vitamin D, Ergocalciferol, (DRISDOL)  1.25 MG (50000 UNIT) CAPS capsule, Take 1 capsule (50,000 Units total) by mouth every 7 (seven) days., Disp: 12 capsule, Rfl: 0 ?  XULTOPHY 100-3.6 UNIT-MG/ML SOPN, Inject into the skin., Disp: , Rfl:  ?  glucose blood (ONETOUCH ULTRA) test strip, USE TO CHECK BLOOD SUGAR ONCE A DAY AS INSTRUCTED, Disp: 100 each, Rfl: 2  ? ?No Known Allergies  ? ?Review of Systems  ?Constitutional: Negative.  Negative for fatigue.  ?Eyes:  Negative for blurred vision.  ?Respiratory: Negative.    ?Cardiovascular: Negative.  Negative for chest pain and palpitations.  ?Gastrointestinal: Negative.   ?Endocrine: Negative for polydipsia, polyphagia and polyuria.  ?Neurological: Negative.  Negative for dizziness and headaches.  ?Psychiatric/Behavioral: Negative.     ? ?Today's Vitals  ? 01/01/22 1413  ?BP: 130/74  ?Pulse: 69  ?Temp: 98.2 ?F (36.8 ?C)  ?TempSrc: Oral  ?Weight: 159 lb (72.1 kg)  ?Height: 5' 0.6" (1.539 m)  ? ?Body mass index is 30.44 kg/m?.  ?Wt Readings from Last 3 Encounters:  ?01/01/22 159 lb (72.1 kg)  ?09/06/21 178 lb (80.7 kg)  ?06/07/21 183 lb (83 kg)  ? ? ?Objective:  ?Physical Exam ?Vitals reviewed.  ?Constitutional:   ?   General: She is not in acute distress. ?   Appearance: Normal appearance. She is obese.  ?Cardiovascular:  ?   Rate and Rhythm: Normal rate and regular rhythm.  ?   Pulses: Normal pulses.  ?   Heart sounds: Normal heart sounds. No murmur heard. ?Pulmonary:  ?   Effort: Pulmonary effort is normal. No respiratory distress.  ?   Breath sounds: Normal breath sounds. No wheezing.  ?Musculoskeletal:  ?   Cervical back: Normal range of motion and neck supple.  ?   Comments: Using rollator walker and is leaning over it  ?Skin: ?   General: Skin is warm and dry.  ?   Capillary Refill: Capillary refill takes less than 2 seconds.  ?   Findings: No lesion.  ?Neurological:  ?   General: No focal deficit present.  ?   Mental Status: She is alert and oriented to person, place, and time.  ?Psychiatric:     ?    Mood and Affect: Mood normal.     ?   Behavior: Behavior normal.     ?   Thought Content: Thought content normal.     ?   Judgment: Judgment normal.  ?  ? ?   ?Assessment And Plan:  ?   ?1. Diabetes mellitus type 2 in obese [facility](HCC) ?Comments: HgbA1c is improving, continue Xultophy she feels this works better for her vs taking Trulicity and an insulin ?- Hemoglobin A1c ?- TSH ? ?2. Hypertensive heart disease without heart failure ?Comments: Blood pressure is well controlled, continue current medications  ?- CMP14 + Anion Gap ? ?3. Vitamin D deficiency ?Will check vitamin D level and supplement as needed.    ?Also encouraged to spend 15 minutes in the sun daily.  ? ?4. Depression, unspecified  depression type ?Continue current medications, stable.  ?5. Atherosclerotic cardiovascular disease ?Comments: Continue statin, tolerating well. ?- Lipid panel ? ?6. Class 1 obesity due to excess calories without serious comorbidity with body mass index (BMI) of 30.0 to 30.9 in adult ?Chronic ?Discussed healthy diet and regular exercise options  ?Encouraged to exercise at least 150 minutes per week with 2 days of strength training ? ?7. Abnormal weight loss ?Comments: She has lost 26 lbs since September 2022, she has had a decreased appetite and is eating better may be normal however is significant ?- Ambulatory referral to Gastroenterology ? ?8. Encounter for immunization ?- Pneumococcal conjugate vaccine 20-valent (Prevnar 20) ? ?9. Tobacco abuse ?- CT CHEST LUNG CA SCREEN LOW DOSE W/O CM; Future ? ?10. Lack of access to transportation ?Comments: Enrique Sack is to assist her with a SCAT form to see if she qualifies later this week via phone, patient is aware ? ? ? ?Patient was given opportunity to ask questions. Patient verbalized understanding of the plan and was able to repeat key elements of the plan. All questions were answered to their satisfaction.  ?Arnette Felts, FNP  ?Jeanell Sparrow, FNP, have reviewed all documentation for  this visit. The documentation on 01/01/22 for the exam, diagnosis, procedures, and orders are all accurate and complete.  ? ?IF YOU HAVE BEEN REFERRED TO A SPECIALIST, IT MAY TAKE 1-2 WEEKS TO SCHEDULE/PROCE

## 2022-01-02 LAB — LIPID PANEL
Chol/HDL Ratio: 2.2 ratio (ref 0.0–4.4)
Cholesterol, Total: 101 mg/dL (ref 100–199)
HDL: 46 mg/dL (ref >39)
LDL Chol Calc (NIH): 40 mg/dL (ref 0–99)
Triglycerides: 70 mg/dL (ref 0–149)
VLDL Cholesterol Cal: 15 mg/dL (ref 5–40)

## 2022-01-02 LAB — CMP14 + ANION GAP
ALT: 7 IU/L (ref 0–32)
AST: 11 IU/L (ref 0–40)
Albumin/Globulin Ratio: 1.8 (ref 1.2–2.2)
Albumin: 4.2 g/dL (ref 3.8–4.8)
Alkaline Phosphatase: 74 IU/L (ref 44–121)
Anion Gap: 15 mmol/L (ref 10.0–18.0)
BUN/Creatinine Ratio: 21 (ref 12–28)
BUN: 20 mg/dL (ref 8–27)
Bilirubin Total: 0.3 mg/dL (ref 0.0–1.2)
CO2: 22 mmol/L (ref 20–29)
Calcium: 9.7 mg/dL (ref 8.7–10.3)
Chloride: 103 mmol/L (ref 96–106)
Creatinine, Ser: 0.97 mg/dL (ref 0.57–1.00)
Globulin, Total: 2.4 g/dL (ref 1.5–4.5)
Glucose: 64 mg/dL — ABNORMAL LOW (ref 70–99)
Potassium: 4.7 mmol/L (ref 3.5–5.2)
Sodium: 140 mmol/L (ref 134–144)
Total Protein: 6.6 g/dL (ref 6.0–8.5)
eGFR: 65 mL/min/{1.73_m2} (ref >59)

## 2022-01-02 LAB — TSH: TSH: 0.747 u[IU]/mL (ref 0.450–4.500)

## 2022-01-02 LAB — HEMOGLOBIN A1C
Est. average glucose Bld gHb Est-mCnc: 105 mg/dL
Hgb A1c MFr Bld: 5.3 % (ref 4.8–5.6)

## 2022-01-03 ENCOUNTER — Encounter (INDEPENDENT_AMBULATORY_CARE_PROVIDER_SITE_OTHER): Payer: Medicare PPO | Admitting: Ophthalmology

## 2022-01-03 DIAGNOSIS — E113391 Type 2 diabetes mellitus with moderate nonproliferative diabetic retinopathy without macular edema, right eye: Secondary | ICD-10-CM

## 2022-01-03 DIAGNOSIS — H2513 Age-related nuclear cataract, bilateral: Secondary | ICD-10-CM | POA: Diagnosis not present

## 2022-01-03 DIAGNOSIS — H35033 Hypertensive retinopathy, bilateral: Secondary | ICD-10-CM | POA: Diagnosis not present

## 2022-01-03 DIAGNOSIS — H34812 Central retinal vein occlusion, left eye, with macular edema: Secondary | ICD-10-CM

## 2022-01-03 DIAGNOSIS — E113512 Type 2 diabetes mellitus with proliferative diabetic retinopathy with macular edema, left eye: Secondary | ICD-10-CM | POA: Diagnosis not present

## 2022-01-03 DIAGNOSIS — H43813 Vitreous degeneration, bilateral: Secondary | ICD-10-CM

## 2022-01-03 DIAGNOSIS — I10 Essential (primary) hypertension: Secondary | ICD-10-CM

## 2022-01-04 ENCOUNTER — Encounter: Payer: Self-pay | Admitting: Nurse Practitioner

## 2022-01-04 ENCOUNTER — Ambulatory Visit: Payer: Medicare PPO

## 2022-01-04 DIAGNOSIS — I119 Hypertensive heart disease without heart failure: Secondary | ICD-10-CM

## 2022-01-04 DIAGNOSIS — E1169 Type 2 diabetes mellitus with other specified complication: Secondary | ICD-10-CM

## 2022-01-04 NOTE — Patient Instructions (Signed)
Social Worker Visit Information ? ?Goals we discussed today:  ?Patient Goals/Self-Care Activities ?patient will:  ? - work with SW to obtain transportation resources ? ?Materials Provided: Verbal education about transportation resources provided by phone ? ?Patient verbalizes understanding of instructions and care plan provided today and agrees to view in MyChart. Active MyChart status confirmed with patient.   ? ?Follow Up Plan: SW will follow up with patient by phone over the next month ? ?Bevelyn Ngo, BSW, CDP ?Social Worker, Certified Dementia Practitioner ?[facility] / [facility] Care Management ?[phone] ? ?   ? ?

## 2022-01-04 NOTE — Chronic Care Management (AMB) (Signed)
?Chronic Care Management  ? ? Social Work Note ? ?01/04/2022 ?Name: KAMORAH NEVILS MRN: [id number] DOB: [birth year] ? ?DAMIKA HARMON is a 66 y.o. year old female who is a primary care patient of Arnette Felts, FNP. The CCM team was consulted to assist the patient with chronic disease management and/or care coordination needs related to:  DM II, HTN .  ? ?Engaged with patient by telephone for follow up visit in response to provider referral for social work chronic care management and care coordination services.  ? ?Consent to Services:  ?The patient was given information about Chronic Care Management services, agreed to services, and gave verbal consent prior to initiation of services.  Please see initial visit note for detailed documentation.  ? ?Patient agreed to services and consent obtained.  ? ?Assessment: Review of patient past medical history, allergies, medications, and health status, including review of relevant consultants reports was performed today as part of a comprehensive evaluation and provision of chronic care management and care coordination services.    ? ?SDOH (Social Determinants of Health) assessments and interventions performed:  ?SDOH Interventions   ? ?Flowsheet Row Most Recent Value  ?SDOH Interventions   ?Transportation Interventions SCAT Psychologist, clinical)  ? ?  ?  ? ?Advanced Directives Status: Not addressed in this encounter. ? ?CCM Care Plan ? ?No Known Allergies ? ?Outpatient Encounter Medications as of 01/04/2022  ?Medication Sig  ? amLODipine (NORVASC) 5 MG tablet TAKE 1 TABLET (5 MG TOTAL) BY MOUTH DAILY.  ? aspirin 81 MG tablet Take 1 tablet (81 mg total) by mouth daily. Take x 1 month post op to decrease risk of blood clots.  ? atorvastatin (LIPITOR) 10 MG tablet Take 1 tablet (10 mg total) by mouth daily.  ? dorzolamide-timolol (COSOPT) 22.3-6.8 MG/ML ophthalmic solution Place 1 drop into both eyes 2 (two) times daily.  ? escitalopram (LEXAPRO) 10 MG  tablet TAKE 1 TABLET (10 MG TOTAL) BY MOUTH DAILY.  ? ferrous sulfate 325 (65 FE) MG tablet Take 1 tablet (325 mg total) by mouth 2 (two) times daily with a meal.  ? glucose blood (ONETOUCH ULTRA) test strip USE TO CHECK BLOOD SUGAR ONCE A DAY AS INSTRUCTED  ? Lancets (ONETOUCH DELICA PLUS LANCET33G) MISC USE TO CHECK BLOOD SUGAR ONCE A DAY AS INSTRUCTED  ? valsartan (DIOVAN) 160 MG tablet TAKE 1 TABLET (160 MG TOTAL) BY MOUTH DAILY.  ? Vitamin D, Ergocalciferol, (DRISDOL) 1.25 MG (50000 UNIT) CAPS capsule Take 1 capsule (50,000 Units total) by mouth every 7 (seven) days.  ? XULTOPHY 100-3.6 UNIT-MG/ML SOPN Inject into the skin.  ? ?No facility-administered encounter medications on file as of 01/04/2022.  ? ? ?Patient Active Problem List  ? Diagnosis Date Noted  ? BMI 36.0-36.9,adult 01/07/2021  ? CMV (cytomegalovirus) antibody positive 03/02/2020  ? Iron deficiency anemia 01/08/2020  ? 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 Chronic back pain 01/07/2020  ? Tobacco dependence 01/07/2020  ? Hypertensive retinopathy of both eyes 02/18/2019  ? Combined form of age-related cataract, both eyes 02/18/2019  ? Insomnia 06/08/2018  ? Essential hypertension 06/08/2018  ? Diabetes (HCC) 06/10/2016  ? Traumatic closed displaced fracture of shaft of right tibia and fibula 06/10/2016  ? ? ?Conditions to be addressed/monitored: HTN and DMII; Transportation ? ?Care Plan : Social Work Plan of Care  ?Updates made by Bevelyn Ngo since 01/04/2022 12:00 AM  ?  ? ?Problem: Barriers to Treatment   ?  ? ?Goal: Identify Transportation Resources   ?  Start Date: 01/04/2022  ?Priority: High  ?Note:   ?Current Barriers:  ?Chronic disease management support and education needs related to HTN and DM  ?Transportation ? ?Social Worker Clinical Goal(s):  ?patient will work with SW to identify and address any acute and/or chronic care coordination needs related to the self health management of HTN and DM  ?explore community resource options for unmet  needs related to:  Transportation ?SW Interventions:  ?Inter-disciplinary care team collaboration (see longitudinal plan of care) ?Collaboration with Arnette Felts, FNP regarding development and update of comprehensive plan of care as evidenced by provider attestation and co-signature ?Collaboration with primary care team who requests SW intervention to assist patient with transportation resources ?Telephonic visit completed with the patient who indicates she is interested in applying for SCAT ?Completed Part A with the patient via phone ?Advised the patient SW would send Part B to her provider for completion with plans for SW to submit application to SCAT once Part B is completed - patient stated understanding ?Collaboration with Arnette Felts FNP to advise of plan for SW to send Part B for completion ? ?Patient Goals/Self-Care Activities ?patient will:  ? -  work with SW to obtain transportation resources ? ?Follow Up Plan:  SW will follow up with the patient to advise once her SCAT application has been submitted ? ?  ?  ? ?Follow Up Plan: SW will follow up with patient by phone over the next month ?     ?Bevelyn Ngo, BSW, CDP ?Social Worker, Certified Dementia Practitioner ?[facility] / [facility] Care Management ?[phone] ? ?   ? ? ? ? ?

## 2022-01-07 DIAGNOSIS — I10 Essential (primary) hypertension: Secondary | ICD-10-CM

## 2022-01-07 DIAGNOSIS — I119 Hypertensive heart disease without heart failure: Secondary | ICD-10-CM

## 2022-01-07 DIAGNOSIS — E669 Obesity, unspecified: Secondary | ICD-10-CM

## 2022-01-07 DIAGNOSIS — E119 Type 2 diabetes mellitus without complications: Secondary | ICD-10-CM

## 2022-01-07 DIAGNOSIS — E1169 Type 2 diabetes mellitus with other specified complication: Secondary | ICD-10-CM

## 2022-01-07 DIAGNOSIS — F32A Depression, unspecified: Secondary | ICD-10-CM

## 2022-01-11 ENCOUNTER — Other Ambulatory Visit: Payer: Self-pay | Admitting: Nurse Practitioner

## 2022-01-11 ENCOUNTER — Telehealth: Payer: Medicare PPO

## 2022-01-11 ENCOUNTER — Ambulatory Visit (INDEPENDENT_AMBULATORY_CARE_PROVIDER_SITE_OTHER): Payer: Medicare PPO

## 2022-01-11 DIAGNOSIS — E669 Obesity, unspecified: Secondary | ICD-10-CM

## 2022-01-11 DIAGNOSIS — I119 Hypertensive heart disease without heart failure: Secondary | ICD-10-CM

## 2022-01-11 DIAGNOSIS — I10 Essential (primary) hypertension: Secondary | ICD-10-CM

## 2022-01-11 DIAGNOSIS — F3289 Other specified depressive episodes: Secondary | ICD-10-CM

## 2022-01-11 NOTE — Chronic Care Management (AMB) (Signed)
?Chronic Care Management  ? ? Social Work Note ? ?01/11/2022 ?Name: [name] MRN: [id number] DOB: September 24, 1955 ? ?[name] is a 66 y.o. year old female who is a primary care patient of Arnette Felts, FNP. The CCM team was consulted to assist the patient with chronic disease management and/or care coordination needs related to:  DM II, HTN .  ? ?Collaboration with [facility]  for  care coordination  in response to provider referral for social work chronic care management and care coordination services.  ? ?Consent to Services:  ?The patient was given information about Chronic Care Management services, agreed to services, and gave verbal consent prior to initiation of services.  Please see initial visit note for detailed documentation.  ? ?Patient agreed to services and consent obtained.  ? ?Assessment: Review of patient past medical history, allergies, medications, and health status, including review of relevant consultants reports was performed today as part of a comprehensive evaluation and provision of chronic care management and care coordination services.    ? ?SDOH (Social Determinants of Health) assessments and interventions performed:   ? ?Advanced Directives Status: Not addressed in this encounter. ? ?CCM Care Plan ? ?No Known Allergies ? ?Outpatient Encounter Medications as of 01/11/2022  ?Medication Sig  ? amLODipine (NORVASC) 5 MG tablet TAKE 1 TABLET (5 MG TOTAL) BY MOUTH DAILY.  ? aspirin 81 MG tablet Take 1 tablet (81 mg total) by mouth daily. Take x 1 month post op to decrease risk of blood clots.  ? atorvastatin (LIPITOR) 10 MG tablet Take 1 tablet (10 mg total) by mouth daily.  ? dorzolamide-timolol (COSOPT) 22.3-6.8 MG/ML ophthalmic solution Place 1 drop into both eyes 2 (two) times daily.  ? escitalopram (LEXAPRO) 10 MG tablet TAKE 1 TABLET (10 MG TOTAL) BY MOUTH DAILY.  ? ferrous sulfate 325 (65 FE) MG tablet Take 1 tablet (325 mg total) by mouth 2 (two) times daily with a  meal.  ? glucose blood (ONETOUCH ULTRA) test strip USE TO CHECK BLOOD SUGAR ONCE A DAY AS INSTRUCTED  ? Lancets (ONETOUCH DELICA PLUS LANCET33G) MISC USE TO CHECK BLOOD SUGAR ONCE A DAY AS INSTRUCTED  ? valsartan (DIOVAN) 160 MG tablet TAKE 1 TABLET (160 MG TOTAL) BY MOUTH DAILY.  ? Vitamin D, Ergocalciferol, (DRISDOL) 1.25 MG (50000 UNIT) CAPS capsule Take 1 capsule (50,000 Units total) by mouth every 7 (seven) days.  ? XULTOPHY 100-3.6 UNIT-MG/ML SOPN Inject into the skin.  ? ?No facility-administered encounter medications on file as of 01/11/2022.  ? ? ?Patient Active Problem List  ? Diagnosis Date Noted  ? BMI 36.0-36.9,adult 01/07/2021  ? CMV (cytomegalovirus) antibody positive 03/02/2020  ? Iron deficiency anemia 01/08/2020  ? 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 Chronic back pain 01/07/2020  ? Tobacco dependence 01/07/2020  ? Hypertensive retinopathy of both eyes 02/18/2019  ? Combined form of age-related cataract, both eyes 02/18/2019  ? Insomnia 06/08/2018  ? Essential hypertension 06/08/2018  ? Diabetes (HCC) 06/10/2016  ? Traumatic closed displaced fracture of shaft of right tibia and fibula 06/10/2016  ? ? ?Conditions to be addressed/monitored: HTN and DMII; Transportation ? ?Care Plan : Social Work Plan of Care  ?Updates made by Bevelyn Ngo since 01/11/2022 12:00 AM  ?  ? ?Problem: Barriers to Treatment   ?  ? ?Goal: Identify Transportation Resources   ?Start Date: 01/04/2022  ?This Visit's Progress: On track  ?Priority: High  ?Note:   ?Current Barriers:  ?Chronic disease management support and education  needs related to HTN and DM  ?Transportation ? ?Social Worker Clinical Goal(s):  ?patient will work with SW to identify and address any acute and/or chronic care coordination needs related to the self health management of HTN and DM  ?explore community resource options for unmet needs related to:  Transportation ?SW Interventions:  ?Inter-disciplinary care team collaboration (see longitudinal plan of  care) ?Collaboration with Arnette Felts, FNP regarding development and update of comprehensive plan of care as evidenced by provider attestation and co-signature ?Received Part B completed form from primary care provider ?Submitted Part A and B to [employer] with [employer] ? ?Patient Goals/Self-Care Activities ?patient will:  ? -  work with SW to obtain transportation resources ? ?Follow Up Plan:  SW will follow up with the patient over the next week to advise application has been submitted ? ?  ?  ? ?Follow Up Plan: SW will follow up with patient by phone over the next week. ?     ?Bevelyn Ngo, BSW, CDP ?Social Worker, Certified Dementia Practitioner ?[facility] / [facility] Care Management ?[phone] ? ?   ? ? ? ? ?

## 2022-01-16 ENCOUNTER — Other Ambulatory Visit: Payer: Self-pay | Admitting: Nurse Practitioner

## 2022-01-16 ENCOUNTER — Telehealth: Payer: Medicare PPO

## 2022-01-16 ENCOUNTER — Ambulatory Visit: Payer: Self-pay

## 2022-01-16 DIAGNOSIS — I10 Essential (primary) hypertension: Secondary | ICD-10-CM

## 2022-01-16 DIAGNOSIS — E559 Vitamin D deficiency, unspecified: Secondary | ICD-10-CM

## 2022-01-16 DIAGNOSIS — F3289 Other specified depressive episodes: Secondary | ICD-10-CM

## 2022-01-16 DIAGNOSIS — F32A Depression, unspecified: Secondary | ICD-10-CM

## 2022-01-16 DIAGNOSIS — E669 Obesity, unspecified: Secondary | ICD-10-CM

## 2022-01-16 DIAGNOSIS — Z720 Tobacco use: Secondary | ICD-10-CM

## 2022-01-16 MED ORDER — XULTOPHY 100-3.6 UNIT-MG/ML ~~LOC~~ SOPN: 10.0000 [IU] | PEN_INJECTOR | Freq: Every day | SUBCUTANEOUS | 0 refills | Status: DC

## 2022-01-16 MED ORDER — NICOTINE 21 MG/24HR TD PT24: 21.0000 mg | MEDICATED_PATCH | TRANSDERMAL | 1 refills | Status: DC

## 2022-01-16 NOTE — Progress Notes (Signed)
Called patient to confirm if she requested a continuous glucometer from [facility] and she has not requested this equipment, I will decline the order request. Also given results of labs, she is to decrease her Xultophy to 10 units daily until next visit if she is stable will change to Ozempic.  ?

## 2022-01-17 ENCOUNTER — Ambulatory Visit: Payer: Medicare PPO

## 2022-01-17 DIAGNOSIS — I10 Essential (primary) hypertension: Secondary | ICD-10-CM

## 2022-01-17 DIAGNOSIS — E669 Obesity, unspecified: Secondary | ICD-10-CM

## 2022-01-17 NOTE — Patient Instructions (Signed)
Visit Information ? ?Thank you for taking time to visit with me today. Please don't hesitate to contact me if I can be of assistance to you before our next scheduled telephone appointment. ? ?Following are the goals we discussed today:  ?(Copy and paste patient goals from clinical care plan here) ? ?Our next appointment is by telephone on 02/13/22 at 12 PM ? ?Please call the care guide team at [phone] if you need to cancel or reschedule your appointment.  ? ?If you are experiencing a Mental Health or [place] or need someone to talk to, please call 1-800-273-TALK (toll free, 24 hour hotline)  ? ?Patient verbalizes understanding of instructions and care plan provided today and agrees to view in [place]. Active MyChart status confirmed with patient.   ? ?Barb Merino, RN, BSN, CCM ?Care Management Coordinator ?[place] [facility]  ?Direct Phone: [phone] ? ? ?

## 2022-01-17 NOTE — Chronic Care Management (AMB) (Signed)
?Chronic Care Management  ? ? Social Work Note ? ?01/17/2022 ?Name: [name] MRN: [id number] DOB: 12-Nov-1955 ? ?[name] is a 66 y.o. year old female who is a primary care patient of Kristin Felts, FNP. The CCM team was consulted to assist the patient with chronic disease management and/or care coordination needs related to:  DM II, HTN, Transportation .  ? ?Engaged with patient by telephone for follow up visit in response to provider referral for social work chronic care management and care coordination services.  ? ?Consent to Services:  ?The patient was given information about Chronic Care Management services, agreed to services, and gave verbal consent prior to initiation of services.  Please see initial visit note for detailed documentation.  ? ?Patient agreed to services and consent obtained.  ? ?Assessment: Review of patient past medical history, allergies, medications, and health status, including review of relevant consultants reports was performed today as part of a comprehensive evaluation and provision of chronic care management and care coordination services.    ? ?SDOH (Social Determinants of Health) assessments and interventions performed:   ? ?Advanced Directives Status: Not addressed in this encounter. ? ?CCM Care Plan ? ?No Known Allergies ? ?Outpatient Encounter Medications as of 01/17/2022  ?Medication Sig  ? amLODipine (NORVASC) 5 MG tablet TAKE 1 TABLET (5 MG TOTAL) BY MOUTH DAILY.  ? aspirin 81 MG tablet Take 1 tablet (81 mg total) by mouth daily. Take x 1 month post op to decrease risk of blood clots.  ? atorvastatin (LIPITOR) 10 MG tablet Take 1 tablet (10 mg total) by mouth daily.  ? dorzolamide-timolol (COSOPT) 22.3-6.8 MG/ML ophthalmic solution Place 1 drop into both eyes 2 (two) times daily.  ? escitalopram (LEXAPRO) 10 MG tablet TAKE 1 TABLET (10 MG TOTAL) BY MOUTH DAILY.  ? ferrous sulfate 325 (65 FE) MG tablet Take 1 tablet (325 mg total) by mouth 2 (two) times daily  with a meal.  ? glucose blood (ONETOUCH ULTRA) test strip USE TO CHECK BLOOD SUGAR ONCE A DAY AS INSTRUCTED  ? Lancets (ONETOUCH DELICA PLUS LANCET33G) MISC USE TO CHECK BLOOD SUGAR ONCE A DAY AS INSTRUCTED  ? nicotine (NICODERM CQ - DOSED IN MG/24 HOURS) 21 mg/24hr patch Place 1 patch (21 mg total) onto the skin daily.  ? valsartan (DIOVAN) 160 MG tablet TAKE 1 TABLET (160 MG TOTAL) BY MOUTH DAILY.  ? Vitamin D, Ergocalciferol, (DRISDOL) 1.25 MG (50000 UNIT) CAPS capsule Take 1 capsule (50,000 Units total) by mouth every 7 (seven) days.  ? XULTOPHY 100-3.6 UNIT-MG/ML SOPN Inject 10 Units into the skin daily.  ? ?No facility-administered encounter medications on file as of 01/17/2022.  ? ? ?Patient Active Problem List  ? Diagnosis Date Noted  ? BMI 36.0-36.9,adult 01/07/2021  ? CMV (cytomegalovirus) antibody positive 03/02/2020  ? Iron deficiency anemia 01/08/2020  ? 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 Chronic back pain 01/07/2020  ? Tobacco dependence 01/07/2020  ? Hypertensive retinopathy of both eyes 02/18/2019  ? Combined form of age-related cataract, both eyes 02/18/2019  ? Insomnia 06/08/2018  ? Essential hypertension 06/08/2018  ? Diabetes (HCC) 06/10/2016  ? Traumatic closed displaced fracture of shaft of right tibia and fibula 06/10/2016  ? ? ?Conditions to be addressed/monitored: HTN and DMII; Transportation ? ?Care Plan : Social Work Plan of Care  ?Updates made by Bevelyn Ngo since 01/17/2022 12:00 AM  ?  ? ?Problem: Barriers to Treatment   ?  ? ?Goal: Identify Transportation Resources   ?  Start Date: 01/04/2022  ?This Visit's Progress: On track  ?Recent Progress: On track  ?Priority: High  ?Note:   ?Current Barriers:  ?Chronic disease management support and education needs related to HTN and DM  ?Transportation ? ?Social Worker Clinical Goal(s):  ?patient will work with SW to identify and address any acute and/or chronic care coordination needs related to the self health management of HTN and DM   ?explore community resource options for unmet needs related to:  Transportation ?SW Interventions:  ?Inter-disciplinary care team collaboration (see longitudinal plan of care) ?Collaboration with Kristin Felts, FNP regarding development and update of comprehensive plan of care as evidenced by provider attestation and co-signature ?Successful outbound call placed to the patient to discuss goal progression ?Advised the patient SW has submitted her access to ride with [facility] ?Determined the patient has an appointment on 5/17 and is hoping to utilize [facility] for transportation ?Discussed plan for SW to follow up with contact at [facility] to assess application status ?SW will follow up with the patient on Monday 5/15 to assess application status ?Correspondence sent to [name] to assess application status ? ?Patient Goals/Self-Care Activities ?patient will:  ? -  work with SW to obtain transportation resources ? ?Follow Up Plan:  SW will follow up with the patient over the next 7 days ? ?  ?  ? ?Follow Up Plan: SW will follow up with patient by phone over the next week. ?     ?Bevelyn Ngo, BSW, CDP ?Social Worker, Certified Dementia Practitioner ?[facility] / [facility] Care Management ?[phone] ? ?   ? ? ? ? ?

## 2022-01-17 NOTE — Chronic Care Management (AMB) (Signed)
?Chronic Care Management  ? ?CCM RN Visit Note ? ?01/16/2022 ?Name: [name] MRN: [id number] DOB: 04-03-1956 ? ?Subjective: ?[name] is a 66 y.o. year old female who is a primary care patient of [name], [place]. The care management team was consulted for assistance with disease management and care coordination needs.   ? ?Engaged with patient by telephone for follow up visit in response to provider referral for case management and/or care coordination services.  ? ?Consent to Services:  ?The patient was given information about Chronic Care Management services, agreed to services, and gave verbal consent prior to initiation of services.  Please see initial visit note for detailed documentation.  ? ?Patient agreed to services and verbal consent obtained.  ? ?Assessment: Review of patient past medical history, allergies, medications, health status, including review of consultants reports, laboratory and other test data, was performed as part of comprehensive evaluation and provision of chronic care management services.  ? ?SDOH (Social Determinants of Health) assessments and interventions performed:  Yes, no acute challenges  ? ?CCM Care Plan ? ?No Known Allergies ? ?Outpatient Encounter Medications as of 01/16/2022  ?Medication Sig  ? amLODipine (NORVASC) 5 MG tablet TAKE 1 TABLET (5 MG TOTAL) BY MOUTH DAILY.  ? aspirin 81 MG tablet Take 1 tablet (81 mg total) by mouth daily. Take x 1 month post op to decrease risk of blood clots.  ? atorvastatin (LIPITOR) 10 MG tablet Take 1 tablet (10 mg total) by mouth daily.  ? dorzolamide-timolol (COSOPT) 22.3-6.8 MG/ML ophthalmic solution Place 1 drop into both eyes 2 (two) times daily.  ? escitalopram (LEXAPRO) 10 MG tablet TAKE 1 TABLET (10 MG TOTAL) BY MOUTH DAILY.  ? ferrous sulfate 325 (65 FE) MG tablet Take 1 tablet (325 mg total) by mouth 2 (two) times daily with a meal.  ? glucose blood (ONETOUCH ULTRA) test strip USE TO CHECK BLOOD SUGAR ONCE A DAY AS  INSTRUCTED  ? Lancets (ONETOUCH DELICA PLUS [id number]) MISC USE TO CHECK BLOOD SUGAR ONCE A DAY AS INSTRUCTED  ? valsartan (DIOVAN) 160 MG tablet TAKE 1 TABLET (160 MG TOTAL) BY MOUTH DAILY.  ? Vitamin D, Ergocalciferol, (DRISDOL) 1.25 MG (50000 UNIT) CAPS capsule Take 1 capsule (50,000 Units total) by mouth every 7 (seven) days.  ? [DISCONTINUED] XULTOPHY 100-3.6 UNIT-MG/ML SOPN Inject into the skin.  ? ?No facility-administered encounter medications on file as of 01/16/2022.  ? ? ?Patient Active Problem List  ? Diagnosis Date Noted  ? BMI 36.0-36.9,adult 01/07/2021  ? CMV (cytomegalovirus) antibody positive 03/02/2020  ? Iron deficiency anemia 01/08/2020  ? 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 Chronic back pain 01/07/2020  ? Tobacco dependence 01/07/2020  ? Hypertensive retinopathy of both eyes 02/18/2019  ? Combined form of age-related cataract, both eyes 02/18/2019  ? Insomnia 06/08/2018  ? Essential hypertension 06/08/2018  ? Diabetes ([place]) 06/10/2016  ? Traumatic closed displaced fracture of shaft of right tibia and fibula 06/10/2016  ? ? ?Conditions to be addressed/monitored: Type 2 Diabetes Mellitus, Essential hypertension, Vitamin D deficiency, Depression, Atherosclerotic Cardiovascular disease, Tobacco abuse   ? ?Care Plan : RN Care Manager Plan of Care  ?Updates made by Lynne Logan, RN since 01/16/2022 12:00 AM  ?  ? ?Problem: No plan of care established for management of chronic disease states (Type 2 Diabetes Mellitus, Essential hypertension, Vitamin D deficiency, Depression)   ?Priority: High  ?  ? ?Long-Range Goal: Development of plan of care for chronic disease management (Type  2 Diabetes Mellitus, Essential hypertension, Vitamin D deficiency, Depression)   ?Start Date: 08/17/2021  ?Expected End Date: 08/17/2022  ?Recent Progress: On track  ?Priority: High  ?Note:   ?Current Barriers:  ?Knowledge Deficits related to plan of care for management of Type 2 Diabetes Mellitus, Essential hypertension,  Vitamin D deficiency, Depression   ?Chronic Disease Management support and education needs related to Type 2 Diabetes Mellitus, Essential hypertension, Vitamin D deficiency, Depression   ?Financial Restraints ?Transportation barriers ?Pt states she is now taking the Xultophy in the morning not the Rybelsus.  States her CBG was 117.  Denies any hypoglycemia.  States she has not been exercising but like to do more.  States she is eating less and her weight is down to 173 now. ?RNCM Clinical Goal(s):  ?Patient will verbalize basic understanding of  Type 2 Diabetes Mellitus, Essential hypertension, Vitamin D deficiency, Depression  disease process and self health management plan as evidenced by patient will report having no disease exacerbations related to her chronic disease states ?take all medications exactly as prescribed and will call provider for medication related questions as evidenced by patient will report having no missed doses of her prescribed medications  ?demonstrate Ongoing health management independence as evidenced by patient will report 100% adherence to her prescribed treatment plan  ?continue to work with RN Care Manager to address care management and care coordination needs related to  Type 2 Diabetes Mellitus, Essential hypertension, Vitamin D deficiency, Depression  as evidenced by adherence to CM Team Scheduled appointments ?demonstrate ongoing self health care management ability   as evidenced by    through collaboration with RN Care manager, provider, and care team.  ? ?Interventions: ?1:1 collaboration with primary care provider regarding development and update of comprehensive plan of care as evidenced by provider attestation and co-signature ?Inter-disciplinary care team collaboration (see longitudinal plan of care) ?Evaluation of current treatment plan related to  self management and patient's adherence to plan as established by provider ? ?Diabetes Interventions:  (Status:  Goal Met.)  Long Term Goal ?Review of patient status, including review of consultant's reports, relevant laboratory and other test results, and medications completed ?Assessed patient's understanding of A1c goal:  <5.7% ?Provided education to patient about basic DM disease process ?Reviewed medications with patient and discussed importance of medication adherence ?Assessed social determinant of health barriers ?Determined patient has increased her walking and is modifying her diet to adhere to a diabetic friendly diet  ?Discussed plans with patient for ongoing care management follow up and provided patient with direct contact information for care management team ?Lab Results  ?Component Value Date  ? HGBA1C 5.3 01/01/2022  ?  ?Lab Results  ?Component Value Date  ? HGBA1C 5.9 (H) 09/06/2021  ?Depression:  (Status:  Goal on track:  Yes.)  Long Term Goal ?Evaluation of current treatment plan related to  Depression , self-management and patient's adherence to plan as established by provider ?Determined patient continues to struggle with depression, she reports finding the Lexapro effective however she feels she will benefit from Psychiatry and counseling ?Provided active listening to patient and positive reinforcement was provided to patient for reporting her symptoms and requesting professional help  ?Educated patient on the benefits of receiving professional advise and counseling  ?Reviewed medications with patient and discussed importance of medication adherence ?Determined patient uses distraction, church and prayer to help her cope and finds this to be somewhat effective, she continues to experience lack of interest in doing the things she  enjoys doing, she denies SI  ?Encouraged patient to continue to stay socially engaged with friends/family to help with depression ?Educated patient while providing rationale on the benefits of exercise to help with mental health as well as overall physical health  ?Collaboration with PCP  provider regarding patient's symptoms of depression and requested a referral for [facility]  ?Educated patient on referral process and instructed patient to contact this RN CM if she does not re

## 2022-01-17 NOTE — Patient Instructions (Signed)
Social Worker Visit Information ? ?Goals we discussed today:  ?Patient Goals/Self-Care Activities ?patient will:  ? - work with SW to obtain transportation resources ?  ? ?Materials Provided: Verbal education about transportation resource provided by phone ? ?Patient verbalizes understanding of instructions and care plan provided today and agrees to view in [place]. Active MyChart status confirmed with patient.   ? ?Follow Up Plan: SW will follow up with patient by phone over the next week ? ? ?Daneen Schick, BSW, CDP ?Social Worker, Certified Dementia Practitioner ?[facility] / [place] [facility] ?[phone] ? ?   ? ?

## 2022-01-18 ENCOUNTER — Other Ambulatory Visit: Payer: Self-pay

## 2022-01-22 ENCOUNTER — Ambulatory Visit: Payer: Self-pay

## 2022-01-22 DIAGNOSIS — F3289 Other specified depressive episodes: Secondary | ICD-10-CM

## 2022-01-22 DIAGNOSIS — I10 Essential (primary) hypertension: Secondary | ICD-10-CM

## 2022-01-22 DIAGNOSIS — E669 Obesity, unspecified: Secondary | ICD-10-CM

## 2022-01-22 NOTE — Patient Instructions (Signed)
Social Worker Visit Information ? ?Goals we discussed today:  ?Patient Goals/Self-Care Activities ?patient will:  ? - work with SW to obtain transportation resources ? ?Materials Provided: Verbal education about transportation services provided by phone ? ?Patient verbalizes understanding of instructions and care plan provided today and agrees to view in [place]. Active MyChart status confirmed with patient.   ? ?Follow Up Plan: SW will follow up with patient by phone over the next month ? ? ?Daneen Schick, BSW, CDP ?Social Worker, Certified Dementia Practitioner ?[facility] / [place] Management ?[phone] ? ?   ? ?

## 2022-01-22 NOTE — Chronic Care Management (AMB) (Signed)
?Chronic Care Management  ? ? Social Work Note ? ?01/22/2022 ?Name: [name] MRN: [id number] DOB: [birth year] ? ?[name] is a 66 y.o. year old female who is a primary care patient of [name], [name]. The CCM team was consulted to assist the patient with chronic disease management and/or care coordination needs related to:  DM II, HTN, Depression .  ? ?Engaged with patient by telephone for follow up visit in response to provider referral for social work chronic care management and care coordination services.  ? ?Consent to Services:  ?The patient was given information about Chronic Care Management services, agreed to services, and gave verbal consent prior to initiation of services.  Please see initial visit note for detailed documentation.  ? ?Patient agreed to services and consent obtained.  ? ?Assessment: Review of patient past medical history, allergies, medications, and health status, including review of relevant consultants reports was performed today as part of a comprehensive evaluation and provision of chronic care management and care coordination services.    ? ?SDOH (Social Determinants of Health) assessments and interventions performed:   ? ?Advanced Directives Status: Not addressed in this encounter. ? ?CCM Care Plan ? ?No Known Allergies ? ?Outpatient Encounter Medications as of 01/22/2022  ?Medication Sig  ? amLODipine (NORVASC) 5 MG tablet TAKE 1 TABLET (5 MG TOTAL) BY MOUTH DAILY.  ? aspirin 81 MG tablet Take 1 tablet (81 mg total) by mouth daily. Take x 1 month post op to decrease risk of blood clots.  ? atorvastatin (LIPITOR) 10 MG tablet Take 1 tablet (10 mg total) by mouth daily.  ? dorzolamide-timolol (COSOPT) 22.3-6.8 MG/ML ophthalmic solution Place 1 drop into both eyes 2 (two) times daily.  ? escitalopram (LEXAPRO) 10 MG tablet TAKE 1 TABLET (10 MG TOTAL) BY MOUTH DAILY.  ? ferrous sulfate 325 (65 FE) MG tablet Take 1 tablet (325 mg total) by mouth 2 (two) times daily with  a meal.  ? glucose blood (ONETOUCH ULTRA) test strip USE TO CHECK BLOOD SUGAR ONCE A DAY AS INSTRUCTED  ? Lancets (ONETOUCH DELICA PLUS 123XX123) MISC USE TO CHECK BLOOD SUGAR ONCE A DAY AS INSTRUCTED  ? nicotine (NICODERM CQ - DOSED IN MG/24 HOURS) 21 mg/24hr patch Place 1 patch (21 mg total) onto the skin daily.  ? valsartan (DIOVAN) 160 MG tablet TAKE 1 TABLET (160 MG TOTAL) BY MOUTH DAILY.  ? Vitamin D, Ergocalciferol, (DRISDOL) 1.25 MG (50000 UNIT) CAPS capsule Take 1 capsule (50,000 Units total) by mouth every 7 (seven) days.  ? XULTOPHY 100-3.6 UNIT-MG/ML SOPN Inject 10 Units into the skin daily.  ? ?No facility-administered encounter medications on file as of 01/22/2022.  ? ? ?Patient Active Problem List  ? Diagnosis Date Noted  ? BMI 36.0-36.9,adult 01/07/2021  ? CMV (cytomegalovirus) antibody positive 03/02/2020  ? Iron deficiency anemia 01/08/2020  ? 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 Chronic back pain 01/07/2020  ? Tobacco dependence 01/07/2020  ? Hypertensive retinopathy of both eyes 02/18/2019  ? Combined form of age-related cataract, both eyes 02/18/2019  ? Insomnia 06/08/2018  ? Essential hypertension 06/08/2018  ? Diabetes ([place]) 06/10/2016  ? Traumatic closed displaced fracture of shaft of right tibia and fibula 06/10/2016  ? ? ?Conditions to be addressed/monitored: HTN, DMII, and Depression; Transportation ? ?Care Plan : Social Work Plan of Care  ?Updates made by [name] since 01/22/2022 12:00 AM  ?  ? ?Problem: Barriers to Treatment   ?  ? ?Goal: Identify Transportation Resources   ?  Start Date: 01/04/2022  ?Recent Progress: On track  ?Priority: High  ?Note:   ?Current Barriers:  ?Chronic disease management support and education needs related to HTN and DM  ?Transportation ? ?Social Worker Clinical Goal(s):  ?patient will work with SW to identify and address any acute and/or chronic care coordination needs related to the self health management of HTN and DM  ?explore community resource  options for unmet needs related to:  Transportation ?SW Interventions:  ?Inter-disciplinary care team collaboration (see longitudinal plan of care) ?Collaboration with Kristin Brine, FNP regarding development and update of comprehensive plan of care as evidenced by provider attestation and co-signature ?Collaboration with [name] with [employer] ([facility] [employer]) who indicates she is unable to process patients application in time for the patient to access transportation on 5/17 ?Successful outbound call placed to the patient to update on her application status. The patient indicates she has priced out the cost of the ride and will be able to afford the cost ?Reviewed the patient will receive information in the mail regarding application status once it is processed ?Scheduled follow up call over the next 21 days to assess goal progression ? ?Patient Goals/Self-Care Activities ?patient will:  ? -  work with SW to obtain transportation resources ? ?Follow Up Plan:  SW will follow up with the patient over the next 21 days ? ?  ?  ? ?Follow Up Plan: SW will follow up with patient by phone over the next month. ?     ?[name], BSW, CDP ?Social Worker, Certified Dementia Practitioner ?[facility] [place] [facility] ?[phone] ? ?   ? ? ? ? ?

## 2022-01-24 ENCOUNTER — Ambulatory Visit
Admission: RE | Admit: 2022-01-24 | Discharge: 2022-01-24 | Disposition: A | Discharge status: Home / Self Care | Payer: Medicare PPO | Source: Ambulatory Visit | Attending: Nurse Practitioner | Admitting: Nurse Practitioner

## 2022-01-24 DIAGNOSIS — Z720 Tobacco use: Secondary | ICD-10-CM

## 2022-01-24 DIAGNOSIS — F17210 Nicotine dependence, cigarettes, uncomplicated: Secondary | ICD-10-CM | POA: Diagnosis not present

## 2022-01-25 ENCOUNTER — Other Ambulatory Visit: Payer: Self-pay | Admitting: Nurse Practitioner

## 2022-02-06 ENCOUNTER — Telehealth: Payer: Self-pay

## 2022-02-06 ENCOUNTER — Telehealth: Payer: Medicare PPO

## 2022-02-06 NOTE — Telephone Encounter (Signed)
  Care Management   Follow Up Note   02/06/2022 Name: [name] MRN: [id number] DOB: [birth year]   Referred by: [name], FNP Reason for referral : Chronic Care Management (Inbound call from patient )  Received voice message from patient requesting a return call. Patient states she did not receive a [employer] application and needs transportation for her eye appointment on 02/07/22. Collaboration with embedded [place] regarding patient's [employer] application status.  An unsuccessful telephone outreach was attempted today. The patient was referred to the case management team for assistance with care management and care coordination. Sent patient message through my chart with instructions for accessing [employer].   Follow Up Plan: The care management team will reach out to the patient again over the next 7-14 days.   Barb Merino, RN, BSN, CCM Care Management Coordinator [facility]  Direct Phone: [phone]

## 2022-02-07 ENCOUNTER — Encounter (INDEPENDENT_AMBULATORY_CARE_PROVIDER_SITE_OTHER): Payer: Medicare PPO | Admitting: Ophthalmology

## 2022-02-07 DIAGNOSIS — E669 Obesity, unspecified: Secondary | ICD-10-CM

## 2022-02-07 DIAGNOSIS — Z7985 Long-term (current) use of injectable non-insulin antidiabetic drugs: Secondary | ICD-10-CM | POA: Diagnosis not present

## 2022-02-07 DIAGNOSIS — Z6830 Body mass index (BMI) 30.0-30.9, adult: Secondary | ICD-10-CM

## 2022-02-07 DIAGNOSIS — H34812 Central retinal vein occlusion, left eye, with macular edema: Secondary | ICD-10-CM | POA: Diagnosis not present

## 2022-02-07 DIAGNOSIS — F3289 Other specified depressive episodes: Secondary | ICD-10-CM

## 2022-02-07 DIAGNOSIS — H43813 Vitreous degeneration, bilateral: Secondary | ICD-10-CM | POA: Diagnosis not present

## 2022-02-07 DIAGNOSIS — E113391 Type 2 diabetes mellitus with moderate nonproliferative diabetic retinopathy without macular edema, right eye: Secondary | ICD-10-CM

## 2022-02-07 DIAGNOSIS — F17210 Nicotine dependence, cigarettes, uncomplicated: Secondary | ICD-10-CM | POA: Diagnosis not present

## 2022-02-07 DIAGNOSIS — I10 Essential (primary) hypertension: Secondary | ICD-10-CM

## 2022-02-07 DIAGNOSIS — H35033 Hypertensive retinopathy, bilateral: Secondary | ICD-10-CM | POA: Diagnosis not present

## 2022-02-07 DIAGNOSIS — H2513 Age-related nuclear cataract, bilateral: Secondary | ICD-10-CM

## 2022-02-07 DIAGNOSIS — E113592 Type 2 diabetes mellitus with proliferative diabetic retinopathy without macular edema, left eye: Secondary | ICD-10-CM | POA: Diagnosis not present

## 2022-02-07 DIAGNOSIS — F32A Depression, unspecified: Secondary | ICD-10-CM | POA: Diagnosis not present

## 2022-02-07 DIAGNOSIS — E1169 Type 2 diabetes mellitus with other specified complication: Secondary | ICD-10-CM

## 2022-02-07 DIAGNOSIS — I119 Hypertensive heart disease without heart failure: Secondary | ICD-10-CM

## 2022-02-12 ENCOUNTER — Ambulatory Visit (INDEPENDENT_AMBULATORY_CARE_PROVIDER_SITE_OTHER): Payer: Medicare PPO

## 2022-02-12 DIAGNOSIS — E1169 Type 2 diabetes mellitus with other specified complication: Secondary | ICD-10-CM

## 2022-02-12 DIAGNOSIS — F3289 Other specified depressive episodes: Secondary | ICD-10-CM

## 2022-02-12 DIAGNOSIS — I10 Essential (primary) hypertension: Secondary | ICD-10-CM

## 2022-02-12 NOTE — Chronic Care Management (AMB) (Signed)
Chronic Care Management    Social Work Note  02/12/2022 Name: [name] MRN: [id number] DOB: [birth year]  [name] is a 66 y.o. year old female who is a primary care patient of Arnette Felts, FNP. The CCM team was consulted to assist the patient with chronic disease management and/or care coordination needs related to:  DM II, HTN, Depression .   Engaged with patient by telephone for follow up visit in response to provider referral for social work chronic care management and care coordination services.   Consent to Services:  The patient was given information about Chronic Care Management services, agreed to services, and gave verbal consent prior to initiation of services.  Please see initial visit note for detailed documentation.   Patient agreed to services and consent obtained.   Assessment: Review of patient past medical history, allergies, medications, and health status, including review of relevant consultants reports was performed today as part of a comprehensive evaluation and provision of chronic care management and care coordination services.     SDOH (Social Determinants of Health) assessments and interventions performed:    Advanced Directives Status: Not addressed in this encounter.  CCM Care Plan  No Known Allergies  Outpatient Encounter Medications as of 02/12/2022  Medication Sig   amLODipine (NORVASC) 5 MG tablet TAKE 1 TABLET (5 MG TOTAL) BY MOUTH DAILY.   atorvastatin (LIPITOR) 10 MG tablet Take 1 tablet (10 mg total) by mouth daily.   dorzolamide-timolol (COSOPT) 22.3-6.8 MG/ML ophthalmic solution Place 1 drop into both eyes 2 (two) times daily.   escitalopram (LEXAPRO) 10 MG tablet TAKE 1 TABLET (10 MG TOTAL) BY MOUTH DAILY.   ferrous sulfate 325 (65 FE) MG tablet Take 1 tablet (325 mg total) by mouth 2 (two) times daily with a meal.   glucose blood (ONETOUCH ULTRA) test strip USE TO CHECK BLOOD SUGAR ONCE A DAY AS INSTRUCTED   Lancets (ONETOUCH  DELICA PLUS LANCET33G) MISC USE TO CHECK BLOOD SUGAR ONCE A DAY AS INSTRUCTED   nicotine (NICODERM CQ - DOSED IN MG/24 HOURS) 21 mg/24hr patch Place 1 patch (21 mg total) onto the skin daily.   valsartan (DIOVAN) 160 MG tablet TAKE 1 TABLET (160 MG TOTAL) BY MOUTH DAILY.   Vitamin D, Ergocalciferol, (DRISDOL) 1.25 MG (50000 UNIT) CAPS capsule Take 1 capsule (50,000 Units total) by mouth every 7 (seven) days.   XULTOPHY 100-3.6 UNIT-MG/ML SOPN Inject 10 Units into the skin daily.   No facility-administered encounter medications on file as of 02/12/2022.    Patient Active Problem List   Diagnosis Date Noted   BMI 36.0-36.9,adult 01/07/2021   CMV (cytomegalovirus) antibody positive 03/02/2020   Iron deficiency anemia 01/08/2020   Folate deficiency anemia 01/07/2020   Chronic back pain 01/07/2020   Tobacco dependence 01/07/2020   Hypertensive retinopathy of both eyes 02/18/2019   Combined form of age-related cataract, both eyes 02/18/2019   Insomnia 06/08/2018   Essential hypertension 06/08/2018   Diabetes ([facility]) 06/10/2016   Traumatic closed displaced fracture of shaft of right tibia and fibula 06/10/2016    Conditions to be addressed/monitored: HTN, DMII, and Depression; Transportation  Care Plan : Social Work Plan of Care  Updates made by Bevelyn Ngo since 02/12/2022 12:00 AM  Completed 02/12/2022   Problem: Barriers to Treatment Resolved 02/12/2022     Goal: Identify Transportation Resources Completed 02/12/2022  Start Date: 01/04/2022  Recent Progress: On track  Priority: High  Note:   Current Barriers:  Chronic disease management support  and education needs related to HTN and DM  Transportation  Social Worker Clinical Goal(s):  patient will work with SW to identify and address any acute and/or chronic care coordination needs related to the self health management of HTN and DM  explore community resource options for unmet needs related to:  Transportation SW Interventions:   Inter-disciplinary care team collaboration (see longitudinal plan of care) Collaboration with Arnette Felts, FNP regarding development and update of comprehensive plan of care as evidenced by provider attestation and co-signature Collaboration with Lorretta Harp with [employer] (formerly [employer]) who indicates she is unable to process patients application until an update to one question can be made; SW updated application on behalf of the patient and submitted Determined the patients determination should be processed within the next 10 days Telephonic visit completed with the patient to advise of application status and plan for her to be updated directly by [employer] regarding determination Goal closed  Patient Goals/Self-Care Activities patient will:   -  Engage with [employer] regarding application determination         Follow Up Plan:  No SW follow up planned at this time. The patient is encouraged to contact SW as needed. The patient will remain engaged with RN Care Manager to address care management needs.      Bevelyn Ngo, BSW, CDP Social Worker, Certified Dementia Practitioner [facility] Care Management [phone]

## 2022-02-12 NOTE — Patient Instructions (Signed)
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 Active MyChart status and patient understanding of how to access instructions and care plan via MyChart confirmed with patient.     Follow Up Plan:  No planned follow up at this time. Please contact me as needed.  Bevelyn Ngo, BSW, CDP Social Worker, Certified Dementia Practitioner [facility] Care Management [phone]

## 2022-02-13 ENCOUNTER — Telehealth: Payer: Medicare PPO

## 2022-03-01 ENCOUNTER — Telehealth: Payer: Medicare PPO

## 2022-03-01 ENCOUNTER — Ambulatory Visit: Payer: Self-pay

## 2022-03-01 DIAGNOSIS — Z720 Tobacco use: Secondary | ICD-10-CM

## 2022-03-01 DIAGNOSIS — F32A Depression, unspecified: Secondary | ICD-10-CM

## 2022-03-01 DIAGNOSIS — I2510 Atherosclerotic heart disease of native coronary artery without angina pectoris: Secondary | ICD-10-CM

## 2022-03-01 DIAGNOSIS — E1169 Type 2 diabetes mellitus with other specified complication: Secondary | ICD-10-CM

## 2022-03-01 DIAGNOSIS — I10 Essential (primary) hypertension: Secondary | ICD-10-CM

## 2022-03-01 DIAGNOSIS — E559 Vitamin D deficiency, unspecified: Secondary | ICD-10-CM

## 2022-03-01 NOTE — Patient Instructions (Signed)
Visit Information  Thank you for taking time to visit with me today. Please don't hesitate to contact me if I can be of assistance to you before our next scheduled telephone appointment.  Following are the goals we discussed today:  Take all medications as prescribed Attend all scheduled provider appointments Call pharmacy for medication refills 3-7 days in advance of running out of medications Perform all self care activities independently  Call provider office for new concerns or questions  Work with the social worker to address care coordination needs and will continue to work with the clinical team to address health care and disease management related needs keep appointment with eye doctor check blood sugar at prescribed times: once daily and when you have symptoms of low or high blood sugar drink 6 to 8 glasses of water each day fill half of plate with vegetables manage portion size check blood pressure weekly call doctor for signs and symptoms of high blood pressure take medications for blood pressure exactly as prescribed begin an exercise program Call [employer] to inquire about in-network benefits as discussed today   Our next appointment is by telephone on 03/15/22 at 1:00 PM   Please call the care guide team at [phone] if you need to cancel or reschedule your appointment.   If you are experiencing a Mental Health or Behavioral Health Crisis or need someone to talk to, please call 1-800-273-TALK (toll free, 24 hour hotline)   Patient verbalizes understanding of instructions and care plan provided today and agrees to view in MyChart. Active MyChart status and patient understanding of how to access instructions and care plan via MyChart confirmed with patient.     Delsa Sale, RN, BSN, CCM Care Management Coordinator [facility] Care Management/[facility]  Direct Phone: [phone]

## 2022-03-01 NOTE — Chronic Care Management (AMB) (Cosign Needed)
Chronic Care Management   CCM RN Visit Note  03/01/2022 Name: [name] MRN: [id number] DOB: [birth year]  Subjective: [name] is a 66 y.o. year old female who is a primary care patient of [name], [place]. The care management team was consulted for assistance with disease management and care coordination needs.    Engaged with patient by telephone for follow up visit in response to provider referral for case management and/or care coordination services.   Consent to Services:  The patient was given information about Chronic Care Management services, agreed to services, and gave verbal consent prior to initiation of services.  Please see initial visit note for detailed documentation.   Patient agreed to services and verbal consent obtained.   Assessment: Review of patient past medical history, allergies, medications, health status, including review of consultants reports, laboratory and other test data, was performed as part of comprehensive evaluation and provision of chronic care management services.   SDOH (Social Determinants of Health) assessments and interventions performed:  Yes, provided patient with the contact number for [facility] ([facility]  Micro  No Known Allergies  Outpatient Encounter Medications as of 03/01/2022  Medication Sig   amLODipine (NORVASC) 5 MG tablet TAKE 1 TABLET (5 MG TOTAL) BY MOUTH DAILY.   atorvastatin (LIPITOR) 10 MG tablet Take 1 tablet (10 mg total) by mouth daily.   dorzolamide-timolol (COSOPT) 22.3-6.8 MG/ML ophthalmic solution Place 1 drop into both eyes 2 (two) times daily.   escitalopram (LEXAPRO) 10 MG tablet TAKE 1 TABLET (10 MG TOTAL) BY MOUTH DAILY.   ferrous sulfate 325 (65 FE) MG tablet Take 1 tablet (325 mg total) by mouth 2 (two) times daily with a meal.   glucose blood (ONETOUCH ULTRA) test strip USE TO CHECK BLOOD SUGAR ONCE A DAY AS INSTRUCTED   Lancets (ONETOUCH DELICA PLUS [id number])  MISC USE TO CHECK BLOOD SUGAR ONCE A DAY AS INSTRUCTED   nicotine (NICODERM CQ - DOSED IN MG/24 HOURS) 21 mg/24hr patch Place 1 patch (21 mg total) onto the skin daily.   valsartan (DIOVAN) 160 MG tablet TAKE 1 TABLET (160 MG TOTAL) BY MOUTH DAILY.   Vitamin D, Ergocalciferol, (DRISDOL) 1.25 MG (50000 UNIT) CAPS capsule Take 1 capsule (50,000 Units total) by mouth every 7 (seven) days.   XULTOPHY 100-3.6 UNIT-MG/ML SOPN Inject 10 Units into the skin daily.   No facility-administered encounter medications on file as of 03/01/2022.    Patient Active Problem List   Diagnosis Date Noted   BMI 36.0-36.9,adult 01/07/2021   CMV (cytomegalovirus) antibody positive 03/02/2020   Iron deficiency anemia 01/08/2020   Folate deficiency anemia 01/07/2020   Chronic back pain 01/07/2020   Tobacco dependence 01/07/2020   Hypertensive retinopathy of both eyes 02/18/2019   Combined form of age-related cataract, both eyes 02/18/2019   Insomnia 06/08/2018   Essential hypertension 06/08/2018   Diabetes ([place]) 06/10/2016   Traumatic closed displaced fracture of shaft of right tibia and fibula 06/10/2016    Conditions to be addressed/monitored: Type 2 Diabetes Mellitus, Essential hypertension, Vitamin D deficiency, Depression, Atherosclerotic Cardiovascular disease, Tobacco abuse     Care Plan : RN Care Manager Plan of Care  Updates made by Lynne Logan, RN since 03/01/2022 12:00 AM     Problem: No plan of care established for management of chronic disease states (Type 2 Diabetes Mellitus, Essential hypertension, Vitamin D deficiency, Depression)   Priority: High     Long-Range Goal: Development of  plan of care for chronic disease management (Type 2 Diabetes Mellitus, Essential hypertension, Vitamin D deficiency, Depression)   Start Date: 08/17/2021  Expected End Date: 08/17/2022  Recent Progress: On track  Priority: High  Note:   Current Barriers:  Knowledge Deficits related to plan of care for  management of Type 2 Diabetes Mellitus, Essential hypertension, Vitamin D deficiency, Depression   Chronic Disease Management support and education needs related to Type 2 Diabetes Mellitus, Essential hypertension, Vitamin D deficiency, Depression   Financial Restraints Transportation barriers Pt states she is now taking the Xultophy in the morning not the Rybelsus.  States her CBG was 117.  Denies any hypoglycemia.  States she has not been exercising but like to do more.  States she is eating less and her weight is down to 173 now. RNCM Clinical Goal(s):  Patient will verbalize basic understanding of  Type 2 Diabetes Mellitus, Essential hypertension, Vitamin D deficiency, Depression  disease process and self health management plan as evidenced by patient will report having no disease exacerbations related to her chronic disease states take all medications exactly as prescribed and will call provider for medication related questions as evidenced by patient will report having no missed doses of her prescribed medications  demonstrate Ongoing health management independence as evidenced by patient will report 100% adherence to her prescribed treatment plan  continue to work with RN Care Manager to address care management and care coordination needs related to  Type 2 Diabetes Mellitus, Essential hypertension, Vitamin D deficiency, Depression  as evidenced by adherence to CM Team Scheduled appointments demonstrate ongoing self health care management ability   as evidenced by    through collaboration with RN Care manager, provider, and care team.   Interventions: 1:1 collaboration with primary care provider regarding development and update of comprehensive plan of care as evidenced by provider attestation and co-signature Inter-disciplinary care team collaboration (see longitudinal plan of care) Evaluation of current treatment plan related to  self management and patient's adherence to plan as established  by provider  Diabetes Interventions:  (Status:  Goal Met.) Long Term Goal Review of patient status, including review of consultant's reports, relevant laboratory and other test results, and medications completed Assessed patient's understanding of A1c goal:  <5.7% Provided education to patient about basic DM disease process Reviewed medications with patient and discussed importance of medication adherence Assessed social determinant of health barriers Determined patient has increased her walking and is modifying her diet to adhere to a diabetic friendly diet  Discussed plans with patient for ongoing care management follow up and provided patient with direct contact information for care management team Lab Results  Component Value Date   HGBA1C 5.3 01/01/2022    Lab Results  Component Value Date   HGBA1C 5.9 (H) 09/06/2021  Depression:  (Status:  Goal on track:  Yes.)  Long Term Goal Evaluation of current treatment plan related to  Depression , self-management and patient's adherence to plan as established by provider Determined patient is unable to receive psychiatric counseling from [facility] due to having a high copay Discussed patient would like PCP to send a new referral to [facility], patient will call to confirm health plan benefits  Sent secure message to PCP provider requesting a new referral be sent to above provider  Collaboration with embedded BSW Daneen Schick regarding resources for mental health provider's that may be more affordable for patient  Provided active listening to patient and positive reinforcement was provided to patient  for reporting her symptoms and requesting professional help  Educated patient on the benefits of receiving professional advise and counseling  Reviewed medications with patient and discussed importance of medication adherence Determined patient uses distraction, church and prayer to help her cope and finds this to be somewhat  effective, she continues to experience lack of interest in doing the things she enjoys doing, she denies SI  Encouraged patient to continue to stay socially engaged with friends/family to help with depression Patient's goal is to receive professional counseling and discuss medication alternatives  Educated patient while providing rationale on the benefits of exercise to help with mental health as well as overall physical health  Discussed plans with patient for ongoing care management follow up and provided patient with direct contact information for care management team  Hypertension Interventions:  (Status:  Goal Met.) Long Term Goal Last practice recorded BP readings:  BP Readings from Last 3 Encounters:  01/01/22 130/74  09/06/21 130/78  06/07/21 132/78  Most recent eGFR/CrCl:  Lab Results  Component Value Date   EGFR 65 01/01/2022    No components found for: "CRCL" Evaluation of current treatment plan related to hypertension self management and patient's adherence to plan as established by provider Counseled on the importance of exercise goals with target of 150 minutes per week Provided education on prescribed diet low Sodium Instructed patient to notify her doctor of new symptoms or concerns   Tobacco Abuse Interventions: (Status:  New goal.)  Long Term Goal Evaluation of current treatment plan related to Tobacco Use, self-management and patient's adherence to plan as established by provider Determined patient would like to quit smoking and is interested in trying Nicoderm patches Determined cost is an issue and this is why she did not purchase the Nicotine patches in the past Explored Good Rx coupon with patient and determined with this coupon, patient can afford to purchase a 28 day supply Educated patient on the benefits of quitting smoking and praised patient for making the decision to quit Reviewed medications with patient and discussed indication, usage and frequency of  Nicoderm patches to help with smoking cessation  Reviewed scheduled/upcoming provider appointment including: CT Chest Lung CA Screen scheduled for 01/24/22 _0  Collaborated with PCP provider regarding Rx for Nicoderm, requested to send to [facility] on [address] in order for patient to use a Counselling psychologist related to  a Guide to help with Smoking Cessation  Discussed plans with patient for ongoing care management follow up and provided patient with direct contact information for care management team  Weight Loss Interventions:  (Status:  New goal.)  Long Term Goal Evaluation of current treatment plan related to  abnormal weight loss , self-management and patient's adherence to plan as established by provider Reviewed and discussed abnormal weight loss, patient states she does not have a good appetite, she reports having a recent episode of diarrhea and vomiting but it has subsided Assessed for other GI symptoms including symptoms suggestive of GERD and or swallowing difficulties, patient believes she has GERD but is not being treated for this condition  Determined patient feels her depression is effecting her appetite (see goal for depression) Reviewed mediations with patient and advised of potential SE related to Wellbutrin including appetite suppression which may lead to weight loss Reviewed and discussed PCP recommendations for GI referral for further evaluation and treatment  Educated patient on referral process and instructed patient to contact this RN CM if she does not  receive a call from GI within 2 weeks  Instructed patient to obtain weekly weights, placing the scales on a flat surface first thing in the morning with light weight clothing after emptying your bladder, report ongoing concerns regarding weight loss and or other GI symptoms or acute symptoms of illness Discussed plans with patient for ongoing care management follow up and provided  patient with direct contact information for care management team  Wt Readings from Last 3 Encounters: 01/01/22 : 159 lb (72.1 kg) 09/06/21 : 178 lb (80.7 kg) 06/07/21 : 183 lb (83 kg)   Patient Goals/Self-Care Activities: Take all medications as prescribed Attend all scheduled provider appointments Call pharmacy for medication refills 3-7 days in advance of running out of medications Perform all self care activities independently  Call provider office for new concerns or questions  Work with the social worker to address care coordination needs and will continue to work with the clinical team to address health care and disease management related needs keep appointment with eye doctor check blood sugar at prescribed times: once daily and when you have symptoms of low or high blood sugar drink 6 to 8 glasses of water each day fill half of plate with vegetables manage portion size check blood pressure weekly call doctor for signs and symptoms of high blood pressure take medications for blood pressure exactly as prescribed begin an exercise program Call [employer] to inquire about in-network benefits as discussed today   Follow Up Plan:  Telephone follow up appointment with care management team member scheduled for:  03/15/22     Barb Merino, RN, BSN, CCM Care Management Coordinator [place] [facility]  Direct Phone: [phone]

## 2022-03-02 ENCOUNTER — Other Ambulatory Visit: Payer: Self-pay | Admitting: Nurse Practitioner

## 2022-03-02 DIAGNOSIS — E559 Vitamin D deficiency, unspecified: Secondary | ICD-10-CM

## 2022-03-09 DIAGNOSIS — F3289 Other specified depressive episodes: Secondary | ICD-10-CM | POA: Diagnosis not present

## 2022-03-09 DIAGNOSIS — I10 Essential (primary) hypertension: Secondary | ICD-10-CM | POA: Diagnosis not present

## 2022-03-09 DIAGNOSIS — E1169 Type 2 diabetes mellitus with other specified complication: Secondary | ICD-10-CM | POA: Diagnosis not present

## 2022-03-09 DIAGNOSIS — F32A Depression, unspecified: Secondary | ICD-10-CM | POA: Diagnosis not present

## 2022-03-09 DIAGNOSIS — I2510 Atherosclerotic heart disease of native coronary artery without angina pectoris: Secondary | ICD-10-CM | POA: Diagnosis not present

## 2022-03-09 DIAGNOSIS — E669 Obesity, unspecified: Secondary | ICD-10-CM | POA: Diagnosis not present

## 2022-03-15 ENCOUNTER — Telehealth: Payer: Medicare PPO

## 2022-03-15 ENCOUNTER — Ambulatory Visit (INDEPENDENT_AMBULATORY_CARE_PROVIDER_SITE_OTHER): Payer: Medicare PPO

## 2022-03-15 DIAGNOSIS — I10 Essential (primary) hypertension: Secondary | ICD-10-CM

## 2022-03-15 DIAGNOSIS — E559 Vitamin D deficiency, unspecified: Secondary | ICD-10-CM

## 2022-03-15 DIAGNOSIS — F32A Depression, unspecified: Secondary | ICD-10-CM

## 2022-03-15 DIAGNOSIS — Z720 Tobacco use: Secondary | ICD-10-CM

## 2022-03-15 DIAGNOSIS — I2510 Atherosclerotic heart disease of native coronary artery without angina pectoris: Secondary | ICD-10-CM

## 2022-03-15 DIAGNOSIS — E1169 Type 2 diabetes mellitus with other specified complication: Secondary | ICD-10-CM

## 2022-03-15 NOTE — Chronic Care Management (AMB) (Signed)
Chronic Care Management   CCM RN Visit Note  03/15/2022 Name: [name] MRN: [id number] DOB: 1956/08/18  Subjective: [name] is a 66 y.o. year old female who is a primary care patient of [name], [place]. The care management team was consulted for assistance with disease management and care coordination needs.    Engaged with patient by telephone for follow up visit in response to provider referral for case management and/or care coordination services.   Consent to Services:  The patient was given information about Chronic Care Management services, agreed to services, and gave verbal consent prior to initiation of services.  Please see initial visit note for detailed documentation.   Patient agreed to services and verbal consent obtained.   Assessment: Review of patient past medical history, allergies, medications, health status, including review of consultants reports, laboratory and other test data, was performed as part of comprehensive evaluation and provision of chronic care management services.   SDOH (Social Determinants of Health) assessments and interventions performed:  Yes, no acute needs   CCM Care Plan  No Known Allergies  Outpatient Encounter Medications as of 03/15/2022  Medication Sig   amLODipine (NORVASC) 5 MG tablet TAKE 1 TABLET (5 MG TOTAL) BY MOUTH DAILY.   atorvastatin (LIPITOR) 10 MG tablet Take 1 tablet (10 mg total) by mouth daily.   dorzolamide-timolol (COSOPT) 22.3-6.8 MG/ML ophthalmic solution Place 1 drop into both eyes 2 (two) times daily.   escitalopram (LEXAPRO) 10 MG tablet TAKE 1 TABLET (10 MG TOTAL) BY MOUTH DAILY.   ferrous sulfate 325 (65 FE) MG tablet Take 1 tablet (325 mg total) by mouth 2 (two) times daily with a meal.   glucose blood (ONETOUCH ULTRA) test strip USE TO CHECK BLOOD SUGAR ONCE A DAY AS INSTRUCTED   Lancets (ONETOUCH DELICA PLUS [id number]) MISC USE TO CHECK BLOOD SUGAR ONCE A DAY AS INSTRUCTED   nicotine  (NICODERM CQ - DOSED IN MG/24 HOURS) 21 mg/24hr patch Place 1 patch (21 mg total) onto the skin daily.   valsartan (DIOVAN) 160 MG tablet TAKE 1 TABLET (160 MG TOTAL) BY MOUTH DAILY.   Vitamin D, Ergocalciferol, (DRISDOL) 1.25 MG (50000 UNIT) CAPS capsule TAKE 1 CAPSULE (50,000 UNITS TOTAL) BY MOUTH EVERY 7 (SEVEN) DAYS.   XULTOPHY 100-3.6 UNIT-MG/ML SOPN Inject 10 Units into the skin daily.   No facility-administered encounter medications on file as of 03/15/2022.    Patient Active Problem List   Diagnosis Date Noted   BMI 36.0-36.9,adult 01/07/2021   CMV (cytomegalovirus) antibody positive 03/02/2020   Iron deficiency anemia 01/08/2020   Folate deficiency anemia 01/07/2020   Chronic back pain 01/07/2020   Tobacco dependence 01/07/2020   Hypertensive retinopathy of both eyes 02/18/2019   Combined form of age-related cataract, both eyes 02/18/2019   Insomnia 06/08/2018   Essential hypertension 06/08/2018   Diabetes ([place]) 06/10/2016   Traumatic closed displaced fracture of shaft of right tibia and fibula 06/10/2016    Conditions to be addressed/monitored: Type 2 Diabetes Mellitus, Essential hypertension, Vitamin D deficiency, Depression, Atherosclerotic Cardiovascular disease, Tobacco abuse     Care Plan : RN Care Manager Plan of Care  Updates made by Lynne Logan, RN since 03/15/2022 12:00 AM     Problem: No plan of care established for management of chronic disease states (Type 2 Diabetes Mellitus, Essential hypertension, Vitamin D deficiency, Depression)   Priority: High     Long-Range Goal: Development of plan of care for chronic disease management (Type 2  Diabetes Mellitus, Essential hypertension, Vitamin D deficiency, Depression)   Start Date: 08/17/2021  Expected End Date: 08/17/2022  Recent Progress: On track  Priority: High  Note:   Current Barriers:  Knowledge Deficits related to plan of care for management of Type 2 Diabetes Mellitus, Essential hypertension, Vitamin D  deficiency, Depression   Chronic Disease Management support and education needs related to Type 2 Diabetes Mellitus, Essential hypertension, Vitamin D deficiency, Depression   Financial Restraints Transportation barriers Pt states she is now taking the Xultophy in the morning not the Rybelsus.  States her CBG was 117.  Denies any hypoglycemia.  States she has not been exercising but like to do more.  States she is eating less and her weight is down to 173 now. RNCM Clinical Goal(s):  Patient will verbalize basic understanding of  Type 2 Diabetes Mellitus, Essential hypertension, Vitamin D deficiency, Depression  disease process and self health management plan as evidenced by patient will report having no disease exacerbations related to her chronic disease states take all medications exactly as prescribed and will call provider for medication related questions as evidenced by patient will report having no missed doses of her prescribed medications  demonstrate Ongoing health management independence as evidenced by patient will report 100% adherence to her prescribed treatment plan  continue to work with RN Care Manager to address care management and care coordination needs related to  Type 2 Diabetes Mellitus, Essential hypertension, Vitamin D deficiency, Depression  as evidenced by adherence to CM Team Scheduled appointments demonstrate ongoing self health care management ability   as evidenced by    through collaboration with RN Care manager, provider, and care team.   Interventions: 1:1 collaboration with primary care provider regarding development and update of comprehensive plan of care as evidenced by provider attestation and co-signature Inter-disciplinary care team collaboration (see longitudinal plan of care) Evaluation of current treatment plan related to  self management and patient's adherence to plan as established by provider  Diabetes Interventions:  (Status:  Goal Met.) Long Term  Goal Review of patient status, including review of consultant's reports, relevant laboratory and other test results, and medications completed Assessed patient's understanding of A1c goal:  <5.7% Provided education to patient about basic DM disease process Reviewed medications with patient and discussed importance of medication adherence Assessed social determinant of health barriers Determined patient has increased her walking and is modifying her diet to adhere to a diabetic friendly diet  Discussed plans with patient for ongoing care management follow up and provided patient with direct contact information for care management team Lab Results  Component Value Date   HGBA1C 5.3 01/01/2022    Lab Results  Component Value Date   HGBA1C 5.9 (H) 09/06/2021  Depression:  (Status:  Goal Met.)  Long Term Goal Evaluation of current treatment plan related to  Depression , self-management and patient's adherence to plan as established by provider Sent secure message to PCP provider Arnette Felts FNP requesting referral for behavioral health be sent to [facility] due to [facility] is out of network Educated patient on referral process, patient verbalizes understanding to contact PCP if she does not receive a call from [facility] within 2-3 weeks  Encouraged patient to continue to stay socially engaged with friends/family to help with depression Patient's goal is to receive professional counseling and discuss medication alternatives  Discussed plans with patient for ongoing care management follow up and provided patient with direct contact information for care management team  Hypertension Interventions:  (Status:  Goal Met.) Long Term Goal Last practice recorded BP readings:  BP Readings from Last 3 Encounters:  01/01/22 130/74  09/06/21 130/78  06/07/21 132/78  Most recent eGFR/CrCl:  Lab Results  Component Value Date   EGFR 65 01/01/2022    No components found for: "CRCL" Evaluation of  current treatment plan related to hypertension self management and patient's adherence to plan as established by provider Counseled on the importance of exercise goals with target of 150 minutes per week Provided education on prescribed diet low Sodium Instructed patient to notify her doctor of new symptoms or concerns   Tobacco Abuse Interventions: (Status:  Goal Not Met.)  Long Term Goal Evaluation of current treatment plan related to Tobacco Use, self-management and patient's adherence to plan as established by provider Discussed patient did not pick up Rx for Nicoderm patches although she is still interested in quitting Educated patient on how to use GoodRx to explore coupons for this medication and how to access Encouraged patient to talk to her pharmacist regarding additional coupon options and or questions concerning this medication   Weight Loss Interventions:  (Status:  Goal Met )  Long Term Goal Evaluation of current treatment plan related to  abnormal weight loss , self-management and patient's adherence to plan as established by provider Determined patient is trying to eat more frequent meals, she is gaining her weight back Determined patient did not follow up with Dr. Collene Mares, she cancelled the appointment and forgot to reschedule Assessed for SDOH barriers, none identified, patient is using [facility]) for transportation and has a good understanding about how to set up her transportation as needed Instructed patient to continue to check her weight and to notify her PCP of persistent weight loss or concerns Encouraged patient to consider rescheduling her appointment with Dr. Collene Mares, especially if she continues to have change in appetite and weight loss and or other GI related symptoms  Wt Readings from Last 3 Encounters: 01/01/22 : 159 lb (72.1 kg) 09/06/21 : 178 lb (80.7 kg) 06/07/21 : 183 lb (83 kg)  Patient Goals/Self-Care Activities: Take all medications as  prescribed Attend all scheduled provider appointments Call pharmacy for medication refills 3-7 days in advance of running out of medications Perform all self care activities independently  Call provider office for new concerns or questions  Work with the social worker to address care coordination needs and will continue to work with the clinical team to address health care and disease management related needs keep appointment with eye doctor check blood sugar at prescribed times: once daily and when you have symptoms of low or high blood sugar drink 6 to 8 glasses of water each day fill half of plate with vegetables manage portion size check blood pressure weekly call doctor for signs and symptoms of high blood pressure take medications for blood pressure exactly as prescribed begin an exercise program Start using your Nicoderm patches as soon as possible  Continue to monitor your weights at home and keep your PCP informed of changes to your appetite and or persistent weight loss Schedule your bone density scan when ready   Follow Up Plan:  No further follow up required     Barb Merino, RN, BSN, CCM Care Management Coordinator [place] [facility]  Direct Phone: [phone]

## 2022-03-15 NOTE — Patient Instructions (Signed)
Visit Information  Thank you for taking time to visit with me today. Please don't hesitate to contact me if I can be of assistance to you before our next scheduled telephone appointment.  Following are the goals we discussed today:  Take all medications as prescribed Attend all scheduled provider appointments Call pharmacy for medication refills 3-7 days in advance of running out of medications Perform all self care activities independently  Call provider office for new concerns or questions  Work with the social worker to address care coordination needs and will continue to work with the clinical team to address health care and disease management related needs keep appointment with eye doctor check blood sugar at prescribed times: once daily and when you have symptoms of low or high blood sugar drink 6 to 8 glasses of water each day fill half of plate with vegetables manage portion size check blood pressure weekly call doctor for signs and symptoms of high blood pressure take medications for blood pressure exactly as prescribed begin an exercise program Start using your Nicoderm patches as soon as possible  Continue to monitor your weights at home and keep your PCP informed of changes to your appetite and or persistent weight loss Schedule your bone density scan when ready  If you are experiencing a Mental Health or Behavioral Health Crisis or need someone to talk to, please call 1-800-273-TALK (toll free, 24 hour hotline)   Patient verbalizes understanding of instructions and care plan provided today and agrees to view in MyChart. Active MyChart status and patient understanding of how to access instructions and care plan via MyChart confirmed with patient.     Delsa Sale, RN, BSN, CCM Care Management Coordinator [facility] Care Management/[facility]  Direct Phone: [phone]

## 2022-03-22 ENCOUNTER — Encounter (INDEPENDENT_AMBULATORY_CARE_PROVIDER_SITE_OTHER): Payer: Medicare PPO | Admitting: Ophthalmology

## 2022-03-22 DIAGNOSIS — I10 Essential (primary) hypertension: Secondary | ICD-10-CM | POA: Diagnosis not present

## 2022-03-22 DIAGNOSIS — E113391 Type 2 diabetes mellitus with moderate nonproliferative diabetic retinopathy without macular edema, right eye: Secondary | ICD-10-CM | POA: Diagnosis not present

## 2022-03-22 DIAGNOSIS — H43813 Vitreous degeneration, bilateral: Secondary | ICD-10-CM | POA: Diagnosis not present

## 2022-03-22 DIAGNOSIS — E113512 Type 2 diabetes mellitus with proliferative diabetic retinopathy with macular edema, left eye: Secondary | ICD-10-CM

## 2022-03-22 DIAGNOSIS — H35033 Hypertensive retinopathy, bilateral: Secondary | ICD-10-CM | POA: Diagnosis not present

## 2022-03-22 DIAGNOSIS — H34812 Central retinal vein occlusion, left eye, with macular edema: Secondary | ICD-10-CM | POA: Diagnosis not present

## 2022-04-09 DIAGNOSIS — E669 Obesity, unspecified: Secondary | ICD-10-CM | POA: Diagnosis not present

## 2022-04-09 DIAGNOSIS — I2510 Atherosclerotic heart disease of native coronary artery without angina pectoris: Secondary | ICD-10-CM | POA: Diagnosis not present

## 2022-04-09 DIAGNOSIS — F32A Depression, unspecified: Secondary | ICD-10-CM

## 2022-04-09 DIAGNOSIS — E1169 Type 2 diabetes mellitus with other specified complication: Secondary | ICD-10-CM

## 2022-04-09 DIAGNOSIS — I10 Essential (primary) hypertension: Secondary | ICD-10-CM | POA: Diagnosis not present

## 2022-04-23 ENCOUNTER — Telehealth: Payer: Self-pay

## 2022-04-23 NOTE — Chronic Care Management (AMB) (Signed)
    Called [facility], No answer, VM too full to leave message of appointment on 04-25-2022 at 3:00via telephone visit with Cherylin Mylar, [address] [facility] CMA Clinical Pharmacist Assistant [phone]

## 2022-04-25 ENCOUNTER — Telehealth: Payer: Medicare PPO

## 2022-05-01 NOTE — Progress Notes (Signed)
This encounter was created in error - please disregard.

## 2022-05-03 ENCOUNTER — Encounter (INDEPENDENT_AMBULATORY_CARE_PROVIDER_SITE_OTHER): Payer: Medicare PPO | Admitting: Ophthalmology

## 2022-05-07 ENCOUNTER — Encounter: Payer: Self-pay | Admitting: Nurse Practitioner

## 2022-05-07 ENCOUNTER — Ambulatory Visit (INDEPENDENT_AMBULATORY_CARE_PROVIDER_SITE_OTHER): Payer: Medicare PPO | Admitting: Nurse Practitioner

## 2022-05-07 VITALS — BP 138/70 | HR 55 | Temp 98.0°F | Ht 60.6 in | Wt 149.0 lb

## 2022-05-07 DIAGNOSIS — F1910 Other psychoactive substance abuse, uncomplicated: Secondary | ICD-10-CM

## 2022-05-07 DIAGNOSIS — F3289 Other specified depressive episodes: Secondary | ICD-10-CM

## 2022-05-07 DIAGNOSIS — E2839 Other primary ovarian failure: Secondary | ICD-10-CM

## 2022-05-07 DIAGNOSIS — Z720 Tobacco use: Secondary | ICD-10-CM

## 2022-05-07 DIAGNOSIS — E669 Obesity, unspecified: Secondary | ICD-10-CM | POA: Diagnosis not present

## 2022-05-07 DIAGNOSIS — F409 Phobic anxiety disorder, unspecified: Secondary | ICD-10-CM

## 2022-05-07 DIAGNOSIS — E1169 Type 2 diabetes mellitus with other specified complication: Secondary | ICD-10-CM

## 2022-05-07 DIAGNOSIS — E559 Vitamin D deficiency, unspecified: Secondary | ICD-10-CM

## 2022-05-07 DIAGNOSIS — E6609 Other obesity due to excess calories: Secondary | ICD-10-CM

## 2022-05-07 DIAGNOSIS — I119 Hypertensive heart disease without heart failure: Secondary | ICD-10-CM | POA: Diagnosis not present

## 2022-05-07 DIAGNOSIS — I2510 Atherosclerotic heart disease of native coronary artery without angina pectoris: Secondary | ICD-10-CM | POA: Diagnosis not present

## 2022-05-07 NOTE — Progress Notes (Signed)
I,Tianna Badgett,acting as a Education administrator for [employer], FNP.,have documented all relevant documentation on the behalf of [name], FNP,as directed by  [name], FNP while in the presence of [name], [place].  Subjective:     Patient ID: [name] , female    DOB: [birth year] , 66 y.o.   MRN: [id number]   Chief Complaint  Patient presents with  . Diabetes  . Hypertension    HPI  Patient presents today for a diabetes and blood pressure f/u.  She has been walking around the house 4 times a day.  She has made an appt with behavioral health substance abuse with [facility] - cigarettes and marijuana. Her niece is now living with her since January. Her last use with marijuana was yesterday.   Today is talking in circles about her niece being in the house with her, states "I don't feel safe for my life". She reports her niece is now on the lease and the patient is having to move at the end of September. She is eating one meal a day due to the types of food her niece buys. She also mentions her refrigerator is not currently working as it should. Denies trying intentionally lose weight. She has also cut back on her carbohydrate intake.   Diabetes She presents for her follow-up diabetic visit. She has type 2 diabetes mellitus. Her disease course has been worsening. There are no hypoglycemic associated symptoms. Pertinent negatives for hypoglycemia include no dizziness or headaches. Pertinent negatives for diabetes include no blurred vision, no chest pain, no fatigue, no polydipsia, no polyphagia and no polyuria. There are no hypoglycemic complications. Symptoms are improving. There are no diabetic complications. Risk factors for coronary artery disease include sedentary lifestyle and obesity. Current diabetic treatment includes oral agent (dual therapy) (15 units Xultophy daily). She is compliant with treatment most of the time. Her weight is stable. She is following a generally healthy diet.  When asked about meal planning, she reported none. She has not had a previous visit with a dietitian. She rarely participates in exercise. (Blood sugar averaging 127 does not check as regularly as she should. ) An ACE inhibitor/angiotensin II receptor blocker is not being taken. Eye exam is not current (she is having eye treatment with Dr. Zigmund Daniel for cataract, Dr.Groat is her opthalmologist).  Hypertension This is a chronic problem. The current episode started more than 1 year ago. The problem is uncontrolled. Pertinent negatives include no blurred vision, chest pain, headaches or palpitations. There are no associated agents to hypertension. Risk factors for coronary artery disease include sedentary lifestyle, obesity and diabetes mellitus.     Past Medical History:  Diagnosis Date  . Anemia 01/07/2020   REQUIRING TRANSFUSION  . Back pain   . Diabetes mellitus without complication ([facility])   . GERD (gastroesophageal reflux disease)   . Hypertension   . Olecranon bursitis of left elbow      Family History  Problem Relation Age of Onset  . Diabetes Mother   . Hypertension Mother   . Diabetes Father   . Hypertension Father   . Breast cancer Sister   . Breast cancer Sister   . Breast cancer Paternal Aunt      Current Outpatient Medications:  .  amLODipine (NORVASC) 5 MG tablet, TAKE 1 TABLET (5 MG TOTAL) BY MOUTH DAILY., Disp: 90 tablet, Rfl: 1 .  atorvastatin (LIPITOR) 10 MG tablet, Take 1 tablet (10 mg total) by mouth daily., Disp: 30 tablet,  Rfl: 11 .  dorzolamide-timolol (COSOPT) 22.3-6.8 MG/ML ophthalmic solution, Place 1 drop into both eyes 2 (two) times daily., Disp: , Rfl:  .  escitalopram (LEXAPRO) 10 MG tablet, TAKE 1 TABLET (10 MG TOTAL) BY MOUTH DAILY., Disp: 30 tablet, Rfl: 3 .  ferrous sulfate 325 (65 FE) MG tablet, Take 1 tablet (325 mg total) by mouth 2 (two) times daily with a meal., Disp: 60 tablet, Rfl: 1 .  glucose blood (ONETOUCH ULTRA) test strip, USE TO CHECK BLOOD  SUGAR ONCE A DAY AS INSTRUCTED, Disp: 100 each, Rfl: 2 .  Lancets (ONETOUCH DELICA PLUS [id number]) MISC, USE TO CHECK BLOOD SUGAR ONCE A DAY AS INSTRUCTED, Disp: 100 each, Rfl: 2 .  nicotine (NICODERM CQ - DOSED IN MG/24 HOURS) 21 mg/24hr patch, Place 1 patch (21 mg total) onto the skin daily., Disp: 30 patch, Rfl: 1 .  valsartan (DIOVAN) 160 MG tablet, TAKE 1 TABLET (160 MG TOTAL) BY MOUTH DAILY., Disp: 90 tablet, Rfl: 1 .  Vitamin D, Ergocalciferol, (DRISDOL) 1.25 MG (50000 UNIT) CAPS capsule, TAKE 1 CAPSULE (50,000 UNITS TOTAL) BY MOUTH EVERY 7 (SEVEN) DAYS., Disp: 12 capsule, Rfl: 0   No Known Allergies   Review of Systems  Constitutional: Negative.  Negative for fatigue.  Eyes:  Negative for blurred vision.  Respiratory: Negative.    Cardiovascular: Negative.  Negative for chest pain and palpitations.  Gastrointestinal: Negative.   Endocrine: Negative for polydipsia, polyphagia and polyuria.  Neurological: Negative.  Negative for dizziness and headaches.  Psychiatric/Behavioral: Negative.       Today's Vitals   05/07/22 1453  BP: 138/70  Pulse: (!) 55  Temp: 98 F (36.7 C)  TempSrc: Oral  Weight: 149 lb (67.6 kg)  Height: 5' 0.6" (1.539 m)   Body mass index is 28.53 kg/m.  Wt Readings from Last 3 Encounters:  05/07/22 149 lb (67.6 kg)  01/01/22 159 lb (72.1 kg)  09/06/21 178 lb (80.7 kg)    Objective:  Physical Exam Vitals reviewed.  Constitutional:      General: She is not in acute distress.    Appearance: Normal appearance.  Cardiovascular:     Rate and Rhythm: Normal rate and regular rhythm.     Pulses: Normal pulses.     Heart sounds: Normal heart sounds. No murmur heard. Pulmonary:     Effort: Pulmonary effort is normal. No respiratory distress.     Breath sounds: Normal breath sounds. No wheezing.  Skin:    Capillary Refill: Capillary refill takes less than 2 seconds.  Neurological:     General: No focal deficit present.     Mental Status: She is alert  and oriented to person, place, and time.     Cranial Nerves: No cranial nerve deficit.     Motor: No weakness.  Psychiatric:        Mood and Affect: Mood normal.        Behavior: Behavior normal.        Thought Content: Thought content normal.        Judgment: Judgment normal.        Assessment And Plan:     1. Diabetes mellitus type 2 in obese [facility]) Comments: HgbA1c is improving and in normal range at last visit, continue current medications. - Hemoglobin A1c - Amb Referral To Provider Referral Exercise Program (P.R.E.P)  2. Hypertensive heart disease without heart failure Comments: Blood pressure is fairly controlled, continue current medications.  - BMP8+EGFR - Amb Referral To Provider Referral  Exercise Program (P.R.E.P)  3. Vitamin D deficiency  4. Atherosclerotic cardiovascular disease Comments: Continue statin, tolerating well. - Lipid panel  5. Decreased estrogen level  6. Tobacco abuse Comments: Chantix was not effective, plans to get nicotine patches and the gum  7. Substance abuse ([place]) Comments: Admits to smoking marijuana and would like to quit, will refer to [facility] and Substance abuse.  - Ambulatory referral to Psychology - Drug Profile, Ur, 9 Drugs  8. Fear for personal safety Comments: spoke with Mannie Stabile at [facility] to make a report. Recommended the patient go to a shelter if she feels unsafe  9. Other depression Comments: Depression screen score 2, continue current medications.     Patient was given opportunity to ask questions. Patient verbalized understanding of the plan and was able to repeat key elements of the plan. All questions were answered to their satisfaction.  [name], FNP   I personally spent 25 minutes face-to-face and non-face-to-face in the care of this patient, which includes all pre-, intra-, and post visit time on the date of service.  Teola Bradley, FNP, have reviewed all documentation for this visit. The  documentation on 05/07/22 for the exam, diagnosis, procedures, and orders are all accurate and complete.   IF YOU HAVE BEEN REFERRED TO A SPECIALIST, IT MAY TAKE 1-2 WEEKS TO SCHEDULE/PROCESS THE REFERRAL. IF YOU HAVE NOT HEARD FROM US/SPECIALIST IN TWO WEEKS, PLEASE GIVE [place] A CALL AT [phone] X [phone].   THE PATIENT IS ENCOURAGED TO PRACTICE SOCIAL DISTANCING DUE TO THE COVID-19 PANDEMIC.

## 2022-05-07 NOTE — Patient Instructions (Addendum)

## 2022-05-08 LAB — LIPID PANEL
Chol/HDL Ratio: 2.5 ratio (ref 0.0–4.4)
Cholesterol, Total: 107 mg/dL (ref 100–199)
HDL: 42 mg/dL (ref >39)
LDL Chol Calc (NIH): 43 mg/dL (ref 0–99)
Triglycerides: 123 mg/dL (ref 0–149)
VLDL Cholesterol Cal: 22 mg/dL (ref 5–40)

## 2022-05-08 LAB — BMP8+EGFR
BUN/Creatinine Ratio: 30 — ABNORMAL HIGH (ref 12–28)
BUN: 21 mg/dL (ref 8–27)
CO2: 24 mmol/L (ref 20–29)
Calcium: 9.5 mg/dL (ref 8.7–10.3)
Chloride: 101 mmol/L (ref 96–106)
Creatinine, Ser: 0.71 mg/dL (ref 0.57–1.00)
Glucose: 84 mg/dL (ref 70–99)
Potassium: 4.5 mmol/L (ref 3.5–5.2)
Sodium: 140 mmol/L (ref 134–144)
eGFR: 94 mL/min/{1.73_m2} (ref >59)

## 2022-05-08 LAB — HEMOGLOBIN A1C
Est. average glucose Bld gHb Est-mCnc: 114 mg/dL
Hgb A1c MFr Bld: 5.6 % (ref 4.8–5.6)

## 2022-05-09 ENCOUNTER — Telehealth: Payer: Self-pay | Admitting: *Deleted

## 2022-05-09 NOTE — Telephone Encounter (Signed)
Called patient in regards to PREP Class referral. Voice mail box full. Unable to leave message.

## 2022-05-16 LAB — DRUG PROFILE, UR, 9 DRUGS (LABCORP)
Amphetamines, Urine: NEGATIVE ng/mL
Barbiturate Quant, Ur: NEGATIVE ng/mL
Benzodiazepine Quant, Ur: NEGATIVE ng/mL
Cannabinoid Quant, Ur: POSITIVE — AB
Cocaine (Metab.): NEGATIVE ng/mL
Methadone Screen, Urine: NEGATIVE ng/mL
Opiate Quant, Ur: NEGATIVE ng/mL
PCP Quant, Ur: NEGATIVE ng/mL
Propoxyphene: NEGATIVE ng/mL

## 2022-05-17 ENCOUNTER — Telehealth (HOSPITAL_COMMUNITY): Payer: Self-pay | Admitting: Licensed Clinical Social Worker

## 2022-05-17 ENCOUNTER — Encounter (HOSPITAL_COMMUNITY): Payer: Self-pay | Admitting: Licensed Clinical Social Worker

## 2022-05-17 NOTE — Telephone Encounter (Signed)
The therapist attempts to reach [place] by phone; however, he is unable to leave a message as her voicemail is full.  Myrna Blazer, MA, LCSW, [facility], LCAS 05/17/2022

## 2022-05-22 ENCOUNTER — Encounter (INDEPENDENT_AMBULATORY_CARE_PROVIDER_SITE_OTHER): Payer: Medicare PPO | Admitting: Ophthalmology

## 2022-05-22 DIAGNOSIS — H34812 Central retinal vein occlusion, left eye, with macular edema: Secondary | ICD-10-CM | POA: Diagnosis not present

## 2022-05-22 DIAGNOSIS — E113512 Type 2 diabetes mellitus with proliferative diabetic retinopathy with macular edema, left eye: Secondary | ICD-10-CM | POA: Diagnosis not present

## 2022-05-22 DIAGNOSIS — H43813 Vitreous degeneration, bilateral: Secondary | ICD-10-CM

## 2022-05-22 DIAGNOSIS — I10 Essential (primary) hypertension: Secondary | ICD-10-CM

## 2022-05-22 DIAGNOSIS — H35033 Hypertensive retinopathy, bilateral: Secondary | ICD-10-CM | POA: Diagnosis not present

## 2022-05-22 DIAGNOSIS — E113391 Type 2 diabetes mellitus with moderate nonproliferative diabetic retinopathy without macular edema, right eye: Secondary | ICD-10-CM

## 2022-05-23 ENCOUNTER — Other Ambulatory Visit: Payer: Self-pay | Admitting: Nurse Practitioner

## 2022-05-23 DIAGNOSIS — F3289 Other specified depressive episodes: Secondary | ICD-10-CM

## 2022-06-02 ENCOUNTER — Encounter: Payer: Self-pay | Admitting: Nurse Practitioner

## 2022-06-07 ENCOUNTER — Other Ambulatory Visit: Payer: Self-pay | Admitting: Nurse Practitioner

## 2022-06-07 DIAGNOSIS — E559 Vitamin D deficiency, unspecified: Secondary | ICD-10-CM

## 2022-06-07 DIAGNOSIS — I10 Essential (primary) hypertension: Secondary | ICD-10-CM

## 2022-06-19 ENCOUNTER — Encounter (INDEPENDENT_AMBULATORY_CARE_PROVIDER_SITE_OTHER): Payer: Medicare PPO | Admitting: Ophthalmology

## 2022-06-19 DIAGNOSIS — H34812 Central retinal vein occlusion, left eye, with macular edema: Secondary | ICD-10-CM

## 2022-06-19 DIAGNOSIS — H43813 Vitreous degeneration, bilateral: Secondary | ICD-10-CM

## 2022-06-19 DIAGNOSIS — I10 Essential (primary) hypertension: Secondary | ICD-10-CM

## 2022-06-19 DIAGNOSIS — H35033 Hypertensive retinopathy, bilateral: Secondary | ICD-10-CM | POA: Diagnosis not present

## 2022-06-19 DIAGNOSIS — E113512 Type 2 diabetes mellitus with proliferative diabetic retinopathy with macular edema, left eye: Secondary | ICD-10-CM | POA: Diagnosis not present

## 2022-06-19 DIAGNOSIS — E113391 Type 2 diabetes mellitus with moderate nonproliferative diabetic retinopathy without macular edema, right eye: Secondary | ICD-10-CM | POA: Diagnosis not present

## 2022-06-22 ENCOUNTER — Other Ambulatory Visit: Payer: Self-pay | Admitting: Nurse Practitioner

## 2022-06-22 DIAGNOSIS — I2510 Atherosclerotic heart disease of native coronary artery without angina pectoris: Secondary | ICD-10-CM

## 2022-07-18 ENCOUNTER — Encounter (INDEPENDENT_AMBULATORY_CARE_PROVIDER_SITE_OTHER): Payer: Medicare PPO | Admitting: Ophthalmology

## 2022-07-18 DIAGNOSIS — E113391 Type 2 diabetes mellitus with moderate nonproliferative diabetic retinopathy without macular edema, right eye: Secondary | ICD-10-CM

## 2022-07-18 DIAGNOSIS — E113512 Type 2 diabetes mellitus with proliferative diabetic retinopathy with macular edema, left eye: Secondary | ICD-10-CM | POA: Diagnosis not present

## 2022-07-18 DIAGNOSIS — I10 Essential (primary) hypertension: Secondary | ICD-10-CM

## 2022-07-18 DIAGNOSIS — H35033 Hypertensive retinopathy, bilateral: Secondary | ICD-10-CM

## 2022-07-18 DIAGNOSIS — H43813 Vitreous degeneration, bilateral: Secondary | ICD-10-CM

## 2022-07-18 DIAGNOSIS — H34812 Central retinal vein occlusion, left eye, with macular edema: Secondary | ICD-10-CM | POA: Diagnosis not present

## 2022-08-16 ENCOUNTER — Ambulatory Visit: Payer: Medicare PPO | Admitting: Nurse Practitioner

## 2022-08-16 VITALS — BP 124/64 | HR 60 | Temp 98.3°F | Ht 60.6 in | Wt 128.0 lb

## 2022-08-16 DIAGNOSIS — R634 Abnormal weight loss: Secondary | ICD-10-CM

## 2022-08-16 DIAGNOSIS — G4709 Other insomnia: Secondary | ICD-10-CM | POA: Diagnosis not present

## 2022-08-16 DIAGNOSIS — Z23 Encounter for immunization: Secondary | ICD-10-CM | POA: Diagnosis not present

## 2022-08-16 DIAGNOSIS — R630 Anorexia: Secondary | ICD-10-CM

## 2022-08-16 DIAGNOSIS — E669 Obesity, unspecified: Secondary | ICD-10-CM | POA: Diagnosis not present

## 2022-08-16 DIAGNOSIS — F3289 Other specified depressive episodes: Secondary | ICD-10-CM

## 2022-08-16 DIAGNOSIS — I2510 Atherosclerotic heart disease of native coronary artery without angina pectoris: Secondary | ICD-10-CM

## 2022-08-16 DIAGNOSIS — E559 Vitamin D deficiency, unspecified: Secondary | ICD-10-CM

## 2022-08-16 DIAGNOSIS — E1169 Type 2 diabetes mellitus with other specified complication: Secondary | ICD-10-CM

## 2022-08-16 DIAGNOSIS — I119 Hypertensive heart disease without heart failure: Secondary | ICD-10-CM

## 2022-08-16 DIAGNOSIS — Z79899 Other long term (current) drug therapy: Secondary | ICD-10-CM

## 2022-08-16 DIAGNOSIS — E2839 Other primary ovarian failure: Secondary | ICD-10-CM

## 2022-08-16 MED ORDER — ESCITALOPRAM OXALATE 20 MG PO TABS: 20.0000 mg | ORAL_TABLET | Freq: Every day | ORAL | 3 refills | Status: DC

## 2022-08-16 NOTE — Patient Instructions (Addendum)
Preventing Consequences of Unhealthy Weight Loss Behaviors, Adult Reaching and maintaining a healthy weight is important for your overall health. A healthy weight will vary from person to person. It is natural to want to lose weight quickly, using whatever methods seem fastest. However, losing weight in a healthy way is not a quick process. Instead, aim for slow, steady weight loss by making small changes and setting achievable goals. How can unhealthy weight loss behaviors affect me? Using unhealthy behaviors to try to lose weight can cause: Tiredness (fatigue), low heart rate, and low blood pressure. Imbalances in your body. These may be imbalances in: Electrolytes. These are salts and minerals in your blood. Chemicals. These are needed so your body can work properly. Body fluids. Loss of fluids may lead to dehydration. Organ damage or organ failure, especially affecting the kidneys. Thin bones that break easily. Loneliness or relationship problems with your friends and family. Emotional problems, including depression and anxiety. Changing unhealthy weight loss behaviors through lifestyle changes will improve your overall health. Maintaining a healthy weight also lowers your risk of certain conditions, such as: Obesity. Heart disease, high cholesterol, and high blood pressure. Type 2 diabetes. Breathing and sleeping disorders. Stroke. Osteoarthritis. This affects your joints. Osteoporosis. This affects your bones. Some cancers. What can increase my risk? Certain views or feelings about yourself and certain habits can increase your risk of unhealthy weight loss behaviors. These include: Having depression and being overweight as an adult. Attempting weight loss as a child or teen. Using alcohol, drugs, or tobacco products. What actions can I take to prevent these behaviors? You can make certain lifestyle changes to help you lose weight in a healthy way. These include eating nutritious  foods and exercising regularly. Nutrition  Eat a variety of healthy foods, including fruits and vegetables, whole grains, lean proteins, and low-fat dairy products. Drink water instead of sugary drinks such as juice, soda, or sports drinks. Drink enough fluid to keep your urine pale yellow. Plan healthy, balanced meals. Work with a dietitian to make a healthy meal plan that works for you. Limit the following: Foods that are high in fat, salt (sodium), or sugar. These include candy, donuts, pizza, and fast foods. Fried or heavily processed foods. Lifestyle Avoid these unhealthy eating habits: Following a diet that restricts entire types of food. This may be a popular diet that promises extreme results in a short time. Skipping meals to save calories. Not eating anything for long periods of time (fasting). Restricting your calories to far fewer than the number that you need to lose or maintain a healthy weight. Taking laxative pills to make you have more frequent bowel movements. Taking medicines to make your body lose excess fluids (diuretics). Eating an excessive amount of food and then making yourself vomit. This is known as bingeing and purging. Do not use any products that contain nicotine or tobacco. These products include cigarettes, chewing tobacco, and vaping devices, such as e-cigarettes. If you need help quitting, ask your health care provider. Alcohol use Do not drink alcohol if: Your health care provider tells you not to drink. You are pregnant, may be pregnant, or are planning to become pregnant. If you drink alcohol: Limit how much you have to: 0-1 drink a day for women. 0-2 drinks a day for men. Know how much alcohol is in your drink. In the [place] one drink equals one 12 oz bottle of beer (355 mL), one 5 oz glass of wine (148 mL), or one 1   oz glass of hard liquor (44 mL). Activity  Avoid compulsively getting an extreme amount of exercise. Work with a Data processing manager to make a  healthy exercise program. Include different types of exercise in your exercise program, such as strengthening, aerobic, and flexibility exercises. To maintain your weight, get at least 150 minutes of moderate-intensity exercise each week. Moderate-intensity exercise could be brisk walking or biking. To lose a healthy amount of weight, get 60 minutes of moderate-intensity exercise each day. Find ways to reduce stress, such as regular exercise or meditation. Find a hobby or other activity that you enjoy to distract you from eating when you feel stressed or bored. Where to find support For more support, talk with: Your health care provider or dietitian. Ask about support groups. A mental health care provider. Family and friends. Where to find more information Learn more about how to prevent complications from unhealthy weight loss behaviors from: Centers for Disease Control and Prevention: FootballExhibition.com.br [employer] of Mental Health: http://www.maynard.net/ National Eating Disorders Association: www.nationaleatingdisorders.org Contact a health care provider if: You often feel very tired. You notice changes in your skin or your hair. You faint because of dehydration or too much exercise. You struggle to change your unhealthy weight loss behaviors on your own. Unhealthy weight loss behaviors are affecting your daily life or your relationships. You have signs or symptoms of an eating disorder. You have major weight changes in a short period of time. You feel guilty or ashamed about eating or exercising. Summary Using unhealthy eating behaviors to try to lose weight can cause a variety of physical and emotional problems that affect your overall health and well-being. Aim for slow, steady weight loss by choosing healthy foods, avoiding unhealthy eating habits, and exercising regularly. Contact your health care provider if you struggle to change your behaviors on your own or if you think that you may  have an eating disorder. This information is not intended to replace advice given to you by your health care provider. Make sure you discuss any questions you have with your health care provider. Document Revised: 04/11/2021 Document Reviewed: 04/11/2021 Elsevier Patient Education  2023 Elsevier Inc.   Call for visit due to your weight loss/colonoscopy DR [facility] [phone]  This is the number to [facility] at [facility]                                          [address] [place] [address]  [phone]

## 2022-08-16 NOTE — Progress Notes (Signed)
I,Tianna Badgett,acting as a Education administrator for [employer], FNP.,have documented all relevant documentation on the behalf of [name], FNP,as directed by  [name], FNP while in the presence of [name], [place].  Subjective:     Patient ID: [name] , female    DOB: 1956/09/06 , 66 y.o.   MRN: [id number]   Chief Complaint  Patient presents with   Weight Loss    HPI  Here with her sister Hosie Poisson who is planning to help with her care. She does not have an appetite since September 12th due to being homeless. She is living by herself. She does smoke marijuana. Her sister is helping with her finances. She is smoking 1 PPD.   Wt Readings from Last 3 Encounters: 08/16/22 : 128 lb (58.1 kg) 05/07/22 : 149 lb (67.6 kg) 01/01/22 : 159 lb (72.1 kg)       Past Medical History:  Diagnosis Date   Anemia 01/07/2020   REQUIRING TRANSFUSION   Back pain    Diabetes mellitus without complication ([facility])    GERD (gastroesophageal reflux disease)    Hypertension    Olecranon bursitis of left elbow      Family History  Problem Relation Age of Onset   Diabetes Mother    Hypertension Mother    Diabetes Father    Hypertension Father    Breast cancer Sister    Breast cancer Sister    Breast cancer Paternal Aunt      Current Outpatient Medications:    amLODipine (NORVASC) 5 MG tablet, TAKE 1 TABLET (5 MG TOTAL) BY MOUTH DAILY., Disp: 90 tablet, Rfl: 1   atorvastatin (LIPITOR) 10 MG tablet, TAKE 1 TABLET (10 MG TOTAL) BY MOUTH DAILY., Disp: 30 tablet, Rfl: 11   dorzolamide-timolol (COSOPT) 22.3-6.8 MG/ML ophthalmic solution, Place 1 drop into both eyes 2 (two) times daily., Disp: , Rfl:    glucose blood (ONETOUCH ULTRA) test strip, USE TO CHECK BLOOD SUGAR ONCE A DAY AS INSTRUCTED, Disp: 100 each, Rfl: 2   Lancets (ONETOUCH DELICA PLUS [id number]) MISC, USE TO CHECK BLOOD SUGAR ONCE A DAY AS INSTRUCTED, Disp: 100 each, Rfl: 2   valsartan (DIOVAN) 160 MG tablet, TAKE 1  TABLET (160 MG TOTAL) BY MOUTH DAILY., Disp: 90 tablet, Rfl: 1   escitalopram (LEXAPRO) 20 MG tablet, Take 1 tablet (20 mg total) by mouth daily., Disp: 30 tablet, Rfl: 3   ferrous sulfate 325 (65 FE) MG tablet, Take 1 tablet (325 mg total) by mouth 2 (two) times daily with a meal., Disp: 60 tablet, Rfl: 1   Vitamin D, Ergocalciferol, (DRISDOL) 1.25 MG (50000 UNIT) CAPS capsule, TAKE 1 CAPSULE (50,000 UNITS TOTAL) BY MOUTH EVERY 7 (SEVEN) DAYS., Disp: 12 capsule, Rfl: 0   No Known Allergies   Review of Systems  Constitutional:  Positive for unexpected weight change.  Respiratory: Negative.    Cardiovascular: Negative.   Gastrointestinal: Negative.   Neurological: Negative.      Today's Vitals   08/16/22 1100  BP: 124/64  Pulse: 60  Temp: 98.3 F (36.8 C)  TempSrc: Oral  Weight: 128 lb (58.1 kg)  Height: 5' 0.6" (1.539 m)   Body mass index is 24.51 kg/m.  Wt Readings from Last 3 Encounters:  08/16/22 128 lb (58.1 kg)  05/07/22 149 lb (67.6 kg)  01/01/22 159 lb (72.1 kg)    Objective:  Physical Exam Vitals reviewed.  Constitutional:      General: She is not in acute distress.  Comments: Weight decrease  Cardiovascular:     Rate and Rhythm: Normal rate and regular rhythm.     Pulses: Normal pulses.     Heart sounds: Normal heart sounds. No murmur heard. Pulmonary:     Effort: Pulmonary effort is normal. No respiratory distress.     Breath sounds: Normal breath sounds. No wheezing.  Skin:    Capillary Refill: Capillary refill takes less than 2 seconds.  Neurological:     General: No focal deficit present.     Mental Status: She is alert and oriented to person, place, and time.     Cranial Nerves: No cranial nerve deficit.     Motor: No weakness.  Psychiatric:        Mood and Affect: Mood normal.        Behavior: Behavior normal.        Thought Content: Thought content normal.        Judgment: Judgment normal.         Assessment And Plan:     1. Diabetes  mellitus type 2 in obese [facility]) Comments: Continue current medications - Hemoglobin A1c - BMP8+EGFR  2. Hypertensive heart disease without heart failure Comments: Blood pressure is well controlled, continue current medications - BMP8+EGFR - DG Bone Density; Future - Flu Vaccine QUAD High Dose(Fluad) - Microalbumin / Creatinine Urine Ratio  3. Vitamin D deficiency Will check vitamin D level and supplement as needed.    Also encouraged to spend 15 minutes in the sun daily.   4. Atherosclerotic cardiovascular disease Comments: Continue statin, tolerating well. - Lipid panel  5. Other depression Comments: I have referred to [facility] however has not gone at this time - escitalopram (LEXAPRO) 20 MG tablet; Take 1 tablet (20 mg total) by mouth daily.  Dispense: 30 tablet; Refill: 3  6. Other long term (current) drug therapy - CBC no Diff  7. Decreased appetite Comments: She is to f/u with GI  8. Other insomnia Comments: Will increase lexapro to see if this helps and refer to psychology to also discuss her substance abuse.  9. Abnormal weight loss Comments: I have referred to GI for further evaluation, advised her sister to check with their office for an appt. She has lost 20 lbs since last visit - HIV Antibody (routine testing w rflx)  10. Decreased estrogen level - DG Bone Density; Future  11. Need for influenza vaccination Influenza vaccine administered Encouraged to take Tylenol as needed for fever or muscle aches. - Flu Vaccine QUAD High Dose(Fluad)     Patient was given opportunity to ask questions. Patient verbalized understanding of the plan and was able to repeat key elements of the plan. All questions were answered to their satisfaction.  [name], FNP    I, [name], FNP, have reviewed all documentation for this visit. The documentation on 08/15/22 for the exam, diagnosis, procedures, and orders are all accurate and complete.  IF YOU HAVE BEEN  REFERRED TO A SPECIALIST, IT MAY TAKE 1-2 WEEKS TO SCHEDULE/PROCESS THE REFERRAL. IF YOU HAVE NOT HEARD FROM US/SPECIALIST IN TWO WEEKS, PLEASE GIVE [place] A CALL AT [phone] X [phone].   THE PATIENT IS ENCOURAGED TO PRACTICE SOCIAL DISTANCING DUE TO THE COVID-19 PANDEMIC.

## 2022-08-17 ENCOUNTER — Other Ambulatory Visit: Payer: Self-pay | Admitting: Nurse Practitioner

## 2022-08-17 DIAGNOSIS — Z1231 Encounter for screening mammogram for malignant neoplasm of breast: Secondary | ICD-10-CM

## 2022-08-17 LAB — HIV ANTIBODY (ROUTINE TESTING W REFLEX): HIV Screen 4th Generation wRfx: NONREACTIVE

## 2022-08-17 LAB — BMP8+EGFR
BUN/Creatinine Ratio: 18 (ref 12–28)
BUN: 14 mg/dL (ref 8–27)
CO2: 25 mmol/L (ref 20–29)
Calcium: 9.4 mg/dL (ref 8.7–10.3)
Chloride: 103 mmol/L (ref 96–106)
Creatinine, Ser: 0.78 mg/dL (ref 0.57–1.00)
Glucose: 80 mg/dL (ref 70–99)
Potassium: 4 mmol/L (ref 3.5–5.2)
Sodium: 141 mmol/L (ref 134–144)
eGFR: 84 mL/min/{1.73_m2} (ref >59)

## 2022-08-17 LAB — HEMOGLOBIN A1C
Est. average glucose Bld gHb Est-mCnc: 126 mg/dL
Hgb A1c MFr Bld: 6 % — ABNORMAL HIGH (ref 4.8–5.6)

## 2022-08-17 LAB — CBC
Hematocrit: 35.4 % (ref 34.0–46.6)
Hemoglobin: 11 g/dL — ABNORMAL LOW (ref 11.1–15.9)
MCH: 23.4 pg — ABNORMAL LOW (ref 26.6–33.0)
MCHC: 31.1 g/dL — ABNORMAL LOW (ref 31.5–35.7)
MCV: 75 fL — ABNORMAL LOW (ref 79–97)
Platelets: 387 10*3/uL (ref 150–450)
RBC: 4.7 x10E6/uL (ref 3.77–5.28)
RDW: 17.5 % — ABNORMAL HIGH (ref 11.7–15.4)
WBC: 11.1 10*3/uL — ABNORMAL HIGH (ref 3.4–10.8)

## 2022-08-17 LAB — LIPID PANEL
Chol/HDL Ratio: 2.5 ratio (ref 0.0–4.4)
Cholesterol, Total: 126 mg/dL (ref 100–199)
HDL: 51 mg/dL (ref >39)
LDL Chol Calc (NIH): 56 mg/dL (ref 0–99)
Triglycerides: 105 mg/dL (ref 0–149)
VLDL Cholesterol Cal: 19 mg/dL (ref 5–40)

## 2022-08-18 LAB — MICROALBUMIN / CREATININE URINE RATIO
Creatinine, Urine: 145.4 mg/dL
Microalb/Creat Ratio: 20 mg/g creat (ref 0–29)
Microalbumin, Urine: 29.1 ug/mL

## 2022-08-21 ENCOUNTER — Encounter (INDEPENDENT_AMBULATORY_CARE_PROVIDER_SITE_OTHER): Payer: Medicare PPO | Admitting: Ophthalmology

## 2022-08-21 DIAGNOSIS — E113512 Type 2 diabetes mellitus with proliferative diabetic retinopathy with macular edema, left eye: Secondary | ICD-10-CM | POA: Diagnosis not present

## 2022-08-21 DIAGNOSIS — I10 Essential (primary) hypertension: Secondary | ICD-10-CM

## 2022-08-21 DIAGNOSIS — I119 Hypertensive heart disease without heart failure: Secondary | ICD-10-CM | POA: Diagnosis not present

## 2022-08-21 DIAGNOSIS — R630 Anorexia: Secondary | ICD-10-CM | POA: Diagnosis not present

## 2022-08-21 DIAGNOSIS — E113311 Type 2 diabetes mellitus with moderate nonproliferative diabetic retinopathy with macular edema, right eye: Secondary | ICD-10-CM | POA: Diagnosis not present

## 2022-08-21 DIAGNOSIS — E559 Vitamin D deficiency, unspecified: Secondary | ICD-10-CM | POA: Diagnosis not present

## 2022-08-21 DIAGNOSIS — H35033 Hypertensive retinopathy, bilateral: Secondary | ICD-10-CM | POA: Diagnosis not present

## 2022-08-21 DIAGNOSIS — H34812 Central retinal vein occlusion, left eye, with macular edema: Secondary | ICD-10-CM

## 2022-08-21 DIAGNOSIS — Z23 Encounter for immunization: Secondary | ICD-10-CM | POA: Diagnosis not present

## 2022-08-21 DIAGNOSIS — H43813 Vitreous degeneration, bilateral: Secondary | ICD-10-CM | POA: Diagnosis not present

## 2022-08-21 DIAGNOSIS — E1169 Type 2 diabetes mellitus with other specified complication: Secondary | ICD-10-CM | POA: Diagnosis not present

## 2022-08-21 DIAGNOSIS — R634 Abnormal weight loss: Secondary | ICD-10-CM | POA: Diagnosis not present

## 2022-08-21 DIAGNOSIS — I2510 Atherosclerotic heart disease of native coronary artery without angina pectoris: Secondary | ICD-10-CM | POA: Diagnosis not present

## 2022-08-21 DIAGNOSIS — G4709 Other insomnia: Secondary | ICD-10-CM | POA: Diagnosis not present

## 2022-08-21 DIAGNOSIS — F3289 Other specified depressive episodes: Secondary | ICD-10-CM | POA: Diagnosis not present

## 2022-08-22 ENCOUNTER — Encounter (INDEPENDENT_AMBULATORY_CARE_PROVIDER_SITE_OTHER): Payer: Medicare PPO | Admitting: Ophthalmology

## 2022-08-22 ENCOUNTER — Other Ambulatory Visit: Payer: Self-pay | Admitting: Nurse Practitioner

## 2022-08-22 DIAGNOSIS — E559 Vitamin D deficiency, unspecified: Secondary | ICD-10-CM

## 2022-08-25 LAB — IRON AND TIBC
Iron Saturation: 17 % (ref 15–55)
Iron: 40 ug/dL (ref 27–139)
Total Iron Binding Capacity: 239 ug/dL — ABNORMAL LOW (ref 250–450)
UIBC: 199 ug/dL (ref 118–369)

## 2022-08-25 LAB — FERRITIN: Ferritin: 69 ng/mL (ref 15–150)

## 2022-08-25 LAB — SPECIMEN STATUS REPORT

## 2022-08-27 ENCOUNTER — Ambulatory Visit
Admission: RE | Admit: 2022-08-27 | Discharge: 2022-08-27 | Disposition: A | Discharge status: Home / Self Care | Payer: Medicare PPO | Source: Ambulatory Visit | Attending: Nurse Practitioner | Admitting: Nurse Practitioner

## 2022-08-27 DIAGNOSIS — Z1231 Encounter for screening mammogram for malignant neoplasm of breast: Secondary | ICD-10-CM | POA: Diagnosis not present

## 2022-08-27 NOTE — Progress Notes (Signed)
She can take it any time of the day. She also needs to f/u with Dr. Loreta Ave for her low hemoglobin and weight loss.

## 2022-09-04 ENCOUNTER — Encounter: Payer: Self-pay | Admitting: Nurse Practitioner

## 2022-09-06 ENCOUNTER — Ambulatory Visit: Payer: Medicare PPO | Admitting: Nurse Practitioner

## 2022-09-12 ENCOUNTER — Ambulatory Visit (INDEPENDENT_AMBULATORY_CARE_PROVIDER_SITE_OTHER): Payer: Medicare PPO

## 2022-09-12 VITALS — Ht 66.0 in | Wt 128.0 lb

## 2022-09-12 DIAGNOSIS — Z0000 Encounter for general adult medical examination without abnormal findings: Secondary | ICD-10-CM

## 2022-09-12 NOTE — Progress Notes (Signed)
I connected with Saaya today by telephone and verified that I am speaking with the correct person using two identifiers. Location patient: home Location provider: work Persons participating in the virtual visit: patient, provider.   I discussed the limitations, risks, security and privacy concerns of performing an evaluation and management service by telephone and the availability of in person appointments. I also discussed with the patient that there may be a patient responsible charge related to this service. The patient expressed understanding and verbally consented to this telephonic visit.    Interactive audio and video telecommunications were attempted between this provider and patient, however failed, due to patient having technical difficulties OR patient did not have access to video capability.  We continued and completed visit with audio only.     Vital signs may be patient reported or missing.  Subjective:   [name] is a 67 y.o. female who presents for an Initial Medicare Annual Wellness Visit.  Review of Systems     Cardiac Risk Factors include: advanced age (>59mn, >>74women);diabetes mellitus;hypertension;smoking/ tobacco exposure     Objective:    Today's Vitals   09/12/22 1142  Weight: 128 lb (58.1 kg)  Height: _0  (1.676 m)   Body mass index is 20.66 kg/m.     09/12/2022   11:52 AM 09/06/2021   10:12 AM 01/06/2020    3:42 PM 11/15/2018    2:50 PM 06/10/2017    3:43 PM 05/10/2017    3:16 PM 04/05/2017   11:47 AM  Advanced Directives  Does Patient Have a Medical Advance Directive? Yes _1  No  Type of Advance Directive [place] Chart? No - copy requested        Would patient like information on creating a medical advance directive?  Yes (MAU/Ambulatory/Procedural Areas - Information given) No - Patient declined No - Patient declined   Yes (ED - Information included in AVS)     Current Medications (verified) Outpatient Encounter Medications as of 09/12/2022  Medication Sig   amLODipine (NORVASC) 5 MG tablet TAKE 1 TABLET (5 MG TOTAL) BY MOUTH DAILY.   aspirin EC 81 MG tablet Take 81 mg by mouth daily. Swallow whole.   atorvastatin (LIPITOR) 10 MG tablet TAKE 1 TABLET (10 MG TOTAL) BY MOUTH DAILY.   dorzolamide-timolol (COSOPT) 22.3-6.8 MG/ML ophthalmic solution Place 1 drop into both eyes 2 (two) times daily.   escitalopram (LEXAPRO) 20 MG tablet Take 1 tablet (20 mg total) by mouth daily.   Multiple Vitamins-Minerals (CENTRUM ADULT PO) Take by mouth.   valsartan (DIOVAN) 160 MG tablet TAKE 1 TABLET (160 MG TOTAL) BY MOUTH DAILY.   Vitamin D, Ergocalciferol, (DRISDOL) 1.25 MG (50000 UNIT) CAPS capsule TAKE 1 CAPSULE (50,000 UNITS TOTAL) BY MOUTH EVERY 7 (SEVEN) DAYS.   ferrous sulfate 325 (65 FE) MG tablet Take 1 tablet (325 mg total) by mouth 2 (two) times daily with a meal.   glucose blood (ONETOUCH ULTRA) test strip USE TO CHECK BLOOD SUGAR ONCE A DAY AS INSTRUCTED (Patient not taking: Reported on 09/12/2022)   Lancets (ONETOUCH DELICA PLUS [id number] MISC USE TO CHECK BLOOD SUGAR ONCE A DAY AS INSTRUCTED (Patient not taking: Reported on 09/12/2022)   No facility-administered encounter medications on file as of 09/12/2022.    Allergies (verified) Patient has no known allergies.   History: Past Medical History:  Diagnosis Date   Anemia  01/07/2020   REQUIRING TRANSFUSION   Back pain    Diabetes mellitus without complication (HCC)    GERD (gastroesophageal reflux disease)    Hypertension    Olecranon bursitis of left elbow    Past Surgical History:  Procedure Laterality Date   ABDOMINAL HYSTERECTOMY     spared ovaries   OLECRANON BURSECTOMY Left 06/19/2017   Procedure: Excision olecranon bursa;  Surgeon: Dorna Leitz, MD;  Location: [facility];  Service: Orthopedics;  Laterality: Left;  Panel Length: 60 mins    TIBIA IM NAIL INSERTION  Right 06/10/2016   Procedure: INTRAMEDULLARY (IM) NAIL TIBIAL RODING;  Surgeon: Dorna Leitz, MD;  Location: [facility];  Service: Orthopedics;  Laterality: Right;   Family History  Problem Relation Age of Onset   Diabetes Mother    Hypertension Mother    Diabetes Father    Hypertension Father    Breast cancer Sister    Breast cancer Sister    Breast cancer Paternal Aunt    Social History   Socioeconomic History   Marital status: Single    Spouse name: Not on file   Number of children: Not on file   Years of education: Not on file   Highest education level: Not on file  Occupational History   Occupation: retired  Tobacco Use   Smoking status: Every Day    Packs/day: 0.50    Years: 30.00    Total pack years: 15.00    Types: Cigarettes   Smokeless tobacco: Never   Tobacco comments:    started back in 2012, smoking 1/2 PPD - 05/07/2022  Vaping Use   Vaping Use: Never used  Substance and Sexual Activity   Alcohol use: No    Comment: opccasionally    Drug use: No   Sexual activity: Yes  Other Topics Concern   Not on file  Social History Narrative   Not on file   Social Determinants of Health   Financial Resource Strain: Low Risk  (09/12/2022)   Overall Financial Resource Strain (CARDIA)    Difficulty of Paying Living Expenses: Not hard at all  Food Insecurity: Food Insecurity Present (09/12/2022)   Hunger Vital Sign    Worried About [place] in the Last Year: Sometimes true    Ran Out of Food in the Last Year: Sometimes true  Transportation Needs: No Transportation Needs (09/12/2022)   PRAPARE - Hydrologist (Medical): No    Lack of Transportation (Non-Medical): No  Physical Activity: Inactive (09/12/2022)   Exercise Vital Sign    Days of Exercise per Week: 0 days    Minutes of Exercise per Session: 0 min  Stress: No Stress Concern Present (09/12/2022)   [place]     Feeling of Stress : Not at all  Social Connections: Moderately Integrated (09/06/2021)   Social Connection and Isolation Panel [NHANES]    Frequency of Communication with Friends and Family: More than three times a week    Frequency of Social Gatherings with Friends and Family: More than three times a week    Attends Religious Services: More than 4 times per year    Active Member of [employer] or Organizations: Yes    Attends Music therapist: More than 4 times per year    Marital Status: Divorced    Tobacco Counseling Ready to quit: Yes Counseling given: Not Answered Tobacco comments: started back in 2012, smoking 1/2  PPD - 05/07/2022   Clinical Intake:  Pre-visit preparation completed: Yes  Pain : No/denies pain     Nutritional Status: BMI of 19-24  Normal Nutritional Risks: Nausea/ vomitting/ diarrhea (diarrhea a couple days last week, resolved) Diabetes: Yes  How often do you need to have someone help you when you read instructions, pamphlets, or other written materials from your doctor or pharmacy?: 1 - Never  Diabetic? Yes Nutrition Risk Assessment:  Has the patient had any N/V/D within the last 2 months?  Yes  Does the patient have any non-healing wounds?  No  Has the patient had any unintentional weight loss or weight gain?  No   Diabetes:  Is the patient diabetic?  Yes  If diabetic, was a CBG obtained today?  No  Did the patient bring in their glucometer from home?  No  How often do you monitor your CBG's? Does not.   Financial Strains and Diabetes Management:  Are you having any financial strains with the device, your supplies or your medication? No .  Does the patient want to be seen by Chronic Care Management for management of their diabetes?  No  Would the patient like to be referred to a Nutritionist or for Diabetic Management?  No   Diabetic Exams:  Diabetic Eye Exam: Overdue for diabetic eye exam. Pt has been advised about the importance in  completing this exam. Patient advised to call and schedule an eye exam. Diabetic Foot Exam: Completed 01/01/2022   Interpreter Needed?: No  Information entered by :: NAllen LPN   Activities of Daily Living    09/12/2022   11:57 AM  In your present state of health, do you have any difficulty performing the following activities:  Hearing? 0  Vision? 1  Comment due cataracts  Difficulty concentrating or making decisions? 0  Walking or climbing stairs? 1  Dressing or bathing? 0  Doing errands, shopping? 0  Preparing Food and eating ? N  Using the Toilet? N  In the past six months, have you accidently leaked urine? Y  Do you have problems with loss of bowel control? N  Managing your Medications? N  Managing your Finances? N  Housekeeping or managing your Housekeeping? N    Patient Care Team: [name], FNP as PCP - General (General Practice)  Indicate any recent Medical Services you may have received from other than [facility] providers in the past year (date may be approximate).     Assessment:   This is a routine wellness examination for Lexany.  Hearing/Vision screen Vision Screening - Comments:: Regular eye exams, Dr. Zigmund Daniel  Dietary issues and exercise activities discussed: Current Exercise Habits: The patient does not participate in regular exercise at present   Goals Addressed             This Visit's Progress    Patient Stated       09/12/2022, wants to gain weight and stop smoking       Depression Screen    09/12/2022   11:54 AM 08/16/2022   11:04 AM 05/07/2022    3:32 PM 09/06/2021   10:15 AM 09/06/2021    9:44 AM 07/11/2021    9:37 AM 08/11/2020    9:14 AM  PHQ 2/9 Scores  PHQ - 2 Score 0 _0 0 2 0  PHQ- 9 Score  _1 Fall Risk    09/12/2022   11:53 AM 08/16/2022   11:04 AM  05/07/2022    2:59 PM 09/06/2021   10:14 AM 09/06/2021    9:50 AM  Fall Risk   Falls in the past year? 0 0 0 0 0  Number falls in past yr: 0 0 0 0 0  Injury  with Fall? 0 0 0 0 0  Risk for fall due to : Impaired balance/gait;Impaired mobility No Fall Risks No Fall Risks    Follow up Falls prevention discussed;Education provided;Falls evaluation completed Falls evaluation completed Falls evaluation completed      FALL RISK PREVENTION PERTAINING TO THE HOME:  Any stairs in or around the home? Yes  If so, are there any without handrails? No  Home free of loose throw rugs in walkways, pet beds, electrical cords, etc? Yes  Adequate lighting in your home to reduce risk of falls? Yes   ASSISTIVE DEVICES UTILIZED TO PREVENT FALLS:  Life alert? No  Use of a cane, walker or w/c? Yes  Grab bars in the bathroom? No  Shower chair or bench in shower? Yes  Elevated toilet seat or a handicapped toilet? Yes   TIMED UP AND GO:  Was the test performed? No .      Cognitive Function:    09/06/2021    9:32 AM  MMSE - Mini Mental State Exam  Orientation to time 5  Orientation to Place 5  Registration 3  Attention/ Calculation 5  Recall 3  Language- name 2 objects 2  Language- repeat 1  Language- follow 3 step command 3  Language- read & follow direction 1  Write a sentence 1  Copy design 1  Total score 30        09/12/2022   12:00 PM 09/06/2021    9:28 AM  6CIT Screen  What Year? 0 points 0 points  What month? 0 points 0 points  What time? 0 points 0 points  Count back from 20 0 points 0 points  Months in reverse 0 points 0 points  Repeat phrase 0 points 4 points  Total Score 0 points 4 points    Immunizations Immunization History  Administered Date(s) Administered   Fluad Quad(high Dose 65+) 08/21/2022   Influenza, High Dose Seasonal PF 09/06/2021   Influenza,inj,Quad PF,6+ Mos 07/28/2019, 08/11/2020   PFIZER(Purple Top)SARS-COV-2 Vaccination 11/16/2019, 12/11/2019, 07/30/2020   PNEUMOCOCCAL CONJUGATE-20 01/01/2022   Tdap 09/06/2021    TDAP status: Up to date  Flu Vaccine status: Up to date  Pneumococcal vaccine status:  Up to date  Covid-19 vaccine status: Completed vaccines  Qualifies for Shingles Vaccine? Yes   Zostavax completed No   Shingrix Completed?: No.    Education has been provided regarding the importance of this vaccine. Patient has been advised to call insurance company to determine out of pocket expense if they have not yet received this vaccine. Advised may also receive vaccine at local pharmacy or Health Dept. Verbalized acceptance and understanding.  Screening Tests Health Maintenance  Topic Date Due   Zoster Vaccines- Shingrix (1 of 2) Never done   COLONOSCOPY (Pts 45-48yr Insurance coverage will need to be confirmed)  Never done   OPHTHALMOLOGY EXAM  02/09/2021   DEXA SCAN  Never done   COVID-19 Vaccine (4 - 2023-24 season) 05/11/2022   FOOT EXAM  01/02/2023   Lung Cancer Screening  01/25/2023   HEMOGLOBIN A1C  02/15/2023   Diabetic kidney evaluation - eGFR measurement  08/17/2023   Diabetic kidney evaluation - Urine ACR  08/17/2023   Medicare Annual Wellness (AWV)  09/13/2023   MAMMOGRAM  08/27/2024   DTaP/Tdap/Td (2 - Td or Tdap) 09/07/2031   Pneumonia Vaccine 41+ Years old  Completed   INFLUENZA VACCINE  Completed   Hepatitis C Screening  Completed   HPV VACCINES  Aged Out    Health Maintenance  Health Maintenance Due  Topic Date Due   Zoster Vaccines- Shingrix (1 of 2) Never done   COLONOSCOPY (Pts 45-29yr Insurance coverage will need to be confirmed)  Never done   OPHTHALMOLOGY EXAM  02/09/2021   DEXA SCAN  Never done   COVID-19 Vaccine (4 - 2023-24 season) 05/11/2022    Colorectal cancer screening: due  Mammogram status: Completed 08/27/2022. Repeat every year  Bone Density status: scheduled for 02/06/2023  Lung Cancer Screening: (Low Dose CT Chest recommended if Age 67-80years, 30 pack-year currently smoking OR have quit w/in 15years.) does qualify.   Lung Cancer Screening Referral: 01/24/2022  Additional Screening:  Hepatitis C Screening: does qualify;  Completed 01/12/2020  Vision Screening: Recommended annual ophthalmology exams for early detection of glaucoma and other disorders of the eye. Is the patient up to date with their annual eye exam?  Yes  Who is the provider or what is the name of the office in which the patient attends annual eye exams? Dr. MZigmund DanielIf pt is not established with a provider, would they like to be referred to a provider to establish care? No .   Dental Screening: Recommended annual dental exams for proper oral hygiene  Community Resource Referral / Chronic Care Management: CRR required this visit?  No   CCM required this visit?  No      Plan:     I have personally reviewed and noted the following in the patient's chart:   Medical and social history Use of alcohol, tobacco or illicit drugs  Current medications and supplements including opioid prescriptions. Patient is not currently taking opioid prescriptions. Functional ability and status Nutritional status Physical activity Advanced directives List of other physicians Hospitalizations, surgeries, and ER visits in previous 12 months Vitals Screenings to include cognitive, depression, and falls Referrals and appointments  In addition, I have reviewed and discussed with patient certain preventive protocols, quality metrics, and best practice recommendations. A written personalized care plan for preventive services as well as general preventive health recommendations were provided to patient.     NKellie Simmering LPN   [id number]/[id number]  Nurse Notes: Due to this being a virtual visit, the after visit summary with patients personalized plan was offered to patient via mail or my-chart.  Patient would like to access on my-chart

## 2022-09-12 NOTE — Patient Instructions (Addendum)
[name] , Thank you for taking time to come for your Medicare Wellness Visit. I appreciate your ongoing commitment to your health goals. Please review the following plan we discussed and let me know if I can assist you in the future.   These are the goals we discussed:  Goals      Manage My Medicine     Timeframe:  Long-Range Goal Priority:  High Start Date:                             Expected End Date:                       Follow Up Date 11/22/2021   - call for medicine refill 2 or 3 days before it runs out - call if I am sick and can't take my medicine - keep a list of all the medicines I take; vitamins and herbals too - use a pillbox to sort medicine - use an alarm clock or phone to remind me to take my medicine    Why is this important?   These steps will help you keep on track with your medicines.   Notes:  Please call if you have any questions or concerns about your medications.      Patient Stated     09/12/2022, wants to gain weight and stop smoking        This is a list of the screening recommended for you and due dates:  Health Maintenance  Topic Date Due   Zoster (Shingles) Vaccine (1 of 2) Never done   Colon Cancer Screening  Never done   Eye exam for diabetics  02/09/2021   DEXA scan (bone density measurement)  Never done   COVID-19 Vaccine (4 - 2023-24 season) 05/11/2022   Complete foot exam   01/02/2023   Screening for Lung Cancer  01/25/2023   Hemoglobin A1C  02/15/2023   Yearly kidney function blood test for diabetes  08/17/2023   Yearly kidney health urinalysis for diabetes  08/17/2023   Medicare Annual Wellness Visit  09/13/2023   Mammogram  08/27/2024   DTaP/Tdap/Td vaccine (2 - Td or Tdap) 09/07/2031   Pneumonia Vaccine  Completed   Flu Shot  Completed   Hepatitis C Screening: USPSTF Recommendation to screen - Ages 62-79 yo.  Completed   HPV Vaccine  Aged Out    Advanced directives: Please bring a copy of your POA (Power of [name])  and/or Living Will to your next appointment.   Conditions/risks identified: smoking  Next appointment: Follow up in one year for your annual wellness visit    Preventive Care 54 Years and Older, Female Preventive care refers to lifestyle choices and visits with your health care provider that can promote health and wellness. What does preventive care include? A yearly physical exam. This is also called an annual well check. Dental exams once or twice a year. Routine eye exams. Ask your health care provider how often you should have your eyes checked. Personal lifestyle choices, including: Daily care of your teeth and gums. Regular physical activity. Eating a healthy diet. Avoiding tobacco and drug use. Limiting alcohol use. Practicing safe sex. Taking low-dose aspirin every day. Taking vitamin and mineral supplements as recommended by your health care provider. What happens during an annual well check? The services and screenings done by your health care provider during your annual well check will depend on  your age, overall health, lifestyle risk factors, and family history of disease. Counseling  Your health care provider may ask you questions about your: Alcohol use. Tobacco use. Drug use. Emotional well-being. Home and relationship well-being. Sexual activity. Eating habits. History of falls. Memory and ability to understand (cognition). Work and work Astronomer. Reproductive health. Screening  You may have the following tests or measurements: Height, weight, and BMI. Blood pressure. Lipid and cholesterol levels. These may be checked every 5 years, or more frequently if you are over 77 years old. Skin check. Lung cancer screening. You may have this screening every year starting at age 65 if you have a 30-pack-year history of smoking and currently smoke or have quit within the past 15 years. Fecal occult blood test (FOBT) of the stool. You may have this test every year  starting at age 17. Flexible sigmoidoscopy or colonoscopy. You may have a sigmoidoscopy every 5 years or a colonoscopy every 10 years starting at age 75. Hepatitis C blood test. Hepatitis B blood test. Sexually transmitted disease (STD) testing. Diabetes screening. This is done by checking your blood sugar (glucose) after you have not eaten for a while (fasting). You may have this done every 1-3 years. Bone density scan. This is done to screen for osteoporosis. You may have this done starting at age 35. Mammogram. This may be done every 1-2 years. Talk to your health care provider about how often you should have regular mammograms. Talk with your health care provider about your test results, treatment options, and if necessary, the need for more tests. Vaccines  Your health care provider may recommend certain vaccines, such as: Influenza vaccine. This is recommended every year. Tetanus, diphtheria, and acellular pertussis (Tdap, Td) vaccine. You may need a Td booster every 10 years. Zoster vaccine. You may need this after age 88. Pneumococcal 13-valent conjugate (PCV13) vaccine. One dose is recommended after age 7. Pneumococcal polysaccharide (PPSV23) vaccine. One dose is recommended after age 49. Talk to your health care provider about which screenings and vaccines you need and how often you need them. This information is not intended to replace advice given to you by your health care provider. Make sure you discuss any questions you have with your health care provider. Document Released: 09/23/2015 Document Revised: 05/16/2016 Document Reviewed: 06/28/2015 Elsevier Interactive Patient Education  2017 [employer].  Fall Prevention in the Home Falls can cause injuries. They can happen to people of all ages. There are many things you can do to make your home safe and to help prevent falls. What can I do on the outside of my home? Regularly fix the edges of walkways and driveways and fix any  cracks. Remove anything that might make you trip as you walk through a door, such as a raised step or threshold. Trim any bushes or trees on the path to your home. Use bright outdoor lighting. Clear any walking paths of anything that might make someone trip, such as rocks or tools. Regularly check to see if handrails are loose or broken. Make sure that both sides of any steps have handrails. Any raised decks and porches should have guardrails on the edges. Have any leaves, snow, or ice cleared regularly. Use sand or salt on walking paths during winter. Clean up any spills in your garage right away. This includes oil or grease spills. What can I do in the bathroom? Use night lights. Install grab bars by the toilet and in the tub and shower. Do not  use towel bars as grab bars. Use non-skid mats or decals in the tub or shower. If you need to sit down in the shower, use a plastic, non-slip stool. Keep the floor dry. Clean up any water that spills on the floor as soon as it happens. Remove soap buildup in the tub or shower regularly. Attach bath mats securely with double-sided non-slip rug tape. Do not have throw rugs and other things on the floor that can make you trip. What can I do in the bedroom? Use night lights. Make sure that you have a light by your bed that is easy to reach. Do not use any sheets or blankets that are too big for your bed. They should not hang down onto the floor. Have a firm chair that has side arms. You can use this for support while you get dressed. Do not have throw rugs and other things on the floor that can make you trip. What can I do in the kitchen? Clean up any spills right away. Avoid walking on wet floors. Keep items that you use a lot in easy-to-reach places. If you need to reach something above you, use a strong step stool that has a grab bar. Keep electrical cords out of the way. Do not use floor polish or wax that makes floors slippery. If you must  use wax, use non-skid floor wax. Do not have throw rugs and other things on the floor that can make you trip. What can I do with my stairs? Do not leave any items on the stairs. Make sure that there are handrails on both sides of the stairs and use them. Fix handrails that are broken or loose. Make sure that handrails are as long as the stairways. Check any carpeting to make sure that it is firmly attached to the stairs. Fix any carpet that is loose or worn. Avoid having throw rugs at the top or bottom of the stairs. If you do have throw rugs, attach them to the floor with carpet tape. Make sure that you have a light switch at the top of the stairs and the bottom of the stairs. If you do not have them, ask someone to add them for you. What else can I do to help prevent falls? Wear shoes that: Do not have high heels. Have rubber bottoms. Are comfortable and fit you well. Are closed at the toe. Do not wear sandals. If you use a stepladder: Make sure that it is fully opened. Do not climb a closed stepladder. Make sure that both sides of the stepladder are locked into place. Ask someone to hold it for you, if possible. Clearly mark and make sure that you can see: Any grab bars or handrails. First and last steps. Where the edge of each step is. Use tools that help you move around (mobility aids) if they are needed. These include: Canes. Walkers. Scooters. Crutches. Turn on the lights when you go into a dark area. Replace any light bulbs as soon as they burn out. Set up your furniture so you have a clear path. Avoid moving your furniture around. If any of your floors are uneven, fix them. If there are any pets around you, be aware of where they are. Review your medicines with your doctor. Some medicines can make you feel dizzy. This can increase your chance of falling. Ask your doctor what other things that you can do to help prevent falls. This information is not intended to replace  advice  given to you by your health care provider. Make sure you discuss any questions you have with your health care provider. Document Released: 06/23/2009 Document Revised: 02/02/2016 Document Reviewed: 10/01/2014 Elsevier Interactive Patient Education  2017 [employer].

## 2022-09-21 ENCOUNTER — Ambulatory Visit: Payer: Self-pay

## 2022-09-21 NOTE — Patient Instructions (Signed)
Visit Information  Thank you for taking time to visit with me today. Please don't hesitate to contact me if I can be of assistance to you.   Following are the goals we discussed today:   Goals Addressed             This Visit's Progress    COMPLETED: Care Coordination Activities       Care Coordination Interventions: SDoH screening performed to assess for care coordination needs -  discussed the patient still uses [place] for transportation. She is accessing her church for food insecurity when needed. Noted patient does have stable housing at this time but did experience homelessness over the fall Performed chart review to note patient lost 20 pounds since being homeless in September Patient reports she now has stable housing and is beginning to eat more now that her Lexapro has been increased "food looks good to me now since my medicine was increased" Education on role of LCSW provided to the patient - patient is interested in engaging with Christa See. Appointment scheduled 1/18 1:00 pm Discussed opportunity to engage with [place] to address clinical needs related to weight loss and comorbidities; appointment scheduled for 1/23 at 11:30 Encouraged the patient to contact SW as needed Collaboration with colleagues Barb Merino RN Care Manager and Christa See LCSW regarding interventions and plan         Your next appointment is by telephone on 09/27/22 at 1:00 pm with Christa See LCSW  Please call the care guide team at [phone] if you need to cancel or reschedule your appointment.   If you are experiencing a Mental Health or [place] or need someone to talk to, please call 911  Patient verbalizes understanding of instructions and care plan provided today and agrees to view in [place]. Active MyChart status and patient understanding of how to access instructions and care plan via MyChart confirmed with patient.     Telephone  follow up appointment with care management team member scheduled for:1/18 at 1:00 pm and 1/23 at 11:30.  Daneen Schick, BSW, CDP Social Worker, Certified Dementia Practitioner [facility]  Care Coordination [phone]

## 2022-09-21 NOTE — Patient Outreach (Signed)
  Care Coordination   09/21/2022 Name: [name] MRN: [id number] DOB: 1955-11-28   Care Coordination Outreach Attempts:  An unsuccessful telephone outreach was attempted today to offer the patient information about available care coordination services as a benefit of their health plan.   Follow Up Plan:  Additional outreach attempts will be made to offer the patient care coordination information and services.   Encounter Outcome:  No Answer   Care Coordination Interventions:  No, not indicated    Daneen Schick, BSW, CDP Social Worker, Certified Dementia Practitioner [facility]  Care Coordination [phone]

## 2022-09-21 NOTE — Patient Outreach (Signed)
  Care Coordination   Initial Visit Note   09/21/2022 Name: [name] MRN: [id number] DOB: Dec 09, 1955  [name] is a 67 y.o. year old female who sees [name], [place] for primary care. I spoke with  [name] by phone today.  What matters to the patients health and wellness today?  I would like to speak with someone about depression    Goals Addressed             This Visit's Progress    COMPLETED: Care Coordination Activities       Care Coordination Interventions: SDoH screening performed to assess for care coordination needs -  discussed the patient still uses [place] for transportation. She is accessing her church for food insecurity when needed. Noted patient does have stable housing at this time but did experience homelessness over the fall Performed chart review to note patient lost 20 pounds since being homeless in September Patient reports she now has stable housing and is beginning to eat more now that her Lexapro has been increased "food looks good to me now since my medicine was increased" Education on role of LCSW provided to the patient - patient is interested in engaging with [name]. Appointment scheduled 1/18 1:00 pm Discussed opportunity to engage with [place] to address clinical needs related to weight loss and comorbidities; appointment scheduled for 1/23 at 11:30 Encouraged the patient to contact SW as needed Collaboration with [place] RN Care Manager and Kristin See LCSW regarding interventions and plan         SDOH assessments and interventions completed:  Yes  SDOH Interventions Today    Flowsheet Row Most Recent Value  SDOH Interventions   Food Insecurity Interventions Other (Comment)  [pt utilizes church for food support when needed]  Housing Interventions Intervention Not Indicated  [patient was homeless over the fall of 2023 but currently has housing stability]   Transportation Interventions Other (Comment)  [place] Coordination Interventions:  Yes, provided   Follow up plan: Follow up call scheduled for 09/27/22 with Kristin See LCSW    Encounter Outcome:  Pt. Visit Completed   Daneen Schick, Arita Miss, CDP Social Worker, Certified Dementia Practitioner [facility] Coordination [phone]

## 2022-09-25 ENCOUNTER — Encounter (INDEPENDENT_AMBULATORY_CARE_PROVIDER_SITE_OTHER): Payer: Medicare PPO | Admitting: Ophthalmology

## 2022-09-27 ENCOUNTER — Ambulatory Visit: Payer: Self-pay | Admitting: Licensed Clinical Social Worker

## 2022-10-02 ENCOUNTER — Ambulatory Visit: Payer: Self-pay

## 2022-10-02 NOTE — Patient Instructions (Signed)
Visit Information  Thank you for taking time to visit with me today. Please don't hesitate to contact me if I can be of assistance to you.   Following are the goals we discussed today:   Goals Addressed             This Visit's Progress    Stress Management   On track    Care Coordination Interventions: Solution-Focused Strategies employed:  Active listening / Reflection utilized  Emotional Support Provided Provided psychoeducation for mental health needs  Verbalization of feelings encouraged  Patient reports that she fell last night; however, denied injury Patient is concerned about alleged adverse side effects from Lexapro (legs are swollen and puffy, no pain) Patient identified triggers to stress, including, strained relationship with a family member Patient reports strong support from siblings and church family Validation and encouragement provided Patient endorses med compliance. She is happy that her appetite has increased         Our next appointment is by telephone on 10/11/22 at 3 PM  Please call the care guide team at [phone] if you need to cancel or reschedule your appointment.   If you are experiencing a Mental Health or [place] or need someone to talk to, please call the Suicide and Crisis Lifeline: 988 call 911   Patient verbalizes understanding of instructions and care plan provided today and agrees to view in [place]. Active MyChart status and patient understanding of how to access instructions and care plan via MyChart confirmed with patient.     Christa See, MSW, [email or web address] Phone [phone] 5:40 PM

## 2022-10-02 NOTE — Patient Instructions (Signed)
Visit Information  Thank you for taking time to visit with me today. Please don't hesitate to contact me if I can be of assistance to you.   Following are the goals we discussed today:   Goals Addressed               This Visit's Progress     Patient Stated     To have the swelling in my legs evaluated (pt-stated)        Care Coordination Interventions: Evaluation of current treatment plan related to hypertension self management and patient's adherence to plan as established by provider Reviewed medications with patient and discussed importance of compliance Advised patient, providing education and rationale, to monitor blood pressure daily and record, calling PCP for findings outside established parameters Advised patient to discuss leg swelling  with provider Provided education on prescribed diet low Sodium Sent in basket message to PCP [name] FNP regarding patient's c/o of lower extremity edema Educated patient regarding the importance of staying well hydrated with water Mailed printed educational material related to limiting sodium Reviewed scheduled/upcoming provider appointment including: next PCP follow up appointment scheduled for 12/17/22 @11 :20 AM         To have weight loss and anemia evaluated (pt-stated)        Care Coordination Interventions: Evaluation of current treatment plan related to abnormal weight loss with low blood count and patient's adherence to plan as established by provider Determined patient feels she is regaining her weight back, reports prior weight loss occurred when she was homeless, severely depressed and not eating Discussed patient has relocated in a new home and is self preparing meals and her appetite is very good at this time Discussed patient will weigh herself during her church visits when a scale is assessable Reviewed and discussed PCP recommendation for patient to f/u with Dr. Collene Mares, GI to have symptoms evaluated Placed joint call  with patient to Dr. Lorie Apley office, spoke with rep who stated Dr. Collene Mares will not be able to see this patient Sent in basket message to PCP [name] FNP regarding need for new GI referral            Our next appointment is by telephone on 10/16/22 at 12 PM  Please call the care guide team at [phone] if you need to cancel or reschedule your appointment.   If you are experiencing a Mental Health or [place] or need someone to talk to, please go to [facility] [address], [place] [phone])  Patient verbalizes understanding of instructions and care plan provided today and agrees to view in [place]. Active MyChart status and patient understanding of how to access instructions and care plan via MyChart confirmed with patient.     Barb Merino, RN, BSN, CCM Care Management Coordinator [facility] Care Management Direct Phone: [phone]

## 2022-10-02 NOTE — Patient Outreach (Signed)
  Care Coordination Late Entry  Initial Visit Note   Outreach completed 09/27/22 Name: [name] MRN: [id number] DOB: 1956/07/27  [name] is a 67 y.o. year old female who sees [name], [place] for primary care. I spoke with  [name] by phone today.  What matters to the patients health and wellness today?  Stress Management    Goals Addressed             This Visit's Progress    Stress Management   On track    Care Coordination Interventions: Solution-Focused Strategies employed:  Active listening / Reflection utilized  Emotional Support Provided Provided psychoeducation for mental health needs  Verbalization of feelings encouraged  Patient reports that she fell last night; however, denied injury Patient is concerned about alleged adverse side effects from Lexapro (legs are swollen and puffy, no pain) Patient identified triggers to stress, including, strained relationship with a family member Patient reports strong support from siblings and church family Validation and encouragement provided Patient endorses med compliance. She is happy that her appetite has increased         SDOH assessments and interventions completed:  No     Care Coordination Interventions:  Yes, provided   Follow up plan: Follow up call scheduled for 2-4 weeks    Encounter Outcome:  Pt. Visit Completed   [name], MSW, [name] Phone [phone] 5:39 PM

## 2022-10-02 NOTE — Patient Outreach (Signed)
  Care Coordination   Follow Up Visit Note   10/02/2022 Name: [name] MRN: [id number] DOB: 02-22-1956  [name] is a 67 y.o. year old female who sees [name], [place] for primary care. I spoke with  [name] by phone today.  What matters to the patients health and wellness today?  Patient is concerned about increased swelling to her legs.     Goals Addressed               This Visit's Progress     Patient Stated     To have the swelling in my legs evaluated (pt-stated)        Care Coordination Interventions: Evaluation of current treatment plan related to hypertension self management and patient's adherence to plan as established by provider Reviewed medications with patient and discussed importance of compliance Advised patient, providing education and rationale, to monitor blood pressure daily and record, calling PCP for findings outside established parameters Advised patient to discuss leg swelling  with provider Provided education on prescribed diet low Sodium Sent in basket message to PCP [name] FNP regarding patient's c/o of lower extremity edema Educated patient regarding the importance of staying well hydrated with water Mailed printed educational material related to limiting sodium Reviewed scheduled/upcoming provider appointment including: next PCP follow up appointment scheduled for 12/17/22 @11 :20 AM         To have weight loss and anemia evaluated (pt-stated)        Care Coordination Interventions: Evaluation of current treatment plan related to abnormal weight loss with low blood count and patient's adherence to plan as established by provider Determined patient feels she is regaining her weight back, reports prior weight loss occurred when she was homeless, severely depressed and not eating Discussed patient has relocated in a new home and is self preparing meals and her appetite is very good at this time Discussed patient will  weigh herself during her church visits when a scale is assessable Reviewed and discussed PCP recommendation for patient to f/u with Dr. Collene Mares, GI to have symptoms evaluated Placed joint call with patient to Dr. Lorie Apley office, spoke with rep who stated Dr. Collene Mares will not be able to see this patient Sent in basket message to PCP [name] FNP regarding need for new GI referral            SDOH assessments and interventions completed:  No     Care Coordination Interventions:  Yes, provided   Follow up plan: Follow up call scheduled for 10/16/22 @12  PM    Encounter Outcome:  Pt. Visit Completed

## 2022-10-04 ENCOUNTER — Encounter (INDEPENDENT_AMBULATORY_CARE_PROVIDER_SITE_OTHER): Payer: Medicare PPO | Admitting: Ophthalmology

## 2022-10-04 DIAGNOSIS — H35033 Hypertensive retinopathy, bilateral: Secondary | ICD-10-CM

## 2022-10-04 DIAGNOSIS — E113512 Type 2 diabetes mellitus with proliferative diabetic retinopathy with macular edema, left eye: Secondary | ICD-10-CM | POA: Diagnosis not present

## 2022-10-04 DIAGNOSIS — H43813 Vitreous degeneration, bilateral: Secondary | ICD-10-CM

## 2022-10-04 DIAGNOSIS — H34812 Central retinal vein occlusion, left eye, with macular edema: Secondary | ICD-10-CM

## 2022-10-04 DIAGNOSIS — E113311 Type 2 diabetes mellitus with moderate nonproliferative diabetic retinopathy with macular edema, right eye: Secondary | ICD-10-CM | POA: Diagnosis not present

## 2022-10-04 DIAGNOSIS — I10 Essential (primary) hypertension: Secondary | ICD-10-CM | POA: Diagnosis not present

## 2022-10-11 ENCOUNTER — Ambulatory Visit: Payer: Self-pay | Admitting: Licensed Clinical Social Worker

## 2022-10-12 NOTE — Patient Instructions (Signed)
Visit Information  Thank you for taking time to visit with me today. Please don't hesitate to contact me if I can be of assistance to you.   Following are the goals we discussed today:   Goals Addressed             This Visit's Progress    Stress Management   On track    Care Coordination Interventions: Solution-Focused Strategies employed:  Active listening / Reflection utilized  Emotional Support Provided Provided psychoeducation for mental health needs  Verbalization of feelings encouraged  Patient reports that she fell last night; however, denied injury Patient is concerned about alleged adverse side effects from Lexapro (legs are swollen and puffy, no pain) Patient identified triggers to stress, including, strained relationship with a family member Patient reports strong support from siblings and church family Validation and encouragement provided Patient endorses med compliance. She is happy that her appetite has increased         Our next appointment is by telephone on 10/30/22 at 3:30 PM  Please call the care guide team at [phone] if you need to cancel or reschedule your appointment.   If you are experiencing a Mental Health or [place] or need someone to talk to, please call the Suicide and Crisis Lifeline: 988 call 911   Patient verbalizes understanding of instructions and care plan provided today and agrees to view in [place]. Active MyChart status and patient understanding of how to access instructions and care plan via MyChart confirmed with patient.     Christa See, MSW, [email or web address] Phone [phone] 8:55 AM

## 2022-10-12 NOTE — Patient Outreach (Signed)
  Care Coordination   Follow Up Visit Note   10/11/22 Name: [name] MRN: [id number] DOB: [birth year]  [name] is a 67 y.o. year old female who sees [name], [place] for primary care. I spoke with  [name] by phone today.  What matters to the patients health and wellness today?  Symptom Management    Goals Addressed             This Visit's Progress    Stress Management   On track    Care Coordination Interventions: Solution-Focused Strategies employed:  Active listening / Reflection utilized  Emotional Support Provided Provided psychoeducation for mental health needs  Verbalization of feelings encouraged  Patient reports that she fell last night; however, denied injury Patient is concerned about alleged adverse side effects from Lexapro (legs are swollen and puffy, no pain) Patient identified triggers to stress, including, strained relationship with a family member Patient reports strong support from siblings and church family Validation and encouragement provided Patient endorses med compliance. She is happy that her appetite has increased         SDOH assessments and interventions completed:  No     Care Coordination Interventions:  Yes, provided   Follow up plan: Follow up call scheduled for 2 weeks    Encounter Outcome:  Pt. Visit Completed   Christa See, MSW, [email or web address] Phone [phone] 8:54 AM

## 2022-10-16 ENCOUNTER — Ambulatory Visit: Payer: Self-pay

## 2022-10-16 ENCOUNTER — Other Ambulatory Visit: Payer: Self-pay | Admitting: Nurse Practitioner

## 2022-10-16 DIAGNOSIS — R634 Abnormal weight loss: Secondary | ICD-10-CM

## 2022-10-16 DIAGNOSIS — D528 Other folate deficiency anemias: Secondary | ICD-10-CM

## 2022-10-16 DIAGNOSIS — Z1211 Encounter for screening for malignant neoplasm of colon: Secondary | ICD-10-CM

## 2022-10-16 NOTE — Patient Outreach (Signed)
  Care Coordination   Follow Up Visit Note   10/16/2022 Name: VLASTA BASKIN MRN: [id number] DOB: May 05, 1956  KLOIE WHITING is a 67 y.o. year old female who sees [name], [place] for primary care. I spoke with  [name] by phone today.  What matters to the patients health and wellness today?  Patient would like to f/u with a GI specialist to have her abnormal weight and low blood count evaluated. Patient would like to have reduced swelling to her feet/legs.     Goals Addressed               This Visit's Progress     Patient Stated     To have the swelling in my legs evaluated (pt-stated)        Care Coordination Interventions: Evaluation of current treatment plan related to hypertension/lower leg edema and self management and patient's adherence to plan as established by provider Determined patient is wearing compression socks and this has reduced her swelling Educated patient on when to wear the compression socks while providing rationale on how this intervention can promote circulation and reduce edema and skin breakdown, DVT, etc... Educated patient while providing rationale on the risk for impaired circulation due to smoking tobacco  Instructed patient to perform daily feet/leg inspections and report abnormal changes promptly to her doctor          To have weight loss and anemia evaluated (pt-stated)        Care Coordination Interventions: Evaluation of current treatment plan related to abnormal weight loss with low blood count and patient's adherence to plan as established by provider Determined PCP did not place a new GI referral  Collaborated with PCP regarding GI referral is still needed to evaluate abnormal weight loss and low blood count Determined PCP plans to enter a new GI referral, patient advised and educated on referral process Encouraged patient to continue to eat small frequent meals and monitor weight for ongoing weight loss           SDOH assessments and interventions completed:  No     Care Coordination Interventions:  Yes, provided   Follow up plan: Follow up call scheduled for 10/31/22 @09 :30 AM    Encounter Outcome:  Pt. Visit Completed

## 2022-10-16 NOTE — Patient Instructions (Signed)
Visit Information  Thank you for taking time to visit with me today. Please don't hesitate to contact me if I can be of assistance to you.   Following are the goals we discussed today:   Goals Addressed               This Visit's Progress     Patient Stated     To have the swelling in my legs evaluated (pt-stated)        Care Coordination Interventions: Evaluation of current treatment plan related to hypertension/lower leg edema and self management and patient's adherence to plan as established by provider Determined patient is wearing compression socks and this has reduced her swelling Educated patient on when to wear the compression socks while providing rationale on how this intervention can promote circulation and reduce edema and skin breakdown, DVT, etc... Educated patient while providing rationale on the risk for impaired circulation due to smoking tobacco  Instructed patient to perform daily feet/leg inspections and report abnormal changes promptly to her doctor          To have weight loss and anemia evaluated (pt-stated)        Care Coordination Interventions: Evaluation of current treatment plan related to abnormal weight loss with low blood count and patient's adherence to plan as established by provider Determined PCP did not place a new GI referral  Collaborated with PCP regarding GI referral is still needed to evaluate abnormal weight loss and low blood count Determined PCP plans to enter a new GI referral, patient advised and educated on referral process Encouraged patient to continue to eat small frequent meals and monitor weight for ongoing weight loss          Our next appointment is by telephone on 10/31/22 at 09:30 AM  Please call the care guide team at [phone] if you need to cancel or reschedule your appointment.   If you are experiencing a Mental Health or [place] or need someone to talk to, please call 1-800-273-TALK (toll free,  24 hour hotline) go to [facility] [address], [place] [phone])  Patient verbalizes understanding of instructions and care plan provided today and agrees to view in [place]. Active MyChart status and patient understanding of how to access instructions and care plan via MyChart confirmed with patient.     Barb Merino, RN, BSN, CCM Care Management Coordinator [facility] Care Management  Direct Phone: [phone]

## 2022-10-19 ENCOUNTER — Telehealth: Payer: Self-pay

## 2022-10-19 NOTE — Telephone Encounter (Signed)
Called re: PREP program referral, left voicemail

## 2022-10-22 ENCOUNTER — Telehealth: Payer: Self-pay

## 2022-10-22 ENCOUNTER — Ambulatory Visit: Payer: Self-pay

## 2022-10-22 DIAGNOSIS — D509 Iron deficiency anemia, unspecified: Secondary | ICD-10-CM

## 2022-10-22 DIAGNOSIS — I10 Essential (primary) hypertension: Secondary | ICD-10-CM

## 2022-10-22 DIAGNOSIS — M545 Low back pain, unspecified: Secondary | ICD-10-CM

## 2022-10-22 NOTE — Telephone Encounter (Signed)
Returned her call, explained PREP she would like to attend, will need to work out transportation to [employer]; message to [employer], her CM; will try to arrange transportation to attend March class at [place], 3/12, every T/Th 10-11:15

## 2022-10-22 NOTE — Patient Outreach (Signed)
  Care Coordination   Follow Up Visit Note   10/22/2022 Name: [name] MRN: [id number] DOB: 05/31/1956  [name] is a 67 y.o. year old female who sees [name], [place] for primary care. I spoke with Karl Bales RN BSN health coach for the PREP team.   What matters to the patients health and wellness today?  Patient would like to participate in PREP, she will need help with transportation.     Goals Addressed             This Visit's Progress    To start the PREP program       Care Coordination Interventions: Collaborated with Karl Bales health coach with the [place] team Determined patient will be able to start PREP on 11/20/22 twice weekly for 12 weeks, she will attend at the [facility] in [place], she will need help with transportation Discussed with Zacarias Pontes, sending a referral to the [place] to assist patient with resources for transportation           SDOH assessments and interventions completed:  No     Care Coordination Interventions:  Yes, provided   Follow up plan: Follow up call scheduled for 10/31/22 @09 :30 AM    Encounter Outcome:  Pt. Visit Completed

## 2022-10-23 ENCOUNTER — Telehealth: Payer: Self-pay | Admitting: *Deleted

## 2022-10-23 NOTE — Telephone Encounter (Signed)
   Telephone encounter was:  Unsuccessful.  10/23/2022 Name: ORTHA METTS MRN: [id number] DOB: 1956-04-19  Unsuccessful outbound call made today to assist with:  Transportation Needs   Outreach Attempt:  1st Attempt  A HIPAA compliant voice message was left requesting a return call.  Instructed patient to call back at [phone] .[place] [phone] [address] , [place] [address] Email : [email or web address]

## 2022-10-25 ENCOUNTER — Telehealth: Payer: Self-pay | Admitting: *Deleted

## 2022-10-25 NOTE — Telephone Encounter (Signed)
   Telephone encounter was:  Unsuccessful.  10/25/2022 Name: [name] MRN: [id number] DOB: 1956/02/13  Unsuccessful outbound call made today to assist with:  Transportation Needs  2nd call to notify patient of tranportation for prep program will send [place] Access application and also [facility] transportation [phone]  Outreach Attempt:  2nd Attempt  A HIPAA compliant voice message was left requesting a return call.  Instructed patient to call back at [phone].  [place] [phone] [address] , [place] [address] Email : [email or web address]

## 2022-10-26 ENCOUNTER — Telehealth: Payer: Self-pay | Admitting: *Deleted

## 2022-10-26 NOTE — Telephone Encounter (Signed)
   Telephone encounter was:  Unsuccessful.  10/26/2022 Name: [name] MRN: [id number] DOB: [birth year]  Unsuccessful outbound call made today to assist with:  Transportation Needs   Outreach Attempt:  3rd Attempt.  Referral closed unable to contact patient.  A HIPAA compliant voice message was left requesting a return call.  Instructed patient to call back at [phone].  [place] [phone] [address] , [place] [address] Email : [email or web address]

## 2022-10-30 ENCOUNTER — Ambulatory Visit: Payer: Self-pay | Admitting: Licensed Clinical Social Worker

## 2022-10-31 ENCOUNTER — Ambulatory Visit: Payer: Self-pay

## 2022-10-31 NOTE — Patient Outreach (Signed)
  Care Coordination   10/31/2022 Name: [name] MRN: [id number] DOB: Jul 14, 1956   Care Coordination Outreach Attempts:  An unsuccessful telephone outreach was attempted for a scheduled appointment today.  Follow Up Plan:  Additional outreach attempts will be made to offer the patient care coordination information and services.   Encounter Outcome:  No Answer   Care Coordination Interventions:  No, not indicated    Barb Merino, RN, BSN, CCM Care Management Coordinator [facility] Care Management Direct Phone: [phone]

## 2022-11-01 ENCOUNTER — Other Ambulatory Visit: Payer: Self-pay | Admitting: Nurse Practitioner

## 2022-11-01 DIAGNOSIS — Z1211 Encounter for screening for malignant neoplasm of colon: Secondary | ICD-10-CM

## 2022-11-01 NOTE — Patient Instructions (Signed)
Visit Information  Thank you for taking time to visit with me today. Please don't hesitate to contact me if I can be of assistance to you.   Following are the goals we discussed today:   Goals Addressed             This Visit's Progress    Stress Management   On track    Care Coordination Interventions: Solution-Focused Strategies employed:  Active listening / Reflection utilized  Emotional Support Provided Provided psychoeducation for mental health needs  Verbalization of feelings encouraged  Patient reports that she fell last night; however, denied injury Patient is concerned about alleged adverse side effects from Lexapro (legs are swollen and puffy, no pain) Patient identified triggers to stress, including, strained relationship with a family member Patient reports strong support from siblings and church family Validation and encouragement provided Patient endorses med compliance. She is happy that her appetite has increased         Our next appointment is by telephone on 11/13/22 at 3:30 PM  Please call the care guide team at [phone] if you need to cancel or reschedule your appointment.   If you are experiencing a Mental Health or [place] or need someone to talk to, please call the Suicide and Crisis Lifeline: 988 call 911   Patient verbalizes understanding of instructions and care plan provided today and agrees to view in [place]. Active MyChart status and patient understanding of how to access instructions and care plan via MyChart confirmed with patient.     Christa See, MSW, [email or web address] Phone [phone] 11:59 AM

## 2022-11-01 NOTE — Patient Outreach (Signed)
  Care Coordination Late Entry  Follow Up Visit Note   10/30/22 Name: [name] MRN: [id number] DOB: Jan 06, 1956  [name] is a 67 y.o. year old female who sees [name], [name] for primary care. I spoke with  [name] by phone today.  What matters to the patients health and wellness today?  Health management    Goals Addressed             This Visit's Progress    Stress Management   On track    Care Coordination Interventions: Solution-Focused Strategies employed:  Active listening / Reflection utilized  Emotional Support Provided Provided psychoeducation for mental health needs  Verbalization of feelings encouraged  Patient reports that she fell last night; however, denied injury Patient is concerned about alleged adverse side effects from Lexapro (legs are swollen and puffy, no pain) Patient identified triggers to stress, including, strained relationship with a family member Patient reports strong support from siblings and church family Validation and encouragement provided Patient endorses med compliance. She is happy that her appetite has increased         SDOH assessments and interventions completed:  No     Care Coordination Interventions:  Yes, provided   Follow up plan: Follow up call scheduled for 2 weeks    Encounter Outcome:  Pt. Visit Completed   [name], MSW, [name] Phone [phone] 11:57 AM

## 2022-11-08 ENCOUNTER — Encounter (INDEPENDENT_AMBULATORY_CARE_PROVIDER_SITE_OTHER): Payer: Medicare PPO | Admitting: Ophthalmology

## 2022-11-12 ENCOUNTER — Telehealth: Payer: Self-pay

## 2022-11-12 NOTE — Telephone Encounter (Signed)
Called to confirm participation in next PREP class at [place] on March 19; left voicemail requesting return call.

## 2022-11-13 ENCOUNTER — Encounter: Payer: Self-pay | Admitting: Licensed Clinical Social Worker

## 2022-11-13 ENCOUNTER — Telehealth: Payer: Self-pay | Admitting: Licensed Clinical Social Worker

## 2022-11-13 NOTE — Patient Outreach (Signed)
  Care Coordination   11/13/2022 Name: NIAYA KRIMMEL MRN: [id number] DOB: [birth year]   Care Coordination Outreach Attempts:  An unsuccessful telephone outreach was attempted for a scheduled appointment today.  Follow Up Plan:  Additional outreach attempts will be made to offer the patient care coordination information and services.   Encounter Outcome:  No Answer   Care Coordination Interventions:  No, not indicated    Christa See, MSW, [email or web address] Phone [phone] 3:54 PM

## 2022-11-14 ENCOUNTER — Telehealth: Payer: Self-pay | Admitting: Licensed Clinical Social Worker

## 2022-11-15 NOTE — Patient Instructions (Signed)
Visit Information  Thank you for taking time to visit with me today. Please don't hesitate to contact me if I can be of assistance to you.   Following are the goals we discussed today:   Goals Addressed             This Visit's Progress    Stress Management   On track    Activities and task to complete in order to accomplish goals.   Keep all upcoming appointment discussed today Continue with compliance of taking medication prescribed by Doctor Continue utilizing healthy coping skills to assist with management of stress            Our next appointment is by telephone on 4/3 at 3:30 PM  Please call the care guide team at [phone] if you need to cancel or reschedule your appointment.   If you are experiencing a Mental Health or [place] or need someone to talk to, please call the Suicide and Crisis Lifeline: 988 call 911   Patient verbalizes understanding of instructions and care plan provided today and agrees to view in [place]. Active MyChart status and patient understanding of how to access instructions and care plan via MyChart confirmed with patient.     Christa See, MSW, [email or web address] Phone [phone] 11:00 AM

## 2022-11-15 NOTE — Patient Outreach (Signed)
  Care Coordination   Follow Up Visit Note   11/14/22 Name: BUSRA DELAUGHTER MRN: [id number] DOB: 05-23-1956  CORISHA WESTLING is a 67 y.o. year old female who sees [name], [place] for primary care. I spoke with  [name] by phone today.  What matters to the patients health and wellness today?  Symptom Management/Transportation    Goals Addressed             This Visit's Progress    Stress Management   On track    Care Coordination Interventions: Solution-Focused Strategies employed:  Active listening / Reflection utilized  Emotional Support Provided Provided psychoeducation for mental health needs  Verbalization of feelings encouraged  Patient reports that she fell last night; however, denied injury Patient is concerned about alleged adverse side effects from Lexapro (legs are swollen and puffy, no pain) Patient identified triggers to stress, including, strained relationship with a family member Patient reports strong support from siblings and church family Validation and encouragement provided Patient endorses med compliance. She is happy that her appetite has increased         SDOH assessments and interventions completed:  No     Care Coordination Interventions:  Yes, provided  Interventions Today    Flowsheet Row Most Recent Value  Chronic Disease   Chronic disease during today's visit Hypertension (HTN)  General Interventions   General Interventions Discussed/Reviewed General Interventions Reviewed, [employer] reviewed upcoming appts. Discussed transportation resources]  [place] Discussed/Reviewed Mental Health Reviewed, Coping Strategies, Depression  [Pt identified strengths and discussed plan to establish boundaries]       Follow up plan: Follow up call scheduled for 4-6 weeks    Encounter Outcome:  Pt. Visit Completed   Christa See, MSW, [email or web address] Phone [phone] 11:00 AM

## 2022-11-19 ENCOUNTER — Telehealth: Payer: Self-pay

## 2022-11-19 NOTE — Telephone Encounter (Signed)
She had returned my call re: PREP leaving VM; called her back and will refer her to [facility] for class schedule there, may need help with transportation and [facility].

## 2022-11-20 ENCOUNTER — Other Ambulatory Visit: Payer: Self-pay | Admitting: Nurse Practitioner

## 2022-11-20 DIAGNOSIS — F3289 Other specified depressive episodes: Secondary | ICD-10-CM

## 2022-11-26 ENCOUNTER — Emergency Department (HOSPITAL_COMMUNITY): Payer: Medicare PPO

## 2022-11-26 ENCOUNTER — Encounter (INDEPENDENT_AMBULATORY_CARE_PROVIDER_SITE_OTHER): Payer: Medicare PPO | Admitting: Ophthalmology

## 2022-11-26 ENCOUNTER — Emergency Department (HOSPITAL_COMMUNITY)
Admission: EM | Admit: 2022-11-26 | Discharge: 2022-11-26 | Disposition: A | Discharge status: Home / Self Care | Payer: Medicare PPO | Attending: Emergency Medicine | Admitting: Emergency Medicine

## 2022-11-26 ENCOUNTER — Encounter (INDEPENDENT_AMBULATORY_CARE_PROVIDER_SITE_OTHER): Payer: Self-pay

## 2022-11-26 DIAGNOSIS — M7989 Other specified soft tissue disorders: Secondary | ICD-10-CM | POA: Diagnosis not present

## 2022-11-26 DIAGNOSIS — Z7982 Long term (current) use of aspirin: Secondary | ICD-10-CM | POA: Diagnosis not present

## 2022-11-26 DIAGNOSIS — E119 Type 2 diabetes mellitus without complications: Secondary | ICD-10-CM | POA: Diagnosis not present

## 2022-11-26 DIAGNOSIS — I10 Essential (primary) hypertension: Secondary | ICD-10-CM | POA: Diagnosis not present

## 2022-11-26 DIAGNOSIS — W010XXA Fall on same level from slipping, tripping and stumbling without subsequent striking against object, initial encounter: Secondary | ICD-10-CM | POA: Insufficient documentation

## 2022-11-26 DIAGNOSIS — M25572 Pain in left ankle and joints of left foot: Secondary | ICD-10-CM | POA: Diagnosis not present

## 2022-11-26 DIAGNOSIS — Z794 Long term (current) use of insulin: Secondary | ICD-10-CM | POA: Diagnosis not present

## 2022-11-26 DIAGNOSIS — Z79899 Other long term (current) drug therapy: Secondary | ICD-10-CM | POA: Diagnosis not present

## 2022-11-26 DIAGNOSIS — M79672 Pain in left foot: Secondary | ICD-10-CM | POA: Diagnosis not present

## 2022-11-26 DIAGNOSIS — W19XXXA Unspecified fall, initial encounter: Secondary | ICD-10-CM

## 2022-11-26 NOTE — ED Triage Notes (Signed)
Patient here for evaluation of left ankle pain that started two weeks ago after slipping while pulling a rug. Patient states pain is exacerbated by walking. Patient states she initially was still going to work after the fall but now the pain is starting to get worse.

## 2022-11-26 NOTE — Discharge Instructions (Signed)
Return to the ED with any new or worsening signs or symptoms Please follow-up with your PCP for reevaluation of left ankle Please continue taking ibuprofen every 6 hours as needed for pain.  Please continue taking Epsom salt baths.  Please continue wearing ankle brace as needed for walking. Please read the attached guide concerning ankle pain Please remember to take your blood pressure medication every morning

## 2022-11-26 NOTE — ED Provider Notes (Signed)
[place] Provider Note   CSN: [id number] Arrival date & time: 11/26/22  1038     History  Chief Complaint  Patient presents with   [name]    [name] is a 67 y.o. female with medical history anemia, diabetes, GERD, hypertension.  Patient presents to ED for evaluation of fall.  Patient reports that 2 weeks ago on 3/4 she had a ground-level fall secondary to tripping on a rug.  Patient reports that she landed on her right hip at that time.  Patient states that ever since falling 2 weeks ago she has had progressive left foot and ankle pain.  Patient denies falling onto her left ankle or foot during her initial fall.  The patient reports she has been using her rollabout walker at home to help ambulate.  Patient states that she has been controlling pain at home with ibuprofen.  Patient reports pain is located to the medial portion of her ankle.  Patient denies any lateral left foot pain.  Patient denies hitting her head, losing consciousness during fall.  Patient denies blood thinners.  Patient denies headache, neck pain, back pain.   Fall       Home Medications Prior to Admission medications   Medication Sig Start Date End Date Taking? Authorizing Provider  amLODipine (NORVASC) 5 MG tablet TAKE 1 TABLET (5 MG TOTAL) BY MOUTH DAILY. 06/07/22   [name], FNP  aspirin EC 81 MG tablet Take 81 mg by mouth daily. Swallow whole.    [provider]  atorvastatin (LIPITOR) 10 MG tablet TAKE 1 TABLET (10 MG TOTAL) BY MOUTH DAILY. 06/22/22 06/22/23  [name], FNP  dorzolamide-timolol (COSOPT) 22.3-6.8 MG/ML ophthalmic solution Place 1 drop into both eyes 2 (two) times daily. 03/27/21   [provider]  escitalopram (LEXAPRO) 20 MG tablet TAKE 1 TABLET (20 MG TOTAL) BY MOUTH DAILY. 11/22/22 11/22/23  [name], FNP  ferrous sulfate 325 (65 FE) MG tablet Take 1 tablet (325 mg total) by mouth 2 (two) times daily with a  meal. 01/08/20 01/01/22  Darliss Cheney, MD  glucose blood (ONETOUCH ULTRA) test strip USE TO CHECK BLOOD SUGAR ONCE A DAY AS INSTRUCTED Patient not taking: Reported on 09/12/2022 01/01/22   [name], FNP  Lancets (ONETOUCH DELICA PLUS [id number]) [place] USE TO [place] A DAY AS INSTRUCTED Patient not taking: Reported on 09/12/2022 11/07/20   [name], FNP  Multiple Vitamins-Minerals (CENTRUM ADULT PO) Take by mouth.    [provider]  valsartan (DIOVAN) 160 MG tablet TAKE 1 TABLET (160 MG TOTAL) BY MOUTH DAILY. 06/22/22   [name], FNP  Vitamin D, Ergocalciferol, (DRISDOL) 1.25 MG (50000 UNIT) CAPS capsule TAKE 1 CAPSULE (50,000 UNITS TOTAL) BY MOUTH EVERY 7 (SEVEN) DAYS. 08/22/22   [name], FNP      Allergies    Patient has no known allergies.    Review of Systems   Review of Systems  Musculoskeletal:  Positive for arthralgias.  All other systems reviewed and are negative.   Physical Exam Updated Vital Signs BP (!) 210/84 (BP Location: Right Arm)   Pulse (!) 52   Temp 98.1 F (36.7 C) (Oral)   Resp 16   SpO2 100%  Physical Exam Vitals and nursing note reviewed.  Constitutional:      General: She is not in acute distress.    Appearance: Normal appearance. She is not ill-appearing, toxic-appearing or diaphoretic.  HENT:  Head: Normocephalic and atraumatic.     Nose: Nose normal.     Mouth/Throat:     Mouth: Mucous membranes are moist.     Pharynx: Oropharynx is clear.  Eyes:     Extraocular Movements: Extraocular movements intact.     Conjunctiva/sclera: Conjunctivae normal.     Pupils: Pupils are equal, round, and reactive to light.  Cardiovascular:     Rate and Rhythm: Normal rate and regular rhythm.  Pulmonary:     Effort: Pulmonary effort is normal.     Breath sounds: Normal breath sounds. No wheezing.  Abdominal:     General: Abdomen is flat. Bowel sounds are normal.     Palpations: Abdomen is soft.     Tenderness: There is no  abdominal tenderness.  Musculoskeletal:     Cervical back: Normal range of motion and neck supple.     Left ankle: No swelling or deformity. No tenderness. Normal range of motion.     Comments: Patient left ankle full range of motion actively and passively.  No obvious deformity.  Base of left fifth metatarsal palpated without findings of tenderness.  No overlying skin change, deformity, ecchymosis or bruising.  Skin:    General: Skin is warm and dry.     Capillary Refill: Capillary refill takes less than 2 seconds.  Neurological:     Mental Status: She is alert and oriented to person, place, and time.     ED Results / Procedures / Treatments   Labs (all labs ordered are listed, but only abnormal results are displayed) Labs Reviewed - No data to display  EKG None  Radiology DG Ankle Complete Left  Result Date: 11/26/2022 CLINICAL DATA:  Pain after fall. EXAM: LEFT ANKLE COMPLETE - 3+ VIEW; LEFT FOOT - COMPLETE 3+ VIEW COMPARISON:  None Available. FINDINGS: Diffuse soft tissue swelling. Severe osteopenia. Well corticated plantar and Achilles calcaneal spurs. There is deformity of the neck of the fifth metatarsal. This could be subacute. Please correlate to point tenderness. IMPRESSION: Deformity of the neck of the fifth metatarsal. This could be subacute fracture. Please correlate for any known history or any prior x-ray. Osteopenia with soft tissue swelling.  Calcaneal spurs Electronically Signed   By: Jill Side M.D.   On: 11/26/2022 11:46   DG Foot Complete Left  Result Date: 11/26/2022 CLINICAL DATA:  Pain after fall. EXAM: LEFT ANKLE COMPLETE - 3+ VIEW; LEFT FOOT - COMPLETE 3+ VIEW COMPARISON:  None Available. FINDINGS: Diffuse soft tissue swelling. Severe osteopenia. Well corticated plantar and Achilles calcaneal spurs. There is deformity of the neck of the fifth metatarsal. This could be subacute. Please correlate to point tenderness. IMPRESSION: Deformity of the neck of the fifth  metatarsal. This could be subacute fracture. Please correlate for any known history or any prior x-ray. Osteopenia with soft tissue swelling.  Calcaneal spurs Electronically Signed   By: Jill Side M.D.   On: 11/26/2022 11:46    Procedures Procedures   Medications Ordered in ED Medications - No data to display  ED Course/ Medical Decision Making/ A&P  Medical Decision Making Amount and/or Complexity of Data Reviewed Radiology: ordered.   67 year old female presents to the ED for evaluation.  Please see HPI for further details.  On examination patient afebrile and nontachycardic.  Lung sounds clear bilaterally, not hypoxic.  Abdomen soft and compressible throughout.  Patient left ankle has no overlying skin change, obvious deformity.  The patient left ankle is full range of motion.  Plain  film imaging of patient left ankle and foot does show deformity at the neck of the fifth metatarsal which could be subacute.  On examination the patient has no tenderness to the base of her left fifth metatarsal.  The patient has no overlying skin change.  The patient reports that her pain is actually in the medial part of her upper ankle.  Patient reports that she is not taking NSAIDs, using an ankle splint, resting and taking Epsom salt baths.  Patient reports that all these treatments are providing her relief of pain.  At this time, doubt patient has left fifth metatarsal acute fracture as there is no tenderness palpation.  Patient left ankle has full range of motion.  At this time, patient advised to continue with conservative treatment at home.  Patient advised to continue utilizing ankle brace.  Patient will be advised to follow-up with her PCP for reevaluation.  The patient was advised to return to the ED with any new or worsening signs or symptoms and the patient voiced understanding.  Patient had all of her questions answered to her satisfaction.  Patient stable for discharge.  Update: Prior to  discharge, patient blood pressure noted to be elevated 210/84.  Patient states that she has not taken her blood pressure medication today.  Patient was provided water to take BP meds with.   Final Clinical Impression(s) / ED Diagnoses Final diagnoses:  Fall, initial encounter  Acute left ankle pain    Rx / DC Orders ED Discharge Orders     None         Lawana Chambers 11/26/22 1251    Isla Pence, MD 11/26/22 1416

## 2022-11-26 NOTE — ED Provider Triage Note (Signed)
Emergency Medicine Provider Triage Evaluation Note  QUANIYAH LADD , a 67 y.o. female  was evaluated in triage.  Pt complains of left ankle pain after falling on 3/5.  She states she has not been evaluated for this yet.  She reports increased pain with walking to the diffuse ankle and foot.  She denies hitting her head or losing consciousness.  She denies a history of problems with this ankle..  Review of Systems  Positive: See HPI Negative: See HPI  Physical Exam  BP (!) 210/84 (BP Location: Right Arm)   Pulse (!) 52   Temp 98.1 F (36.7 C) (Oral)   Resp 16   SpO2 100%  Gen:   Awake, no distress   Resp:  Normal effort  MSK:   Moves extremities without difficulty  Other:  No localizable tenderness to the left left ankle or foot, no swelling, 2+ DP and PT pulse, normal sensation, range of motion appears intact, soft compartments  Medical Decision Making  Medically screening exam initiated at 10:53 AM.  Appropriate orders placed.  Jacquiline Purinton Bara-Hart was informed that the remainder of the evaluation will be completed by another provider, this initial triage assessment does not replace that evaluation, and the importance of remaining in the ED until their evaluation is complete.     Suzzette Righter, PA-C 11/26/22 1057

## 2022-11-30 ENCOUNTER — Telehealth: Payer: Self-pay

## 2022-11-30 NOTE — Telephone Encounter (Signed)
        Patient  visited [facility] [place] on 11/26/2022  for fall, left ankle pain.   Telephone encounter attempt :  1st  A HIPAA compliant voice message was left requesting a return call.  Instructed patient to call back at [phone].   [place] Resource Care Guide   [email or web address]  ?? [id number]   Website: [email or web address]  [email or web address]

## 2022-12-03 ENCOUNTER — Telehealth: Payer: Self-pay

## 2022-12-03 NOTE — Telephone Encounter (Signed)
        Patient  visited [facility] on 11/26/2022  for fall, left ankle pain.   Telephone encounter attempt :  2nd  A HIPAA compliant voice message was left requesting a return call.  Instructed patient to call back at [phone].   [place] Resource Care Guide   [email or web address]  ?? [id number]   Website: [email or web address]  [email or web address]

## 2022-12-04 ENCOUNTER — Telehealth: Payer: Self-pay

## 2022-12-04 NOTE — Telephone Encounter (Signed)
        Patient  visited [facility] [place] on 11/26/2022  for fall, left ankle pain.   Telephone encounter attempt :  3rd  A HIPAA compliant voice message was left requesting a return call.  Instructed patient to call back at [phone].   [place] Resource Care Guide   [email or web address]  ?? [id number]   Website: [email or web address]  [email or web address]

## 2022-12-12 ENCOUNTER — Ambulatory Visit: Payer: Self-pay | Admitting: Licensed Clinical Social Worker

## 2022-12-12 NOTE — Patient Outreach (Signed)
  Care Coordination   Follow Up Visit Note   12/12/2022 Name: [name] MRN: [id number] DOB: 1956-08-22  [name] is a 67 y.o. year old female who sees [name], [place] for primary care. I spoke with  [name] by phone today.  What matters to the patients health and wellness today?  Symptom Management, Self-care    Goals Addressed             This Visit's Progress    Stress Management   On track    Activities and task to complete in order to accomplish goals.   Keep all upcoming appointment discussed today Continue with compliance of taking medication prescribed by Doctor Continue utilizing healthy coping skills to assist with management of stress Complete colon cancer screening and mail out sample within a week            SDOH assessments and interventions completed:  No     Care Coordination Interventions:  Yes, provided  Interventions Today    Flowsheet Row Most Recent Value  Chronic Disease   Chronic disease during today's visit Hypertension (HTN)  [Insomnia and Chronic Back Pain]  General Interventions   General Interventions Discussed/Reviewed General Interventions Reviewed, Doctor Visits  [Patient is doing well. Agreed to complete colon cancer screening and will mail sample within a week. Reviewed upcoming appts]  Doctor Visits Discussed/Reviewed Doctor Visits Reviewed  Exercise Interventions   Exercise Discussed/Reviewed Physical Activity  Mental Health Interventions   Mental Health Discussed/Reviewed Mental Health Reviewed, Coping Strategies  [Patient identified healthy coping skills and agreed to implement boundaries with work and family to assist with stress management. Self care encouraged]  Pharmacy Interventions   Pharmacy Dicussed/Reviewed Medication Adherence  [Pt endorses compliance with medication to assist with HTN management]       Follow up plan: Follow up call scheduled for 4 weeks    Encounter Outcome:  Pt.  Visit Completed   Christa See, MSW, [email or web address] Phone [phone] 4:55 PM

## 2022-12-12 NOTE — Patient Instructions (Signed)
Visit Information  Thank you for taking time to visit with me today. Please don't hesitate to contact me if I can be of assistance to you.   Following are the goals we discussed today:   Goals Addressed             This Visit's Progress    Stress Management   On track    Activities and task to complete in order to accomplish goals.   Keep all upcoming appointment discussed today Continue with compliance of taking medication prescribed by Doctor Continue utilizing healthy coping skills to assist with management of stress Complete colon cancer screening and mail out sample within a week            Our next appointment is by telephone on 05/1 at 3:30 PM  Please call the care guide team at [phone] if you need to cancel or reschedule your appointment.   If you are experiencing a Mental Health or [place] or need someone to talk to, please call the Suicide and Crisis Lifeline: 988 call 911   Patient verbalizes understanding of instructions and care plan provided today and agrees to view in [place]. Active MyChart status and patient understanding of how to access instructions and care plan via MyChart confirmed with patient.     Christa See, MSW, [email or web address] Phone [phone] 4:56 PM

## 2022-12-17 ENCOUNTER — Encounter: Payer: Self-pay | Admitting: Nurse Practitioner

## 2022-12-17 ENCOUNTER — Ambulatory Visit: Payer: Medicare PPO | Admitting: Nurse Practitioner

## 2022-12-25 ENCOUNTER — Encounter (INDEPENDENT_AMBULATORY_CARE_PROVIDER_SITE_OTHER): Payer: Medicare PPO | Admitting: Ophthalmology

## 2022-12-25 DIAGNOSIS — H34812 Central retinal vein occlusion, left eye, with macular edema: Secondary | ICD-10-CM | POA: Diagnosis not present

## 2022-12-25 DIAGNOSIS — H35033 Hypertensive retinopathy, bilateral: Secondary | ICD-10-CM

## 2022-12-25 DIAGNOSIS — I10 Essential (primary) hypertension: Secondary | ICD-10-CM | POA: Diagnosis not present

## 2022-12-25 DIAGNOSIS — H43813 Vitreous degeneration, bilateral: Secondary | ICD-10-CM

## 2022-12-25 DIAGNOSIS — E113512 Type 2 diabetes mellitus with proliferative diabetic retinopathy with macular edema, left eye: Secondary | ICD-10-CM | POA: Diagnosis not present

## 2022-12-25 DIAGNOSIS — E113311 Type 2 diabetes mellitus with moderate nonproliferative diabetic retinopathy with macular edema, right eye: Secondary | ICD-10-CM

## 2023-01-02 ENCOUNTER — Ambulatory Visit: Payer: Medicare PPO | Admitting: Nurse Practitioner

## 2023-01-03 ENCOUNTER — Other Ambulatory Visit: Payer: Self-pay | Admitting: Nurse Practitioner

## 2023-01-03 DIAGNOSIS — I10 Essential (primary) hypertension: Secondary | ICD-10-CM

## 2023-01-09 ENCOUNTER — Ambulatory Visit: Payer: Self-pay | Admitting: Licensed Clinical Social Worker

## 2023-01-09 NOTE — Patient Outreach (Addendum)
  Care Coordination   Follow Up Visit Note   01/09/2023 Name: JAYLINE KILBURG MRN: [id number] DOB: Sep 20, 1955  FRED HAMMES is a 67 y.o. year old female who sees Arnette Felts, FNP for primary care. I spoke with  Lucianne Lei by phone today.  What matters to the patients health and wellness today?  Self-Care   SDOH assessments and interventions completed:  No     Care Coordination Interventions:  Yes, provided  Interventions Today    Flowsheet Row Most Recent Value  Mental Health Interventions   Mental Health Discussed/Reviewed Mental Health Reviewed  [Self Care-Rest]       Follow up plan: Follow up call scheduled for 01/18/23    Encounter Outcome:  Pt. Request to Call Back   Jenel Lucks, MSW, LCSW [facility] Care Management [facility]  [facility] [email or web address] Phone [phone] 5:20 PM

## 2023-01-09 NOTE — Patient Instructions (Signed)
Visit Information  Thank you for taking time to visit with me today. Please don't hesitate to contact me if I can be of assistance to you.   Our next appointment is by telephone on 5/10 at 3:30 PM  Please call the care guide team at [phone] if you need to cancel or reschedule your appointment.   If you are experiencing a Mental Health or Behavioral Health Crisis or need someone to talk to, please call the Suicide and Crisis Lifeline: 988 call 911   Patient verbalizes understanding of instructions and care plan provided today and agrees to view in MyChart. Active MyChart status and patient understanding of how to access instructions and care plan via MyChart confirmed with patient.     Jenel Lucks, MSW, LCSW [facility] Care Management [place]  [facility] [email or web address] Phone [phone] 5:21 PM

## 2023-01-11 ENCOUNTER — Telehealth: Payer: Self-pay

## 2023-01-11 NOTE — Telephone Encounter (Signed)
VMT pt requesting call back to discuss starting PREP on 01/28/23  

## 2023-01-16 ENCOUNTER — Encounter: Payer: Self-pay | Admitting: Nurse Practitioner

## 2023-01-16 ENCOUNTER — Ambulatory Visit: Payer: Medicare PPO | Admitting: Nurse Practitioner

## 2023-01-16 VITALS — BP 130/62 | HR 53 | Temp 98.4°F | Ht 66.0 in | Wt 153.0 lb

## 2023-01-16 DIAGNOSIS — I10 Essential (primary) hypertension: Secondary | ICD-10-CM | POA: Diagnosis not present

## 2023-01-16 DIAGNOSIS — E119 Type 2 diabetes mellitus without complications: Secondary | ICD-10-CM

## 2023-01-16 DIAGNOSIS — Z1211 Encounter for screening for malignant neoplasm of colon: Secondary | ICD-10-CM

## 2023-01-16 DIAGNOSIS — S93402D Sprain of unspecified ligament of left ankle, subsequent encounter: Secondary | ICD-10-CM | POA: Diagnosis not present

## 2023-01-16 DIAGNOSIS — S93402A Sprain of unspecified ligament of left ankle, initial encounter: Secondary | ICD-10-CM

## 2023-01-16 DIAGNOSIS — E1151 Type 2 diabetes mellitus with diabetic peripheral angiopathy without gangrene: Secondary | ICD-10-CM | POA: Diagnosis not present

## 2023-01-16 DIAGNOSIS — Z2821 Immunization not carried out because of patient refusal: Secondary | ICD-10-CM | POA: Diagnosis not present

## 2023-01-16 DIAGNOSIS — F3341 Major depressive disorder, recurrent, in partial remission: Secondary | ICD-10-CM

## 2023-01-16 DIAGNOSIS — I700 Atherosclerosis of aorta: Secondary | ICD-10-CM

## 2023-01-16 DIAGNOSIS — R140 Abdominal distension (gaseous): Secondary | ICD-10-CM

## 2023-01-16 HISTORY — DX: Sprain of unspecified ligament of left ankle, initial encounter: S93.402A

## 2023-01-16 NOTE — Patient Instructions (Signed)
Hypertension, Adult ?Hypertension is another name for high blood pressure. High blood pressure forces your heart to work harder to pump blood. This can cause problems over time. ?There are two numbers in a blood pressure reading. There is a top number (systolic) over a bottom number (diastolic). It is best to have a blood pressure that is below 120/80. ?What are the causes? ?The cause of this condition is not known. Some other conditions can lead to high blood pressure. ?What increases the risk? ?Some lifestyle factors can make you more likely to develop high blood pressure: ?Smoking. ?Not getting enough exercise or physical activity. ?Being overweight. ?Having too much fat, sugar, calories, or salt (sodium) in your diet. ?Drinking too much alcohol. ?Other risk factors include: ?Having any of these conditions: ?Heart disease. ?Diabetes. ?High cholesterol. ?Kidney disease. ?Obstructive sleep apnea. ?Having a family history of high blood pressure and high cholesterol. ?Age. The risk increases with age. ?Stress. ?What are the signs or symptoms? ?High blood pressure may not cause symptoms. Very high blood pressure (hypertensive crisis) may cause: ?Headache. ?Fast or uneven heartbeats (palpitations). ?Shortness of breath. ?Nosebleed. ?Vomiting or feeling like you may vomit (nauseous). ?Changes in how you see. ?Very bad chest pain. ?Feeling dizzy. ?Seizures. ?How is this treated? ?This condition is treated by making healthy lifestyle changes, such as: ?Eating healthy foods. ?Exercising more. ?Drinking less alcohol. ?Your doctor may prescribe medicine if lifestyle changes do not help enough and if: ?Your top number is above 130. ?Your bottom number is above 80. ?Your personal target blood pressure may vary. ?Follow these instructions at home: ?Eating and drinking ? ?If told, follow the DASH eating plan. To follow this plan: ?Fill one half of your plate at each meal with fruits and vegetables. ?Fill one fourth of your plate  at each meal with whole grains. Whole grains include whole-wheat pasta, brown rice, and whole-grain bread. ?Eat or drink low-fat dairy products, such as skim milk or low-fat yogurt. ?Fill one fourth of your plate at each meal with low-fat (lean) proteins. Low-fat proteins include fish, chicken without skin, eggs, beans, and tofu. ?Avoid fatty meat, cured and processed meat, or chicken with skin. ?Avoid pre-made or processed food. ?Limit the amount of salt in your diet to less than 1,500 mg each day. ?Do not drink alcohol if: ?Your doctor tells you not to drink. ?You are pregnant, may be pregnant, or are planning to become pregnant. ?If you drink alcohol: ?Limit how much you have to: ?0-1 drink a day for women. ?0-2 drinks a day for men. ?Know how much alcohol is in your drink. In the [place] one drink equals one 12 oz bottle of beer (355 mL), one 5 oz glass of wine (148 mL), or one 1? oz glass of hard liquor (44 mL). ?Lifestyle ? ?Work with your doctor to stay at a healthy weight or to lose weight. Ask your doctor what the best weight is for you. ?Get at least 30 minutes of exercise that causes your heart to beat faster (aerobic exercise) most days of the week. This may include walking, swimming, or biking. ?Get at least 30 minutes of exercise that strengthens your muscles (resistance exercise) at least 3 days a week. This may include lifting weights or doing Pilates. ?Do not smoke or use any products that contain nicotine or tobacco. If you need help quitting, ask your doctor. ?Check your blood pressure at home as told by your doctor. ?Keep all follow-up visits. ?Medicines ?Take over-the-counter and prescription medicines   only as told by your doctor. Follow directions carefully. ?Do not skip doses of blood pressure medicine. The medicine does not work as well if you skip doses. Skipping doses also puts you at risk for problems. ?Ask your doctor about side effects or reactions to medicines that you should watch  for. ?Contact a doctor if: ?You think you are having a reaction to the medicine you are taking. ?You have headaches that keep coming back. ?You feel dizzy. ?You have swelling in your ankles. ?You have trouble with your vision. ?Get help right away if: ?You get a very bad headache. ?You start to feel mixed up (confused). ?You feel weak or numb. ?You feel faint. ?You have very bad pain in your: ?Chest. ?Belly (abdomen). ?You vomit more than once. ?You have trouble breathing. ?These symptoms may be an emergency. Get help right away. Call 911. ?Do not wait to see if the symptoms will go away. ?Do not drive yourself to the hospital. ?Summary ?Hypertension is another name for high blood pressure. ?High blood pressure forces your heart to work harder to pump blood. ?For most people, a normal blood pressure is less than 120/80. ?Making healthy choices can help lower blood pressure. If your blood pressure does not get lower with healthy choices, you may need to take medicine. ?This information is not intended to replace advice given to you by your health care provider. Make sure you discuss any questions you have with your health care provider. ?Document Revised: 06/15/2021 Document Reviewed: 06/15/2021 ?Elsevier Patient Education ? 2023 [employer] ? ?

## 2023-01-16 NOTE — Progress Notes (Addendum)
Hershal Coria Martin,acting as a Neurosurgeon for Arnette Felts, FNP.,have documented all relevant documentation on the behalf of Arnette Felts, FNP,as directed by  Arnette Felts, FNP while in the presence of Arnette Felts, FNP.    Subjective:     Patient ID: [name] , female    DOB: February 25, 1956 , 67 y.o.   MRN: [id number]   Chief Complaint  Patient presents with   Hypertension    HPI  Patient presents today for a BP check, patient states compliance with medications and has no other concerns at this time. 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 Does not need refills on medications  BP Readings from Last 3 Encounters: 01/16/23 : 130/62 11/26/22 : (!) 164/78 08/16/22 : 124/64  Wt Readings from Last 3 Encounters: 01/16/23 : 153 lb (69.4 kg) 09/12/22 : 128 lb (58.1 kg) 08/16/22 : 128 lb (58.1 kg)    Hypertension This is a chronic problem. The current episode started more than 1 year ago. The problem is uncontrolled. Pertinent negatives include no blurred vision, chest pain, headaches or palpitations. There are no associated agents to hypertension. Risk factors for coronary artery disease include sedentary lifestyle, obesity and diabetes mellitus. There are no compliance problems.  There is no history of chronic renal disease.  Diabetes She presents for her follow-up diabetic visit. She has type 2 diabetes mellitus. Her disease course has been worsening. There are no hypoglycemic associated symptoms. Pertinent negatives for hypoglycemia include no dizziness or headaches. Pertinent negatives for diabetes include no blurred vision, no chest pain, no fatigue, no polydipsia, no polyphagia and no polyuria. There are no hypoglycemic complications. Symptoms are improving. Diabetic complications include heart disease. Risk factors for coronary artery disease include sedentary lifestyle and obesity. Current diabetic treatment includes oral agent (dual therapy) (15 units Xultophy  daily). She is compliant with treatment most of the time. Her weight is stable. She is following a generally healthy diet. When asked about meal planning, she reported none. She has not had a previous visit with a dietitian. She rarely participates in exercise. (Blood sugar averaging 127 does not check as regularly as she should. ) An ACE inhibitor/angiotensin II receptor blocker is not being taken. Eye exam is not current (she is having eye treatment with Dr. Ashley Royalty for cataract, Dr.Groat is her opthalmologist).     Past Medical History:  Diagnosis Date   Anemia 01/07/2020   REQUIRING TRANSFUSION   Back pain    Diabetes mellitus without complication (HCC)    GERD (gastroesophageal reflux disease)    Hypertension    Olecranon bursitis of left elbow      Family History  Problem Relation Age of Onset   Diabetes Mother    Hypertension Mother    Diabetes Father    Hypertension Father    Breast cancer Sister    Breast cancer Sister    Breast cancer Paternal Aunt      Current Outpatient Medications:    amLODipine (NORVASC) 5 MG tablet, TAKE 1 TABLET (5 MG TOTAL) BY MOUTH DAILY., Disp: 90 tablet, Rfl: 1   aspirin EC 81 MG tablet, Take 81 mg by mouth daily. Swallow whole., Disp: , Rfl:    atorvastatin (LIPITOR) 10 MG tablet, TAKE 1 TABLET (10 MG TOTAL) BY MOUTH DAILY., Disp: 30 tablet, Rfl: 11   dorzolamide-timolol (COSOPT) 22.3-6.8 MG/ML ophthalmic solution, Place 1 drop into both eyes 2 (two) times daily., Disp: , Rfl:    escitalopram (LEXAPRO) 20 MG  tablet, TAKE 1 TABLET (20 MG TOTAL) BY MOUTH DAILY., Disp: 30 tablet, Rfl: 3   Multiple Vitamins-Minerals (CENTRUM ADULT PO), Take by mouth., Disp: , Rfl:    valsartan (DIOVAN) 160 MG tablet, TAKE 1 TABLET (160 MG TOTAL) BY MOUTH DAILY., Disp: 90 tablet, Rfl: 1   Vitamin D, Ergocalciferol, (DRISDOL) 1.25 MG (50000 UNIT) CAPS capsule, TAKE 1 CAPSULE (50,000 UNITS TOTAL) BY MOUTH EVERY 7 (SEVEN) DAYS., Disp: 12 capsule, Rfl: 0   ferrous  sulfate 325 (65 FE) MG tablet, Take 1 tablet (325 mg total) by mouth 2 (two) times daily with a meal., Disp: 60 tablet, Rfl: 1   glucose blood (ONETOUCH ULTRA) test strip, USE TO CHECK BLOOD SUGAR ONCE A DAY AS INSTRUCTED (Patient not taking: Reported on 09/12/2022), Disp: 100 each, Rfl: 2   Lancets (ONETOUCH DELICA PLUS LANCET33G) MISC, USE TO CHECK BLOOD SUGAR ONCE A DAY AS INSTRUCTED (Patient not taking: Reported on 09/12/2022), Disp: 100 each, Rfl: 2   No Known Allergies   Review of Systems  Constitutional:  Negative for fatigue.  Eyes:  Negative for blurred vision.  Cardiovascular:  Negative for chest pain and palpitations.  Endocrine: Negative for polydipsia, polyphagia and polyuria.  Neurological:  Negative for dizziness and headaches.     Today's Vitals   01/16/23 1559  BP: 130/62  Pulse: (!) 53  Temp: 98.4 F (36.9 C)  TempSrc: Oral  Weight: 153 lb (69.4 kg)  Height: 5\' 6"  (1.676 m)  PainSc: 7   PainLoc: Hip   Body mass index is 24.69 kg/m.  Wt Readings from Last 3 Encounters:  01/16/23 153 lb (69.4 kg)  09/12/22 128 lb (58.1 kg)  08/16/22 128 lb (58.1 kg)    Objective:  Physical Exam Vitals reviewed.  Constitutional:      General: She is not in acute distress.    Appearance: Normal appearance.  Cardiovascular:     Rate and Rhythm: Normal rate and regular rhythm.     Pulses: Normal pulses.     Heart sounds: Normal heart sounds. No murmur heard. Pulmonary:     Effort: Pulmonary effort is normal. No respiratory distress.     Breath sounds: Normal breath sounds. No wheezing.  Musculoskeletal:        General: No swelling, tenderness (left ankle), deformity or signs of injury. Normal range of motion.  Skin:    General: Skin is warm and dry.     Capillary Refill: Capillary refill takes less than 2 seconds.     Coloration: Skin is not jaundiced.     Findings: No bruising.  Neurological:     General: No focal deficit present.     Mental Status: She is alert and  oriented to person, place, and time.     Cranial Nerves: No cranial nerve deficit.     Motor: No weakness.  Psychiatric:        Mood and Affect: Mood normal.        Behavior: Behavior normal.        Thought Content: Thought content normal.        Judgment: Judgment normal.         Assessment And Plan:     1. Essential hypertension Comments: Blood pressure is fairly controlled.  Continue current medications and limiting intake of salt. - CMP14+EGFR  2. 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  Continue current medications.  Continue statin tolerating well. - Hemoglobin A1c  3. Recurrent  major depressive disorder, in partial remission (HCC) Comments: Depression screen is a 3..  This is a improved since her last visit.  4. Sprain of left ankle, unspecified ligament, subsequent encounter Comments: Negative for any swelling.  Advised to elevate if has swelling.  She is able to walk on her foot so least likely to be a fracture.  5. Flatulence/gas pain/belching Comments: Advised to take a probiotic daily and limit intake of foods that are high gas producing  6. Encounter for screening colonoscopy - Ambulatory referral to Gastroenterology  7. Herpes zoster vaccination declined Declines shingrix, educated on disease process and is aware if he changes his mind to notify office    Patient was given opportunity to ask questions. Patient verbalized understanding of the plan and was able to repeat key elements of the plan. All questions were answered to their satisfaction.  Arnette Felts, FNP   I, Arnette Felts, FNP, have reviewed all documentation for this visit. The documentation on 01/16/23 for the exam, diagnosis, procedures, and orders are all accurate and complete.   IF YOU HAVE BEEN REFERRED TO A SPECIALIST, IT MAY TAKE 1-2 WEEKS TO SCHEDULE/PROCESS THE REFERRAL. IF YOU HAVE NOT HEARD FROM US/SPECIALIST IN TWO WEEKS, PLEASE  GIVE [place] A CALL AT [phone].   THE PATIENT IS ENCOURAGED TO PRACTICE SOCIAL DISTANCING DUE TO THE COVID-19 PANDEMIC.

## 2023-01-17 LAB — CMP14+EGFR
ALT: 9 IU/L (ref 0–32)
AST: 14 IU/L (ref 0–40)
Albumin/Globulin Ratio: 1.5 (ref 1.2–2.2)
Albumin: 4.1 g/dL (ref 3.9–4.9)
Alkaline Phosphatase: 103 IU/L (ref 44–121)
BUN/Creatinine Ratio: 22 (ref 12–28)
BUN: 18 mg/dL (ref 8–27)
Bilirubin Total: <0.2 mg/dL (ref 0.0–1.2)
CO2: 24 mmol/L (ref 20–29)
Calcium: 9.2 mg/dL (ref 8.7–10.3)
Chloride: 104 mmol/L (ref 96–106)
Creatinine, Ser: 0.83 mg/dL (ref 0.57–1.00)
Globulin, Total: 2.7 g/dL (ref 1.5–4.5)
Glucose: 80 mg/dL (ref 70–99)
Potassium: 4.9 mmol/L (ref 3.5–5.2)
Sodium: 138 mmol/L (ref 134–144)
Total Protein: 6.8 g/dL (ref 6.0–8.5)
eGFR: 78 mL/min/{1.73_m2} (ref >59)

## 2023-01-17 LAB — HEMOGLOBIN A1C
Est. average glucose Bld gHb Est-mCnc: 134 mg/dL
Hgb A1c MFr Bld: 6.3 % — ABNORMAL HIGH (ref 4.8–5.6)

## 2023-01-18 ENCOUNTER — Ambulatory Visit: Payer: Self-pay | Admitting: Licensed Clinical Social Worker

## 2023-01-18 NOTE — Patient Instructions (Signed)
Visit Information  Thank you for taking time to visit with me today. Please don't hesitate to contact me if I can be of assistance to you.    Please call the care guide team at [phone] if you need to cancel or reschedule your appointment.   If you are experiencing a Mental Health or Behavioral Health Crisis or need someone to talk to, please call the Suicide and Crisis Lifeline: 988 call 911   Patient verbalizes understanding of instructions and care plan provided today and agrees to view in MyChart. Active MyChart status and patient understanding of how to access instructions and care plan via MyChart confirmed with patient.     Jenel Lucks, MSW, LCSW [facility] Care Management [place]  [facility] [email or web address] Phone [phone] 6:29 PM

## 2023-01-18 NOTE — Patient Outreach (Signed)
  Care Coordination   Follow Up Visit Note   01/18/2023 Name: [name] MRN: [id number] DOB: October 21, 1955  [name] is a 67 y.o. year old female who sees Arnette Felts, FNP for primary care. I spoke with  Lucianne Lei by phone today.  What matters to the patients health and wellness today?  Patient requested LCSW call back.   SDOH assessments and interventions completed:  No     Care Coordination Interventions:  Yes, provided   Follow up plan: Follow up call scheduled for 1 week    Encounter Outcome:  Pt. Request to Call Back   Jenel Lucks, MSW, LCSW [facility] Care Management [facility]  [facility] [email or web address] Phone [phone] 6:29 PM

## 2023-01-22 ENCOUNTER — Encounter (INDEPENDENT_AMBULATORY_CARE_PROVIDER_SITE_OTHER): Payer: Medicare PPO | Admitting: Ophthalmology

## 2023-01-22 DIAGNOSIS — Z7984 Long term (current) use of oral hypoglycemic drugs: Secondary | ICD-10-CM | POA: Diagnosis not present

## 2023-01-22 DIAGNOSIS — H43813 Vitreous degeneration, bilateral: Secondary | ICD-10-CM

## 2023-01-22 DIAGNOSIS — H35033 Hypertensive retinopathy, bilateral: Secondary | ICD-10-CM

## 2023-01-22 DIAGNOSIS — I10 Essential (primary) hypertension: Secondary | ICD-10-CM

## 2023-01-22 DIAGNOSIS — E113311 Type 2 diabetes mellitus with moderate nonproliferative diabetic retinopathy with macular edema, right eye: Secondary | ICD-10-CM | POA: Diagnosis not present

## 2023-01-22 DIAGNOSIS — E113512 Type 2 diabetes mellitus with proliferative diabetic retinopathy with macular edema, left eye: Secondary | ICD-10-CM

## 2023-01-22 DIAGNOSIS — H34812 Central retinal vein occlusion, left eye, with macular edema: Secondary | ICD-10-CM | POA: Diagnosis not present

## 2023-01-25 ENCOUNTER — Encounter: Payer: Self-pay | Admitting: Nurse Practitioner

## 2023-01-25 ENCOUNTER — Encounter: Payer: Self-pay | Admitting: *Deleted

## 2023-01-25 NOTE — Progress Notes (Signed)
[facility] Quality Team Note  Name: [name] Date of Birth: [birth year] MRN: [id number] Date: 01/25/2023  [facility] Quality Team has reviewed this patient's chart, please see recommendations:  Called pt to offer eye event.  Pt declined.  Says she sees Dr. Ashley Royalty and will have report sent to the office.  Pt wants to know if someone there can help her get more handicap stickers.  Pt does have an open gap for KED measure.  Would need urine albumin creatinine to close gap.

## 2023-01-28 ENCOUNTER — Encounter: Payer: Self-pay | Admitting: Physician Assistant

## 2023-01-28 DIAGNOSIS — R140 Abdominal distension (gaseous): Secondary | ICD-10-CM | POA: Insufficient documentation

## 2023-02-05 ENCOUNTER — Telehealth: Payer: Self-pay

## 2023-02-05 NOTE — Telephone Encounter (Signed)
I called and left pt vm letting her know her form has been completed and placed up front for pick up. YL,RMA

## 2023-02-06 ENCOUNTER — Inpatient Hospital Stay: Admission: RE | Admit: 2023-02-06 | Payer: Medicare PPO | Source: Ambulatory Visit

## 2023-02-07 ENCOUNTER — Ambulatory Visit
Admission: RE | Admit: 2023-02-07 | Discharge: 2023-02-07 | Disposition: A | Discharge status: Home / Self Care | Payer: Medicare PPO | Source: Ambulatory Visit | Attending: Nurse Practitioner | Admitting: Nurse Practitioner

## 2023-02-07 DIAGNOSIS — E2839 Other primary ovarian failure: Secondary | ICD-10-CM

## 2023-02-07 DIAGNOSIS — I119 Hypertensive heart disease without heart failure: Secondary | ICD-10-CM

## 2023-02-07 DIAGNOSIS — M810 Age-related osteoporosis without current pathological fracture: Secondary | ICD-10-CM | POA: Diagnosis not present

## 2023-02-11 ENCOUNTER — Other Ambulatory Visit: Payer: Self-pay | Admitting: Nurse Practitioner

## 2023-02-11 DIAGNOSIS — E559 Vitamin D deficiency, unspecified: Secondary | ICD-10-CM

## 2023-02-14 ENCOUNTER — Other Ambulatory Visit: Payer: Self-pay | Admitting: Nurse Practitioner

## 2023-02-14 DIAGNOSIS — R7303 Prediabetes: Secondary | ICD-10-CM

## 2023-02-14 MED ORDER — METFORMIN HCL 500 MG PO TABS: 500.0000 mg | ORAL_TABLET | Freq: Every day | ORAL | 1 refills | Status: DC

## 2023-02-15 ENCOUNTER — Telehealth: Payer: Self-pay

## 2023-02-15 NOTE — Telephone Encounter (Signed)
VMT pt requesting call back to discuss starting PREP on 02/25/23

## 2023-02-19 ENCOUNTER — Encounter (INDEPENDENT_AMBULATORY_CARE_PROVIDER_SITE_OTHER): Payer: Medicare PPO | Admitting: Ophthalmology

## 2023-02-19 DIAGNOSIS — H34812 Central retinal vein occlusion, left eye, with macular edema: Secondary | ICD-10-CM | POA: Diagnosis not present

## 2023-02-19 DIAGNOSIS — E113311 Type 2 diabetes mellitus with moderate nonproliferative diabetic retinopathy with macular edema, right eye: Secondary | ICD-10-CM | POA: Diagnosis not present

## 2023-02-19 DIAGNOSIS — H35023 Exudative retinopathy, bilateral: Secondary | ICD-10-CM

## 2023-02-19 DIAGNOSIS — I10 Essential (primary) hypertension: Secondary | ICD-10-CM | POA: Diagnosis not present

## 2023-02-19 DIAGNOSIS — H43813 Vitreous degeneration, bilateral: Secondary | ICD-10-CM

## 2023-02-19 DIAGNOSIS — E113512 Type 2 diabetes mellitus with proliferative diabetic retinopathy with macular edema, left eye: Secondary | ICD-10-CM | POA: Diagnosis not present

## 2023-02-20 ENCOUNTER — Other Ambulatory Visit: Payer: Self-pay | Admitting: Nurse Practitioner

## 2023-02-20 DIAGNOSIS — E559 Vitamin D deficiency, unspecified: Secondary | ICD-10-CM

## 2023-03-07 ENCOUNTER — Other Ambulatory Visit: Payer: Self-pay

## 2023-03-07 DIAGNOSIS — E559 Vitamin D deficiency, unspecified: Secondary | ICD-10-CM

## 2023-03-07 MED ORDER — VITAMIN D (ERGOCALCIFEROL) 1.25 MG (50000 UNIT) PO CAPS: 50000.0000 [IU] | ORAL_CAPSULE | ORAL | 0 refills | Status: DC

## 2023-03-12 ENCOUNTER — Other Ambulatory Visit: Payer: Self-pay | Admitting: Nurse Practitioner

## 2023-03-12 DIAGNOSIS — M810 Age-related osteoporosis without current pathological fracture: Secondary | ICD-10-CM

## 2023-03-12 MED ORDER — ALENDRONATE SODIUM 70 MG PO TABS: 70.0000 mg | ORAL_TABLET | ORAL | 3 refills | Status: AC

## 2023-03-19 ENCOUNTER — Encounter (INDEPENDENT_AMBULATORY_CARE_PROVIDER_SITE_OTHER): Payer: Medicare PPO | Admitting: Ophthalmology

## 2023-03-19 DIAGNOSIS — H34812 Central retinal vein occlusion, left eye, with macular edema: Secondary | ICD-10-CM

## 2023-03-19 DIAGNOSIS — H2513 Age-related nuclear cataract, bilateral: Secondary | ICD-10-CM | POA: Diagnosis not present

## 2023-03-19 DIAGNOSIS — E113512 Type 2 diabetes mellitus with proliferative diabetic retinopathy with macular edema, left eye: Secondary | ICD-10-CM

## 2023-03-19 DIAGNOSIS — H35033 Hypertensive retinopathy, bilateral: Secondary | ICD-10-CM

## 2023-03-19 DIAGNOSIS — H43813 Vitreous degeneration, bilateral: Secondary | ICD-10-CM

## 2023-03-19 DIAGNOSIS — I10 Essential (primary) hypertension: Secondary | ICD-10-CM

## 2023-03-19 DIAGNOSIS — E113311 Type 2 diabetes mellitus with moderate nonproliferative diabetic retinopathy with macular edema, right eye: Secondary | ICD-10-CM | POA: Diagnosis not present

## 2023-04-10 ENCOUNTER — Ambulatory Visit: Payer: Medicare PPO | Admitting: Physician Assistant

## 2023-04-23 ENCOUNTER — Other Ambulatory Visit: Payer: Self-pay | Admitting: Nurse Practitioner

## 2023-04-23 ENCOUNTER — Ambulatory Visit: Payer: Self-pay

## 2023-04-23 NOTE — Patient Outreach (Signed)
Care Coordination   Follow Up Visit Note   04/23/2023 Name: [name] MRN: [id number] DOB: 22-Feb-1956  [name] is a 67 y.o. year old female who sees Arnette Felts, FNP for primary care. I spoke with  Lucianne Lei by phone today.  What matters to the patients health and wellness today?  Patient will start monitoring her sugars at home. She would like to speak with a SW for counseling and resources.     Goals Addressed               This Visit's Progress     Patient Stated     To have weight loss and anemia evaluated (pt-stated)   On track     Care Coordination Interventions: Evaluation of current treatment plan related to abnormal weight loss with low blood count and patient's adherence to plan as established by provider Reviewed and discussed with patient her new patient appointment with GI is scheduled with Unk Lightning, PA on 06/20/23 @2 :00 PM       Other     To improve self management of diabetes        Care Coordination Interventions: Provided education to patient about basic DM disease process Review of patient status, including review of consultants reports, relevant laboratory and other test results, and medications completed Reviewed medications with patient and discussed importance of medication adherence Advised patient, providing education and rationale, to check cbg daily before meals and record, calling PCP for findings outside established parameters Determined patient does not have a glucometer, it was lost while she was homeless, patient is agreeable to self checking her sugars once she receive a new glucometer Sent secure message to Arnette Felts FNP requesting Rx for replacement glucometer, reply received an Rx will be sent Counseled on Diabetic diet, my plate method, 865 minutes of moderate intensity exercise/week Assessed for SDOH barriers, sent SW referral to Jenel Lucks LCSW requesting outreach to patient to review Medicaid  benefits, provide resources for transportation, help with utilities, counseling and support for worsening depression  Mailed printed educational materials to patient related to diabetes management, Chair Exercises Lab Results  Component Value Date   HGBA1C 6.3 (H) 01/16/2023       COMPLETED: To start the PREP program        Care Coordination Interventions: Evaluation of current treatment plan related to Impaired Physical Mobility  and patient's adherence to plan as established by provider Determined patient is unable to participate in the PREP program due to having difficulty with transportation      Interventions Today    Flowsheet Row Most Recent Value  Chronic Disease   Chronic disease during today's visit Diabetes, Other  [Osteoporosis]  General Interventions   General Interventions Discussed/Reviewed General Interventions Discussed, General Interventions Reviewed, Doctor Visits, Labs, Communication with, Durable Medical Equipment (DME)  Doctor Visits Discussed/Reviewed Doctor Visits Reviewed, Doctor Visits Discussed, PCP  Durable Medical Equipment (DME) Dan Humphreys  [rollator]  Communication with Social Work  Jenel Lucks LCSW]  Exercise Interventions   Exercise Discussed/Reviewed Exercise Discussed, Exercise Reviewed, Physical Activity  Physical Activity Discussed/Reviewed Physical Activity Discussed, Physical Activity Reviewed, Types of exercise  Education Interventions   Education Provided Provided Education  Provided Verbal Education On When to see the doctor, Labs, Medication, Exercise  Labs Reviewed Hgb A1c  Mental Health Interventions   Mental Health Discussed/Reviewed Mental Health Discussed, Mental Health Reviewed, Anxiety  Nutrition Interventions   Nutrition Discussed/Reviewed Nutrition Discussed, Nutrition Reviewed, Carbohydrate meal planning,  Portion sizes, Fluid intake  Pharmacy Interventions   Pharmacy Dicussed/Reviewed Pharmacy Topics Discussed, Pharmacy Topics  Reviewed, Medications and their functions  Safety Interventions   Safety Discussed/Reviewed Safety Discussed, Safety Reviewed, Fall Risk, Home Safety  Home Safety Assistive Devices          SDOH assessments and interventions completed:  Yes  SDOH Interventions Today    Flowsheet Row Most Recent Value  SDOH Interventions   Food Insecurity Interventions AMB Referral  Jenel Lucks LCSW]  Transportation Interventions AMB Referral  [Jasmine Lewis LCSW]  Utilities Interventions AMB Referral  Jenel Lucks LCSW]        Care Coordination Interventions:  Yes, provided   Follow up plan: Referral made to Jenel Lucks LCSW for counseling and support, resources to help w/utilities, food restraint, transportation  Follow up call scheduled for 05/23/23 @09 :00 AM    Encounter Outcome:  Pt. Visit Completed

## 2023-04-23 NOTE — Patient Instructions (Signed)
Visit Information  Thank you for taking time to visit with me today. Please don't hesitate to contact me if I can be of assistance to you.   Following are the goals we discussed today:   Goals Addressed               This Visit's Progress     Patient Stated     To have weight loss and anemia evaluated (pt-stated)   On track     Care Coordination Interventions: Evaluation of current treatment plan related to abnormal weight loss with low blood count and patient's adherence to plan as established by provider Reviewed and discussed with patient her new patient appointment with GI is scheduled with Unk Lightning, PA on 06/20/23 @2 :00 PM       Other     To improve self management of diabetes        Care Coordination Interventions: Provided education to patient about basic DM disease process Review of patient status, including review of consultants reports, relevant laboratory and other test results, and medications completed Reviewed medications with patient and discussed importance of medication adherence Advised patient, providing education and rationale, to check cbg daily before meals and record, calling PCP for findings outside established parameters Determined patient does not have a glucometer, it was lost while she was homeless, patient is agreeable to self checking her sugars once she receive a new glucometer Sent secure message to Arnette Felts FNP requesting Rx for replacement glucometer, reply received an Rx will be sent Counseled on Diabetic diet, my plate method, 161 minutes of moderate intensity exercise/week Assessed for SDOH barriers, sent SW referral to Jenel Lucks LCSW requesting outreach to patient to review Medicaid benefits, provide resources for transportation, help with utilities, counseling and support for worsening depression  Mailed printed educational materials to patient related to diabetes management, Chair Exercises Lab Results  Component Value  Date   HGBA1C 6.3 (H) 01/16/2023        COMPLETED: To start the PREP program        Care Coordination Interventions: Evaluation of current treatment plan related to Impaired Physical Mobility  and patient's adherence to plan as established by provider Determined patient is unable to participate in the PREP program due to having difficulty with transportation         Our next appointment is by telephone on 05/23/23 at 09:00 AM  Please call the care guide team at [phone] if you need to cancel or reschedule your appointment.   If you are experiencing a Mental Health or Behavioral Health Crisis or need someone to talk to, please call 1-800-273-TALK (toll free, 24 hour hotline)  Patient verbalizes understanding of instructions and care plan provided today and agrees to view in MyChart. Active MyChart status and patient understanding of how to access instructions and care plan via MyChart confirmed with patient.     Delsa Sale, RN, BSN, CCM Care Management Coordinator [facility] Care Management  Direct Phone: [phone]

## 2023-04-24 ENCOUNTER — Encounter (INDEPENDENT_AMBULATORY_CARE_PROVIDER_SITE_OTHER): Payer: Medicare PPO | Admitting: Ophthalmology

## 2023-05-07 ENCOUNTER — Other Ambulatory Visit: Payer: Self-pay | Admitting: Nurse Practitioner

## 2023-05-07 DIAGNOSIS — F3289 Other specified depressive episodes: Secondary | ICD-10-CM

## 2023-05-20 NOTE — Progress Notes (Unsigned)
Madelaine Bhat, CMA,acting as a Neurosurgeon for Arnette Felts, FNP.,have documented all relevant documentation on the behalf of Arnette Felts, FNP,as directed by  Arnette Felts, FNP while in the presence of Arnette Felts, FNP.  Subjective:  Patient ID: [name] , female    DOB: 1956-07-21 , 67 y.o.   MRN: [id number]  No chief complaint on file.   HPI  Patient presents today for a BP and DM follow up, patient reports compliance with medication. Patient denies any chest pain, SOB, or headaches. Patient has no concerns today.     Past Medical History:  Diagnosis Date  . Anemia 01/07/2020   REQUIRING TRANSFUSION  . Back pain   . Diabetes mellitus without complication (HCC)   . GERD (gastroesophageal reflux disease)   . Hypertension   . Olecranon bursitis of left elbow      Family History  Problem Relation Age of Onset  . Diabetes Mother   . Hypertension Mother   . Diabetes Father   . Hypertension Father   . Breast cancer Sister   . Breast cancer Sister   . Breast cancer Paternal Aunt      Current Outpatient Medications:  .  alendronate (FOSAMAX) 70 MG tablet, Take 1 tablet (70 mg total) by mouth every 7 (seven) days. Take with a full glass of water on an empty stomach., Disp: 4 tablet, Rfl: 3 .  amLODipine (NORVASC) 5 MG tablet, TAKE 1 TABLET (5 MG TOTAL) BY MOUTH DAILY., Disp: 90 tablet, Rfl: 1 .  aspirin EC 81 MG tablet, Take 81 mg by mouth daily. Swallow whole., Disp: , Rfl:  .  atorvastatin (LIPITOR) 10 MG tablet, TAKE 1 TABLET (10 MG TOTAL) BY MOUTH DAILY., Disp: 30 tablet, Rfl: 11 .  dorzolamide-timolol (COSOPT) 22.3-6.8 MG/ML ophthalmic solution, Place 1 drop into both eyes 2 (two) times daily., Disp: , Rfl:  .  escitalopram (LEXAPRO) 20 MG tablet, TAKE 1 TABLET (20 MG TOTAL) BY MOUTH DAILY., Disp: 30 tablet, Rfl: 3 .  ferrous sulfate 325 (65 FE) MG tablet, Take 1 tablet (325 mg total) by mouth 2 (two) times daily with a meal., Disp: 60 tablet, Rfl: 1 .  glucose blood  (ONETOUCH ULTRA) test strip, USE TO CHECK BLOOD SUGAR ONCE A DAY AS INSTRUCTED (Patient not taking: Reported on 09/12/2022), Disp: 100 each, Rfl: 2 .  Lancets (ONETOUCH DELICA PLUS LANCET33G) MISC, USE TO CHECK BLOOD SUGAR ONCE A DAY AS INSTRUCTED (Patient not taking: Reported on 09/12/2022), Disp: 100 each, Rfl: 2 .  metFORMIN (GLUCOPHAGE) 500 MG tablet, Take 1 tablet (500 mg total) by mouth daily with breakfast., Disp: 90 tablet, Rfl: 1 .  Multiple Vitamins-Minerals (CENTRUM ADULT PO), Take by mouth., Disp: , Rfl:  .  valsartan (DIOVAN) 160 MG tablet, TAKE 1 TABLET (160 MG TOTAL) BY MOUTH DAILY., Disp: 90 tablet, Rfl: 1 .  Vitamin D, Ergocalciferol, (DRISDOL) 1.25 MG (50000 UNIT) CAPS capsule, Take 1 capsule (50,000 Units total) by mouth every 7 (seven) days., Disp: 12 capsule, Rfl: 0   No Known Allergies   Review of Systems   There were no vitals filed for this visit. There is no height or weight on file to calculate BMI.  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  Patient was given opportunity to ask questions. Patient verbalized understanding of the plan and was able to repeat key elements of the plan. All questions were answered to their satisfaction.    Jeanell Sparrow, FNP, have reviewed all documentation for this visit. The documentation on 05/20/23 for the exam, diagnosis, procedures, and orders are all accurate and complete.   IF YOU HAVE BEEN REFERRED TO A SPECIALIST, IT MAY TAKE 1-2 WEEKS TO SCHEDULE/PROCESS THE REFERRAL. IF YOU HAVE NOT HEARD FROM US/SPECIALIST IN TWO WEEKS, PLEASE  GIVE [place] A CALL AT [phone].

## 2023-05-21 ENCOUNTER — Ambulatory Visit (INDEPENDENT_AMBULATORY_CARE_PROVIDER_SITE_OTHER): Payer: Medicare PPO | Admitting: Nurse Practitioner

## 2023-05-21 ENCOUNTER — Encounter: Payer: Self-pay | Admitting: Nurse Practitioner

## 2023-05-21 VITALS — BP 120/78 | HR 68 | Temp 99.5°F | Ht 66.0 in | Wt 145.0 lb

## 2023-05-21 DIAGNOSIS — R509 Fever, unspecified: Secondary | ICD-10-CM

## 2023-05-21 DIAGNOSIS — E559 Vitamin D deficiency, unspecified: Secondary | ICD-10-CM | POA: Diagnosis not present

## 2023-05-21 DIAGNOSIS — Z2821 Immunization not carried out because of patient refusal: Secondary | ICD-10-CM | POA: Diagnosis not present

## 2023-05-21 DIAGNOSIS — I700 Atherosclerosis of aorta: Secondary | ICD-10-CM

## 2023-05-21 DIAGNOSIS — I119 Hypertensive heart disease without heart failure: Secondary | ICD-10-CM

## 2023-05-21 DIAGNOSIS — F3341 Major depressive disorder, recurrent, in partial remission: Secondary | ICD-10-CM | POA: Diagnosis not present

## 2023-05-21 DIAGNOSIS — E11319 Type 2 diabetes mellitus with unspecified diabetic retinopathy without macular edema: Secondary | ICD-10-CM | POA: Diagnosis not present

## 2023-05-21 DIAGNOSIS — Z720 Tobacco use: Secondary | ICD-10-CM

## 2023-05-21 DIAGNOSIS — E1151 Type 2 diabetes mellitus with diabetic peripheral angiopathy without gangrene: Secondary | ICD-10-CM

## 2023-05-21 DIAGNOSIS — E1159 Type 2 diabetes mellitus with other circulatory complications: Secondary | ICD-10-CM | POA: Diagnosis not present

## 2023-05-21 DIAGNOSIS — Z794 Long term (current) use of insulin: Secondary | ICD-10-CM

## 2023-05-21 DIAGNOSIS — I10 Essential (primary) hypertension: Secondary | ICD-10-CM

## 2023-05-21 LAB — POCT URINALYSIS DIPSTICK
Blood, UA: NEGATIVE
Glucose, UA: NEGATIVE
Leukocytes, UA: NEGATIVE
Nitrite, UA: NEGATIVE
Protein, UA: POSITIVE — AB
Spec Grav, UA: 1.025 (ref 1.010–1.025)
Urobilinogen, UA: 1 U/dL
pH, UA: 5.5 (ref 5.0–8.0)

## 2023-05-21 LAB — POC SOFIA 2 FLU + SARS ANTIGEN FIA
Influenza A, POC: NEGATIVE
Influenza B, POC: NEGATIVE
SARS Coronavirus 2 Ag: NEGATIVE

## 2023-05-21 MED ORDER — BUPROPION HCL ER (XL) 150 MG PO TB24: 150.0000 mg | ORAL_TABLET | ORAL | 2 refills | Status: DC

## 2023-05-21 NOTE — Assessment & Plan Note (Signed)
HgbA1c has been stable, continue current medications

## 2023-05-21 NOTE — Assessment & Plan Note (Signed)

## 2023-05-21 NOTE — Assessment & Plan Note (Signed)

## 2023-05-21 NOTE — Assessment & Plan Note (Signed)
Will check vitamin D level and supplement as needed.    Also encouraged to spend 15 minutes in the sun daily.   

## 2023-05-21 NOTE — Assessment & Plan Note (Addendum)
Lung sounds are clear and equal bilaterally. Urinalysis is negative for nitrates however dark in color will send for urine culture. Pending labs/urine culture will consider getting a chest xray. Negative rapid covid.

## 2023-05-21 NOTE — Assessment & Plan Note (Signed)
Will start on wellbutrin, nicotine patches were ineffective.

## 2023-05-21 NOTE — Assessment & Plan Note (Signed)
Blood pressure is well controlled, continue current medications.

## 2023-05-21 NOTE — Patient Instructions (Signed)
You will take your lexapro every other day for one week then take every 2 days for one week then start wellbutrin on the 3rd week hopefully this will help you to quit smoking.

## 2023-05-21 NOTE — Assessment & Plan Note (Signed)
Depression screen score is 13, will refer to psychiatry and psychology (counseling). I am also changing her from lexapro to wellbtrin as this may help her with her smoking. Take lexapro every other day for one week then every 2 days for one week then start wellbutrin

## 2023-05-21 NOTE — Assessment & Plan Note (Signed)
Declines shingrix, educated on disease process and is aware if he changes his mind to notify office  

## 2023-05-22 ENCOUNTER — Encounter (INDEPENDENT_AMBULATORY_CARE_PROVIDER_SITE_OTHER): Payer: Medicare PPO | Admitting: Ophthalmology

## 2023-05-22 LAB — BASIC METABOLIC PANEL
BUN/Creatinine Ratio: 18 (ref 12–28)
BUN: 20 mg/dL (ref 8–27)
CO2: 24 mmol/L (ref 20–29)
Calcium: 9.4 mg/dL (ref 8.7–10.3)
Chloride: 102 mmol/L (ref 96–106)
Creatinine, Ser: 1.11 mg/dL — ABNORMAL HIGH (ref 0.57–1.00)
Glucose: 80 mg/dL (ref 70–99)
Potassium: 4.2 mmol/L (ref 3.5–5.2)
Sodium: 140 mmol/L (ref 134–144)
eGFR: 54 mL/min/{1.73_m2} — ABNORMAL LOW (ref >59)

## 2023-05-22 LAB — HEMOGLOBIN A1C
Est. average glucose Bld gHb Est-mCnc: 128 mg/dL
Hgb A1c MFr Bld: 6.1 % — ABNORMAL HIGH (ref 4.8–5.6)

## 2023-05-22 LAB — URINE CULTURE

## 2023-05-23 ENCOUNTER — Encounter (INDEPENDENT_AMBULATORY_CARE_PROVIDER_SITE_OTHER): Payer: Medicare PPO | Admitting: Ophthalmology

## 2023-05-23 ENCOUNTER — Ambulatory Visit: Payer: Self-pay

## 2023-05-23 DIAGNOSIS — H43813 Vitreous degeneration, bilateral: Secondary | ICD-10-CM

## 2023-05-23 DIAGNOSIS — H35033 Hypertensive retinopathy, bilateral: Secondary | ICD-10-CM | POA: Diagnosis not present

## 2023-05-23 DIAGNOSIS — H34812 Central retinal vein occlusion, left eye, with macular edema: Secondary | ICD-10-CM | POA: Diagnosis not present

## 2023-05-23 DIAGNOSIS — E113311 Type 2 diabetes mellitus with moderate nonproliferative diabetic retinopathy with macular edema, right eye: Secondary | ICD-10-CM | POA: Diagnosis not present

## 2023-05-23 DIAGNOSIS — Z7984 Long term (current) use of oral hypoglycemic drugs: Secondary | ICD-10-CM | POA: Diagnosis not present

## 2023-05-23 DIAGNOSIS — I10 Essential (primary) hypertension: Secondary | ICD-10-CM

## 2023-05-23 DIAGNOSIS — E113512 Type 2 diabetes mellitus with proliferative diabetic retinopathy with macular edema, left eye: Secondary | ICD-10-CM

## 2023-05-23 NOTE — Patient Outreach (Signed)
  Care Coordination   05/23/2023 Name: ARMINE JAEN MRN: [id number] DOB: 12-Jan-1956   Care Coordination Outreach Attempts:  An unsuccessful telephone outreach was attempted for a scheduled appointment today.  Follow Up Plan:  Additional outreach attempts will be made to offer the patient care coordination information and services.   Encounter Outcome:  No Answer   Care Coordination Interventions:  No, not indicated    Delsa Sale RN BSN CCM [place]  [facility], [facility] Nurse Care Coordinator  Direct Dial: [phone] Website: [email or web address]

## 2023-05-29 ENCOUNTER — Telehealth: Payer: Self-pay | Admitting: *Deleted

## 2023-05-29 NOTE — Progress Notes (Signed)
Care Coordination Note  05/29/2023 Name: [name] MRN: [id number] DOB: 20-Apr-1956  [name] is a 67 y.o. year old female who is a primary care patient of Arnette Felts, FNP and is actively engaged with the care management team. I reached out to Lucianne Lei by phone today to assist with re-scheduling a follow up visit with the RN Case Manager  Follow up plan: Telephone appointment with care management team member scheduled for:06/13/23  [facility] Coordination Care Guide  Direct Dial: [phone]

## 2023-05-29 NOTE — Progress Notes (Signed)
Care Coordination Note  05/29/2023 Name: [name] MRN: [id number] DOB: 03/10/1956  [name] is a 67 y.o. year old female who is a primary care patient of Arnette Felts, FNP and is actively engaged with the care management team. I reached out to Lucianne Lei by phone today to assist with re-scheduling a follow up visit with the RN Case Manager  Follow up plan: Unsuccessful telephone outreach attempt made. A HIPAA compliant phone message was left for the patient providing contact information and requesting a return call.   [facility]  Care Coordination Care Guide  Direct Dial: [phone]

## 2023-06-05 ENCOUNTER — Ambulatory Visit: Payer: Medicare PPO | Admitting: Nurse Practitioner

## 2023-06-05 ENCOUNTER — Encounter: Payer: Self-pay | Admitting: Nurse Practitioner

## 2023-06-05 ENCOUNTER — Telehealth: Payer: Self-pay

## 2023-06-05 VITALS — BP 112/80 | HR 69 | Temp 98.9°F | Ht 66.0 in | Wt 140.4 lb

## 2023-06-05 DIAGNOSIS — R630 Anorexia: Secondary | ICD-10-CM

## 2023-06-05 DIAGNOSIS — R801 Persistent proteinuria, unspecified: Secondary | ICD-10-CM | POA: Diagnosis not present

## 2023-06-05 DIAGNOSIS — R110 Nausea: Secondary | ICD-10-CM | POA: Diagnosis not present

## 2023-06-05 DIAGNOSIS — R12 Heartburn: Secondary | ICD-10-CM | POA: Diagnosis not present

## 2023-06-05 DIAGNOSIS — R251 Tremor, unspecified: Secondary | ICD-10-CM

## 2023-06-05 DIAGNOSIS — R5383 Other fatigue: Secondary | ICD-10-CM

## 2023-06-05 DIAGNOSIS — K921 Melena: Secondary | ICD-10-CM

## 2023-06-05 MED ORDER — OMEPRAZOLE 20 MG PO CPDR: 20.0000 mg | DELAYED_RELEASE_CAPSULE | Freq: Every day | ORAL | 2 refills | Status: DC

## 2023-06-10 ENCOUNTER — Telehealth: Payer: Self-pay

## 2023-06-10 ENCOUNTER — Encounter: Payer: Self-pay | Admitting: Nurse Practitioner

## 2023-06-10 NOTE — Telephone Encounter (Signed)
Patient called and reported that she is still unable to eat, patient reported she was feeling worse. CT is scheduled for 10/11, patient was advised since she is worse go to ER. Labs from visit still waiting to come back.

## 2023-06-10 NOTE — Telephone Encounter (Signed)
ERROR

## 2023-06-12 DIAGNOSIS — R630 Anorexia: Secondary | ICD-10-CM | POA: Insufficient documentation

## 2023-06-12 DIAGNOSIS — R801 Persistent proteinuria, unspecified: Secondary | ICD-10-CM | POA: Insufficient documentation

## 2023-06-12 DIAGNOSIS — R5383 Other fatigue: Secondary | ICD-10-CM | POA: Insufficient documentation

## 2023-06-12 DIAGNOSIS — R12 Heartburn: Secondary | ICD-10-CM | POA: Insufficient documentation

## 2023-06-12 DIAGNOSIS — K921 Melena: Secondary | ICD-10-CM | POA: Insufficient documentation

## 2023-06-12 DIAGNOSIS — R251 Tremor, unspecified: Secondary | ICD-10-CM | POA: Insufficient documentation

## 2023-06-12 DIAGNOSIS — R110 Nausea: Secondary | ICD-10-CM | POA: Insufficient documentation

## 2023-06-12 LAB — IRON,TIBC AND FERRITIN PANEL
Ferritin: 57 ng/mL (ref 15–150)
Iron Saturation: 12 % — ABNORMAL LOW (ref 15–55)
Iron: 36 ug/dL (ref 27–139)
Total Iron Binding Capacity: 300 ug/dL (ref 250–450)
UIBC: 264 ug/dL (ref 118–369)

## 2023-06-12 LAB — CMP14+EGFR
ALT: 6 [IU]/L (ref 0–32)
AST: 15 [IU]/L (ref 0–40)
Albumin: 4 g/dL (ref 3.9–4.9)
Alkaline Phosphatase: 78 [IU]/L (ref 44–121)
BUN/Creatinine Ratio: 20 (ref 12–28)
BUN: 20 mg/dL (ref 8–27)
Bilirubin Total: <0.2 mg/dL (ref 0.0–1.2)
CO2: 25 mmol/L (ref 20–29)
Calcium: 9.8 mg/dL (ref 8.7–10.3)
Chloride: 101 mmol/L (ref 96–106)
Creatinine, Ser: 1 mg/dL (ref 0.57–1.00)
Globulin, Total: 2.9 g/dL (ref 1.5–4.5)
Glucose: 79 mg/dL (ref 70–99)
Potassium: 4.7 mmol/L (ref 3.5–5.2)
Sodium: 139 mmol/L (ref 134–144)
Total Protein: 6.9 g/dL (ref 6.0–8.5)
eGFR: 62 mL/min/{1.73_m2} (ref >59)

## 2023-06-12 LAB — CBC WITH DIFFERENTIAL/PLATELET
Basophils Absolute: 0.1 10*3/uL (ref 0.0–0.2)
Basos: 1 %
EOS (ABSOLUTE): 0.2 10*3/uL (ref 0.0–0.4)
Eos: 2 %
Hematocrit: 41 % (ref 34.0–46.6)
Hemoglobin: 12.2 g/dL (ref 11.1–15.9)
Immature Grans (Abs): 0 10*3/uL (ref 0.0–0.1)
Immature Granulocytes: 0 %
Lymphocytes Absolute: 1.8 10*3/uL (ref 0.7–3.1)
Lymphs: 17 %
MCH: 23.6 pg — ABNORMAL LOW (ref 26.6–33.0)
MCHC: 29.8 g/dL — ABNORMAL LOW (ref 31.5–35.7)
MCV: 79 fL (ref 79–97)
Monocytes Absolute: 0.8 10*3/uL (ref 0.1–0.9)
Monocytes: 8 %
Neutrophils Absolute: 7.4 10*3/uL — ABNORMAL HIGH (ref 1.4–7.0)
Neutrophils: 72 %
Platelets: 358 10*3/uL (ref 150–450)
RBC: 5.18 x10E6/uL (ref 3.77–5.28)
RDW: 15 % (ref 11.7–15.4)
WBC: 10.1 10*3/uL (ref 3.4–10.8)

## 2023-06-12 LAB — MULTIPLE MYELOMA PANEL, SERUM
Albumin SerPl Elph-Mcnc: 3.5 g/dL (ref 2.9–4.4)
Albumin/Glob SerPl: 1.1 (ref 0.7–1.7)
Alpha 1: 0.3 g/dL (ref 0.0–0.4)
Alpha2 Glob SerPl Elph-Mcnc: 0.8 g/dL (ref 0.4–1.0)
B-Globulin SerPl Elph-Mcnc: 1.1 g/dL (ref 0.7–1.3)
Gamma Glob SerPl Elph-Mcnc: 1.2 g/dL (ref 0.4–1.8)
Globulin, Total: 3.4 g/dL (ref 2.2–3.9)
IgA/Immunoglobulin A, Serum: 397 mg/dL — ABNORMAL HIGH (ref 87–352)
IgG (Immunoglobin G), Serum: 1239 mg/dL (ref 586–1602)
IgM (Immunoglobulin M), Srm: 79 mg/dL (ref 26–217)

## 2023-06-12 LAB — HIV ANTIBODY (ROUTINE TESTING W REFLEX): HIV Screen 4th Generation wRfx: NONREACTIVE

## 2023-06-12 LAB — VITAMIN D 25 HYDROXY (VIT D DEFICIENCY, FRACTURES): Vit D, 25-Hydroxy: 71.1 ng/mL (ref 30.0–100.0)

## 2023-06-12 LAB — VITAMIN B12: Vitamin B-12: 680 pg/mL (ref 232–1245)

## 2023-06-12 NOTE — Assessment & Plan Note (Signed)
Will treat with omeprazole.

## 2023-06-12 NOTE — Assessment & Plan Note (Addendum)
Will check for low blood sugar and additional labs

## 2023-06-12 NOTE — Assessment & Plan Note (Signed)
I am not sure why she has a decreased appetite. Will order a CT scan abdomen.

## 2023-06-13 ENCOUNTER — Other Ambulatory Visit: Payer: Self-pay | Admitting: Nurse Practitioner

## 2023-06-13 ENCOUNTER — Ambulatory Visit: Payer: Self-pay

## 2023-06-13 DIAGNOSIS — R630 Anorexia: Secondary | ICD-10-CM

## 2023-06-13 DIAGNOSIS — R799 Abnormal finding of blood chemistry, unspecified: Secondary | ICD-10-CM

## 2023-06-13 DIAGNOSIS — R5383 Other fatigue: Secondary | ICD-10-CM

## 2023-06-13 MED ORDER — FERROUS SULFATE 325 (65 FE) MG PO TABS: 325.0000 mg | ORAL_TABLET | Freq: Two times a day (BID) | ORAL | 3 refills | Status: DC

## 2023-06-13 NOTE — Patient Outreach (Signed)
  Care Coordination   06/13/2023 Name: [name] MRN: [id number] DOB: 1955-12-02   Care Coordination Outreach Attempts:  An unsuccessful telephone outreach was attempted for a scheduled appointment today.  Follow Up Plan:  Additional outreach attempts will be made to offer the patient care coordination information and services.   Encounter Outcome:  No Answer   Care Coordination Interventions:  No, not indicated    Delsa Sale RN BSN CCM [place]  [facility], [facility] Nurse Care Coordinator  Direct Dial: [phone] Website: [email or web address]

## 2023-06-18 NOTE — Assessment & Plan Note (Signed)
None present with stool card.

## 2023-06-18 NOTE — Assessment & Plan Note (Signed)
Will check for metabolic cause.

## 2023-06-18 NOTE — Assessment & Plan Note (Signed)
Will check multiple myeloma panel and consider referral to Nephrology

## 2023-06-20 ENCOUNTER — Encounter: Payer: Self-pay | Admitting: Nurse Practitioner

## 2023-06-20 ENCOUNTER — Ambulatory Visit: Payer: Medicare PPO | Admitting: Physician Assistant

## 2023-06-20 ENCOUNTER — Encounter: Payer: Self-pay | Admitting: Physician Assistant

## 2023-06-20 VITALS — BP 120/60 | HR 56 | Ht 65.0 in | Wt 137.0 lb

## 2023-06-20 DIAGNOSIS — R630 Anorexia: Secondary | ICD-10-CM | POA: Diagnosis not present

## 2023-06-20 DIAGNOSIS — K219 Gastro-esophageal reflux disease without esophagitis: Secondary | ICD-10-CM | POA: Diagnosis not present

## 2023-06-20 DIAGNOSIS — R634 Abnormal weight loss: Secondary | ICD-10-CM | POA: Diagnosis not present

## 2023-06-20 DIAGNOSIS — D509 Iron deficiency anemia, unspecified: Secondary | ICD-10-CM | POA: Diagnosis not present

## 2023-06-20 DIAGNOSIS — R194 Change in bowel habit: Secondary | ICD-10-CM

## 2023-06-20 MED ORDER — NA SULFATE-K SULFATE-MG SULF 17.5-3.13-1.6 GM/177ML PO SOLN
1.0000 | Freq: Once | ORAL | 0 refills | Status: AC
Start: 2023-06-20 — End: 2023-06-20

## 2023-06-20 NOTE — Patient Instructions (Signed)
Continue your iron and omeprazole.  Just hold your iron 5 days prior to your procedure.  You have been scheduled for an endoscopy and colonoscopy. Please follow the written instructions given to you at your visit today.  Please pick up your prep supplies at the pharmacy within the next 1-3 days.  If you use inhalers (even only as needed), please bring them with you on the day of your procedure.  DO NOT TAKE 7 DAYS PRIOR TO TEST- Trulicity (dulaglutide) Ozempic, Wegovy (semaglutide) Mounjaro (tirzepatide) Bydureon Bcise (exanatide extended release)  DO NOT TAKE 1 DAY PRIOR TO YOUR TEST Rybelsus (semaglutide) Adlyxin (lixisenatide) Victoza (liraglutide) Byetta (exanatide) ___________________________________________________________________________  I appreciate the opportunity to care for you. Hyacinth Meeker PA-C

## 2023-06-20 NOTE — Progress Notes (Signed)
I agree with the assessment and plan as outlined by Ms. Lemmon. 
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
## 2023-06-21 ENCOUNTER — Ambulatory Visit
Admission: RE | Admit: 2023-06-21 | Discharge: 2023-06-21 | Disposition: A | Discharge status: Home / Self Care | Payer: Medicare PPO | Source: Ambulatory Visit | Attending: Nurse Practitioner | Admitting: Nurse Practitioner

## 2023-06-21 DIAGNOSIS — R630 Anorexia: Secondary | ICD-10-CM

## 2023-06-21 DIAGNOSIS — R12 Heartburn: Secondary | ICD-10-CM

## 2023-06-21 DIAGNOSIS — K921 Melena: Secondary | ICD-10-CM

## 2023-06-21 DIAGNOSIS — R110 Nausea: Secondary | ICD-10-CM

## 2023-06-21 DIAGNOSIS — R5383 Other fatigue: Secondary | ICD-10-CM

## 2023-06-21 DIAGNOSIS — N200 Calculus of kidney: Secondary | ICD-10-CM | POA: Diagnosis not present

## 2023-06-21 DIAGNOSIS — R634 Abnormal weight loss: Secondary | ICD-10-CM | POA: Diagnosis not present

## 2023-06-21 MED ORDER — IOPAMIDOL (ISOVUE-300) INJECTION 61%
100.0000 mL | Freq: Once | INTRAVENOUS | Status: AC | PRN
  Administered 2023-06-21: 100 mL via INTRAVENOUS

## 2023-06-24 NOTE — Progress Notes (Signed)
Referral received for appointment, sent request to new patient scheduler.

## 2023-06-25 ENCOUNTER — Telehealth: Payer: Self-pay | Admitting: Hematology

## 2023-06-25 NOTE — Telephone Encounter (Signed)
Called and left VM for patient to call back to schedule an appointment.

## 2023-06-26 ENCOUNTER — Other Ambulatory Visit: Payer: Self-pay | Admitting: Nurse Practitioner

## 2023-06-26 ENCOUNTER — Telehealth: Payer: Self-pay | Admitting: *Deleted

## 2023-06-26 NOTE — Progress Notes (Signed)
  Care Coordination Note  06/26/2023 Name: SAVANNAH MORFORD MRN: [id number] DOB: 03/06/1956  NANCY ARVIN is a 67 y.o. year old female who is a primary care patient of Arnette Felts, FNP and is actively engaged with the care management team. I reached out to Lucianne Lei by phone today to assist with re-scheduling a follow up visit with the RN Case Manager  Follow up plan: Telephone appointment with care management team member scheduled for:07/03/23  [facility] Coordination Care Guide  Direct Dial: [phone]

## 2023-06-26 NOTE — Progress Notes (Signed)
  Care Coordination Note  06/26/2023 Name: [name] MRN: [id number] DOB: August 24, 1956  [name] is a 67 y.o. year old female who is a primary care patient of Arnette Felts, FNP and is actively engaged with the care management team. I reached out to Lucianne Lei by phone today to assist with re-scheduling a follow up visit with the RN Case Manager  Follow up plan: Unsuccessful telephone outreach attempt made. A HIPAA compliant phone message was left for the patient providing contact information and requesting a return call.   [facility]  Care Coordination Care Guide  Direct Dial: [phone]

## 2023-06-27 ENCOUNTER — Telehealth: Payer: Self-pay | Admitting: Hematology

## 2023-06-27 NOTE — Telephone Encounter (Signed)
Scheduled appointments per referral. Patient is aware of the appointment time and date as well as the address. Patient was informed to arrive 10-15 minutes prior with updated insurance information. All questions were answered.

## 2023-07-01 ENCOUNTER — Ambulatory Visit: Payer: Medicare PPO | Admitting: Internal Medicine

## 2023-07-01 ENCOUNTER — Encounter: Payer: Self-pay | Admitting: Internal Medicine

## 2023-07-01 VITALS — BP 153/81 | HR 63 | Temp 97.5°F | Resp 18 | Ht 65.0 in | Wt 137.0 lb

## 2023-07-01 DIAGNOSIS — B9681 Helicobacter pylori [H. pylori] as the cause of diseases classified elsewhere: Secondary | ICD-10-CM

## 2023-07-01 DIAGNOSIS — K2970 Gastritis, unspecified, without bleeding: Secondary | ICD-10-CM

## 2023-07-01 DIAGNOSIS — K209 Esophagitis, unspecified without bleeding: Secondary | ICD-10-CM

## 2023-07-01 DIAGNOSIS — R194 Change in bowel habit: Secondary | ICD-10-CM | POA: Diagnosis not present

## 2023-07-01 DIAGNOSIS — D120 Benign neoplasm of cecum: Secondary | ICD-10-CM | POA: Diagnosis not present

## 2023-07-01 DIAGNOSIS — K2981 Duodenitis with bleeding: Secondary | ICD-10-CM | POA: Diagnosis not present

## 2023-07-01 DIAGNOSIS — D123 Benign neoplasm of transverse colon: Secondary | ICD-10-CM | POA: Diagnosis not present

## 2023-07-01 DIAGNOSIS — K635 Polyp of colon: Secondary | ICD-10-CM | POA: Diagnosis not present

## 2023-07-01 DIAGNOSIS — E119 Type 2 diabetes mellitus without complications: Secondary | ICD-10-CM | POA: Diagnosis not present

## 2023-07-01 DIAGNOSIS — K219 Gastro-esophageal reflux disease without esophagitis: Secondary | ICD-10-CM | POA: Diagnosis not present

## 2023-07-01 DIAGNOSIS — D509 Iron deficiency anemia, unspecified: Secondary | ICD-10-CM | POA: Diagnosis not present

## 2023-07-01 DIAGNOSIS — K222 Esophageal obstruction: Secondary | ICD-10-CM

## 2023-07-01 DIAGNOSIS — K2971 Gastritis, unspecified, with bleeding: Secondary | ICD-10-CM | POA: Diagnosis not present

## 2023-07-01 DIAGNOSIS — I10 Essential (primary) hypertension: Secondary | ICD-10-CM | POA: Diagnosis not present

## 2023-07-01 DIAGNOSIS — B3781 Candidal esophagitis: Secondary | ICD-10-CM | POA: Diagnosis not present

## 2023-07-01 DIAGNOSIS — K529 Noninfective gastroenteritis and colitis, unspecified: Secondary | ICD-10-CM | POA: Diagnosis not present

## 2023-07-01 MED ORDER — SODIUM CHLORIDE 0.9 % IV SOLN: 500.0000 mL | INTRAVENOUS | Status: AC

## 2023-07-01 MED ORDER — OMEPRAZOLE 40 MG PO CPDR: 40.0000 mg | DELAYED_RELEASE_CAPSULE | Freq: Two times a day (BID) | ORAL | 0 refills | Status: DC

## 2023-07-01 MED ORDER — DIFLUCAN 100 MG PO TABS: 400.0000 mg | ORAL_TABLET | Freq: Once | ORAL | 0 refills | Status: AC

## 2023-07-01 NOTE — Progress Notes (Signed)
Called to room to assist during endoscopic procedure.  Patient ID and intended procedure confirmed with present staff. Received instructions for my participation in the procedure from the performing physician.  

## 2023-07-01 NOTE — Op Note (Signed)
[facility] Patient Name: [name] Procedure Date: 07/01/2023 3:34 PM MRN: [id number] Endoscopist: Madelyn Brunner [place] , , [id number] Age: 67 Referring MD:  Date of Birth: 12/07/1955 Gender: Female Account #: [id number] Procedure:                Upper GI endoscopy Indications:              Iron deficiency anemia, Heartburn, Weight loss Medicines:                Monitored Anesthesia Care Procedure:                Pre-Anesthesia Assessment:                           - Prior to the procedure, a History and Physical                            was performed, and patient medications and                            allergies were reviewed. The patient's tolerance of                            previous anesthesia was also reviewed. The risks                            and benefits of the procedure and the sedation                            options and risks were discussed with the patient.                            All questions were answered, and informed consent                            was obtained. Prior Anticoagulants: The patient has                            taken no anticoagulant or antiplatelet agents. ASA                            Grade Assessment: III - A patient with severe                            systemic disease. After reviewing the risks and                            benefits, the patient was deemed in satisfactory                            condition to undergo the procedure.                           After obtaining informed consent, the endoscope was  passed under direct vision. Throughout the                            procedure, the patient's blood pressure, pulse, and                            oxygen saturations were monitored continuously. The                            Olympus Scope [id number] was introduced through the                            mouth, and advanced to the second part of duodenum.                             The upper GI endoscopy was accomplished without                            difficulty. The patient tolerated the procedure                            well. Scope In: Scope Out: Findings:                 Diffuse, white plaques were found in the entire                            esophagus. Biopsies were taken with a cold forceps                            for histology.                           One benign-appearing, intrinsic moderate                            (circumferential scarring or stenosis; an endoscope                            may pass but only after pushing with resistance)                            stenosis was found in the proximal esophagus. This                            stenosis measured 5 cm (in length). The stenosis                            was traversed after pushing with resistance.                            Mucosal disruption was noted after endoscope passed                            through the stricture.  Localized severe inflammation characterized by                            congestion (edema), erosions, erythema, granularity                            and mucus was found in the cardia. Biopsies were                            taken of this area as well as the rest of the                            stomach with a cold forceps for histology.                           Localized nodular mucosa was found in the duodenal                            bulb and in the second portion of the duodenum.                            Biopsies were taken with a cold forceps for                            histology.                           Localized mucosal variance characterized by                            congestion was found in the ampulla. Biopsies were                            taken with a cold forceps for histology.                           Biopsies for histology were taken with a cold                            forceps in the  duodenal bulb and in the second                            portion of the duodenum for evaluation of celiac                            disease. Complications:            No immediate complications. Estimated Blood Loss:     Estimated blood loss was minimal. Impression:               - Esophageal plaques were found, suspicious for                            candidiasis. Biopsied.                           -  Benign-appearing esophageal stenosis.                           - Gastritis. Biopsied.                           - Nodular mucosa in the duodenal bulb and in the                            second portion of the duodenum. Biopsied.                           - Mucosal variant in the duodenum. Biopsied.                           - Biopsies were taken with a cold forceps for                            evaluation of celiac disease. Recommendation:           - Use Prilosec (omeprazole) 40 mg PO BID for 10                            weeks.                           - Fluconazole 400 mg x 1 dose, followed by 200 mg                            QD for 13 days                           - Repeat upper endoscopy in 2 months to check                            healing.                           - Await pathology results.                           - Perform a colonoscopy today. Dr Particia Lather "Alan Ripper" Leonides Schanz,  07/01/2023 4:48:14 PM
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
## 2023-07-01 NOTE — Progress Notes (Unsigned)
Uneventful anesthetic. Report to pacu rn. Vss. Care resumed by rn. 

## 2023-07-01 NOTE — Op Note (Signed)
[facility] Patient Name: [name] Procedure Date: 07/01/2023 3:34 PM MRN: [id number] Endoscopist: Madelyn Brunner Sumrall , , [id number] Age: 67 Referring MD:  Date of Birth: Jan 27, 1956 Gender: Female Account #: [id number] Procedure:                Colonoscopy Indications:              Iron deficiency anemia, Change in bowel habits,                            Weight loss Medicines:                Monitored Anesthesia Care Procedure:                Pre-Anesthesia Assessment:                           - Prior to the procedure, a History and Physical                            was performed, and patient medications and                            allergies were reviewed. The patient's tolerance of                            previous anesthesia was also reviewed. The risks                            and benefits of the procedure and the sedation                            options and risks were discussed with the patient.                            All questions were answered, and informed consent                            was obtained. Prior Anticoagulants: The patient has                            taken no anticoagulant or antiplatelet agents. ASA                            Grade Assessment: III - A patient with severe                            systemic disease. After reviewing the risks and                            benefits, the patient was deemed in satisfactory                            condition to undergo the procedure.  After obtaining informed consent, the colonoscope                            was passed under direct vision. Throughout the                            procedure, the patient's blood pressure, pulse, and                            oxygen saturations were monitored continuously. The                            Olympus Scope [id number] was introduced through the                            anus and advanced to the the terminal  ileum. The                            colonoscopy was performed without difficulty. The                            patient tolerated the procedure well. The quality                            of the bowel preparation was fair. The terminal                            ileum, ileocecal valve, appendiceal orifice, and                            rectum were photographed. Scope In: 4:01:36 PM Scope Out: 4:38:49 PM Scope Withdrawal Time: 0 hours 28 minutes 57 seconds  Total Procedure Duration: 0 hours 37 minutes 13 seconds  Findings:                 The terminal ileum appeared normal.                           A 15 mm polyp was found in the cecum. The polyp was                            sessile. The polyp was removed with a cold snare.                            Resection and retrieval were complete.                           Three sessile polyps were found in the transverse                            colon. The polyps were 4 to 18 mm in size. These                            polyps were  removed with a cold snare. Resection                            and retrieval were complete.                           Multiple diverticula were found in the sigmoid                            colon.                           Non-bleeding internal hemorrhoids were found during                            retroflexion. Complications:            No immediate complications. Estimated Blood Loss:     Estimated blood loss was minimal. Impression:               - Preparation of the colon was fair.                           - The examined portion of the ileum was normal.                           - One 15 mm polyp in the cecum, removed with a cold                            snare. Resected and retrieved.                           - Three 4 to 18 mm polyps in the transverse colon,                            removed with a cold snare. Resected and retrieved.                           - Diverticulosis in the sigmoid  colon.                           - Non-bleeding internal hemorrhoids. Recommendation:           - Discharge patient to home (with escort).                           - Await pathology results.                           - The findings and recommendations were discussed                            with the patient. Dr Particia Lather "Alan Ripper" Leonides Schanz,  07/01/2023 4:52:46 PM

## 2023-07-01 NOTE — Patient Instructions (Addendum)
Upper Endoscopy:  FOLLOW POST DILATATION DIET - HANDOUT GIVEN TO YOU TODAY  Handouts on esophagitis & gastritis given to you today  Await pathology results on Gastric,Esophageal & duodenal biopsies   Follow up appointment for repeat Endoscopy scheduled with Dr Leonides Schanz to check for healing - see page 3 for appointment date & time - instructions for this Upper Endoscopy given to you BY H CARR,RN  2 new medications orders sent to your pharmacy for you to pick up - Fluconazole & Prilosec see medication list-     Colonoscopy:  Handouts on polyps,diverticulosis,& hemorrhoids given to you today.   Await pathology results on polyps removed     YOU HAD AN ENDOSCOPIC PROCEDURE TODAY AT THE [facility]:   Refer to the procedure report that was given to you for any specific questions about what was found during the examination.  If the procedure report does not answer your questions, please call your gastroenterologist to clarify.  If you requested that your care partner not be given the details of your procedure findings, then the procedure report has been included in a sealed envelope for you to review at your convenience later.  YOU SHOULD EXPECT: Some feelings of bloating in the abdomen. Passage of more gas than usual.  Walking can help get rid of the air that was put into your GI tract during the procedure and reduce the bloating. If you had a lower endoscopy (such as a colonoscopy or flexible sigmoidoscopy) you may notice spotting of blood in your stool or on the toilet paper. If you underwent a bowel prep for your procedure, you may not have a normal bowel movement for a few days.  Please Note:  You might notice some irritation and congestion in your nose or some drainage.  This is from the oxygen used during your procedure.  There is no need for concern and it should clear up in a day or so.  SYMPTOMS TO REPORT IMMEDIATELY:  Following lower endoscopy (colonoscopy or flexible  sigmoidoscopy):  Excessive amounts of blood in the stool  Significant tenderness or worsening of abdominal pains  Swelling of the abdomen that is new, acute  Fever of 100F or higher  Following upper endoscopy (EGD)  Vomiting of blood or coffee ground material  New chest pain or pain under the shoulder blades  Painful or persistently difficult swallowing  New shortness of breath  Fever of 100F or higher  Black, tarry-looking stools  For urgent or emergent issues, a gastroenterologist can be reached at any hour by calling [phone]. Do not use MyChart messaging for urgent concerns.    DIET:  FOLLOW DILATATION DIET GIVEN TO YOU TODAY  Drink plenty of fluids but you should avoid alcoholic beverages for 24 hours.  ACTIVITY:  You should plan to take it easy for the rest of today and you should NOT DRIVE or use heavy machinery until tomorrow (because of the sedation medicines used during the test).    FOLLOW UP: Our staff will call the number listed on your records the next business day following your procedure.  We will call around 7:15- 8:00 am to check on you and address any questions or concerns that you may have regarding the information given to you following your procedure. If we do not reach you, we will leave a message.     If any biopsies were taken you will be contacted by phone or by letter within the next 1-3 weeks.  Please  call us at [phone] if you have not heard about the biopsies in 3 weeks.    SIGNATURES/CONFIDENTIALITY: You and/or your care partner have signed paperwork which will be entered into your electronic medical record.  These signatures attest to the fact that that the information above on your After Visit Summary has been reviewed and is understood.  Full responsibility of the confidentiality of this discharge information lies with you and/or your care-partner.

## 2023-07-01 NOTE — Progress Notes (Signed)
REPEAT ENDOSCOPY SCHEDULED AND MEDICATIONS PUT IN BY Idelle Jo RN

## 2023-07-02 ENCOUNTER — Telehealth: Payer: Self-pay

## 2023-07-02 NOTE — Telephone Encounter (Signed)
Attempted f/u call. No answer, left VM. 

## 2023-07-03 ENCOUNTER — Ambulatory Visit: Payer: Self-pay

## 2023-07-03 NOTE — Patient Outreach (Signed)
  Care Coordination   07/03/2023 Name: [name] MRN: [id number] DOB: 12/14/1955   Care Coordination Outreach Attempts:  An unsuccessful telephone outreach was attempted for a scheduled appointment today.  Follow Up Plan:  No further outreach attempts will be made at this time. We have been unable to contact the patient to offer or enroll patient in care coordination services  Encounter Outcome:  No Answer   Care Coordination Interventions:  No, not indicated    Delsa Sale RN BSN CCM [place]  [facility], [facility] Nurse Care Coordinator  Direct Dial: [phone] Website: [email or web address]

## 2023-07-04 ENCOUNTER — Encounter (INDEPENDENT_AMBULATORY_CARE_PROVIDER_SITE_OTHER): Payer: Medicare PPO | Admitting: Ophthalmology

## 2023-07-04 DIAGNOSIS — H34812 Central retinal vein occlusion, left eye, with macular edema: Secondary | ICD-10-CM | POA: Diagnosis not present

## 2023-07-04 DIAGNOSIS — E113311 Type 2 diabetes mellitus with moderate nonproliferative diabetic retinopathy with macular edema, right eye: Secondary | ICD-10-CM | POA: Diagnosis not present

## 2023-07-04 DIAGNOSIS — E113512 Type 2 diabetes mellitus with proliferative diabetic retinopathy with macular edema, left eye: Secondary | ICD-10-CM

## 2023-07-04 DIAGNOSIS — H43813 Vitreous degeneration, bilateral: Secondary | ICD-10-CM | POA: Diagnosis not present

## 2023-07-04 DIAGNOSIS — I10 Essential (primary) hypertension: Secondary | ICD-10-CM | POA: Diagnosis not present

## 2023-07-04 DIAGNOSIS — H35372 Puckering of macula, left eye: Secondary | ICD-10-CM | POA: Diagnosis not present

## 2023-07-04 DIAGNOSIS — Z7984 Long term (current) use of oral hypoglycemic drugs: Secondary | ICD-10-CM

## 2023-07-04 DIAGNOSIS — H2513 Age-related nuclear cataract, bilateral: Secondary | ICD-10-CM | POA: Diagnosis not present

## 2023-07-04 DIAGNOSIS — H35033 Hypertensive retinopathy, bilateral: Secondary | ICD-10-CM

## 2023-07-05 ENCOUNTER — Encounter: Payer: Self-pay | Admitting: Internal Medicine

## 2023-07-05 ENCOUNTER — Other Ambulatory Visit: Payer: Self-pay

## 2023-07-05 LAB — SURGICAL PATHOLOGY

## 2023-07-05 MED ORDER — FLUCONAZOLE 200 MG PO TABS: ORAL_TABLET | ORAL | 0 refills | Status: DC

## 2023-07-05 NOTE — Progress Notes (Signed)
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 Her esophageal biopsies also confirmed the presence of candida yeast infection. I would recommend that she complete her course of fluconazole therapy for the yeast infection first (I have already prescribed her this) and then she start bismuth quadruple therapy for treatment of her H pylori: - Tetracycline 500 mg TID x 14 days - Flagyl 250 mg QID x 14 days - Bismuth subsalicylate 524 mg QID x 14 days - PPI BID x 14 days  I will plan to test for eradication of H pylori during her upcoming upper endoscopy procedure.

## 2023-07-06 ENCOUNTER — Other Ambulatory Visit: Payer: Self-pay | Admitting: Nurse Practitioner

## 2023-07-06 DIAGNOSIS — I2510 Atherosclerotic heart disease of native coronary artery without angina pectoris: Secondary | ICD-10-CM

## 2023-07-08 ENCOUNTER — Other Ambulatory Visit: Payer: Self-pay

## 2023-07-08 MED ORDER — TETRACYCLINE HCL 500 MG PO CAPS: 500.0000 mg | ORAL_CAPSULE | Freq: Three times a day (TID) | ORAL | 0 refills | Status: AC

## 2023-07-08 MED ORDER — METRONIDAZOLE 250 MG PO TABS
250.0000 mg | ORAL_TABLET | Freq: Four times a day (QID) | ORAL | 0 refills | Status: AC
Start: 2023-07-08 — End: 2023-07-22

## 2023-07-08 MED ORDER — BISMUTH SUBSALICYLATE 262 MG PO TABS: 2.0000 | ORAL_TABLET | Freq: Four times a day (QID) | ORAL | 0 refills | Status: AC

## 2023-07-09 ENCOUNTER — Telehealth: Payer: Self-pay | Admitting: Internal Medicine

## 2023-07-09 NOTE — Telephone Encounter (Signed)
PT returning call to discuss results. Please advise

## 2023-07-09 NOTE — Telephone Encounter (Signed)
Called the patient back. No answer. Got her voicemail. Left her a message to let me know if she had questions regarding her results. Patient has reviewed the message in My Chart.

## 2023-07-14 ENCOUNTER — Other Ambulatory Visit: Payer: Self-pay | Admitting: Nurse Practitioner

## 2023-07-14 DIAGNOSIS — R935 Abnormal findings on diagnostic imaging of other abdominal regions, including retroperitoneum: Secondary | ICD-10-CM

## 2023-07-16 ENCOUNTER — Telehealth: Payer: Self-pay

## 2023-07-16 ENCOUNTER — Other Ambulatory Visit: Payer: Self-pay | Admitting: Nurse Practitioner

## 2023-07-16 DIAGNOSIS — E559 Vitamin D deficiency, unspecified: Secondary | ICD-10-CM

## 2023-07-16 DIAGNOSIS — R7303 Prediabetes: Secondary | ICD-10-CM

## 2023-07-16 NOTE — Telephone Encounter (Signed)
Vmt requesting call back with interest to start PREP on 07/30/23 at [facility] T/TH [id number]

## 2023-07-19 ENCOUNTER — Telehealth: Payer: Self-pay | Admitting: Hematology

## 2023-07-19 NOTE — Telephone Encounter (Signed)
Rescheduled appointment per provider. Patient is aware of the changes made to her upcoming appointments.

## 2023-07-23 ENCOUNTER — Ambulatory Visit: Payer: Self-pay

## 2023-07-23 ENCOUNTER — Telehealth: Payer: Self-pay

## 2023-07-23 NOTE — Telephone Encounter (Signed)
VMT pt requesting call back confirm starting PREP on 07/30/23. Would also to schedule time to do initial assessment

## 2023-07-23 NOTE — Patient Outreach (Addendum)
  Care Coordination   07/23/2023 Name: [name] MRN: [id number] DOB: 07-Jan-1956   Care Coordination Outreach Attempts:  An unsuccessful telephone outreach was attempted for a scheduled appointment today.  Follow Up Plan:  No further outreach attempts will be made at this time. We have been unable to contact the patient to offer or enroll patient in care coordination services  Encounter Outcome:  No Answer   Care Coordination Interventions:  No, not indicated    Delsa Sale RN BSN CCM [place]  [facility], [facility] Nurse Care Coordinator  Direct Dial: [phone] Website: [email or web address]

## 2023-07-24 ENCOUNTER — Encounter: Payer: Self-pay | Admitting: Nurse Practitioner

## 2023-07-24 NOTE — Telephone Encounter (Signed)
Care team updated and letter sent for eye exam notes.

## 2023-07-25 ENCOUNTER — Ambulatory Visit
Admission: RE | Admit: 2023-07-25 | Discharge: 2023-07-25 | Disposition: A | Discharge status: Home / Self Care | Payer: Medicare PPO | Source: Ambulatory Visit | Attending: Nurse Practitioner | Admitting: Nurse Practitioner

## 2023-07-25 DIAGNOSIS — N839 Noninflammatory disorder of ovary, fallopian tube and broad ligament, unspecified: Secondary | ICD-10-CM | POA: Diagnosis not present

## 2023-07-25 DIAGNOSIS — R935 Abnormal findings on diagnostic imaging of other abdominal regions, including retroperitoneum: Secondary | ICD-10-CM

## 2023-07-29 ENCOUNTER — Encounter (HOSPITAL_BASED_OUTPATIENT_CLINIC_OR_DEPARTMENT_OTHER): Payer: Medicare PPO | Admitting: Radiology

## 2023-07-29 ENCOUNTER — Encounter: Payer: Medicare PPO | Admitting: Hematology

## 2023-07-29 ENCOUNTER — Other Ambulatory Visit: Payer: Medicare PPO

## 2023-07-29 DIAGNOSIS — Z1231 Encounter for screening mammogram for malignant neoplasm of breast: Secondary | ICD-10-CM

## 2023-07-30 ENCOUNTER — Telehealth: Payer: Self-pay

## 2023-07-30 NOTE — Patient Outreach (Signed)
Attempted to contact patient regarding DM eye, urine micro . Left voicemail for patient to return my call at [phone].  Nicholes Rough, CMA Care Guide [employer]

## 2023-08-06 ENCOUNTER — Inpatient Hospital Stay: Payer: Medicare PPO | Attending: Hematology | Admitting: Hematology

## 2023-08-06 ENCOUNTER — Inpatient Hospital Stay: Payer: Medicare PPO

## 2023-08-06 VITALS — BP 124/76 | HR 78 | Temp 97.9°F | Resp 18 | Wt 160.2 lb

## 2023-08-06 DIAGNOSIS — R768 Other specified abnormal immunological findings in serum: Secondary | ICD-10-CM | POA: Diagnosis not present

## 2023-08-06 DIAGNOSIS — D72829 Elevated white blood cell count, unspecified: Secondary | ICD-10-CM

## 2023-08-06 DIAGNOSIS — D892 Hypergammaglobulinemia, unspecified: Secondary | ICD-10-CM

## 2023-08-06 DIAGNOSIS — D472 Monoclonal gammopathy: Secondary | ICD-10-CM

## 2023-08-06 DIAGNOSIS — R630 Anorexia: Secondary | ICD-10-CM | POA: Insufficient documentation

## 2023-08-06 DIAGNOSIS — K921 Melena: Secondary | ICD-10-CM | POA: Insufficient documentation

## 2023-08-06 DIAGNOSIS — D509 Iron deficiency anemia, unspecified: Secondary | ICD-10-CM

## 2023-08-06 DIAGNOSIS — F17210 Nicotine dependence, cigarettes, uncomplicated: Secondary | ICD-10-CM | POA: Insufficient documentation

## 2023-08-06 LAB — CBC WITH DIFFERENTIAL (CANCER CENTER ONLY)
Abs Immature Granulocytes: 0.04 10*3/uL (ref 0.00–0.07)
Basophils Absolute: 0.1 10*3/uL (ref 0.0–0.1)
Basophils Relative: 1 %
Eosinophils Absolute: 0.2 10*3/uL (ref 0.0–0.5)
Eosinophils Relative: 3 %
HCT: 30.9 % — ABNORMAL LOW (ref 36.0–46.0)
Hemoglobin: 9.7 g/dL — ABNORMAL LOW (ref 12.0–15.0)
Immature Granulocytes: 0 %
Lymphocytes Relative: 19 %
Lymphs Abs: 1.7 10*3/uL (ref 0.7–4.0)
MCH: 24.7 pg — ABNORMAL LOW (ref 26.0–34.0)
MCHC: 31.4 g/dL (ref 30.0–36.0)
MCV: 78.6 fL — ABNORMAL LOW (ref 80.0–100.0)
Monocytes Absolute: 0.7 10*3/uL (ref 0.1–1.0)
Monocytes Relative: 8 %
Neutro Abs: 6.2 10*3/uL (ref 1.7–7.7)
Neutrophils Relative %: 69 %
Platelet Count: 309 10*3/uL (ref 150–400)
RBC: 3.93 MIL/uL (ref 3.87–5.11)
RDW: 20.5 % — ABNORMAL HIGH (ref 11.5–15.5)
WBC Count: 8.9 10*3/uL (ref 4.0–10.5)
nRBC: 0 % (ref 0.0–0.2)

## 2023-08-06 LAB — CMP (CANCER CENTER ONLY)
ALT: 14 U/L (ref 0–44)
AST: 13 U/L — ABNORMAL LOW (ref 15–41)
Albumin: 3.8 g/dL (ref 3.5–5.0)
Alkaline Phosphatase: 64 U/L (ref 38–126)
Anion gap: 3 — ABNORMAL LOW (ref 5–15)
BUN: 24 mg/dL — ABNORMAL HIGH (ref 8–23)
CO2: 29 mmol/L (ref 22–32)
Calcium: 9 mg/dL (ref 8.9–10.3)
Chloride: 108 mmol/L (ref 98–111)
Creatinine: 1 mg/dL (ref 0.44–1.00)
GFR, Estimated: >60 mL/min (ref >60)
Glucose, Bld: 100 mg/dL — ABNORMAL HIGH (ref 70–99)
Potassium: 4.2 mmol/L (ref 3.5–5.1)
Sodium: 140 mmol/L (ref 135–145)
Total Bilirubin: 0.2 mg/dL (ref <1.2)
Total Protein: 7 g/dL (ref 6.5–8.1)

## 2023-08-06 LAB — IRON AND IRON BINDING CAPACITY (CC-WL,HP ONLY)
Iron: 46 ug/dL (ref 28–170)
Saturation Ratios: 14 % (ref 10.4–31.8)
TIBC: 326 ug/dL (ref 250–450)
UIBC: 280 ug/dL (ref 148–442)

## 2023-08-06 LAB — VITAMIN B12: Vitamin B-12: 210 pg/mL (ref 180–914)

## 2023-08-06 LAB — FERRITIN: Ferritin: 24 ng/mL (ref 11–307)
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
## 2023-08-07 LAB — KAPPA/LAMBDA LIGHT CHAINS
Kappa free light chain: 47.5 mg/L — ABNORMAL HIGH (ref 3.3–19.4)
Kappa, lambda light chain ratio: 1.64 (ref 0.26–1.65)
Lambda free light chains: 28.9 mg/L — ABNORMAL HIGH (ref 5.7–26.3)

## 2023-08-11 LAB — MULTIPLE MYELOMA PANEL, SERUM
Albumin SerPl Elph-Mcnc: 3.5 g/dL (ref 2.9–4.4)
Albumin/Glob SerPl: 1.2 (ref 0.7–1.7)
Alpha 1: 0.3 g/dL (ref 0.0–0.4)
Alpha2 Glob SerPl Elph-Mcnc: 0.7 g/dL (ref 0.4–1.0)
B-Globulin SerPl Elph-Mcnc: 1 g/dL (ref 0.7–1.3)
Gamma Glob SerPl Elph-Mcnc: 1.1 g/dL (ref 0.4–1.8)
Globulin, Total: 3.1 g/dL (ref 2.2–3.9)
IgA: 297 mg/dL (ref 87–352)
IgG (Immunoglobin G), Serum: 1168 mg/dL (ref 586–1602)
IgM (Immunoglobulin M), Srm: 63 mg/dL (ref 26–217)
Total Protein ELP: 6.6 g/dL (ref 6.0–8.5)

## 2023-08-12 ENCOUNTER — Other Ambulatory Visit: Payer: Self-pay | Admitting: Nurse Practitioner

## 2023-08-12 DIAGNOSIS — F3341 Major depressive disorder, recurrent, in partial remission: Secondary | ICD-10-CM

## 2023-08-12 DIAGNOSIS — Z720 Tobacco use: Secondary | ICD-10-CM

## 2023-08-14 ENCOUNTER — Encounter (INDEPENDENT_AMBULATORY_CARE_PROVIDER_SITE_OTHER): Payer: Medicare PPO | Admitting: Ophthalmology

## 2023-08-20 ENCOUNTER — Inpatient Hospital Stay: Payer: Medicare PPO | Attending: Hematology | Admitting: Hematology

## 2023-08-20 ENCOUNTER — Encounter (INDEPENDENT_AMBULATORY_CARE_PROVIDER_SITE_OTHER): Payer: Medicare PPO | Admitting: Ophthalmology

## 2023-08-20 DIAGNOSIS — R768 Other specified abnormal immunological findings in serum: Secondary | ICD-10-CM | POA: Insufficient documentation

## 2023-08-20 DIAGNOSIS — F32A Depression, unspecified: Secondary | ICD-10-CM | POA: Insufficient documentation

## 2023-08-20 DIAGNOSIS — K921 Melena: Secondary | ICD-10-CM | POA: Diagnosis not present

## 2023-08-20 DIAGNOSIS — H35033 Hypertensive retinopathy, bilateral: Secondary | ICD-10-CM | POA: Diagnosis not present

## 2023-08-20 DIAGNOSIS — R630 Anorexia: Secondary | ICD-10-CM | POA: Insufficient documentation

## 2023-08-20 DIAGNOSIS — Z7984 Long term (current) use of oral hypoglycemic drugs: Secondary | ICD-10-CM

## 2023-08-20 DIAGNOSIS — E113512 Type 2 diabetes mellitus with proliferative diabetic retinopathy with macular edema, left eye: Secondary | ICD-10-CM

## 2023-08-20 DIAGNOSIS — D472 Monoclonal gammopathy: Secondary | ICD-10-CM

## 2023-08-20 DIAGNOSIS — I10 Essential (primary) hypertension: Secondary | ICD-10-CM | POA: Diagnosis not present

## 2023-08-20 DIAGNOSIS — E538 Deficiency of other specified B group vitamins: Secondary | ICD-10-CM | POA: Insufficient documentation

## 2023-08-20 DIAGNOSIS — H43813 Vitreous degeneration, bilateral: Secondary | ICD-10-CM

## 2023-08-20 DIAGNOSIS — D509 Iron deficiency anemia, unspecified: Secondary | ICD-10-CM

## 2023-08-20 DIAGNOSIS — E113311 Type 2 diabetes mellitus with moderate nonproliferative diabetic retinopathy with macular edema, right eye: Secondary | ICD-10-CM

## 2023-08-20 DIAGNOSIS — H2513 Age-related nuclear cataract, bilateral: Secondary | ICD-10-CM

## 2023-08-20 DIAGNOSIS — D649 Anemia, unspecified: Secondary | ICD-10-CM | POA: Insufficient documentation

## 2023-08-20 DIAGNOSIS — H34812 Central retinal vein occlusion, left eye, with macular edema: Secondary | ICD-10-CM | POA: Diagnosis not present

## 2023-08-20 DIAGNOSIS — F17210 Nicotine dependence, cigarettes, uncomplicated: Secondary | ICD-10-CM | POA: Insufficient documentation
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
## 2023-09-02 ENCOUNTER — Encounter: Payer: Self-pay | Admitting: Internal Medicine

## 2023-09-02 ENCOUNTER — Ambulatory Visit: Payer: Medicare PPO | Admitting: Internal Medicine

## 2023-09-02 VITALS — BP 116/64 | HR 48 | Temp 98.0°F | Resp 13 | Ht 65.0 in | Wt 137.0 lb

## 2023-09-02 DIAGNOSIS — K209 Esophagitis, unspecified without bleeding: Secondary | ICD-10-CM | POA: Diagnosis not present

## 2023-09-02 DIAGNOSIS — I10 Essential (primary) hypertension: Secondary | ICD-10-CM | POA: Diagnosis not present

## 2023-09-02 DIAGNOSIS — K222 Esophageal obstruction: Secondary | ICD-10-CM | POA: Diagnosis not present

## 2023-09-02 DIAGNOSIS — K649 Unspecified hemorrhoids: Secondary | ICD-10-CM

## 2023-09-02 DIAGNOSIS — Z8719 Personal history of other diseases of the digestive system: Secondary | ICD-10-CM | POA: Diagnosis not present

## 2023-09-02 DIAGNOSIS — Z8619 Personal history of other infectious and parasitic diseases: Secondary | ICD-10-CM

## 2023-09-02 DIAGNOSIS — K2950 Unspecified chronic gastritis without bleeding: Secondary | ICD-10-CM | POA: Diagnosis not present

## 2023-09-02 DIAGNOSIS — E119 Type 2 diabetes mellitus without complications: Secondary | ICD-10-CM | POA: Diagnosis not present

## 2023-09-02 MED ORDER — HYDROCORTISONE (PERIANAL) 2.5 % EX CREA: 1.0000 | TOPICAL_CREAM | Freq: Two times a day (BID) | CUTANEOUS | 0 refills | Status: AC

## 2023-09-02 MED ORDER — OMEPRAZOLE 40 MG PO CPDR: DELAYED_RELEASE_CAPSULE | ORAL | 3 refills | Status: DC

## 2023-09-02 MED ORDER — SODIUM CHLORIDE 0.9 % IV SOLN: 500.0000 mL | Freq: Once | INTRAVENOUS | Status: DC

## 2023-09-02 NOTE — Patient Instructions (Addendum)
Await pathology results. Use Prilosec (omeprazole) 40 mg PO twice daily (30 minutes before breakfast and dinner) for 8  weeks, then daily Return to GI clinic in 2-3 months.  Follow dilation diet!!!  YOU HAD AN ENDOSCOPIC PROCEDURE TODAY AT THE [facility]:   Refer to the procedure report that was given to you for any specific questions about what was found during the examination.  If the procedure report does not answer your questions, please call your gastroenterologist to clarify.  If you requested that your care partner not be given the details of your procedure findings, then the procedure report has been included in a sealed envelope for you to review at your convenience later.  YOU SHOULD EXPECT: Some feelings of bloating in the abdomen. Passage of more gas than usual.  Walking can help get rid of the air that was put into your GI tract during the procedure and reduce the bloating.   Please Note:  You might notice some irritation and congestion in your nose or some drainage.  This is from the oxygen used during your procedure.  There is no need for concern and it should clear up in a day or so.  SYMPTOMS TO REPORT IMMEDIATELY: Following upper endoscopy (EGD)  Vomiting of blood or coffee ground material  New chest pain or pain under the shoulder blades  Painful or persistently difficult swallowing  New shortness of breath  Fever of 100F or higher  Black, tarry-looking stools  For urgent or emergent issues, a gastroenterologist can be reached at any hour by calling [phone]. Do not use MyChart messaging for urgent concerns.    DIET:  Dilation diet today!!!  Drink plenty of fluids but you should avoid alcoholic beverages for 24 hours.  ACTIVITY:  You should plan to take it easy for the rest of today and you should NOT DRIVE or use heavy machinery until tomorrow (because of the sedation medicines used during the test).    FOLLOW UP: Our staff will call the number  listed on your records the next business day following your procedure.  We will call around 7:15- 8:00 am to check on you and address any questions or concerns that you may have regarding the information given to you following your procedure. If we do not reach you, we will leave a message.     If any biopsies were taken you will be contacted by phone or by letter within the next 1-3 weeks.  Please call us at [phone] if you have not heard about the biopsies in 3 weeks.    SIGNATURES/CONFIDENTIALITY: You and/or your care partner have signed paperwork which will be entered into your electronic medical record.  These signatures attest to the fact that that the information above on your After Visit Summary has been reviewed and is understood.  Full responsibility of the confidentiality of this discharge information lies with you and/or your care-partner.

## 2023-09-02 NOTE — Op Note (Signed)
[facility] Patient Name: [name] Procedure Date: 09/02/2023 9:19 AM MRN: [id number] Endoscopist: Madelyn Brunner Crocker , , [id number] Age: 67 Referring MD:  Date of Birth: 1956-02-17 Gender: Female Account #: [id number] Procedure:                Upper GI endoscopy Indications:              Follow-up of Helicobacter pylori, Follow-up of                            esophagitis, Follow-up of esophageal stenosis Medicines:                Monitored Anesthesia Care Procedure:                Pre-Anesthesia Assessment:                           - Prior to the procedure, a History and Physical                            was performed, and patient medications and                            allergies were reviewed. The patient's tolerance of                            previous anesthesia was also reviewed. The risks                            and benefits of the procedure and the sedation                            options and risks were discussed with the patient.                            All questions were answered, and informed consent                            was obtained. Prior Anticoagulants: The patient has                            taken no anticoagulant or antiplatelet agents. ASA                            Grade Assessment: III - A patient with severe                            systemic disease. After reviewing the risks and                            benefits, the patient was deemed in satisfactory                            condition to undergo the procedure.  After obtaining informed consent, the endoscope was                            passed under direct vision. Throughout the                            procedure, the patient's blood pressure, pulse, and                            oxygen saturations were monitored continuously. The                            GIF [id number] #[id number] was introduced through the                             mouth, and advanced to the second part of duodenum.                            The upper GI endoscopy was accomplished without                            difficulty. The patient tolerated the procedure                            well. Scope In: Scope Out: Findings:                 One benign-appearing, intrinsic moderate                            (circumferential scarring or stenosis; an endoscope                            may pass) stenosis was found in the proximal                            esophagus. This stenosis measured 5 cm (in length).                            The stenosis was traversed. A guidewire was placed                            and the scope was withdrawn. Dilation was performed                            with a Savary dilator with mild resistance at 14                            mm. The dilation site was examined following                            endoscope reinsertion and showed mild mucosal  disruption.                           Localized inflammation characterized by congestion                            (edema), erosions and erythema was found in the                            gastric antrum. Biopsies were taken with a cold                            forceps for histology.                           The examined duodenum was normal. Complications:            No immediate complications. Estimated Blood Loss:     Estimated blood loss was minimal. Impression:               - Benign-appearing esophageal stenosis. Dilated.                           - Gastritis. Biopsied.                           - Normal examined duodenum. Recommendation:           - Discharge patient to home (with escort).                           - Await pathology results.                           - Use Prilosec (omeprazole) 40 mg PO BID for 8                            weeks, then QD.                           - Return to GI clinic in 2-3 months.                            - The findings and recommendations were discussed                            with the patient. Dr Particia Lather "Alan Ripper" Leonides Schanz,  09/02/2023 9:47:56 AM
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
## 2023-09-02 NOTE — Progress Notes (Signed)
Called to room to assist during endoscopic procedure.  Patient ID and intended procedure confirmed with present staff. Received instructions for my participation in the procedure from the performing physician.  

## 2023-09-02 NOTE — Progress Notes (Signed)
Vss nad trans to pacu 

## 2023-09-03 ENCOUNTER — Telehealth: Payer: Self-pay

## 2023-09-03 NOTE — Telephone Encounter (Signed)
  Follow up Call-     09/02/2023    8:27 AM 07/01/2023    2:54 PM  Call back number  Post procedure Call Back phone  # [phone]  Permission to leave phone message Yes Yes     Patient questions:  Do you have a fever, pain , or abdominal swelling? No. Pain Score  0 *  Have you tolerated food without any problems? Yes.    Have you been able to return to your normal activities? Yes.    Do you have any questions about your discharge instructions: Diet   No. Medications  No. Follow up visit  No.  Do you have questions or concerns about your Care? No.  Actions: * If pain score is 4 or above: No action needed, pain <4.

## 2023-09-09 LAB — SURGICAL PATHOLOGY

## 2023-09-10 ENCOUNTER — Encounter: Payer: Self-pay | Admitting: Internal Medicine

## 2023-09-17 ENCOUNTER — Encounter (INDEPENDENT_AMBULATORY_CARE_PROVIDER_SITE_OTHER): Payer: Medicare PPO | Admitting: Ophthalmology

## 2023-09-24 ENCOUNTER — Encounter (INDEPENDENT_AMBULATORY_CARE_PROVIDER_SITE_OTHER): Payer: Medicare PPO | Admitting: Ophthalmology

## 2023-09-26 ENCOUNTER — Other Ambulatory Visit: Payer: Self-pay | Admitting: Nurse Practitioner

## 2023-09-26 ENCOUNTER — Encounter (INDEPENDENT_AMBULATORY_CARE_PROVIDER_SITE_OTHER): Payer: Medicare PPO | Admitting: Ophthalmology

## 2023-09-26 DIAGNOSIS — I10 Essential (primary) hypertension: Secondary | ICD-10-CM

## 2023-10-02 ENCOUNTER — Telehealth: Payer: Medicare PPO | Admitting: Nurse Practitioner

## 2023-10-02 ENCOUNTER — Ambulatory Visit: Payer: Medicare PPO

## 2023-10-02 ENCOUNTER — Ambulatory Visit: Payer: Medicare PPO | Admitting: Nurse Practitioner

## 2023-10-02 ENCOUNTER — Encounter: Payer: Self-pay | Admitting: Nurse Practitioner

## 2023-10-02 DIAGNOSIS — Z0000 Encounter for general adult medical examination without abnormal findings: Secondary | ICD-10-CM

## 2023-10-02 DIAGNOSIS — I10 Essential (primary) hypertension: Secondary | ICD-10-CM | POA: Diagnosis not present

## 2023-10-02 DIAGNOSIS — E559 Vitamin D deficiency, unspecified: Secondary | ICD-10-CM

## 2023-10-02 DIAGNOSIS — I700 Atherosclerosis of aorta: Secondary | ICD-10-CM | POA: Diagnosis not present

## 2023-10-02 DIAGNOSIS — E1159 Type 2 diabetes mellitus with other circulatory complications: Secondary | ICD-10-CM

## 2023-10-02 DIAGNOSIS — H35033 Hypertensive retinopathy, bilateral: Secondary | ICD-10-CM | POA: Diagnosis not present

## 2023-10-02 DIAGNOSIS — F17200 Nicotine dependence, unspecified, uncomplicated: Secondary | ICD-10-CM

## 2023-10-02 MED ORDER — EMPAGLIFLOZIN 10 MG PO TABS: 10.0000 mg | ORAL_TABLET | Freq: Every day | ORAL | 1 refills | Status: DC

## 2023-10-02 NOTE — Assessment & Plan Note (Signed)
HgbA1c has been stable, she is not wanting to stop her metformin due to GI symptoms. I will send a Rx for Jardiance and discussed holding the medication if she is feeling sick. Once we see what her A1c is we can consider stopping her metformin

## 2023-10-02 NOTE — Assessment & Plan Note (Signed)
Will check vitamin D level and supplement as needed.    Also encouraged to spend 15 minutes in the sun daily.   

## 2023-10-02 NOTE — Patient Instructions (Signed)
Ms. [name] , Thank you for taking time to come for your Medicare Wellness Visit. I appreciate your ongoing commitment to your health goals. Please review the following plan we discussed and let me know if I can assist you in the future.   Referrals/Orders/Follow-Ups/Clinician Recommendations: none  This is a list of the screening recommended for you and due dates:  Health Maintenance  Topic Date Due   Zoster (Shingles) Vaccine (1 of 2) Never done   Eye exam for diabetics  02/09/2021   Complete foot exam   01/02/2023   COVID-19 Vaccine (4 - 2024-25 season) 05/12/2023   Yearly kidney health urinalysis for diabetes  08/17/2023   Flu Shot  12/09/2023*   Hemoglobin A1C  11/18/2023   Yearly kidney function blood test for diabetes  08/05/2024   Mammogram  08/27/2024   Medicare Annual Wellness Visit  10/01/2024   Colon Cancer Screening  06/30/2026   DTaP/Tdap/Td vaccine (2 - Td or Tdap) 09/07/2031   Pneumonia Vaccine  Completed   DEXA scan (bone density measurement)  Completed   Hepatitis C Screening  Completed   HPV Vaccine  Aged Out   Screening for Lung Cancer  Discontinued  *Topic was postponed. The date shown is not the original due date.    Advanced directives: (ACP Link)Information on Advanced Care Planning can be found at [facility] Directives Advance Health Care Directives ([email or web address]   Next Medicare Annual Wellness Visit scheduled for next year: No, office will schedule  insert Preventive Care attachment Insert FALL PREVENTION attachment if needed

## 2023-10-02 NOTE — Assessment & Plan Note (Signed)
Smoking cessation instruction/counseling given:  counseled patient on the dangers of tobacco use, advised patient to stop smoking, and reviewed strategies to maximize success 

## 2023-10-02 NOTE — Assessment & Plan Note (Signed)
Continue f/u with Opthalmology

## 2023-10-02 NOTE — Assessment & Plan Note (Signed)
Continue current medications. 

## 2023-10-02 NOTE — Progress Notes (Signed)
Virtual Visit via Video Note  Madelaine Bhat, CMA,acting as a scribe for Arnette Felts, FNP.,have documented all relevant documentation on the behalf of Arnette Felts, FNP,as directed by  Arnette Felts, FNP while in the presence of Arnette Felts, FNP.  I connected with [name] on 10/02/23 at 12:00 PM EST by a video enabled telemedicine application and verified that I am speaking with the correct person using two identifiers.  Patient Location: Home Provider Location: Office/Clinic  I discussed the limitations, risks, security, and privacy concerns of performing an evaluation and management service by video and the availability of in person appointments. I also discussed with the patient that there may be a patient responsible charge related to this service. The patient expressed understanding and agreed to proceed.  Subjective: PCP: Arnette Felts, FNP  No chief complaint on file.  Patient presents today for a bp and dm follow up, Patient reports compliance with medication. Patient denies any chest pain, SOB, or headaches. Patient has no concerns today.  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 She is taking vitamin B12 supplement after going to endocrinology and was to take vitamin B complex. She has an appt with Nephrology. She continues to have GI upset with the metformin.      Review of Systems  Constitutional: Negative.   Respiratory: Negative.    Cardiovascular: Negative.   Gastrointestinal:        GI upset with taking metformin  Musculoskeletal: Negative.   Neurological: Negative.   Psychiatric/Behavioral: Negative.       Current Outpatient Medications:    amLODipine (NORVASC) 5 MG tablet, TAKE 1 TABLET (5 MG TOTAL) BY MOUTH DAILY., Disp: 90 tablet, Rfl: 1   aspirin EC 81 MG tablet, Take 81 mg by mouth daily. Swallow whole., Disp: , Rfl:    atorvastatin (LIPITOR) 10 MG tablet, TAKE 1 TABLET (10 MG TOTAL) BY MOUTH DAILY., Disp: 30  tablet, Rfl: 11   buPROPion (WELLBUTRIN XL) 150 MG 24 hr tablet, TAKE 1 TABLET (150 MG TOTAL) BY MOUTH EVERY MORNING., Disp: 30 tablet, Rfl: 2   Cyanocobalamin (VITAMIN B12 PO), Take 1 tablet by mouth daily. 5,000 mcg, Disp: , Rfl:    dorzolamide-timolol (COSOPT) 22.3-6.8 MG/ML ophthalmic solution, Place 1 drop into both eyes 2 (two) times daily., Disp: , Rfl:    empagliflozin (JARDIANCE) 10 MG TABS tablet, Take 1 tablet (10 mg total) by mouth daily before breakfast., Disp: 90 tablet, Rfl: 1   ferrous sulfate 325 (65 FE) MG tablet, Take 1 tablet (325 mg total) by mouth 2 (two) times daily with a meal., Disp: 60 tablet, Rfl: 3   glucose blood (ONETOUCH ULTRA) test strip, USE TO CHECK BLOOD SUGAR ONCE A DAY AS INSTRUCTED, Disp: 100 each, Rfl: 2   Lancets (ONETOUCH DELICA PLUS LANCET33G) MISC, USE TO CHECK BLOOD SUGAR ONCE A DAY AS INSTRUCTED, Disp: 100 each, Rfl: 2   metFORMIN (GLUCOPHAGE) 500 MG tablet, TAKE 1 TABLET (500 MG TOTAL) BY MOUTH DAILY WITH BREAKFAST., Disp: 90 tablet, Rfl: 1   Multiple Vitamins-Minerals (CENTRUM ADULT PO), Take by mouth., Disp: , Rfl:    omeprazole (PRILOSEC) 40 MG capsule, 1 tablet BID for 8 weeks then QD, Disp: 60 capsule, Rfl: 3   valsartan (DIOVAN) 160 MG tablet, TAKE 1 TABLET (160 MG TOTAL) BY MOUTH DAILY., Disp: 90 tablet, Rfl: 1   Vitamin D, Ergocalciferol, (DRISDOL) 1.25 MG (50000 UNIT) CAPS capsule, TAKE 1 CAPSULE (50,000 UNITS TOTAL) BY MOUTH EVERY  7 (SEVEN) DAYS., Disp: 12 capsule, Rfl: 0   alendronate (FOSAMAX) 70 MG tablet, Take 1 tablet (70 mg total) by mouth every 7 (seven) days. Take with a full glass of water on an empty stomach. (Patient not taking: Reported on 09/02/2023), Disp: 4 tablet, Rfl: 3   escitalopram (LEXAPRO) 20 MG tablet, TAKE 1 TABLET (20 MG TOTAL) BY MOUTH DAILY. (Patient not taking: Reported on 10/02/2023), Disp: 30 tablet, Rfl: 3   fluconazole (DIFLUCAN) 200 MG tablet, Take 400 mg x 1 dose, then take 200 mg daily x 13 days (Patient not  taking: Reported on 10/02/2023), Disp: 15 tablet, Rfl: 0  Observations/Objective: There were no vitals filed for this visit. Physical Exam Vitals reviewed.  Constitutional:      General: She is not in acute distress.    Appearance: Normal appearance.  Cardiovascular:     Rate and Rhythm: Normal rate and regular rhythm.  Pulmonary:     Effort: Pulmonary effort is normal. No respiratory distress.  Neurological:     General: No focal deficit present.     Mental Status: She is alert and oriented to person, place, and time.     Cranial Nerves: No cranial nerve deficit.     Motor: No weakness.  Psychiatric:        Mood and Affect: Mood normal.        Behavior: Behavior normal.        Thought Content: Thought content normal.        Judgment: Judgment normal.     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 I will send a Rx for Jardiance and discussed holding the medication if she is feeling sick. Once we see what her A1c is we can consider stopping her metformin  Orders: -     Hemoglobin A1c; Future -     Empagliflozin; Take 1 tablet (10 mg total) by mouth daily before breakfast.  Dispense: 90 tablet; Refill: 1  Essential hypertension Assessment & Plan: Continue current medications   Hypertensive retinopathy of both eyes Assessment & Plan: Continue f/u with Opthalmology   Vitamin D deficiency Assessment & Plan: Will check vitamin D level and supplement as needed.    Also encouraged to spend 15 minutes in the sun daily.     Tobacco dependence Assessment & Plan: Smoking cessation instruction/counseling given:  counseled patient on the dangers of tobacco use, advised patient to stop smoking, and reviewed strategies to maximize success      Follow Up Instructions: Return for controlled DM check 4 months; schedule lab visit on  1/29 for HgbA1c check.   I discussed the assessment and treatment plan with the patient. The patient was provided an opportunity to ask questions, and all were answered. The patient agreed with the plan and demonstrated an understanding of the instructions.   The patient was advised to call back or seek an in-person evaluation if the symptoms worsen or if the condition fails to improve as anticipated.  The above assessment and management plan was discussed with the patient. The patient verbalized understanding of and has agreed to the management plan.   Jeanell Sparrow, FNP, have reviewed all documentation for this visit. The documentation on 10/02/23 for the exam, diagnosis, procedures, and orders are all accurate and complete.

## 2023-10-02 NOTE — Progress Notes (Signed)
Subjective:   [name] is a 68 y.o. female who presents for Medicare Annual (Subsequent) preventive examination.  Visit Complete: Virtual I connected with  DOIS GENTRY on 10/02/23 by a audio enabled telemedicine application and verified that I am speaking with the correct person using two identifiers.  Interactive audio and video telecommunications were attempted between this provider and patient, however failed, due to patient having technical difficulties OR patient did not have access to video capability.  We continued and completed visit with audio only.  Patient Location: Home  Provider Location: Office/Clinic  I discussed the limitations of evaluation and management by telemedicine. The patient expressed understanding and agreed to proceed.  Vital Signs: Because this visit was a virtual/telehealth visit, some criteria may be missing or patient reported. Any vitals not documented were not able to be obtained and vitals that have been documented are patient reported.    Cardiac Risk Factors include: advanced age (>55men, >16 women);diabetes mellitus;hypertension     Objective:    Today's Vitals   10/02/23 1116  PainSc: 7    There is no height or weight on file to calculate BMI.     10/02/2023   11:37 AM 09/12/2022   11:52 AM 09/06/2021   10:12 AM 01/06/2020    3:42 PM 11/15/2018    2:50 PM 06/10/2017    3:43 PM 05/10/2017    3:16 PM  Advanced Directives  Does Patient Have a Medical Advance Directive? No Yes No No No No No  Type of Energy manager of Healthcare Power of Attorney in Chart?  No - copy requested       Would patient like information on creating a medical advance directive?   Yes (MAU/Ambulatory/Procedural Areas - Information given) No - Patient declined No - Patient declined      Current Medications (verified) Outpatient Encounter Medications as of 10/02/2023  Medication Sig   amLODipine (NORVASC) 5  MG tablet TAKE 1 TABLET (5 MG TOTAL) BY MOUTH DAILY.   aspirin EC 81 MG tablet Take 81 mg by mouth daily. Swallow whole.   atorvastatin (LIPITOR) 10 MG tablet TAKE 1 TABLET (10 MG TOTAL) BY MOUTH DAILY.   buPROPion (WELLBUTRIN XL) 150 MG 24 hr tablet TAKE 1 TABLET (150 MG TOTAL) BY MOUTH EVERY MORNING.   Cyanocobalamin (VITAMIN B12 PO) Take 1 tablet by mouth daily. 5,000 mcg   dorzolamide-timolol (COSOPT) 22.3-6.8 MG/ML ophthalmic solution Place 1 drop into both eyes 2 (two) times daily.   ferrous sulfate 325 (65 FE) MG tablet Take 1 tablet (325 mg total) by mouth 2 (two) times daily with a meal.   glucose blood (ONETOUCH ULTRA) test strip USE TO CHECK BLOOD SUGAR ONCE A DAY AS INSTRUCTED   Lancets (ONETOUCH DELICA PLUS LANCET33G) MISC USE TO CHECK BLOOD SUGAR ONCE A DAY AS INSTRUCTED   metFORMIN (GLUCOPHAGE) 500 MG tablet TAKE 1 TABLET (500 MG TOTAL) BY MOUTH DAILY WITH BREAKFAST.   Multiple Vitamins-Minerals (CENTRUM ADULT PO) Take by mouth.   omeprazole (PRILOSEC) 40 MG capsule 1 tablet BID for 8 weeks then QD   valsartan (DIOVAN) 160 MG tablet TAKE 1 TABLET (160 MG TOTAL) BY MOUTH DAILY.   Vitamin D, Ergocalciferol, (DRISDOL) 1.25 MG (50000 UNIT) CAPS capsule TAKE 1 CAPSULE (50,000 UNITS TOTAL) BY MOUTH EVERY 7 (SEVEN) DAYS.   alendronate (FOSAMAX) 70 MG tablet Take 1 tablet (70 mg total) by mouth every 7 (seven) days.  Take with a full glass of water on an empty stomach. (Patient not taking: Reported on 10/02/2023)   escitalopram (LEXAPRO) 20 MG tablet TAKE 1 TABLET (20 MG TOTAL) BY MOUTH DAILY. (Patient not taking: Reported on 08/06/2023)   fluconazole (DIFLUCAN) 200 MG tablet Take 400 mg x 1 dose, then take 200 mg daily x 13 days (Patient not taking: Reported on 08/06/2023)   omeprazole (PRILOSEC) 40 MG capsule Take 1 capsule (40 mg total) by mouth in the morning and at bedtime.   No facility-administered encounter medications on file as of 10/02/2023.    Allergies (verified) Patient has  no known allergies.   History: Past Medical History:  Diagnosis Date   Anemia 01/07/2020   REQUIRING TRANSFUSION   Back pain    Diabetes mellitus without complication (HCC)    GERD (gastroesophageal reflux disease)    Hypertension    Olecranon bursitis of left elbow    Past Surgical History:  Procedure Laterality Date   ABDOMINAL HYSTERECTOMY     spared ovaries   OLECRANON BURSECTOMY Left 06/19/2017   Procedure: Excision olecranon bursa;  Surgeon: Jodi Geralds, MD;  Location: [facility];  Service: Orthopedics;  Laterality: Left;  Panel Length: 60 mins    TIBIA IM NAIL INSERTION Right 06/10/2016   Procedure: INTRAMEDULLARY (IM) NAIL TIBIAL RODING;  Surgeon: Jodi Geralds, MD;  Location: [facility];  Service: Orthopedics;  Laterality: Right;   Family History  Problem Relation Age of Onset   Diabetes Mother    Hypertension Mother    Diabetes Father    Hypertension Father    Breast cancer Sister    Breast cancer Sister    Breast cancer Paternal Aunt    Esophageal cancer Neg Hx    Liver disease Neg Hx    Colon cancer Neg Hx    Social History   Socioeconomic History   Marital status: Single    Spouse name: Not on file   Number of children: 0   Years of education: Not on file   Highest education level: Not on file  Occupational History   Occupation: retired  Tobacco Use   Smoking status: Every Day    Current packs/day: 0.50    Average packs/day: 0.5 packs/day for 30.0 years (15.0 ttl pk-yrs)    Types: Cigarettes   Smokeless tobacco: Never   Tobacco comments:    started back in 2012, smoking 1/2 PPD - 05/07/2022  Vaping Use   Vaping status: Never Used  Substance and Sexual Activity   Alcohol use: No   Drug use: Yes    Frequency: 7.0 times per week    Types: Marijuana    Comment: yesterday   Sexual activity: Yes  Other Topics Concern   Not on file  Social History Narrative   Not on file   Social Drivers of Health   Financial Resource Strain: Low Risk   (10/02/2023)   Overall Financial Resource Strain (CARDIA)    Difficulty of Paying Living Expenses: Not hard at all  Food Insecurity: Food Insecurity Present (10/02/2023)   Hunger Vital Sign    Worried About Running Out of Food in the Last Year: Sometimes true    Ran Out of Food in the Last Year: Sometimes true  Transportation Needs: No Transportation Needs (10/02/2023)   PRAPARE - Administrator, Civil Service (Medical): No    Lack of Transportation (Non-Medical): No  Physical Activity: Insufficiently Active (10/02/2023)   Exercise Vital Sign    Days  of Exercise per Week: 4 days    Minutes of Exercise per Session: 10 min  Stress: Stress Concern Present (10/02/2023)   [employer] of Occupational Health - Occupational Stress Questionnaire    Feeling of Stress : To some extent  Social Connections: Moderately Integrated (10/02/2023)   Social Connection and Isolation Panel [NHANES]    Frequency of Communication with Friends and Family: More than three times a week    Frequency of Social Gatherings with Friends and Family: More than three times a week    Attends Religious Services: More than 4 times per year    Active Member of [employer] or Organizations: Yes    Attends Engineer, structural: More than 4 times per year    Marital Status: Divorced    Tobacco Counseling Ready to quit: Yes Counseling given: Not Answered Tobacco comments: started back in 2012, smoking 1/2 PPD - 05/07/2022   Clinical Intake:  Pre-visit preparation completed: Yes  Pain : 0-10 Pain Score: 7  Pain Type: Chronic pain Pain Location: Back Pain Orientation: Lower, Right Pain Descriptors / Indicators: Aching Pain Onset: More than a month ago Pain Frequency: Intermittent     Nutritional Risks: None Diabetes: Yes CBG done?: No Did pt. bring in CBG monitor from home?: No  How often do you need to have someone help you when you read instructions, pamphlets, or other written materials from  your doctor or pharmacy?: 1 - Never  Interpreter Needed?: No  Information entered by :: NAllen LPN   Activities of Daily Living    10/02/2023   11:19 AM  In your present state of health, do you have any difficulty performing the following activities:  Hearing? 0  Vision? 1  Difficulty concentrating or making decisions? 0  Walking or climbing stairs? 1  Dressing or bathing? 0  Doing errands, shopping? 0  Preparing Food and eating ? N  Using the Toilet? N  In the past six months, have you accidently leaked urine? N  Do you have problems with loss of bowel control? N  Managing your Medications? N  Managing your Finances? N  Housekeeping or managing your Housekeeping? N    Patient Care Team: Arnette Felts, FNP as PCP - General (General Practice) Bridgett Larsson, LCSW as Social Worker (Licensed Clinical Social Worker) Sherrie George, MD as Consulting Physician (Ophthalmology)  Indicate any recent Medical Services you may have received from other than [facility] providers in the past year (date may be approximate).     Assessment:   This is a routine wellness examination for [name].  Hearing/Vision screen Hearing Screening - Comments:: Denies hearing issues Vision Screening - Comments:: Regular eye exams, Dr. Ashley Royalty   Goals Addressed             This Visit's Progress    Patient Stated       10/02/2023, stop smoking       Depression Screen    10/02/2023   11:41 AM 05/21/2023    3:58 PM 01/16/2023    3:56 PM 09/12/2022   11:54 AM 08/16/2022   11:04 AM 05/07/2022    3:32 PM 09/06/2021   10:15 AM  PHQ 2/9 Scores  PHQ - 2 Score 1 3 0 0 3 1 2   PHQ- 9 Score  13 3  15 2 5     Fall Risk    10/02/2023   11:40 AM 05/21/2023    3:58 PM 01/16/2023    3:55 PM 01/16/2023  3:53 PM 09/12/2022   11:53 AM  Fall Risk   Falls in the past year? 0 0 1 0 0  Number falls in past yr: 0 0 0 0 0  Injury with Fall? 0 0 1 0 0  Risk for fall due to : Medication side effect;Impaired  mobility;Impaired balance/gait No Fall Risks Impaired balance/gait;Impaired mobility No Fall Risks Impaired balance/gait;Impaired mobility  Follow up Falls prevention discussed;Falls evaluation completed Falls evaluation completed Falls evaluation completed Falls evaluation completed Falls prevention discussed;Education provided;Falls evaluation completed    MEDICARE RISK AT HOME: Medicare Risk at Home Any stairs in or around the home?: Yes If so, are there any without handrails?: No Home free of loose throw rugs in walkways, pet beds, electrical cords, etc?: Yes Adequate lighting in your home to reduce risk of falls?: Yes Life alert?: No Use of a cane, walker or w/c?: Yes Grab bars in the bathroom?: Yes Shower chair or bench in shower?: Yes Elevated toilet seat or a handicapped toilet?: Yes  TIMED UP AND GO:  Was the test performed?  No    Cognitive Function:    09/06/2021    9:32 AM  MMSE - Mini Mental State Exam  Orientation to time 5  Orientation to Place 5  Registration 3  Attention/ Calculation 5  Recall 3  Language- name 2 objects 2  Language- repeat 1  Language- follow 3 step command 3  Language- read & follow direction 1  Write a sentence 1  Copy design 1  Total score 30        10/02/2023   11:41 AM 09/12/2022   12:00 PM 09/06/2021    9:28 AM  6CIT Screen  What Year? 0 points 0 points 0 points  What month? 0 points 0 points 0 points  What time? 0 points 0 points 0 points  Count back from 20 0 points 0 points 0 points  Months in reverse 0 points 0 points 0 points  Repeat phrase 0 points 0 points 4 points  Total Score 0 points 0 points 4 points    Immunizations Immunization History  Administered Date(s) Administered   Fluad Quad(high Dose 65+) 08/21/2022   Influenza, High Dose Seasonal PF 09/06/2021   Influenza,inj,Quad PF,6+ Mos 07/28/2019, 08/11/2020   PFIZER(Purple Top)SARS-COV-2 Vaccination 11/16/2019, 12/11/2019, 07/30/2020   PNEUMOCOCCAL  CONJUGATE-20 01/01/2022   Tdap 09/06/2021    TDAP status: Up to date  Flu Vaccine status: Due, Education has been provided regarding the importance of this vaccine. Advised may receive this vaccine at local pharmacy or Health Dept. Aware to provide a copy of the vaccination record if obtained from local pharmacy or Health Dept. Verbalized acceptance and understanding.  Pneumococcal vaccine status: Up to date  Covid-19 vaccine status: Information provided on how to obtain vaccines.   Qualifies for Shingles Vaccine? Yes   Zostavax completed No   Shingrix Completed?: No.    Education has been provided regarding the importance of this vaccine. Patient has been advised to call insurance company to determine out of pocket expense if they have not yet received this vaccine. Advised may also receive vaccine at local pharmacy or Health Dept. Verbalized acceptance and understanding.  Screening Tests Health Maintenance  Topic Date Due   Zoster Vaccines- Shingrix (1 of 2) Never done   OPHTHALMOLOGY EXAM  02/09/2021   FOOT EXAM  01/02/2023   COVID-19 Vaccine (4 - 2024-25 season) 05/12/2023   Diabetic kidney evaluation - Urine ACR  08/17/2023   INFLUENZA VACCINE  12/09/2023 (Originally 04/11/2023)   HEMOGLOBIN A1C  11/18/2023   Diabetic kidney evaluation - eGFR measurement  08/05/2024   MAMMOGRAM  08/27/2024   Medicare Annual Wellness (AWV)  10/01/2024   Colonoscopy  06/30/2026   DTaP/Tdap/Td (2 - Td or Tdap) 09/07/2031   Pneumonia Vaccine 85+ Years old  Completed   DEXA SCAN  Completed   Hepatitis C Screening  Completed   HPV VACCINES  Aged Out   Lung Cancer Screening  Discontinued    Health Maintenance  Health Maintenance Due  Topic Date Due   Zoster Vaccines- Shingrix (1 of 2) Never done   OPHTHALMOLOGY EXAM  02/09/2021   FOOT EXAM  01/02/2023   COVID-19 Vaccine (4 - 2024-25 season) 05/12/2023   Diabetic kidney evaluation - Urine ACR  08/17/2023    Colorectal cancer screening:  Type of screening: Colonoscopy. Completed 07/01/2023. Repeat every 3 years  Mammogram status: Completed 08/27/2022. Repeat every year  Bone Density status: Completed 02/07/2023.   Lung Cancer Screening: (Low Dose CT Chest recommended if Age 26-80 years, 20 pack-year currently smoking OR have quit w/in 15years.) does not qualify.   Lung Cancer Screening Referral: no  Additional Screening:  Hepatitis C Screening: does qualify; Completed 01/12/2020  Vision Screening: Recommended annual ophthalmology exams for early detection of glaucoma and other disorders of the eye. Is the patient up to date with their annual eye exam?  Yes  Who is the provider or what is the name of the office in which the patient attends annual eye exams? Dr. Ashley Royalty If pt is not established with a provider, would they like to be referred to a provider to establish care? No .   Dental Screening: Recommended annual dental exams for proper oral hygiene  Diabetic Foot Exam: Diabetic Foot Exam: Overdue, Pt has been advised about the importance in completing this exam. Pt is scheduled for diabetic foot exam on next appointment.  Community Resource Referral / Chronic Care Management: CRR required this visit?  No   CCM required this visit?  No     Plan:     I have personally reviewed and noted the following in the patient's chart:   Medical and social history Use of alcohol, tobacco or illicit drugs  Current medications and supplements including opioid prescriptions. Patient is not currently taking opioid prescriptions. Functional ability and status Nutritional status Physical activity Advanced directives List of other physicians Hospitalizations, surgeries, and ER visits in previous 12 months Vitals Screenings to include cognitive, depression, and falls Referrals and appointments  In addition, I have reviewed and discussed with patient certain preventive protocols, quality metrics, and best practice  recommendations. A written personalized care plan for preventive services as well as general preventive health recommendations were provided to patient.     Barb Merino, LPN   6/57/8469   After Visit Summary: (MyChart) Due to this being a telephonic visit, the after visit summary with patients personalized plan was offered to patient via MyChart   Nurse Notes: none

## 2023-10-02 NOTE — Addendum Note (Signed)
Addended by: Barb Merino on: 10/02/2023 11:53 AM   Modules accepted: Orders

## 2023-10-04 ENCOUNTER — Telehealth: Payer: Self-pay | Admitting: *Deleted

## 2023-10-04 DIAGNOSIS — Z720 Tobacco use: Secondary | ICD-10-CM | POA: Diagnosis not present

## 2023-10-04 DIAGNOSIS — E119 Type 2 diabetes mellitus without complications: Secondary | ICD-10-CM | POA: Diagnosis not present

## 2023-10-04 DIAGNOSIS — I129 Hypertensive chronic kidney disease with stage 1 through stage 4 chronic kidney disease, or unspecified chronic kidney disease: Secondary | ICD-10-CM | POA: Diagnosis not present

## 2023-10-04 DIAGNOSIS — N200 Calculus of kidney: Secondary | ICD-10-CM | POA: Diagnosis not present

## 2023-10-04 DIAGNOSIS — R8290 Unspecified abnormal findings in urine: Secondary | ICD-10-CM | POA: Diagnosis not present

## 2023-10-04 DIAGNOSIS — N182 Chronic kidney disease, stage 2 (mild): Secondary | ICD-10-CM | POA: Diagnosis not present

## 2023-10-04 NOTE — Progress Notes (Signed)
Complex Care Management Note Care Guide Note  10/04/2023 Name: [name] MRN: [id number] DOB: Dec 10, 1955   Complex Care Management Outreach Attempts: An unsuccessful telephone outreach was attempted today to offer the patient information about available complex care management services.  Follow Up Plan:  Additional outreach attempts will be made to offer the patient complex care management information and services.   Encounter Outcome:  No Answer [facility] Care Guide  Direct Dial:[phone] [phone] Website: [email or web address]

## 2023-10-05 LAB — LAB REPORT - SCANNED
Albumin, Urine POC: 38.6
Creatinine, POC: 148.8 mg/dL
Microalb Creat Ratio: 26

## 2023-10-07 ENCOUNTER — Encounter (INDEPENDENT_AMBULATORY_CARE_PROVIDER_SITE_OTHER): Payer: Medicare PPO | Admitting: Ophthalmology

## 2023-10-07 ENCOUNTER — Telehealth: Payer: Self-pay | Admitting: *Deleted

## 2023-10-07 DIAGNOSIS — H35033 Hypertensive retinopathy, bilateral: Secondary | ICD-10-CM

## 2023-10-07 DIAGNOSIS — E113312 Type 2 diabetes mellitus with moderate nonproliferative diabetic retinopathy with macular edema, left eye: Secondary | ICD-10-CM | POA: Diagnosis not present

## 2023-10-07 DIAGNOSIS — E113511 Type 2 diabetes mellitus with proliferative diabetic retinopathy with macular edema, right eye: Secondary | ICD-10-CM

## 2023-10-07 DIAGNOSIS — H34812 Central retinal vein occlusion, left eye, with macular edema: Secondary | ICD-10-CM

## 2023-10-07 DIAGNOSIS — H43813 Vitreous degeneration, bilateral: Secondary | ICD-10-CM | POA: Diagnosis not present

## 2023-10-07 DIAGNOSIS — I10 Essential (primary) hypertension: Secondary | ICD-10-CM | POA: Diagnosis not present

## 2023-10-07 DIAGNOSIS — Z7984 Long term (current) use of oral hypoglycemic drugs: Secondary | ICD-10-CM | POA: Diagnosis not present

## 2023-10-07 NOTE — Progress Notes (Signed)
   Complex Care Management Note Care Guide Note  10/07/2023 Name: [name] MRN: [id number] DOB: June 13, 1956   Complex Care Management Outreach Attempts: A second unsuccessful outreach was attempted today to offer the patient with information about available complex care management services.  Follow Up Plan:  Additional outreach attempts will be made to offer the patient complex care management information and services.   Encounter Outcome:  No Answer  [facility] HealthPopulation Health Care Guide  Direct Dial:[phone] Fax:[phone] Website: [email or web address]

## 2023-10-09 ENCOUNTER — Telehealth: Payer: Self-pay | Admitting: *Deleted

## 2023-10-09 ENCOUNTER — Other Ambulatory Visit: Payer: Medicare PPO

## 2023-10-09 DIAGNOSIS — I700 Atherosclerosis of aorta: Secondary | ICD-10-CM | POA: Diagnosis not present

## 2023-10-09 DIAGNOSIS — E1159 Type 2 diabetes mellitus with other circulatory complications: Secondary | ICD-10-CM | POA: Diagnosis not present

## 2023-10-09 NOTE — Progress Notes (Signed)
Complex Care Management Note Care Guide Note  10/09/2023 Name: [name] MRN: [id number] DOB: 06-05-1956   Complex Care Management Outreach Attempts: A third unsuccessful outreach was attempted today to offer the patient with information about available complex care management services.  Follow Up Plan:  No further outreach attempts will be made at this time. We have been unable to contact the patient to offer or enroll patient in complex care management services.  Encounter Outcome:  No Answer  [facility] HealthPopulation Health Care Guide  Direct [phone] Fax:[phone] Website: [email or web address]

## 2023-10-10 ENCOUNTER — Encounter: Payer: Self-pay | Admitting: Nurse Practitioner

## 2023-10-10 LAB — HEMOGLOBIN A1C
Est. average glucose Bld gHb Est-mCnc: 111 mg/dL
Hgb A1c MFr Bld: 5.5 % (ref 4.8–5.6)

## 2023-10-21 ENCOUNTER — Ambulatory Visit: Payer: Self-pay | Admitting: Licensed Clinical Social Worker

## 2023-10-21 NOTE — Patient Instructions (Signed)
 Visit Information  Thank you for taking time to visit with me today. Please don't hesitate to contact me if I can be of assistance to you.   Following are the goals we discussed today:   Goals Addressed             This Visit's Progress    Care Coordination Activities       Care Coordination Interventions: Patient stated that she would like to have more food does not have food stamps and declined to apply she said that she did not want them. The SW educated the patient on Greater Dietitian App (GGFF) for her phone and the patient stated that she knows how to download apps and will get it on her phone. The SW will also mail the food pantry list. 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 Patient stated that sometimes she has problems getting to doctors appointments and her family will assist. Patient does use the [facility] transportation or [place] and does not want to use [employer]. Patient was instructed to ask her doctors offices if they assist with transportation and the patient stated that she will.  Patient wanted to be scheduled with the LCSW and SW scheduled with Alease Hunter with the LCSW's permission.  SW will follow up on 11/05/2023 at 1:30 pm        Our next appointment is by telephone on 11/05/2023 at 1:30 pm  Please call the care guide team at [phone] if you need to cancel or reschedule your appointment.   If you are experiencing a Mental Health or Behavioral Health Crisis or need someone to talk to, please call the Suicide and Crisis Lifeline: 988 go to [facility] [address] [place] [phone]) call 911  Patient verbalizes understanding of instructions and care plan provided today and agrees to view in MyChart. Active MyChart status and patient understanding of how to access instructions and care plan via MyChart confirmed with  patient.     Jonda Neighbours, PhD [facility], [facility] Social Worker Direct Dial: [phone]  Fax: [phone]

## 2023-10-21 NOTE — Patient Outreach (Signed)
  Care Coordination   Initial Visit Note   10/21/2023 Name: [name] MRN: [id number] DOB: 18-Feb-1956  [name] is a 68 y.o. year old female who sees Susanna Epley, FNP for primary care. I spoke with  [name] by phone today.  What matters to the patients health and wellness today?  Food insecurities and  Transportation     Goals Addressed             This Visit's Progress    Care Coordination Activities       Care Coordination Interventions: Patient stated that she would like to have more food does not have food stamps and declined to apply she said that she did not want them. The SW educated the patient on Greater Dietitian App (GGFF) for her phone and the patient stated that she knows how to download apps and will get it on her phone. The SW will also mail the food pantry list. 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 Patient stated that sometimes she has problems getting to doctors appointments and her family will assist. Patient does use the [employer] transportation or [place] and does not want to use [employer]. Patient was instructed to ask her doctors offices if they assist with transportation and the patient stated that she will.  Patient wanted to be scheduled with the LCSW and SW scheduled with [name] with the LCSW's permission.  SW will follow up on 11/05/2023 at 1:30 pm        SDOH assessments and interventions completed:  Yes  SDOH Interventions Today    Flowsheet Row Most Recent Value  SDOH Interventions   Food Insecurity Interventions Community Resources Provided, Other (Comment)  [SW will mail out food pantry list]  Housing Interventions Intervention Not Indicated  Transportation Interventions [facility] ([facility] [employer]), Community Resources Provided  [Pt does not like the [facility]]  Utilities Interventions Intervention  Not Indicated        Care Coordination Interventions:  Yes, provided  Interventions Today    Flowsheet Row Most Recent Value  General Interventions   General Interventions Discussed/Reviewed General Interventions Discussed, [employer]  [SW will mail the food pantry list and educated the patient on the Greater [employer] App (GGFF)]        Follow up plan: Follow up call scheduled for 11/05/2023 at 1:30 pm    Encounter Outcome:  Patient Visit Completed   Jonda Neighbours, PhD [facility], [facility] Social Worker Direct Dial: [phone]  Fax: [phone]

## 2023-10-28 ENCOUNTER — Ambulatory Visit: Payer: Self-pay | Admitting: Licensed Clinical Social Worker

## 2023-10-28 NOTE — Patient Outreach (Signed)
  Care Coordination   Follow Up Visit Note   10/28/2023 Name: [name] MRN: [id number] DOB: 08/17/1956  [name] is a 68 y.o. year old female who sees Arnette Felts, FNP for primary care. I spoke with  [name] by phone today.  What matters to the patients health and wellness today?  Symptom Management    Goals Addressed             This Visit's Progress    Stress Management   On track    Activities and task to complete in order to accomplish goals.   Keep all upcoming appointment discussed today Continue with compliance of taking medication prescribed by Doctor Continue utilizing healthy coping skills to assist with management of stress             SDOH assessments and interventions completed:  No     Care Coordination Interventions:  Yes, provided  Interventions Today    Flowsheet Row Most Recent Value  Chronic Disease   Chronic disease during today's visit Diabetes, Other  [MDD]  General Interventions   General Interventions Discussed/Reviewed General Interventions Reviewed, Doctor Visits  Doctor Visits Discussed/Reviewed Doctor Visits Reviewed  Mental Health Interventions   Mental Health Discussed/Reviewed Mental Health Reviewed, Coping Strategies, Depression  Nutrition Interventions   Nutrition Discussed/Reviewed Nutrition Reviewed  Pharmacy Interventions   Pharmacy Dicussed/Reviewed Pharmacy Topics Reviewed, Medication Adherence  Safety Interventions   Safety Discussed/Reviewed Safety Reviewed       Follow up plan: Follow up call scheduled for 4 weeks    Encounter Outcome:  Patient Visit Completed   Jenel Lucks, LCSW [place]  [facility], [facility] Clinical Social Worker Direct Dial: [phone]  Fax: [phone] Website: [email or web address] 11:24 AM

## 2023-10-28 NOTE — Patient Instructions (Signed)
Visit Information  Thank you for taking time to visit with me today. Please don't hesitate to contact me if I can be of assistance to you.   Following are the goals we discussed today:   Goals Addressed             This Visit's Progress    Stress Management   On track    Activities and task to complete in order to accomplish goals.   Keep all upcoming appointment discussed today Continue with compliance of taking medication prescribed by Doctor Continue utilizing healthy coping skills to assist with management of stress             Our next appointment is by telephone on 3/18 at 10 AM  Please call the care guide team at [phone] if you need to cancel or reschedule your appointment.   If you are experiencing a Mental Health or Behavioral Health Crisis or need someone to talk to, please call the Suicide and Crisis Lifeline: 988 call 911   Patient verbalizes understanding of instructions and care plan provided today and agrees to view in MyChart. Active MyChart status and patient understanding of how to access instructions and care plan via MyChart confirmed with patient.     Windy Fast [facility]  [facility], [facility] Clinical Social Worker Direct Dial: [phone]  Fax: [phone] Website: [email or web address] 11:25 AM

## 2023-11-04 ENCOUNTER — Encounter (INDEPENDENT_AMBULATORY_CARE_PROVIDER_SITE_OTHER): Payer: Medicare PPO | Admitting: Ophthalmology

## 2023-11-04 DIAGNOSIS — H34812 Central retinal vein occlusion, left eye, with macular edema: Secondary | ICD-10-CM

## 2023-11-04 DIAGNOSIS — Z7984 Long term (current) use of oral hypoglycemic drugs: Secondary | ICD-10-CM

## 2023-11-04 DIAGNOSIS — H35033 Hypertensive retinopathy, bilateral: Secondary | ICD-10-CM

## 2023-11-04 DIAGNOSIS — I10 Essential (primary) hypertension: Secondary | ICD-10-CM | POA: Diagnosis not present

## 2023-11-04 DIAGNOSIS — H43813 Vitreous degeneration, bilateral: Secondary | ICD-10-CM

## 2023-11-04 DIAGNOSIS — E113512 Type 2 diabetes mellitus with proliferative diabetic retinopathy with macular edema, left eye: Secondary | ICD-10-CM

## 2023-11-04 DIAGNOSIS — E113311 Type 2 diabetes mellitus with moderate nonproliferative diabetic retinopathy with macular edema, right eye: Secondary | ICD-10-CM

## 2023-11-05 ENCOUNTER — Ambulatory Visit: Payer: Self-pay | Admitting: Licensed Clinical Social Worker

## 2023-11-05 NOTE — Patient Outreach (Signed)
 Care Coordination   Follow Up Visit Note   11/05/2023 Name: Kristin [name] MRN: [id number] DOB: 1956-03-11  Kristin [name] is a 68 y.o. year old female who sees Kristin Felts, FNP for primary care. I spoke with  Kristin [name] by phone today.  What matters to the patients health and wellness today?  Food Insecurities     Goals Addressed             This Visit's Progress    Care Coordination Activities   On track    Care Coordination Interventions: Patient stated that she would like to have more food does not have food stamps and declined to apply she said that she did not want them. The SW educated the patient on Greater Dietitian App (GGFF) for her phone and the patient stated that she knows how to download apps and [name]. The SW [name]. 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 Patient stated that sometimes she has problems getting to doctors appointments and her family [name] assist. Patient does use the [employer] transportation or [place] and does not want to use [employer]. Patient was instructed to ask her doctors offices if they assist with transportation and the patient stated that she [name].  Patient wanted to be scheduled with the LCSW and SW scheduled with Kristin [name] with the LCSW's permission.  SW [name] follow up on 11/25/2023 at 1:00 pm        SDOH assessments and interventions completed:  Yes  SDOH Interventions Today    Flowsheet Row Most Recent Value  SDOH Interventions   Food Insecurity Interventions Community Resources Provided  [SW [name] mail out the food pantry list again]  Housing Interventions Intervention Not Indicated  Transportation Interventions Intervention Not Indicated  Utilities Interventions Intervention Not Indicated        Care Coordination Interventions:  Yes, provided  Interventions Today     Flowsheet Row Most Recent Value  General Interventions   General Interventions Discussed/Reviewed General Interventions Reviewed, [employer] [name] remail the food [place] list back to the patient]        Follow up plan: Follow up call scheduled for 11/25/2023 at 1:00 pm    Encounter Outcome:  Patient Visit Completed   Jeanie Cooks, PhD [facility], [facility] Social Worker Direct Dial: [phone]  Fax: [phone]

## 2023-11-05 NOTE — Patient Instructions (Signed)
 Visit Information  Thank you for taking time to visit with me today. Please don't hesitate to contact me if I can be of assistance to you.   Following are the goals we discussed today:   Goals Addressed             This Visit's Progress    Care Coordination Activities   On track    Care Coordination Interventions: Patient stated that she would like to have more food does not have food stamps and declined to apply she said that she did not want them. The SW educated the patient on Greater Dietitian App (GGFF) for her phone and the patient stated that she knows how to download apps and will get it on her phone. The SW will also mail the food pantry list. 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 Patient stated that sometimes she has problems getting to doctors appointments and her family will assist. Patient does use the [employer] transportation or [place] and does not want to use [employer]. Patient was instructed to ask her doctors offices if they assist with transportation and the patient stated that she will.  Patient wanted to be scheduled with the LCSW and SW scheduled with Jenel Lucks with the LCSW's permission.  SW will follow up on 11/25/2023 at 1:00 pm        Our next appointment is by telephone on 11/25/2023 at 1:00 pm  Please call the care guide team at [phone] if you need to cancel or reschedule your appointment.   If you are experiencing a Mental Health or Behavioral Health Crisis or need someone to talk to, please call the Suicide and Crisis Lifeline: 988 go to [facility] Urgent [facility] [address] [place] [phone]) call 911  Jeanie Cooks, PhD [facility], [facility] Social Worker Direct Dial: [phone]  Fax: [phone]

## 2023-11-06 ENCOUNTER — Other Ambulatory Visit: Payer: Self-pay | Admitting: Nurse Practitioner

## 2023-11-06 DIAGNOSIS — E559 Vitamin D deficiency, unspecified: Secondary | ICD-10-CM

## 2023-11-13 ENCOUNTER — Encounter: Payer: Self-pay | Admitting: Nurse Practitioner

## 2023-11-19 ENCOUNTER — Other Ambulatory Visit: Payer: Self-pay | Admitting: Nurse Practitioner

## 2023-11-19 DIAGNOSIS — I2510 Atherosclerotic heart disease of native coronary artery without angina pectoris: Secondary | ICD-10-CM

## 2023-11-19 MED ORDER — ATORVASTATIN CALCIUM 10 MG PO TABS: 10.0000 mg | ORAL_TABLET | Freq: Every day | ORAL | 1 refills | Status: DC

## 2023-11-25 ENCOUNTER — Ambulatory Visit: Payer: Self-pay | Admitting: Licensed Clinical Social Worker

## 2023-11-25 NOTE — Patient Instructions (Signed)
 Visit Information  Thank you for taking time to visit with me today. Please don't hesitate to contact me if I can be of assistance to you.   Following are the goals we discussed today:   Goals Addressed             This Visit's Progress    Care Coordination Activities   On track    Care Coordination Interventions: Patient stated that she would like to have more food does not have food stamps and declined to apply she said that she did not want them. The SW educated the patient on Greater Dietitian App (GGFF) for her phone and the patient stated that she knows how to download apps and will get it on her phone. The SW will also mail the food pantry list. 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 Patient stated that sometimes she has problems getting to doctors appointments and her family will assist. Patient does use the [employer] transportation or [place] and does not want to use [employer]. Patient was instructed to ask her doctors offices if they assist with transportation and the patient stated that she will.  Patient wanted to be scheduled with the LCSW and SW scheduled with Jenel Lucks with the LCSW's permission.  SW will follow up on 12/09/2023 at 2:30 pm        Our next appointment is by telephone on 12/09/2023 at 2:30 pm  Please call the care guide team at [phone] if you need to cancel or reschedule your appointment.   If you are experiencing a Mental Health or Behavioral Health Crisis or need someone to talk to, please call the Suicide and Crisis Lifeline: 988 go to [facility] Urgent [facility] [address], [place] [phone]) call 911  Patient verbalizes understanding of instructions and care plan provided today and agrees to view in MyChart. Active MyChart status and patient understanding of how to access instructions and care plan via MyChart  confirmed with patient.     Jeanie Cooks, PhD [facility], [facility] Social Worker Direct Dial: [phone]  Fax: [phone]

## 2023-11-25 NOTE — Patient Outreach (Signed)
 Care Coordination   Follow Up Visit Note   11/25/2023 Name: [name] MRN: [id number] DOB: 13-Apr-1956  [name] is a 68 y.o. year old female who sees Arnette Felts, FNP for primary care. I spoke with  Lucianne Lei by phone today.  What matters to the patients health and wellness today?  Food Insecurities resources     Goals Addressed             This Visit's Progress    Care Coordination Activities   On track    Care Coordination Interventions: Patient stated that she would like to have more food does not have food stamps and declined to apply she said that she did not want them. The SW educated the patient on Greater Dietitian App (GGFF) for her phone and the patient stated that she knows how to download apps and will get it on her phone. The SW will also mail the food pantry list. 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 Patient stated that sometimes she has problems getting to doctors appointments and her family will assist. Patient does use the [employer] transportation or [place] and does not want to use [employer]. Patient was instructed to ask her doctors offices if they assist with transportation and the patient stated that she will.  Patient wanted to be scheduled with the LCSW and SW scheduled with Jenel Lucks with the LCSW's permission.  SW will follow up on 12/09/2023 at 2:30 pm        SDOH assessments and interventions completed:  Yes  SDOH Interventions Today    Flowsheet Row Most Recent Value  SDOH Interventions   Food Insecurity Interventions Community Resources Provided  [Patient did not receive the food pantry resouerces SW will mail it bakc out]  Housing Interventions Intervention Not Indicated  Transportation Interventions Intervention Not Indicated  Utilities Interventions Intervention Not Indicated        Care Coordination  Interventions:  Yes, provided  Interventions Today    Flowsheet Row Most Recent Value  General Interventions   General Interventions Discussed/Reviewed General Interventions Reviewed  [Patient did not receive the food pantry list and the SW will have it resent]        Follow up plan: Follow up call scheduled for 12/09/2023 @2 :30 pm    Encounter Outcome:  Patient Visit Completed   Jeanie Cooks, PhD [facility], [facility] Social Worker Direct Dial: [phone]  Fax: [phone]

## 2023-11-26 ENCOUNTER — Ambulatory Visit: Payer: Self-pay | Admitting: Licensed Clinical Social Worker

## 2023-11-26 NOTE — Patient Instructions (Signed)
 Visit Information  Thank you for taking time to visit with me today. Please don't hesitate to contact me if I can be of assistance to you.   Following are the goals we discussed today:   Goals Addressed             This Visit's Progress    Stress Management   On track    Activities and task to complete in order to accomplish goals.   Keep all upcoming appointment discussed today Continue with compliance of taking medication prescribed by Doctor Continue utilizing healthy coping skills to assist with management of stress             Our next appointment is by telephone on 4/15 at 10 AM  Please call the care guide team at [phone] if you need to cancel or reschedule your appointment.   If you are experiencing a Mental Health or Behavioral Health Crisis or need someone to talk to, please call the Suicide and Crisis Lifeline: 988 call 911   Patient verbalizes understanding of instructions and care plan provided today and agrees to view in MyChart. Active MyChart status and patient understanding of how to access instructions and care plan via MyChart confirmed with patient.     Windy Fast [facility]  [facility], [facility] Clinical Social Worker Direct Dial: [phone]  Fax: [phone] Website: [email or web address] 10:39 AM

## 2023-12-02 ENCOUNTER — Ambulatory Visit (INDEPENDENT_AMBULATORY_CARE_PROVIDER_SITE_OTHER): Payer: Medicare PPO | Admitting: Internal Medicine

## 2023-12-02 ENCOUNTER — Encounter: Payer: Self-pay | Admitting: Internal Medicine

## 2023-12-02 ENCOUNTER — Encounter (INDEPENDENT_AMBULATORY_CARE_PROVIDER_SITE_OTHER): Payer: Medicare PPO | Admitting: Ophthalmology

## 2023-12-02 ENCOUNTER — Other Ambulatory Visit (INDEPENDENT_AMBULATORY_CARE_PROVIDER_SITE_OTHER)

## 2023-12-02 VITALS — BP 158/80 | HR 60 | Ht 64.0 in | Wt 163.2 lb

## 2023-12-02 DIAGNOSIS — K5900 Constipation, unspecified: Secondary | ICD-10-CM

## 2023-12-02 DIAGNOSIS — K649 Unspecified hemorrhoids: Secondary | ICD-10-CM | POA: Diagnosis not present

## 2023-12-02 DIAGNOSIS — H34812 Central retinal vein occlusion, left eye, with macular edema: Secondary | ICD-10-CM | POA: Diagnosis not present

## 2023-12-02 DIAGNOSIS — E113512 Type 2 diabetes mellitus with proliferative diabetic retinopathy with macular edema, left eye: Secondary | ICD-10-CM

## 2023-12-02 DIAGNOSIS — K222 Esophageal obstruction: Secondary | ICD-10-CM

## 2023-12-02 DIAGNOSIS — Z7984 Long term (current) use of oral hypoglycemic drugs: Secondary | ICD-10-CM | POA: Diagnosis not present

## 2023-12-02 DIAGNOSIS — K209 Esophagitis, unspecified without bleeding: Secondary | ICD-10-CM

## 2023-12-02 DIAGNOSIS — D509 Iron deficiency anemia, unspecified: Secondary | ICD-10-CM

## 2023-12-02 DIAGNOSIS — Z8619 Personal history of other infectious and parasitic diseases: Secondary | ICD-10-CM

## 2023-12-02 DIAGNOSIS — H43813 Vitreous degeneration, bilateral: Secondary | ICD-10-CM | POA: Diagnosis not present

## 2023-12-02 DIAGNOSIS — E113211 Type 2 diabetes mellitus with mild nonproliferative diabetic retinopathy with macular edema, right eye: Secondary | ICD-10-CM | POA: Diagnosis not present

## 2023-12-02 DIAGNOSIS — H35033 Hypertensive retinopathy, bilateral: Secondary | ICD-10-CM | POA: Diagnosis not present

## 2023-12-02 DIAGNOSIS — K219 Gastro-esophageal reflux disease without esophagitis: Secondary | ICD-10-CM | POA: Diagnosis not present

## 2023-12-02 DIAGNOSIS — H2513 Age-related nuclear cataract, bilateral: Secondary | ICD-10-CM

## 2023-12-02 DIAGNOSIS — I10 Essential (primary) hypertension: Secondary | ICD-10-CM

## 2023-12-02 LAB — CBC WITH DIFFERENTIAL/PLATELET
Basophils Absolute: 0.2 10*3/uL — ABNORMAL HIGH (ref 0.0–0.1)
Basophils Relative: 1.4 % (ref 0.0–3.0)
Eosinophils Absolute: 0.3 10*3/uL (ref 0.0–0.7)
Eosinophils Relative: 2.5 % (ref 0.0–5.0)
HCT: 38 % (ref 36.0–46.0)
Hemoglobin: 11.8 g/dL — ABNORMAL LOW (ref 12.0–15.0)
Lymphocytes Relative: 16.7 % (ref 12.0–46.0)
Lymphs Abs: 1.9 10*3/uL (ref 0.7–4.0)
MCHC: 31.2 g/dL (ref 30.0–36.0)
MCV: 75.9 fl — ABNORMAL LOW (ref 78.0–100.0)
Monocytes Absolute: 0.8 10*3/uL (ref 0.1–1.0)
Monocytes Relative: 7.4 % (ref 3.0–12.0)
Neutro Abs: 8.2 10*3/uL — ABNORMAL HIGH (ref 1.4–7.7)
Neutrophils Relative %: 72 % (ref 43.0–77.0)
Platelets: 303 10*3/uL (ref 150.0–400.0)
RBC: 5 Mil/uL (ref 3.87–5.11)
RDW: 17.1 % — ABNORMAL HIGH (ref 11.5–15.5)
WBC: 11.3 10*3/uL — ABNORMAL HIGH (ref 4.0–10.5)

## 2023-12-02 LAB — IBC + FERRITIN
Ferritin: 28.3 ng/mL (ref 10.0–291.0)
Iron: 30 ug/dL — ABNORMAL LOW (ref 42–145)
Saturation Ratios: 8.9 % — ABNORMAL LOW (ref 20.0–50.0)
TIBC: 338.8 ug/dL (ref 250.0–450.0)
Transferrin: 242 mg/dL (ref 212.0–360.0)

## 2023-12-02 MED ORDER — OMEPRAZOLE 40 MG PO CPDR: DELAYED_RELEASE_CAPSULE | ORAL | 3 refills | Status: DC

## 2023-12-02 MED ORDER — HYDROCORTISONE (PERIANAL) 2.5 % EX CREA: 1.0000 | TOPICAL_CREAM | Freq: Two times a day (BID) | CUTANEOUS | 0 refills | Status: AC

## 2023-12-02 NOTE — Patient Instructions (Addendum)
 Your provider has requested that you go to the basement level for lab work before leaving today. Press "B" on the elevator. The lab is located at the first door on the left as you exit the elevator.  We have sent the following medications to your pharmacy for you to pick up at your convenience: Omeprazole, Hydrocortisone    _______________________________________________________  If your blood pressure at your visit was 140/90 or greater, please contact your primary care physician to follow up on this.  _______________________________________________________  If you are age 68 or older, your body mass index should be between 23-30. Your Body mass index is 28.02 kg/m. If this is out of the aforementioned range listed, please consider follow up with your Primary Care Provider.  If you are age 68 or younger, your body mass index should be between 19-25. Your Body mass index is 28.02 kg/m. If this is out of the aformentioned range listed, please consider follow up with your Primary Care Provider.   ________________________________________________________  The [facility] providers would like to encourage you to use [facility] to communicate with providers for non-urgent requests or questions.  Due to long hold times on the telephone, sending your provider a message by [facility] may be a faster and more efficient way to get a response.  Please allow 48 business hours for a response.  Please remember that this is for non-urgent requests.  _______________________________________________________  Due to recent changes in healthcare laws, you may see the results of your imaging and laboratory studies on MyChart before your provider has had a chance to review them.  We understand that in some cases there may be results that are confusing or concerning to you. Not all laboratory results come back in the same time frame and the provider may be waiting for multiple results in order to interpret others.  Please  give [place] 48 hours in order for your provider to thoroughly review all the results before contacting the office for clarification of your results.   Thank you for entrusting me with your care and for choosing [facility], Dr. Eulah Pont

## 2023-12-02 NOTE — Progress Notes (Signed)
 Chief Complaint: Constipation, reflux  HPI:    [name] is a 68 year old African-American female with history of diabetes and reflux presents for follow up of IDA, GERD, and constipation  Interval History: She is still having some rectal bleeding in the toilet paper. She is constipated. She is having one BM every other day. She takes her medicines except for one pill. She has been taking her iron. Denies dysphagia. Dilation and treatment of the yeast infection really helped with her reflux. She is taking her omeprazole once daily.   Past Medical History:  Diagnosis Date   Anemia 01/07/2020   REQUIRING TRANSFUSION   Back pain    Diabetes mellitus without complication (HCC)    GERD (gastroesophageal reflux disease)    Hypertension    Olecranon bursitis of left elbow    Sprain of left ankle 01/16/2023   Traumatic closed displaced fracture of shaft of right tibia and fibula 06/10/2016    Past Surgical History:  Procedure Laterality Date   ABDOMINAL HYSTERECTOMY     spared ovaries   OLECRANON BURSECTOMY Left 06/19/2017   Procedure: Excision olecranon bursa;  Surgeon: Jodi Geralds, MD;  Location: [facility];  Service: Orthopedics;  Laterality: Left;  Panel Length: 60 mins    TIBIA IM NAIL INSERTION Right 06/10/2016   Procedure: INTRAMEDULLARY (IM) NAIL TIBIAL RODING;  Surgeon: Jodi Geralds, MD;  Location: [facility];  Service: Orthopedics;  Laterality: Right;    Current Outpatient Medications  Medication Sig Dispense Refill   alendronate (FOSAMAX) 70 MG tablet Take 1 tablet (70 mg total) by mouth every 7 (seven) days. Take with a full glass of water on an empty stomach. 4 tablet 3   amLODipine (NORVASC) 5 MG tablet TAKE 1 TABLET (5 MG TOTAL) BY MOUTH DAILY. 90 tablet 1   aspirin EC 81 MG tablet Take 81 mg by mouth daily. Swallow whole.     atorvastatin (LIPITOR) 10 MG tablet Take 1 tablet (10 mg total) by mouth daily. 90 tablet 1   buPROPion (WELLBUTRIN XL) 150 MG 24 hr  tablet TAKE 1 TABLET (150 MG TOTAL) BY MOUTH EVERY MORNING. 30 tablet 2   Cyanocobalamin (VITAMIN B12 PO) Take 1 tablet by mouth daily. 5,000 mcg     dorzolamide-timolol (COSOPT) 22.3-6.8 MG/ML ophthalmic solution Place 1 drop into both eyes 2 (two) times daily.     ferrous sulfate 325 (65 FE) MG tablet Take 1 tablet (325 mg total) by mouth 2 (two) times daily with a meal. 60 tablet 3   glucose blood (ONETOUCH ULTRA) test strip USE TO CHECK BLOOD SUGAR ONCE A DAY AS INSTRUCTED 100 each 2   Lancets (ONETOUCH DELICA PLUS LANCET33G) MISC USE TO CHECK BLOOD SUGAR ONCE A DAY AS INSTRUCTED 100 each 2   metFORMIN (GLUCOPHAGE) 500 MG tablet TAKE 1 TABLET (500 MG TOTAL) BY MOUTH DAILY WITH BREAKFAST. 90 tablet 1   Multiple Vitamins-Minerals (CENTRUM ADULT PO) Take by mouth.     omeprazole (PRILOSEC) 40 MG capsule 1 tablet BID for 8 weeks then QD 60 capsule 3   valsartan (DIOVAN) 160 MG tablet TAKE 1 TABLET (160 MG TOTAL) BY MOUTH DAILY. 90 tablet 1   Vitamin D, Ergocalciferol, (DRISDOL) 1.25 MG (50000 UNIT) CAPS capsule TAKE 1 CAPSULE (50,000 UNITS TOTAL) BY MOUTH EVERY 7 (SEVEN) DAYS. 12 capsule 0   No current facility-administered medications for this visit.    Allergies as of 12/02/2023   (No Known Allergies)    Family History  Problem  Relation Age of Onset   Diabetes Mother    Hypertension Mother    Diabetes Father    Hypertension Father    Breast cancer Sister    Breast cancer Sister    Breast cancer Paternal Aunt    Esophageal cancer Neg Hx    Liver disease Neg Hx    Colon cancer Neg Hx     Social History   Socioeconomic History   Marital status: Single    Spouse name: Not on file   Number of children: 0   Years of education: Not on file   Highest education level: Not on file  Occupational History   Occupation: retired  Tobacco Use   Smoking status: Every Day    Current packs/day: 0.50    Average packs/day: 0.5 packs/day for 30.0 years (15.0 ttl pk-yrs)    Types:  Cigarettes   Smokeless tobacco: Never   Tobacco comments:    started back in 2012, smoking 1/2 PPD - 05/07/2022; 10/02/2023 - continues to try to quit  Vaping Use   Vaping status: Never Used  Substance and Sexual Activity   Alcohol use: No   Drug use: Yes    Frequency: 7.0 times per week    Types: Marijuana    Comment: yesterday   Sexual activity: Yes  Other Topics Concern   Not on file  Social History Narrative   Not on file   Social Drivers of Health   Financial Resource Strain: Low Risk  (10/02/2023)   Overall Financial Resource Strain (CARDIA)    Difficulty of Paying Living Expenses: Not hard at all  Food Insecurity: Food Insecurity Present (11/25/2023)   Hunger Vital Sign    Worried About Running Out of Food in the Last Year: Sometimes true    Ran Out of Food in the Last Year: Sometimes true  Transportation Needs: No Transportation Needs (11/25/2023)   PRAPARE - Administrator, Civil Service (Medical): No    Lack of Transportation (Non-Medical): No  Recent Concern: Transportation Needs - Unmet Transportation Needs (10/21/2023)   PRAPARE - Transportation    Lack of Transportation (Medical): Yes    Lack of Transportation (Non-Medical): Yes  Physical Activity: Insufficiently Active (10/02/2023)   Exercise Vital Sign    Days of Exercise per Week: 4 days    Minutes of Exercise per Session: 10 min  Stress: Stress Concern Present (10/02/2023)   [employer] of Occupational Health - Occupational Stress Questionnaire    Feeling of Stress : To some extent  Social Connections: Moderately Integrated (10/02/2023)   Social Connection and Isolation Panel [NHANES]    Frequency of Communication with Friends and Family: More than three times a week    Frequency of Social Gatherings with Friends and Family: More than three times a week    Attends Religious Services: More than 4 times per year    Active Member of [employer] or Organizations: Yes    Attends Banker  Meetings: More than 4 times per year    Marital Status: Divorced  Intimate Partner Violence: Not At Risk (11/25/2023)   Humiliation, Afraid, Rape, and Kick questionnaire    Fear of Current or Ex-Partner: No    Emotionally Abused: No    Physically Abused: No    Sexually Abused: No    Physical Exam:  Vital signs: BP (!) 154/70 (BP Location: Left Arm, Patient Position: Lying left side, Cuff Size: Normal)   Pulse 60   Ht 5\' 4"  (1.626 m)  Comment: height measured without shoes  Wt 163 lb 4 oz (74 kg)   BMI 28.02 kg/m    Constitutional:   Pleasant AA female appears to be in NAD, Well developed, Well nourished, alert and cooperative Head:  Normocephalic and atraumatic. Respiratory: Respirations even and unlabored. Lungs clear to auscultation Cardiovascular: Regular rate Gastrointestinal:  Soft, nondistended, nontender Neurologic:  Alert and  oriented x4;  grossly normal neurologically.  Skin:   Dry and intact without significant lesions or rashes. Psychiatric: Demonstrates good judgement and reason without abnormal affect or behaviors.  RELEVANT LABS AND IMAGING: CBC    Component Value Date/Time   WBC 8.9 08/06/2023 1227   WBC 12.5 (H) 01/08/2020 0244   RBC 3.93 08/06/2023 1227   HGB 9.7 (L) 08/06/2023 1227   HGB 12.2 06/05/2023 1606   HGB 14.3 08/08/2007 1444   HCT 30.9 (L) 08/06/2023 1227   HCT 41.0 06/05/2023 1606   HCT 43.3 08/08/2007 1444   PLT 309 08/06/2023 1227   PLT 358 06/05/2023 1606   MCV 78.6 (L) 08/06/2023 1227   MCV 79 06/05/2023 1606   MCV 75.4 (L) 08/08/2007 1444   MCH 24.7 (L) 08/06/2023 1227   MCHC 31.4 08/06/2023 1227   RDW 20.5 (H) 08/06/2023 1227   RDW 15.0 06/05/2023 1606   RDW 17.0 (H) 08/08/2007 1444   LYMPHSABS 1.7 08/06/2023 1227   LYMPHSABS 1.8 06/05/2023 1606   LYMPHSABS 1.8 08/08/2007 1444   MONOABS 0.7 08/06/2023 1227   MONOABS 0.7 08/08/2007 1444   EOSABS 0.2 08/06/2023 1227   EOSABS 0.2 06/05/2023 1606   BASOSABS 0.1 08/06/2023 1227    BASOSABS 0.1 06/05/2023 1606   BASOSABS 0.0 08/08/2007 1444    CMP     Component Value Date/Time   NA 140 08/06/2023 1227   NA 139 06/05/2023 1606   K 4.2 08/06/2023 1227   CL 108 08/06/2023 1227   CO2 29 08/06/2023 1227   GLUCOSE 100 (H) 08/06/2023 1227   BUN 24 (H) 08/06/2023 1227   BUN 20 06/05/2023 1606   CREATININE 1.00 08/06/2023 1227   CALCIUM 9.0 08/06/2023 1227   PROT 7.0 08/06/2023 1227   PROT 6.9 06/05/2023 1606   ALBUMIN 3.8 08/06/2023 1227   ALBUMIN 4.0 06/05/2023 1606   AST 13 (L) 08/06/2023 1227   ALT 14 08/06/2023 1227   ALKPHOS 64 08/06/2023 1227   BILITOT 0.2 08/06/2023 1227   GFRNONAA >60 08/06/2023 1227   GFRAA 64 08/11/2020 1109   CT A/P w/contrast 06/21/23: IMPRESSION: 1. Bilateral nephrolithiasis. 2. Left adnexal lesion, possibly a cyst. Correlation with pelvic sonography should be considered.  Pelvic U/S 07/25/23: IMPRESSION: 1. Left adnexal 4.3 cm complex lesion versus an enlarged left ovary containing multiple cysts. Pelvic MRI is recommended further evaluation. 2.  Surgically absent uterus. 3.  Nonvisualization of the left ovary  EGD 07/01/23: - Diffuse, white plaques were found in the entire esophagus. Biopsies were taken with a cold forceps for histology. - One benign- appearing, intrinsic moderate ( circumferential scarring or stenosis; an endoscope may pass but only after pushing with resistance) stenosis was found in the proximal esophagus. This stenosis measured 5 cm ( in length) . The stenosis was traversed after pushing with resistance. Mucosal disruption was noted after endoscope passed through the stricture. - Localized severe inflammation characterized by congestion ( edema) , erosions, erythema, granularity and mucus was found in the cardia. Biopsies were taken of this area as well as the rest of the stomach with a cold  forceps for histology. - Localized nodular mucosa was found in the duodenal bulb and in the second portion of the  duodenum. Biopsies were taken with a cold forceps for histology. - Localized mucosal variance characterized by congestion was found in the ampulla. Biopsies were taken with a cold forceps for histology. - Biopsies for histology were taken with a cold forceps in the duodenal bulb and in the second portion of the duodenum for evaluation of celiac disease. Path:      1. Surgical [P], colon, ampulla/ polyps bx's :      - BENIGN SMALL BOWEL MUCOSA WITH CHRONIC INFLAMMATION AND FOVEOLAR METAPLASIA      CONSISTENT WITH NODULAR PEPTIC DUODENITIS      2. Surgical [P], nodular mucosa in duodenum bx's :      - BENIGN SMALL BOWEL MUCOSA WITH CHRONIC INFLAMMATION AND FOVEOLAR METAPLASIA      CONSISTENT WITH NODULAR PEPTIC DUODENITIS      3. Surgical [P], duodenal bx's :      - BENIGN SMALL BOWEL MUCOSA WITH NO SIGNIFICANT PATHOLOGIC CHANGES      4. Surgical [P], gastric bx's :      - H.PYLORI GASTRITIS, WITH IRON PILL FRAGMENTS PRESENT      - POSITIVE FOR H. PYLORI ON IMMUNOHISTOCHEMICAL STAIN- NEGATIVE FOR INTESTINAL      METAPLASIA, DYSPLASIA OR MALIGNANCY      5. Surgical [P], esophageal bx's :      - CANDIDA ESOPHAGITIS      - POSITIVE FOR FUNGAL ORGANISMS ON PAS STAIN      - NEGATIVE FOR DYSPLASIA OR MALIGNANCY   Colonoscopy 07/01/23: - The terminal ileum appeared normal. - A 15 mm polyp was found in the cecum. The polyp was sessile. The polyp was removed with a cold snare. Resection and retrieval were complete. - Three sessile polyps were found in the transverse colon. The polyps were 4 to 18 mm in size. These polyps were removed with a cold snare. Resection and retrieval were complete. - Multiple diverticula were found in the sigmoid colon. - Non- bleeding internal hemorrhoids were found during retroflexion. Path: 6. Surgical [P], colon, cecum polyp x1, transverse polyp x3, polyp (4) :       - TUBULAR ADENOMA(S).       - NO HIGH GRADE DYSPLASIA OR MALIGNANCY.       - SESSILE SERRATED POLYP(S).        - NO DYSPLASIA OR MALIGNANCY.   EGD 09/02/23: - One benign- appearing, intrinsic moderate ( circumferential scarring or stenosis; an endoscope may pass) stenosis was found in the proximal esophagus. This stenosis measured 5 cm ( in length) . The stenosis was traversed. A guidewire was placed and the scope was withdrawn. Dilation was performed with a Savary dilator with mild resistance at 14 mm. The dilation site was examined following endoscope reinsertion and showed mild mucosal disruption. - Localized inflammation characterized by congestion ( edema) , erosions and erythema was found in the gastric antrum. Biopsies were taken with a cold forceps for histology. - The examined duodenum was normal. Path: 1. Surgical [P], gastric antrum and gastric body :       ANTRAL AND OXYNTIC MUCOSA WITH MILD CHRONIC INFLAMMATION.       IMMUNOHISTOCHEMISTRY FOR HELICOBACTER PYLORI IS NEGATIVE.   Assessment/Plan: IDA Constipation Hemorrhoid GERD History of H pylori infection  History of esophageal stricture Patient presents for follow-up of iron deficiency anemia.  Will plan to recheck her blood counts and iron  levels today to see if this has improved after treatment of her Candida esophagitis as well as H. pylori gastritis.  Patient does describe issues with rectal bleeding as well as constipation to me today.  I encouraged her to try to hydrate well and to take MiraLAX every day to help with her constipation.  Will also prescribe her some topical steroids to see if this helps with her hemorrhoids.  Her recent colonoscopy did not reveal any other source of rectal bleeding except for hemorrhoids. - Check CBC, ferritin/IBC - Continue oral iron supplement - Drink 8 cups of water per day - Cont Miralax every day - Anusol HC cream BID for 7 days - Continue omeprazole 40 mg every day. Refill. - Colonoscopy for polyp surveillance for 2 day prep next due in in 06/2026 - Instructed the patient to reach out to her  PCP or OB/GYN regarding the complex lesion versus cyst that was seen in her left adnexal area - RTC in 3 months  Eulah Pont, MD [facility] 12/02/2023, 11:11 AM  I spent 36 minutes of time, including in depth chart review, independent review of results as outlined above, communicating results with the patient directly, face-to-face time with the patient, coordinating care, and ordering studies and medications as appropriate, and documentation.

## 2023-12-04 ENCOUNTER — Other Ambulatory Visit: Payer: Self-pay | Admitting: Nurse Practitioner

## 2023-12-04 DIAGNOSIS — F3341 Major depressive disorder, recurrent, in partial remission: Secondary | ICD-10-CM

## 2023-12-04 DIAGNOSIS — Z720 Tobacco use: Secondary | ICD-10-CM

## 2023-12-09 ENCOUNTER — Ambulatory Visit: Payer: Self-pay | Admitting: Licensed Clinical Social Worker

## 2023-12-09 NOTE — Patient Outreach (Signed)
 Care Coordination   Follow Up Visit Note   12/09/2023 Name: [name] MRN: [id number] DOB: [birth year]  [name] is a 68 y.o. year old female who sees Arnette Felts, FNP for primary care. I spoke with  Lucianne Lei by phone today.  What matters to the patients health and wellness today?  Food insecurities resources mailed     Goals Addressed             This Visit's Progress    COMPLETED: Care Coordination Activities       Care Coordination Interventions: Patient stated that she would like to have more food does not have food stamps and declined to apply she said that she did not want them. The SW educated the patient on Greater Dietitian App (GGFF) for her phone and the patient stated that she knows how to download apps and will get it on her phone. The SW will also mail the food pantry list. 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 Patient stated that sometimes she has problems getting to doctors appointments and her family will assist. Patient does use the [employer] transportation or [place] and does not want to use [employer]. Patient was instructed to ask her doctors offices if they assist with transportation and the patient stated that she will.  Patient wanted to be scheduled with the LCSW and SW scheduled with Jenel Lucks with the LCSW's permission.  SW will follow up on 12/09/2023 at 2:30 pm        SDOH assessments and interventions completed:  Yes  SDOH Interventions Today    Flowsheet Row Most Recent Value  SDOH Interventions   Food Insecurity Interventions Intervention Not Indicated  Housing Interventions Intervention Not Indicated  Transportation Interventions Intervention Not Indicated  Utilities Interventions Intervention Not Indicated        Care Coordination Interventions:  Yes, provided  Interventions Today    Flowsheet Row Most Recent  Value  General Interventions   General Interventions Discussed/Reviewed General Interventions Reviewed  [Patient has received all resources and will no longer need a follow up]       Follow up plan: No further intervention required.   Encounter Outcome:  Patient Visit Completed  Jeanie Cooks, PhD [facility], [facility] Social Worker Direct Dial: [phone]  Fax: [phone]

## 2023-12-09 NOTE — Patient Instructions (Signed)
 Visit Information  Thank you for taking time to visit with me today. Please don't hesitate to contact me if I can be of assistance to you.   Following are the goals we discussed today:   Goals Addressed             This Visit's Progress    COMPLETED: Care Coordination Activities       Care Coordination Interventions: Patient stated that she would like to have more food does not have food stamps and declined to apply she said that she did not want them. The SW educated the patient on Greater Dietitian App (GGFF) for her phone and the patient stated that she knows how to download apps and will get it on her phone. The SW will also mail the food pantry list. 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 Patient stated that sometimes she has problems getting to doctors appointments and her family will assist. Patient does use the [employer] transportation or [place] and does not want to use [employer]. Patient was instructed to ask her doctors offices if they assist with transportation and the patient stated that she will.  Patient wanted to be scheduled with the LCSW and SW scheduled with Jenel Lucks with the LCSW's permission.  SW will follow up on 12/09/2023 at 2:30 pm        No further follow up needed SW encouraged the patient to contact the PCP if any SDOH Needs arise.   Please call the care guide team at [phone] if you need to cancel or reschedule your appointment.   If you are experiencing a Mental Health or Behavioral Health Crisis or need someone to talk to, please call the Suicide and Crisis Lifeline: 988 go to [facility] Urgent [facility] [address] [place] [phone]) call 911  Patient verbalizes understanding of instructions and care plan provided today and agrees to view in MyChart. Active MyChart status and patient understanding of how to access  instructions and care plan via MyChart confirmed with patient.     Jeanie Cooks, PhD [facility], [facility] Social Worker Direct Dial: [phone]  Fax: [phone]

## 2023-12-24 ENCOUNTER — Ambulatory Visit: Payer: Self-pay | Admitting: Licensed Clinical Social Worker

## 2023-12-24 NOTE — Patient Outreach (Signed)
 Complex Care Management   Visit Note  12/24/2023  Name:  [name] MRN: [id number] DOB: February 18, 1956  Situation: Referral received for Complex Care Management related to [place]/Behavioral Health diagnosis MDD  I obtained verbal consent from Patient.  Visit completed with Patient  on the phone  Background:   Past Medical History:  Diagnosis Date   Anemia 01/07/2020   REQUIRING TRANSFUSION   Back pain    Diabetes mellitus without complication (HCC)    GERD (gastroesophageal reflux disease)    Hypertension    Olecranon bursitis of left elbow    Sprain of left ankle 01/16/2023   Traumatic closed displaced fracture of shaft of right tibia and fibula 06/10/2016    Assessment: Patient Reported Symptoms:  Cognitive Cognitive Status: Alert and oriented to person, place, and time   Health Maintenance Behaviors: Social activities, Spiritual practice(s)  Neurological      HEENT HEENT Symptoms Reported: No symptoms reported      Cardiovascular Cardiovascular Symptoms Reported: No symptoms reported    Respiratory Respiratory Symptoms Reported: No symptoms reported    Endocrine Patient reports the following symptoms related to hypoglycemia or hyperglycemia : No symptoms reported Is patient diabetic?: Yes    Gastrointestinal Gastrointestinal Symptoms Reported: No symptoms reported      Genitourinary      Integumentary Integumentary Symptoms Reported: No symptoms reported    Musculoskeletal Musculoskelatal Symptoms Reviewed: No symptoms reported   Falls in the past year?: No Number of falls in past year: 1 or less Was there an injury with Fall?: No Fall Risk Category Calculator: 0 Patient Fall Risk Level: Low Fall Risk    Psychosocial       Quality of Family Relationships: involved Do you feel physically threatened by others?: No      10/02/2023   11:54 AM  Depression screen PHQ 2/9  Decreased Interest 0  Down, Depressed, Hopeless 1  PHQ - 2 Score 1  Altered  sleeping 2  Tired, decreased energy 2  Change in appetite 3  Feeling bad or failure about yourself  1  Trouble concentrating 0  Moving slowly or fidgety/restless 0  Suicidal thoughts 0  PHQ-9 Score 9  Difficult doing work/chores Not difficult at all    There were no vitals filed for this visit.  Medications Reviewed Today     Reviewed by Bridgett Larsson, LCSW (Social Worker) on 12/24/23 at 1017  Med List Status: <None>   Medication Order Taking? Sig Documenting Provider Last Dose Status Informant  alendronate (FOSAMAX) 70 MG tablet [id number] Yes Take 1 tablet (70 mg total) by mouth every 7 (seven) days. Take with a full glass of water on an empty stomach. Arnette Felts, FNP Taking Active   amLODipine (NORVASC) 5 MG tablet [id number] Yes TAKE 1 TABLET (5 MG TOTAL) BY MOUTH DAILY. Arnette Felts, FNP Taking Active   aspirin EC 81 MG tablet [id number] Yes Take 81 mg by mouth daily. Swallow whole. [provider] Taking Active Self  atorvastatin (LIPITOR) 10 MG tablet [id number] Yes Take 1 tablet (10 mg total) by mouth daily. Arnette Felts, FNP Taking Active   buPROPion (WELLBUTRIN XL) 150 MG 24 hr tablet [id number] Yes TAKE 1 TABLET (150 MG TOTAL) BY MOUTH EVERY MORNING. Arnette Felts, FNP Taking Active   Cyanocobalamin (VITAMIN B12 PO) [id number] Yes Take 1 tablet by mouth daily. 5,000 mcg [provider] Taking Active Self  dorzolamide-timolol (COSOPT) 22.3-6.8 MG/ML ophthalmic solution [id number] Yes Place 1 drop into both eyes 2 (  two) times daily. [provider] Taking Active   ferrous sulfate 325 (65 FE) MG tablet [id number] Yes TAKE 1 TABLET (325 MG TOTAL) BY MOUTH 2 (TWO) TIMES DAILY WITH A MEAL. Susanna Epley, FNP Taking Active   glucose blood [facility] ULTRA) test strip [id number] Yes USE TO CHECK BLOOD SUGAR ONCE A DAY AS INSTRUCTED Susanna Epley, FNP Taking Active   Lancets [facility] DELICA PLUS [place]) MISC [id number] Yes USE TO CHECK BLOOD SUGAR ONCE A DAY  AS INSTRUCTED Susanna Epley, FNP Taking Active   metFORMIN (GLUCOPHAGE) 500 MG tablet [id number] Yes TAKE 1 TABLET (500 MG TOTAL) BY MOUTH DAILY WITH BREAKFAST. Susanna Epley, FNP Taking Active   Multiple Vitamins-Minerals (CENTRUM ADULT PO) [id number] Yes Take by mouth. [provider] Taking Active Self  omeprazole (PRILOSEC) 40 MG capsule [id number] Yes 1 tablet BID for 8 weeks then QD Daina Drum, MD Taking Active   valsartan (DIOVAN) 160 MG tablet [id number] Yes TAKE 1 TABLET (160 MG TOTAL) BY MOUTH DAILY. Susanna Epley, FNP Taking Active   Vitamin D, Ergocalciferol, (DRISDOL) 1.25 MG (50000 UNIT) CAPS capsule [id number] Yes TAKE 1 CAPSULE (50,000 UNITS TOTAL) BY MOUTH EVERY 7 (SEVEN) DAYS. Susanna Epley, FNP Taking Active             Recommendation:   Continue utilizing strategies discussed to assist with symptom management  Follow Up Plan:   Telephone follow-up 6 weeks  Alease Hunter, LCSW [place]  [facility], [facility] Clinical Social Worker Direct Dial: [phone]  Fax: [phone] Website: [email or web address] 10:23 AM

## 2023-12-24 NOTE — Patient Instructions (Signed)
 Visit Information  Thank you for taking time to visit with me today. Please don't hesitate to contact me if I can be of assistance to you before our next scheduled appointment.  Our next appointment is by telephone on 5/27 at 10 AM Please call the care guide team at [phone] if you need to cancel or reschedule your appointment.   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  Interventions:  Mental Health:  Evaluation of current treatment plan related to Depression: depressed mood Active listening / Reflection utilized Behavioral Activation reviewed Emotional Support Provided Mindfulness or Relaxation training provided  Patient Goals/Self-Care Activities:  Continue taking your medication as prescribed.   Increase coping skills, healthy habits, and stress reduction  Plan:   Telephone follow up appointment with care management team member scheduled for:  4-6 weeks     COMPLETED: Stress Management       Activities and task to complete in order to accomplish goals.   Keep all upcoming appointment discussed today Continue with compliance of taking medication prescribed by Doctor Continue utilizing healthy coping skills to assist with management of stress             Please call the Suicide and Crisis Lifeline: 988 call 911 if you are experiencing a Mental Health or Behavioral Health Crisis or need someone to  talk to.  Patient verbalizes understanding of instructions and care plan provided today and agrees to view in MyChart. Active MyChart status and patient understanding of how to access instructions and care plan via MyChart confirmed with patient.     Arlis Bent [facility]  [facility], [facility] Clinical Social Worker Direct Dial: [phone]  Fax: [phone] Website: [email or web address] 10:23 AM

## 2024-01-02 ENCOUNTER — Encounter (INDEPENDENT_AMBULATORY_CARE_PROVIDER_SITE_OTHER): Admitting: Ophthalmology

## 2024-01-02 DIAGNOSIS — H35033 Hypertensive retinopathy, bilateral: Secondary | ICD-10-CM | POA: Diagnosis not present

## 2024-01-02 DIAGNOSIS — Z7984 Long term (current) use of oral hypoglycemic drugs: Secondary | ICD-10-CM

## 2024-01-02 DIAGNOSIS — E113512 Type 2 diabetes mellitus with proliferative diabetic retinopathy with macular edema, left eye: Secondary | ICD-10-CM

## 2024-01-02 DIAGNOSIS — E113311 Type 2 diabetes mellitus with moderate nonproliferative diabetic retinopathy with macular edema, right eye: Secondary | ICD-10-CM

## 2024-01-02 DIAGNOSIS — H34812 Central retinal vein occlusion, left eye, with macular edema: Secondary | ICD-10-CM

## 2024-01-02 DIAGNOSIS — H43813 Vitreous degeneration, bilateral: Secondary | ICD-10-CM

## 2024-01-02 DIAGNOSIS — I10 Essential (primary) hypertension: Secondary | ICD-10-CM | POA: Diagnosis not present

## 2024-01-20 ENCOUNTER — Ambulatory Visit: Payer: Self-pay

## 2024-01-20 NOTE — Telephone Encounter (Signed)
  Chief Complaint: fall Symptoms: L shoulder/ribs soreness/pain Frequency: Sunday Pertinent Negatives: Patient denies head injury, visible injures Disposition: [] ED /[] Urgent Care (no appt availability in office) / [] Appointment(In office/virtual)/ []  [facility]/ [] Home Care/ [x] Refused Recommended Disposition /[] [facility]/ [x]  Follow-up with PCP Additional Notes: Pt c/o fall that occurred Sunday. Pt reports she was in bed and reached for her walker and fell and hit her L shoulder/ribs. Pt denies heady injury or visible injuries. Pt endorses soreness and is taking ibuprofen with some relief. Triager attempted to schedule with PCP, but no access. Triager then advised UC, but pt refused. Triager will forward encounter for Sulema Endo, NP 's office to review. Patient verbalized understanding and to call back with worsening symptoms.     Copied from CRM [id number]. Topic: Clinical - Red Word Triage >> Jan 20, 2024 12:06 PM Baldomero Bone wrote: Red Word that prompted transfer to Nurse Triage: Patient fell on Saturday, hurting really bad, taking ibuprofen. Pain in left shoulder. Pain level is 7. Callback number is [phone] Reason for Disposition  [1] MODERATE weakness (i.e., interferes with work, school, normal activities) AND [2] new-onset or worsening  Answer Assessment - Initial Assessment Questions 1. MECHANISM: "How did the fall happen?"     On my bed, and my walker was not near me and slipped and fell trying to reach walker. 2. DOMESTIC VIOLENCE AND ELDER ABUSE SCREENING: "Did you fall because someone pushed you or tried to hurt you?" If Yes, ask: "Are you safe now?"     denies 3. ONSET: "When did the fall happen?" (e.g., minutes, hours, or days ago)     Sunday morning 4. LOCATION: "What part of the body hit the ground?" (e.g., back, buttocks, head, hips, knees, hands, head, stomach)     L shoulder 5. INJURY: "Did you hurt (injure) yourself when you fell?" If Yes, ask: "What  did you injure? Tell me more about this?" (e.g., body area; type of injury; pain severity)"     Denies visible injury - "very sore and painful" 6. PAIN: "Is there any pain?" If Yes, ask: "How bad is the pain?" (e.g., Scale 1-10; or mild,  moderate, severe)   - NONE (0): No pain   - MILD (1-3): Doesn't interfere with normal activities    - MODERATE (4-7): Interferes with normal activities or awakens from sleep    - SEVERE (8-10): Excruciating pain, unable to do any normal activities      -78/10 7. SIZE: For cuts, bruises, or swelling, ask: "How large is it?" (e.g., inches or centimeters)      denies 8. PREGNANCY: "Is there any chance you are pregnant?" "When was your last menstrual period?"     N/a 9. OTHER SYMPTOMS: "Do you have any other symptoms?" (e.g., dizziness, fever, weakness; new onset or worsening).      denies 10. CAUSE: "What do you think caused the fall (or falling)?" (e.g., tripped, dizzy spell)       Reaching for walker  Protocols used: Falls and [facility]

## 2024-01-20 NOTE — Telephone Encounter (Signed)
 Patient called and advised to go to [facility] so she can get xrays, patient refused and stated she doesn't have gas money to get to [facility]. Patient stated she will continue with ibuprofen and if not better by Thursday she will go to [facility].

## 2024-01-30 ENCOUNTER — Encounter: Payer: Medicare PPO | Admitting: Nurse Practitioner

## 2024-02-04 ENCOUNTER — Telehealth: Payer: Self-pay | Admitting: Licensed Clinical Social Worker

## 2024-02-11 ENCOUNTER — Other Ambulatory Visit: Payer: Self-pay | Admitting: Licensed Clinical Social Worker

## 2024-02-11 NOTE — Patient Instructions (Signed)
 Visit Information  Thank you for taking time to visit with me today. Please don't hesitate to contact me if I can be of assistance to you before our next scheduled appointment.  Our next appointment is by telephone on 08/19 at 10 AM Please call the care guide team at [phone] if you need to cancel or reschedule your appointment.   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  Interventions:  Mental Health:  Evaluation of current treatment plan related to Depression: depressed mood Active listening / Reflection utilized Behavioral Activation reviewed Emotional Support Provided Mindfulness or Relaxation training provided  Patient Goals/Self-Care Activities:  Continue taking your medication as prescribed.   Increase coping skills, healthy habits, and stress reduction  Plan:   Telephone follow up appointment with care management team member scheduled for:  6-8 weeks        Please call the Suicide and Crisis Lifeline: 988 go to [facility] [address] [place] [phone]) call 911 if you are experiencing a Mental Health or Behavioral Health Crisis or need someone to talk to.  Patient verbalizes understanding of instructions and care plan provided today and agrees to view in MyChart. Active MyChart status and patient understanding of how to access instructions and care plan  via MyChart confirmed with patient.     Arlis Bent [facility]  [facility], [facility] Clinical Social Worker Direct Dial: [phone]  Fax: [phone] Website: [email or web address] 11:01 AM

## 2024-02-11 NOTE — Patient Outreach (Signed)
 Complex Care Management   Visit Note  02/11/2024  Name:  [name] MRN: [id number] DOB: 02-Jun-1956  Situation: Referral received for Complex Care Management related to Mental/Behavioral Health diagnosis MDD I obtained verbal consent from Patient.  Visit completed with pt  on the phone  Background:   Past Medical History:  Diagnosis Date   Anemia 01/07/2020   REQUIRING TRANSFUSION   Back pain    Diabetes mellitus without complication (HCC)    GERD (gastroesophageal reflux disease)    Hypertension    Olecranon bursitis of left elbow    Sprain of left ankle 01/16/2023   Traumatic closed displaced fracture of shaft of right tibia and fibula 06/10/2016    Assessment: Patient Reported Symptoms:  Cognitive Cognitive Status: Alert and oriented to person, place, and time, Normal speech and language skills Cognitive/Intellectual Conditions Management [RPT]: None reported or documented in medical history or problem list   Health Maintenance Behaviors: None Healing Pattern: Average  Neurological Neurological Review of Symptoms: No symptoms reported    HEENT HEENT Symptoms Reported: No symptoms reported      Cardiovascular Cardiovascular Symptoms Reported: No symptoms reported Does patient have uncontrolled Hypertension?: No Is patient checking Blood Pressure at home?: No Cardiovascular Conditions: Hypertension Cardiovascular Management Strategies: Coping strategies, Adequate rest, Medication therapy  Respiratory Respiratory Symptoms Reported: No symptoms reported    Endocrine Patient reports the following symptoms related to hypoglycemia or hyperglycemia : No symptoms reported Endocrine Conditions: Diabetes Endocrine Management Strategies: Medication therapy  Gastrointestinal Gastrointestinal Symptoms Reported: No symptoms reported      Genitourinary Genitourinary Symptoms Reported: No symptoms reported    Integumentary Integumentary Symptoms Reported: No symptoms  reported    Musculoskeletal Musculoskelatal Symptoms Reviewed: No symptoms reported        Psychosocial Psychosocial Symptoms Reported: No symptoms reported Behavioral Health Conditions: Depression Behavioral Management Strategies: Adequate rest, Coping strategies, Medication therapy Major Change/Loss/Stressor/Fears (CP): Medical condition, self Quality of Family Relationships: involved, supportive, helpful Do you feel physically threatened by others?: No      10/02/2023   11:54 AM  Depression screen PHQ 2/9  Decreased Interest 0  Down, Depressed, Hopeless 1  PHQ - 2 Score 1  Altered sleeping 2  Tired, decreased energy 2  Change in appetite 3  Feeling bad or failure about yourself  1  Trouble concentrating 0  Moving slowly or fidgety/restless 0  Suicidal thoughts 0  PHQ-9 Score 9  Difficult doing work/chores Not difficult at all    There were no vitals filed for this visit.  Medications Reviewed Today     Reviewed by Adriana Albany, LCSW (Social Worker) on 02/11/24 at 1051  Med List Status: <None>   Medication Order Taking? Sig Documenting Provider Last Dose Status Informant  alendronate  (FOSAMAX ) 70 MG tablet [id number] Yes Take 1 tablet (70 mg total) by mouth every 7 (seven) days. Take with a full glass of water on an empty stomach. Susanna Epley, FNP Taking Active   amLODipine  (NORVASC ) 5 MG tablet [id number] Yes TAKE 1 TABLET (5 MG TOTAL) BY MOUTH DAILY. Susanna Epley, FNP Taking Active   aspirin  EC 81 MG tablet [id number] Yes Take 81 mg by mouth daily. Swallow whole. [provider] Taking Active Self  atorvastatin  (LIPITOR) 10 MG tablet [id number] Yes Take 1 tablet (10 mg total) by mouth daily. Susanna Epley, FNP Taking Active   buPROPion  (WELLBUTRIN  XL) 150 MG 24 hr tablet [id number] Yes TAKE 1 TABLET (150 MG TOTAL) BY MOUTH EVERY MORNING. Sulema Endo,  Abelino Able, FNP Taking Active   Cyanocobalamin  (VITAMIN B12 PO) [id number] Yes Take 1 tablet by mouth daily. 5,000 mcg  [provider] Taking Active Self  dorzolamide-timolol (COSOPT) 22.3-6.8 MG/ML ophthalmic solution [id number] Yes Place 1 drop into both eyes 2 (two) times daily. [provider] Taking Active   ferrous sulfate  325 (65 FE) MG tablet [id number] Yes TAKE 1 TABLET (325 MG TOTAL) BY MOUTH 2 (TWO) TIMES DAILY WITH A MEAL. Susanna Epley, FNP Taking Active   glucose blood [facility] ULTRA) test strip [id number] Yes USE TO CHECK BLOOD SUGAR ONCE A DAY AS INSTRUCTED Susanna Epley, FNP Taking Active   Lancets [facility] DELICA PLUS [place]) MISC [id number] Yes USE TO CHECK BLOOD SUGAR ONCE A DAY AS INSTRUCTED Susanna Epley, FNP Taking Active   metFORMIN  (GLUCOPHAGE ) 500 MG tablet [id number] Yes TAKE 1 TABLET (500 MG TOTAL) BY MOUTH DAILY WITH BREAKFAST. Susanna Epley, FNP Taking Active   Multiple Vitamins-Minerals (CENTRUM ADULT PO) [id number] Yes Take by mouth. [provider] Taking Active Self  omeprazole  (PRILOSEC) 40 MG capsule [id number] Yes 1 tablet BID for 8 weeks then QD Daina Drum, MD Taking Active   valsartan  (DIOVAN ) 160 MG tablet [id number] Yes TAKE 1 TABLET (160 MG TOTAL) BY MOUTH DAILY. Susanna Epley, FNP Taking Active   Vitamin D , Ergocalciferol , (DRISDOL ) 1.25 MG (50000 UNIT) CAPS capsule [id number] Yes TAKE 1 CAPSULE (50,000 UNITS TOTAL) BY MOUTH EVERY 7 (SEVEN) DAYS. Susanna Epley, FNP Taking Active             Recommendation:   Continue Current Plan of Care  Follow Up Plan:   Telephone follow-up 6-8 weeks  Alease Hunter, LCSW [place]  [facility], [facility] Clinical Social Worker Direct Dial: [phone]  Fax: [phone] Website: [email or web address] 11:00 AM

## 2024-02-12 ENCOUNTER — Ambulatory Visit: Admitting: Internal Medicine

## 2024-02-12 ENCOUNTER — Telehealth: Payer: Self-pay | Admitting: Pharmacist

## 2024-02-12 DIAGNOSIS — Z79899 Other long term (current) drug therapy: Secondary | ICD-10-CM

## 2024-02-12 DIAGNOSIS — E1159 Type 2 diabetes mellitus with other circulatory complications: Secondary | ICD-10-CM

## 2024-02-12 MED ORDER — VALSARTAN 160 MG PO TABS: 160.0000 mg | ORAL_TABLET | Freq: Every day | ORAL | 2 refills | Status: AC

## 2024-02-12 NOTE — Progress Notes (Addendum)
 Pharmacy Quality Measure Review  This patient is appearing on a report for being at risk of failing the adherence measure for hypertension (ACEi/ARB) medications this calendar year.   Medication: Valsartan  160 mg  Last fill date: 01/20/24 for 30 day supply  Prescription was originally sent to the Pharmacy for a 90 day supply but they did a 30 day supply.  Called pharmacy and they said a new prescription would have to be sent to them for them for fill a 90 day supply.  Her other chronic medications have been filled for 90 day supplies.  Last A1c 6.1%  Patient was also on the report for Atorvastatin  10 mg last filled 01/20/24 for a 90 day supply. Previous fills were for 30 day supplies  Plan; Send refill to Patient's Pharmacy with co-signature required to the PCP. Follow up to see if Patient is still on the report.   Geronimo Krabbe, PharmD, BCACP Clinical Pharmacist [phone]

## 2024-02-13 ENCOUNTER — Encounter (INDEPENDENT_AMBULATORY_CARE_PROVIDER_SITE_OTHER): Admitting: Ophthalmology

## 2024-02-18 ENCOUNTER — Encounter (INDEPENDENT_AMBULATORY_CARE_PROVIDER_SITE_OTHER): Admitting: Ophthalmology

## 2024-02-18 DIAGNOSIS — E113311 Type 2 diabetes mellitus with moderate nonproliferative diabetic retinopathy with macular edema, right eye: Secondary | ICD-10-CM

## 2024-02-18 DIAGNOSIS — H43813 Vitreous degeneration, bilateral: Secondary | ICD-10-CM

## 2024-02-18 DIAGNOSIS — H34812 Central retinal vein occlusion, left eye, with macular edema: Secondary | ICD-10-CM

## 2024-02-18 DIAGNOSIS — I10 Essential (primary) hypertension: Secondary | ICD-10-CM | POA: Diagnosis not present

## 2024-02-18 DIAGNOSIS — H35033 Hypertensive retinopathy, bilateral: Secondary | ICD-10-CM | POA: Diagnosis not present

## 2024-02-18 DIAGNOSIS — E113512 Type 2 diabetes mellitus with proliferative diabetic retinopathy with macular edema, left eye: Secondary | ICD-10-CM

## 2024-02-18 DIAGNOSIS — Z7984 Long term (current) use of oral hypoglycemic drugs: Secondary | ICD-10-CM | POA: Diagnosis not present

## 2024-03-17 ENCOUNTER — Other Ambulatory Visit: Payer: Self-pay | Admitting: Nurse Practitioner

## 2024-03-17 DIAGNOSIS — R7303 Prediabetes: Secondary | ICD-10-CM

## 2024-03-24 ENCOUNTER — Encounter (INDEPENDENT_AMBULATORY_CARE_PROVIDER_SITE_OTHER): Admitting: Ophthalmology

## 2024-03-31 ENCOUNTER — Encounter (INDEPENDENT_AMBULATORY_CARE_PROVIDER_SITE_OTHER): Admitting: Ophthalmology

## 2024-04-21 ENCOUNTER — Other Ambulatory Visit: Payer: Self-pay | Admitting: Nurse Practitioner

## 2024-04-21 ENCOUNTER — Ambulatory Visit (INDEPENDENT_AMBULATORY_CARE_PROVIDER_SITE_OTHER): Admitting: Internal Medicine

## 2024-04-21 ENCOUNTER — Ambulatory Visit: Payer: Self-pay | Admitting: Internal Medicine

## 2024-04-21 ENCOUNTER — Other Ambulatory Visit

## 2024-04-21 DIAGNOSIS — Z8619 Personal history of other infectious and parasitic diseases: Secondary | ICD-10-CM

## 2024-04-21 DIAGNOSIS — Z8719 Personal history of other diseases of the digestive system: Secondary | ICD-10-CM

## 2024-04-21 DIAGNOSIS — K649 Unspecified hemorrhoids: Secondary | ICD-10-CM

## 2024-04-21 DIAGNOSIS — K219 Gastro-esophageal reflux disease without esophagitis: Secondary | ICD-10-CM | POA: Diagnosis not present

## 2024-04-21 DIAGNOSIS — D509 Iron deficiency anemia, unspecified: Secondary | ICD-10-CM

## 2024-04-21 DIAGNOSIS — F3341 Major depressive disorder, recurrent, in partial remission: Secondary | ICD-10-CM

## 2024-04-21 DIAGNOSIS — K5900 Constipation, unspecified: Secondary | ICD-10-CM

## 2024-04-21 DIAGNOSIS — K222 Esophageal obstruction: Secondary | ICD-10-CM

## 2024-04-21 DIAGNOSIS — K209 Esophagitis, unspecified without bleeding: Secondary | ICD-10-CM

## 2024-04-21 DIAGNOSIS — Z720 Tobacco use: Secondary | ICD-10-CM

## 2024-04-21 LAB — CBC
HCT: 37.9 % (ref 36.0–46.0)
Hemoglobin: 11.9 g/dL — ABNORMAL LOW (ref 12.0–15.0)
MCHC: 31.5 g/dL (ref 30.0–36.0)
MCV: 75 fl — ABNORMAL LOW (ref 78.0–100.0)
Platelets: 266 K/uL (ref 150.0–400.0)
RBC: 5.06 Mil/uL (ref 3.87–5.11)
RDW: 18.5 % — ABNORMAL HIGH (ref 11.5–15.5)
WBC: 10 K/uL (ref 4.0–10.5)

## 2024-04-21 LAB — IBC + FERRITIN
Ferritin: 29.4 ng/mL (ref 10.0–291.0)
Iron: 59 ug/dL (ref 42–145)
Saturation Ratios: 17.9 % — ABNORMAL LOW (ref 20.0–50.0)
TIBC: 329 ug/dL (ref 250.0–450.0)
Transferrin: 235 mg/dL (ref 212.0–360.0)

## 2024-04-21 MED ORDER — POLYETHYLENE GLYCOL 3350 17 G PO PACK: 17.0000 g | PACK | Freq: Every day | ORAL | Status: DC

## 2024-04-21 MED ORDER — OMEPRAZOLE 40 MG PO CPDR: 40.0000 mg | DELAYED_RELEASE_CAPSULE | Freq: Every day | ORAL | 6 refills | Status: AC

## 2024-04-21 NOTE — Progress Notes (Signed)
 Chief Complaint: Constipation, reflux  HPI:    [name] is a 68 year old African-American female with history of diabetes and reflux presents for follow up of IDA, GERD, and constipation  Interval History: She thinks that she has a vaginal yeast infection. She has had a vaginal discharge. Denies redness or irritation.  Denies being sexually active recently. Denies recent antibiotics use. Denies blood in the stools. She has constipation on occasion. She has not been taking any laxatives. She does not think that she has been drinking enough water. She stopped smoking cigarettes 25 days ago. Denies dysphagia.   Past Medical History:  Diagnosis Date   Anemia 01/07/2020   REQUIRING TRANSFUSION   Back pain    Diabetes mellitus without complication (HCC)    GERD (gastroesophageal reflux disease)    Hypertension    Olecranon bursitis of left elbow    Sprain of left ankle 01/16/2023   Traumatic closed displaced fracture of shaft of right tibia and fibula 06/10/2016    Past Surgical History:  Procedure Laterality Date   ABDOMINAL HYSTERECTOMY     spared ovaries   OLECRANON BURSECTOMY Left 06/19/2017   Procedure: Excision olecranon bursa;  Surgeon: Yvone Rush, MD;  Location:  [facility];  Service: Orthopedics;  Laterality: Left;  Panel Length: 60 mins    TIBIA IM NAIL INSERTION Right 06/10/2016   Procedure: INTRAMEDULLARY (IM) NAIL TIBIAL RODING;  Surgeon: Rush Yvone, MD;  Location: [facility];  Service: Orthopedics;  Laterality: Right;    Current Outpatient Medications  Medication Sig Dispense Refill   amLODipine  (NORVASC ) 5 MG tablet TAKE 1 TABLET (5 MG TOTAL) BY MOUTH DAILY. 90 tablet 1   aspirin  EC 81 MG tablet Take 81 mg by mouth daily. Swallow whole.     atorvastatin  (LIPITOR) 10 MG tablet Take 1 tablet (10 mg total) by mouth daily. 90 tablet 1   buPROPion  (WELLBUTRIN  XL) 150 MG 24 hr tablet TAKE 1 TABLET (150 MG TOTAL) BY MOUTH EVERY MORNING. 30 tablet 2    Cyanocobalamin  (VITAMIN B12 PO) Take 1 tablet by mouth daily. 5,000 mcg     dorzolamide-timolol (COSOPT) 22.3-6.8 MG/ML ophthalmic solution Place 1 drop into both eyes 2 (two) times daily.     ferrous sulfate  325 (65 FE) MG tablet TAKE 1 TABLET (325 MG TOTAL) BY MOUTH 2 (TWO) TIMES DAILY WITH A MEAL. 60 tablet 3   glucose blood (ONETOUCH ULTRA) test strip USE TO CHECK BLOOD SUGAR ONCE A DAY AS INSTRUCTED 100 each 2   Lancets (ONETOUCH DELICA PLUS LANCET33G) MISC USE TO CHECK BLOOD SUGAR ONCE A DAY AS INSTRUCTED 100 each 2   metFORMIN  (GLUCOPHAGE ) 500 MG tablet TAKE 1 TABLET (500 MG TOTAL) BY MOUTH DAILY WITH BREAKFAST. 90 tablet 1   Multiple Vitamins-Minerals (CENTRUM ADULT PO) Take by mouth.     omeprazole  (PRILOSEC) 40 MG capsule 1 tablet BID for 8 weeks then QD 60 capsule 3   valsartan  (DIOVAN ) 160 MG tablet Take 1 tablet (160 mg total) by mouth daily. 90 tablet 2   Vitamin D , Ergocalciferol , (DRISDOL ) 1.25 MG (50000 UNIT) CAPS capsule TAKE 1 CAPSULE (50,000 UNITS TOTAL) BY MOUTH EVERY 7 (SEVEN) DAYS. 12 capsule 0   No current facility-administered medications for this visit.    Allergies as of 04/21/2024   (No Known Allergies)    Family History  Problem Relation Age of Onset   Diabetes Mother    Hypertension Mother    Diabetes Father    Hypertension Father  Breast cancer Sister    Breast cancer Sister    Breast cancer Paternal Aunt    Esophageal cancer Neg Hx    Liver disease Neg Hx    Colon cancer Neg Hx     Social History   Socioeconomic History   Marital status: Single    Spouse name: Not on file   Number of children: 0   Years of education: Not on file   Highest education level: Not on file  Occupational History   Occupation: retired  Tobacco Use   Smoking status: Every Day    Current packs/day: 0.50    Average packs/day: 0.5 packs/day for 30.0 years (15.0 ttl pk-yrs)    Types: Cigarettes   Smokeless tobacco: Never   Tobacco comments:    started back in  2012, smoking 1/2 PPD - 05/07/2022; 10/02/2023 - continues to try to quit  Vaping Use   Vaping status: Never Used  Substance and Sexual Activity   Alcohol use: No   Drug use: Yes    Frequency: 7.0 times per week    Types: Marijuana    Comment: yesterday   Sexual activity: Yes  Other Topics Concern   Not on file  Social History Narrative   Not on file   Social Drivers of Health   Financial Resource Strain: Low Risk  (10/02/2023)   Overall Financial Resource Strain (CARDIA)    Difficulty of Paying Living Expenses: Not hard at all  Food Insecurity: No Food Insecurity (02/11/2024)   Hunger Vital Sign    Worried About Running Out of Food in the Last Year: Never true    Ran Out of Food in the Last Year: Never true  Recent Concern: Food Insecurity - Food Insecurity Present (11/25/2023)   Hunger Vital Sign    Worried About Running Out of Food in the Last Year: Sometimes true    Ran Out of Food in the Last Year: Sometimes true  Transportation Needs: No Transportation Needs (02/11/2024)   PRAPARE - Administrator, Civil Service (Medical): No    Lack of Transportation (Non-Medical): No  Physical Activity: Insufficiently Active (10/02/2023)   Exercise Vital Sign    Days of Exercise per Week: 4 days    Minutes of Exercise per Session: 10 min  Stress: Stress Concern Present (10/02/2023)   [employer] of Occupational Health - Occupational Stress Questionnaire    Feeling of Stress : To some extent  Social Connections: Moderately Integrated (10/02/2023)   Social Connection and Isolation Panel    Frequency of Communication with Friends and Family: More than three times a week    Frequency of Social Gatherings with Friends and Family: More than three times a week    Attends Religious Services: More than 4 times per year    Active Member of [employer] or Organizations: Yes    Attends Engineer, structural: More than 4 times per year    Marital Status: Divorced  Intimate Partner  Violence: Not At Risk (02/11/2024)   Humiliation, Afraid, Rape, and Kick questionnaire    Fear of Current or Ex-Partner: No    Emotionally Abused: No    Physically Abused: No    Sexually Abused: No    Physical Exam:  Vital signs: BP 130/80   Pulse 60   Ht 5' 5 (1.651 m)   Wt 173 lb (78.5 kg)   BMI 28.79 kg/m    Constitutional:   Pleasant AA female appears to be in NAD, Well developed,  Well nourished, alert and cooperative Head:  Normocephalic and atraumatic. Respiratory: Respirations even and unlabored. Lungs clear to auscultation Cardiovascular: Regular rate Gastrointestinal:  Soft, nondistended, nontender Neurologic:  Alert and  oriented x4;  grossly normal neurologically.  Skin:   Dry and intact without significant lesions or rashes. Psychiatric: Demonstrates good judgement and reason without abnormal affect or behaviors.  RELEVANT LABS AND IMAGING: CBC    Component Value Date/Time   WBC 11.3 (H) 12/02/2023 1141   RBC 5.00 12/02/2023 1141   HGB 11.8 (L) 12/02/2023 1141   HGB 9.7 (L) 08/06/2023 1227   HGB 12.2 06/05/2023 1606   HGB 14.3 08/08/2007 1444   HCT 38.0 12/02/2023 1141   HCT 41.0 06/05/2023 1606   HCT 43.3 08/08/2007 1444   PLT 303.0 12/02/2023 1141   PLT 309 08/06/2023 1227   PLT 358 06/05/2023 1606   MCV 75.9 (L) 12/02/2023 1141   MCV 79 06/05/2023 1606   MCV 75.4 (L) 08/08/2007 1444   MCH 24.7 (L) 08/06/2023 1227   MCHC 31.2 12/02/2023 1141   RDW 17.1 (H) 12/02/2023 1141   RDW 15.0 06/05/2023 1606   RDW 17.0 (H) 08/08/2007 1444   LYMPHSABS 1.9 12/02/2023 1141   LYMPHSABS 1.8 06/05/2023 1606   LYMPHSABS 1.8 08/08/2007 1444   MONOABS 0.8 12/02/2023 1141   MONOABS 0.7 08/08/2007 1444   EOSABS 0.3 12/02/2023 1141   EOSABS 0.2 06/05/2023 1606   BASOSABS 0.2 (H) 12/02/2023 1141   BASOSABS 0.1 06/05/2023 1606   BASOSABS 0.0 08/08/2007 1444    CMP     Component Value Date/Time   NA 140 08/06/2023 1227   NA 139 06/05/2023 1606   K 4.2 08/06/2023  1227   CL 108 08/06/2023 1227   CO2 29 08/06/2023 1227   GLUCOSE 100 (H) 08/06/2023 1227   BUN 24 (H) 08/06/2023 1227   BUN 20 06/05/2023 1606   CREATININE 1.00 08/06/2023 1227   CALCIUM  9.0 08/06/2023 1227   PROT 7.0 08/06/2023 1227   PROT 6.9 06/05/2023 1606   ALBUMIN 3.8 08/06/2023 1227   ALBUMIN 4.0 06/05/2023 1606   AST 13 (L) 08/06/2023 1227   ALT 14 08/06/2023 1227   ALKPHOS 64 08/06/2023 1227   BILITOT 0.2 08/06/2023 1227   GFRNONAA >60 08/06/2023 1227   GFRAA 64 08/11/2020 1109   CT A/P w/contrast 06/21/23: IMPRESSION: 1. Bilateral nephrolithiasis. 2. Left adnexal lesion, possibly a cyst. Correlation with pelvic sonography should be considered.  Pelvic U/S 07/25/23: IMPRESSION: 1. Left adnexal 4.3 cm complex lesion versus an enlarged left ovary containing multiple cysts. Pelvic MRI is recommended further evaluation. 2.  Surgically absent uterus. 3.  Nonvisualization of the left ovary  EGD 07/01/23: - Diffuse, white plaques were found in the entire esophagus. Biopsies were taken with a cold forceps for histology. - One benign- appearing, intrinsic moderate ( circumferential scarring or stenosis; an endoscope may pass but only after pushing with resistance) stenosis was found in the proximal esophagus. This stenosis measured 5 cm ( in length) . The stenosis was traversed after pushing with resistance. Mucosal disruption was noted after endoscope passed through the stricture. - Localized severe inflammation characterized by congestion ( edema) , erosions, erythema, granularity and mucus was found in the cardia. Biopsies were taken of this area as well as the rest of the stomach with a cold forceps for histology. - Localized nodular mucosa was found in the duodenal bulb and in the second portion of the duodenum. Biopsies were taken with a cold  forceps for histology. - Localized mucosal variance characterized by congestion was found in the ampulla. Biopsies were taken with a cold  forceps for histology. - Biopsies for histology were taken with a cold forceps in the duodenal bulb and in the second portion of the duodenum for evaluation of celiac disease. Path:      1. Surgical [P], colon, ampulla/ polyps bx's :      - BENIGN SMALL BOWEL MUCOSA WITH CHRONIC INFLAMMATION AND FOVEOLAR METAPLASIA      CONSISTENT WITH NODULAR PEPTIC DUODENITIS      2. Surgical [P], nodular mucosa in duodenum bx's :      - BENIGN SMALL BOWEL MUCOSA WITH CHRONIC INFLAMMATION AND FOVEOLAR METAPLASIA      CONSISTENT WITH NODULAR PEPTIC DUODENITIS      3. Surgical [P], duodenal bx's :      - BENIGN SMALL BOWEL MUCOSA WITH NO SIGNIFICANT PATHOLOGIC CHANGES      4. Surgical [P], gastric bx's :      - H.PYLORI GASTRITIS, WITH IRON PILL FRAGMENTS PRESENT      - POSITIVE FOR H. PYLORI ON IMMUNOHISTOCHEMICAL STAIN- NEGATIVE FOR INTESTINAL      METAPLASIA, DYSPLASIA OR MALIGNANCY      5. Surgical [P], esophageal bx's :      - CANDIDA ESOPHAGITIS      - POSITIVE FOR FUNGAL ORGANISMS ON PAS STAIN      - NEGATIVE FOR DYSPLASIA OR MALIGNANCY   Colonoscopy 07/01/23: - The terminal ileum appeared normal. - A 15 mm polyp was found in the cecum. The polyp was sessile. The polyp was removed with a cold snare. Resection and retrieval were complete. - Three sessile polyps were found in the transverse colon. The polyps were 4 to 18 mm in size. These polyps were removed with a cold snare. Resection and retrieval were complete. - Multiple diverticula were found in the sigmoid colon. - Non- bleeding internal hemorrhoids were found during retroflexion. Path: 6. Surgical [P], colon, cecum polyp x1, transverse polyp x3, polyp (4) :       - TUBULAR ADENOMA(S).       - NO HIGH GRADE DYSPLASIA OR MALIGNANCY.       - SESSILE SERRATED POLYP(S).       - NO DYSPLASIA OR MALIGNANCY.   EGD 09/02/23: - One benign- appearing, intrinsic moderate ( circumferential scarring or stenosis; an endoscope may pass) stenosis was found  in the proximal esophagus. This stenosis measured 5 cm ( in length) . The stenosis was traversed. A guidewire was placed and the scope was withdrawn. Dilation was performed with a Savary dilator with mild resistance at 14 mm. The dilation site was examined following endoscope reinsertion and showed mild mucosal disruption. - Localized inflammation characterized by congestion ( edema) , erosions and erythema was found in the gastric antrum. Biopsies were taken with a cold forceps for histology. - The examined duodenum was normal. Path: 1. Surgical [P], gastric antrum and gastric body :       ANTRAL AND OXYNTIC MUCOSA WITH MILD CHRONIC INFLAMMATION.       IMMUNOHISTOCHEMISTRY FOR HELICOBACTER PYLORI IS NEGATIVE.   Assessment/Plan: IDA Constipation Hemorrhoid GERD History of H pylori infection  History of esophageal stricture Patient presents for follow-up of iron deficiency anemia.  Will plan to recheck her blood counts and iron levels today to see if this has improved.  If patient is still not responding to an oral iron supplement, I would consider stopping her oral iron  supplement and starting IV iron therapy.  She has been having some issues with constipation so I asked her to start taking MiraLAX  every day and to drink at least 8 cups of water daily.  Will continue her omeprazole  daily for now.  Patient was previously noted to have significant gastritis due to H. pylori, and this but subsequent gastric biopsies showed that her H. pylori infection has been eradicated.  Could potentially dose reduce her PPI in the future.  Patient did describe some issues with vaginal discharge today so I encouraged her to talk to her primary care provider about this issue. - Check CBC, ferritin/IBC - Continue oral iron supplement - Drink 8 cups of water per day - Start Miralax  every day - Continue omeprazole  40 mg every day. Refill. - Colonoscopy for polyp surveillance for 2 day prep next due in in 06/2026 -  Patient will need to see PCP [name] for vaginal discharge - RTC in 6 months  Estefana Kidney, MD [facility] 04/21/2024, 3:16 PM  I spent 31 minutes of time, including in depth chart review, independent review of results as outlined above, communicating results with the patient directly, face-to-face time with the patient, coordinating care, and ordering studies and medications as appropriate, and documentation.

## 2024-04-21 NOTE — Patient Instructions (Addendum)
 _______________________________________________________  If your blood pressure at your visit was 140/90 or greater, please contact your primary care physician to follow up on this.  _______________________________________________________  If you are age 68 or older, your body mass index should be between 23-30. Your Body mass index is 28.79 kg/m. If this is out of the aforementioned range listed, please consider follow up with your Primary Care Provider.  If you are age 35 or younger, your body mass index should be between 19-25. Your Body mass index is 28.79 kg/m. If this is out of the aformentioned range listed, please consider follow up with your Primary Care Provider.   ________________________________________________________  The [facility] providers would like to encourage you to use MYCHART to communicate with providers for non-urgent requests or questions.  Due to long hold times on the telephone, sending your provider a message by [facility] may be a faster and more efficient way to get a response.  Please allow 48 business hours for a response.  Please remember that this is for non-urgent requests.  _______________________________________________________  [facility] is using a team-based approach to care.  Your team is made up of your doctor and two to three APPS. Our APPS (Nurse Practitioners and Physician Assistants) work with your physician to ensure care continuity for you. They are fully qualified to address your health concerns and develop a treatment plan. They communicate directly with your gastroenterologist to care for you. Seeing the Advanced Practice Practitioners on your physician's team can help you by facilitating care more promptly, often allowing for earlier appointments, access to diagnostic testing, procedures, and other specialty referrals.   Your provider has requested that you go to the basement level for lab work before leaving today. Press B on the  elevator. The lab is located at the first door on the left as you exit the elevator.  Please purchase the following medications over the counter and take as directed:  START: Miralax  1 scoop daily  DRINK 8 cups of water daily  Please see [name], NP for vaginal discharge.  Due to recent changes in healthcare laws, you may see the results of your imaging and laboratory studies on MyChart before your provider has had a chance to review them.  We understand that in some cases there may be results that are confusing or concerning to you. Not all laboratory results come back in the same time frame and the provider may be waiting for multiple results in order to interpret others.  Please give us  48 hours in order for your provider to thoroughly review all the results before contacting the office for clarification of your results.   Thank you for entrusting me with your care and choosing [facility].  Dr Federico

## 2024-04-22 ENCOUNTER — Other Ambulatory Visit: Payer: Self-pay | Admitting: Internal Medicine

## 2024-04-22 ENCOUNTER — Encounter: Payer: Self-pay | Admitting: Internal Medicine

## 2024-04-22 ENCOUNTER — Encounter (INDEPENDENT_AMBULATORY_CARE_PROVIDER_SITE_OTHER): Admitting: Ophthalmology

## 2024-04-22 ENCOUNTER — Telehealth: Payer: Self-pay

## 2024-04-22 DIAGNOSIS — E113311 Type 2 diabetes mellitus with moderate nonproliferative diabetic retinopathy with macular edema, right eye: Secondary | ICD-10-CM

## 2024-04-22 DIAGNOSIS — Z7984 Long term (current) use of oral hypoglycemic drugs: Secondary | ICD-10-CM | POA: Diagnosis not present

## 2024-04-22 DIAGNOSIS — H43813 Vitreous degeneration, bilateral: Secondary | ICD-10-CM

## 2024-04-22 DIAGNOSIS — E113512 Type 2 diabetes mellitus with proliferative diabetic retinopathy with macular edema, left eye: Secondary | ICD-10-CM | POA: Diagnosis not present

## 2024-04-22 DIAGNOSIS — I10 Essential (primary) hypertension: Secondary | ICD-10-CM | POA: Diagnosis not present

## 2024-04-22 DIAGNOSIS — H35033 Hypertensive retinopathy, bilateral: Secondary | ICD-10-CM

## 2024-04-22 NOTE — Addendum Note (Signed)
 Addended by: Dewain Platz N on: 04/22/2024 12:08 PM   Modules accepted: Orders

## 2024-04-22 NOTE — Telephone Encounter (Signed)
 Dr. Federico, patient will be scheduled as soon as possible.  Auth Submission: NO AUTH NEEDED Site of care: Site of care: [facility] Payer: [employer] medicare Medication & CPT/J Code(s) submitted: Venofer  (Iron  Sucrose) J1756 Diagnosis Code:  Route of submission (phone, fax, portal):  Phone # Fax # Auth type: Buy/Bill PB Units/visits requested: 200mg  x 5 doses Reference number:  Approval from: 04/22/24 to 08/22/24

## 2024-04-23 DIAGNOSIS — H25813 Combined forms of age-related cataract, bilateral: Secondary | ICD-10-CM | POA: Diagnosis not present

## 2024-04-23 DIAGNOSIS — H40053 Ocular hypertension, bilateral: Secondary | ICD-10-CM | POA: Diagnosis not present

## 2024-04-28 ENCOUNTER — Telehealth: Payer: Self-pay | Admitting: Licensed Clinical Social Worker

## 2024-04-28 ENCOUNTER — Encounter: Payer: Self-pay | Admitting: Licensed Clinical Social Worker

## 2024-04-28 ENCOUNTER — Ambulatory Visit (INDEPENDENT_AMBULATORY_CARE_PROVIDER_SITE_OTHER)

## 2024-04-28 VITALS — BP 163/78 | HR 47 | Temp 99.2°F | Resp 14 | Ht 65.0 in | Wt 174.5 lb

## 2024-04-28 DIAGNOSIS — D509 Iron deficiency anemia, unspecified: Secondary | ICD-10-CM | POA: Diagnosis not present

## 2024-04-28 MED ORDER — IRON SUCROSE 20 MG/ML IV SOLN
200.0000 mg | Freq: Once | INTRAVENOUS | Status: AC
  Administered 2024-04-28: 200 mg via INTRAVENOUS
  Filled 2024-04-28: qty 10

## 2024-04-28 NOTE — Patient Instructions (Signed)
 Dorothe CHRISTELLA Dubin - I am sorry I was unable to reach you today for our scheduled appointment. I work with Georgina Speaks, FNP and am calling to support your healthcare needs. Please contact me at [phone] at your earliest convenience. I look forward to speaking with you soon.   Thank you,  Rolin Kerns, LCSW [place]  [facility], [facility] Clinical Social Worker Direct Dial: [phone]  Fax: [phone] Website: [email or web address] 4:07 PM'

## 2024-04-28 NOTE — Progress Notes (Signed)
 Diagnosis: Iron Deficiency Anemia  Provider:  Chilton Greathouse MD  Procedure: IV Push  IV Type: Peripheral, IV Location: L Antecubital  Venofer (Iron Sucrose), Dose: 200 mg  Post Infusion IV Care: Observation period completed and Peripheral IV Discontinued  Discharge: Condition: Good, Destination: Home . AVS Declined  Performed by:  Marlow Baars [place], RN

## 2024-04-30 ENCOUNTER — Ambulatory Visit (INDEPENDENT_AMBULATORY_CARE_PROVIDER_SITE_OTHER): Admitting: *Deleted

## 2024-04-30 VITALS — BP 138/76 | HR 54 | Temp 98.8°F | Resp 18 | Ht 65.0 in | Wt 174.8 lb

## 2024-04-30 DIAGNOSIS — D509 Iron deficiency anemia, unspecified: Secondary | ICD-10-CM

## 2024-04-30 MED ORDER — IRON SUCROSE 20 MG/ML IV SOLN
200.0000 mg | Freq: Once | INTRAVENOUS | Status: AC
  Administered 2024-04-30: 200 mg via INTRAVENOUS
  Filled 2024-04-30: qty 10

## 2024-04-30 NOTE — Progress Notes (Signed)
 Diagnosis: Iron  Deficiency Anemia  Provider:  Mannam, Praveen MD  Procedure: IV Push  IV Type: Peripheral, IV Location: L Antecubital  Venofer  (Iron  Sucrose), Dose: 200 mg  Post Infusion IV Care: Observation period completed  Discharge: Condition: Good, Destination: Home . AVS Declined  Performed by:  Mathew Therisa NOVAK, RN

## 2024-05-04 ENCOUNTER — Ambulatory Visit (INDEPENDENT_AMBULATORY_CARE_PROVIDER_SITE_OTHER)

## 2024-05-04 VITALS — BP 178/72 | HR 59 | Temp 98.0°F | Resp 18 | Ht 65.0 in | Wt 179.0 lb

## 2024-05-04 DIAGNOSIS — D509 Iron deficiency anemia, unspecified: Secondary | ICD-10-CM

## 2024-05-04 MED ORDER — IRON SUCROSE 20 MG/ML IV SOLN
200.0000 mg | Freq: Once | INTRAVENOUS | Status: AC
  Administered 2024-05-04: 200 mg via INTRAVENOUS
  Filled 2024-05-04: qty 10

## 2024-05-04 NOTE — Progress Notes (Signed)
 Diagnosis: Iron Deficiency Anemia  Provider:  Chilton Greathouse MD  Procedure: IV Push  IV Type: Peripheral, IV Location: R Hand  Venofer (Iron Sucrose), Dose: 200 mg  Post Infusion IV Care: Observation period completed and Peripheral IV Discontinued  Discharge: Condition: Good, Destination: Home . AVS Declined  Performed by:  Garnette Czech, RN

## 2024-05-06 ENCOUNTER — Ambulatory Visit (INDEPENDENT_AMBULATORY_CARE_PROVIDER_SITE_OTHER)

## 2024-05-06 VITALS — BP 126/78 | HR 83 | Temp 98.2°F | Resp 16 | Ht 65.0 in | Wt 182.4 lb

## 2024-05-06 DIAGNOSIS — D509 Iron deficiency anemia, unspecified: Secondary | ICD-10-CM

## 2024-05-06 MED ORDER — IRON SUCROSE 20 MG/ML IV SOLN
200.0000 mg | Freq: Once | INTRAVENOUS | Status: AC
  Administered 2024-05-06: 200 mg via INTRAVENOUS
  Filled 2024-05-06: qty 10

## 2024-05-06 MED ORDER — SODIUM CHLORIDE 0.9 % IV BOLUS
250.0000 mL | Freq: Once | INTRAVENOUS | Status: DC
  Filled 2024-05-06: qty 250

## 2024-05-06 NOTE — Progress Notes (Signed)
 Diagnosis: Iron  Deficiency Anemia  Provider:  Praveen Mannam MD  Procedure: IV Push  IV Type: Peripheral, IV Location: L Antecubital  Venofer  (Iron  Sucrose), Dose: 200 mg  Post Infusion IV Care: Observation period completed and Peripheral IV Discontinued  Discharge: Condition: Good, Destination: Home . AVS Declined  Performed by:  Maximiano JONELLE Pouch, LPN

## 2024-05-08 ENCOUNTER — Ambulatory Visit

## 2024-05-08 VITALS — BP 133/73 | HR 53 | Temp 97.4°F | Resp 18 | Ht 65.0 in | Wt 178.6 lb

## 2024-05-08 DIAGNOSIS — D509 Iron deficiency anemia, unspecified: Secondary | ICD-10-CM | POA: Diagnosis not present

## 2024-05-08 MED ORDER — SODIUM CHLORIDE 0.9 % IV BOLUS
250.0000 mL | Freq: Once | INTRAVENOUS | Status: DC
  Filled 2024-05-08: qty 250

## 2024-05-08 MED ORDER — IRON SUCROSE 20 MG/ML IV SOLN
200.0000 mg | Freq: Once | INTRAVENOUS | Status: AC
  Administered 2024-05-08: 200 mg via INTRAVENOUS
  Filled 2024-05-08: qty 10

## 2024-05-08 NOTE — Progress Notes (Signed)
 Diagnosis: Iron  Deficiency Anemia  Provider:  Praveen Mannam MD  Procedure: IV Push  IV Type: Peripheral, IV Location: L Antecubital  Venofer  (Iron  Sucrose), Dose: 200 mg  Post Infusion IV Care: Observation period completed  Discharge: Condition: Good, Destination: Home . AVS Provided  Performed by:  Rachelle Bue, RN

## 2024-05-10 IMAGING — CT CT CHEST LUNG CANCER SCREENING LOW DOSE W/O CM
1 series · 10 of 10 positions shown, 13 images · non-contrast
Comparison: None Available.

CLINICAL DATA: Baseline exam. Current asymptomatic smoker with 37
pack-year history.



[ct lung segmentation data · axial · 0.72mm/px · z∈[-350,-350]mm · 10 of 333 frames shown]
[frame 1/333  mediastinal]
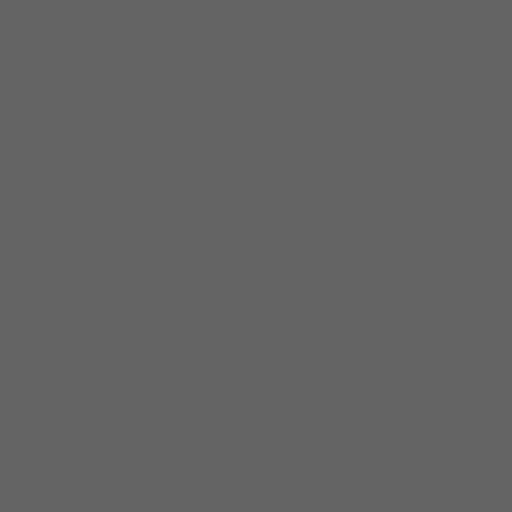
[frame 1/333  lung]
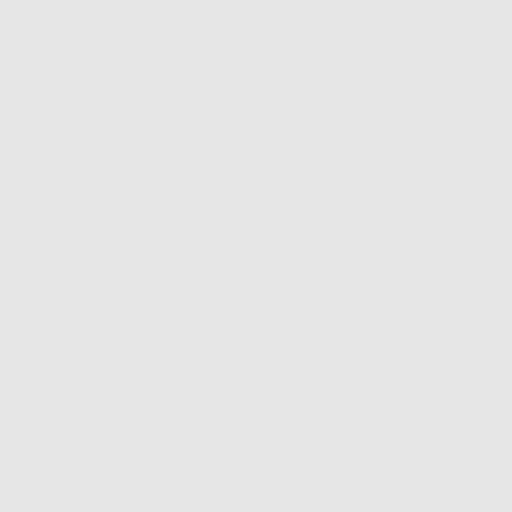
[frame 37/333  lung]
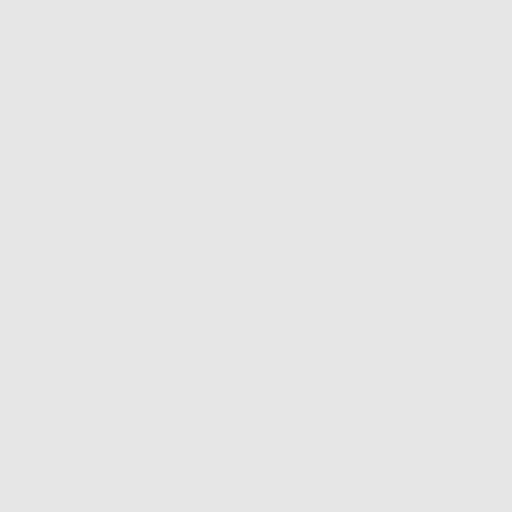
[frame 74/333  lung]
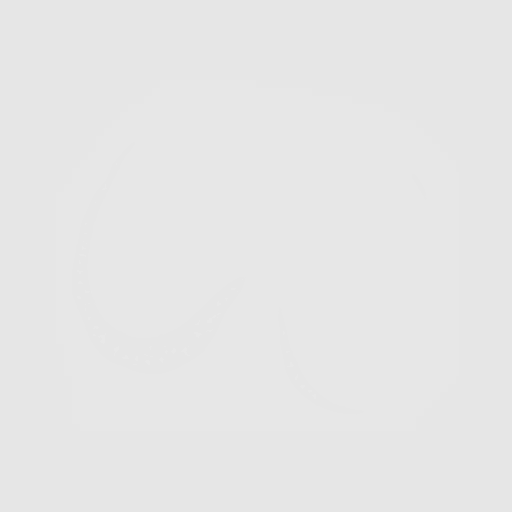
[frame 111/333  lung]
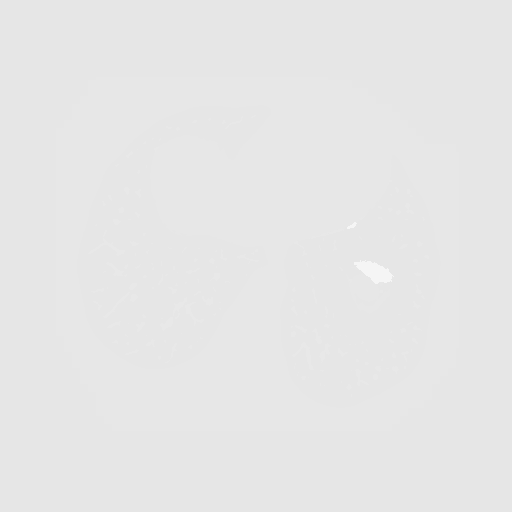
[frame 148/333  mediastinal]
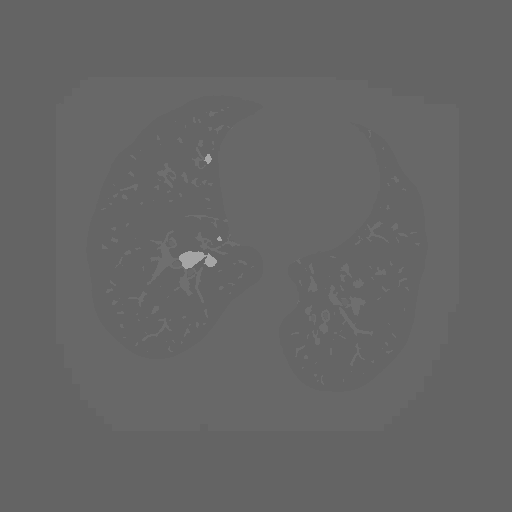
[frame 148/333  lung]
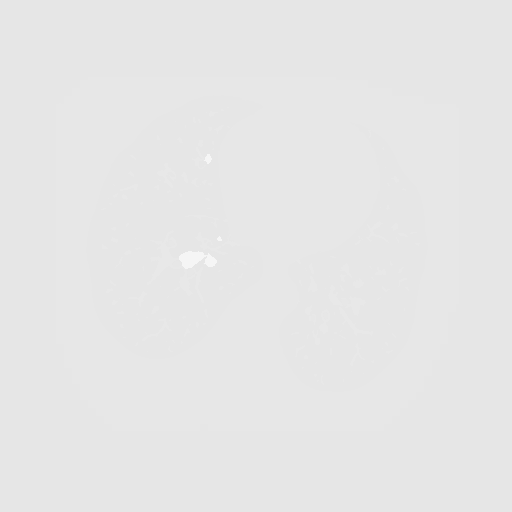
[frame 185/333  lung]
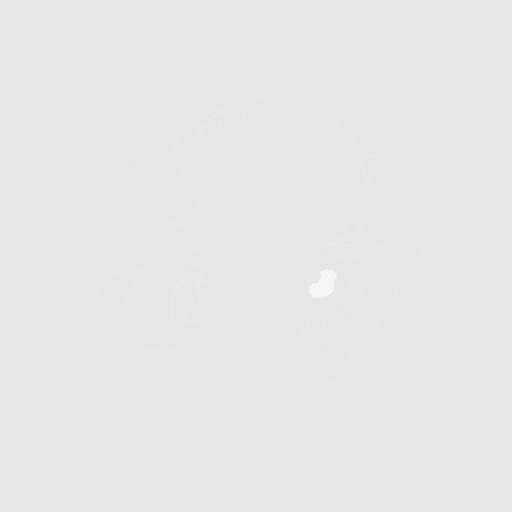
[frame 222/333  lung]
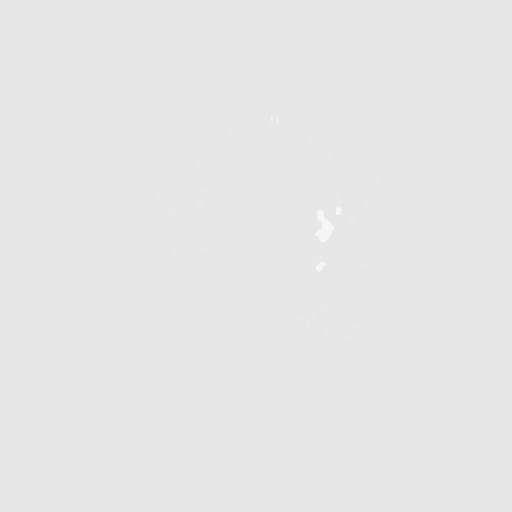
[frame 259/333  lung]
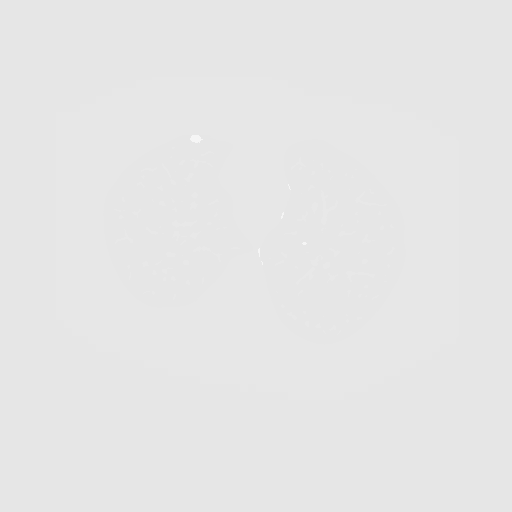
[frame 296/333  mediastinal]
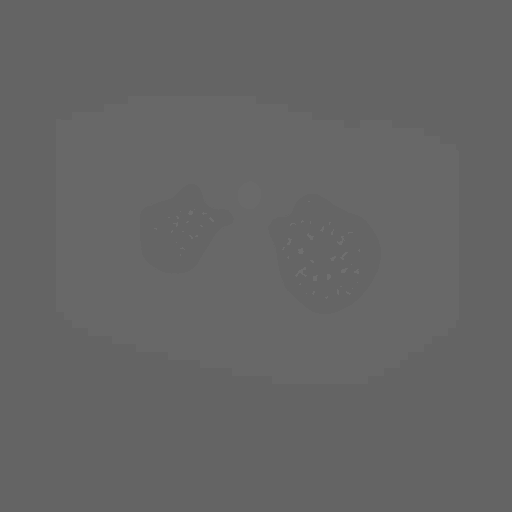
[frame 296/333  lung]
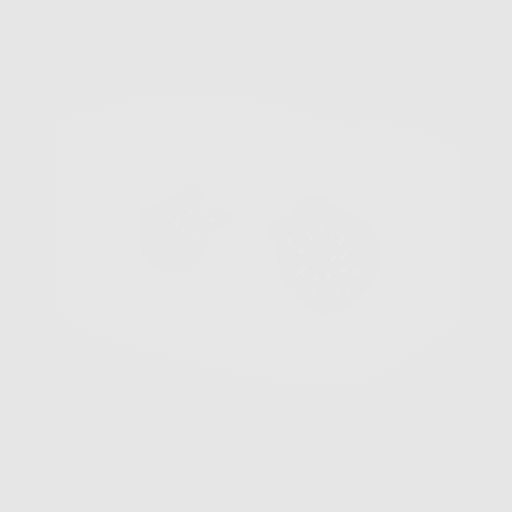
[frame 333/333  lung]
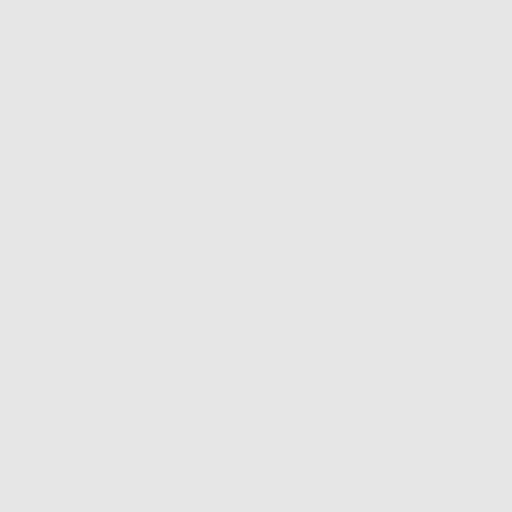

[10 of 10 positions shown; findings below may reference images not displayed]

FINDINGS: Cardiovascular: Normal heart size. Aortic atherosclerosis and LAD
coronary artery calcification.

Mediastinum/Nodes: No enlarged mediastinal, hilar, or axillary lymph
nodes. Thyroid gland, trachea, and esophagus demonstrate no
significant findings.

Lungs/Pleura: Mild emphysema. No pleural effusion or airspace
consolidation. No pulmonary nodules or mass identified.

Upper Abdomen: No acute abnormality. Bilateral kidney stones
identified measuring up to 5 mm.

Musculoskeletal: No chest wall mass or suspicious bone lesions
identified.
IMPRESSION: 1. Lung-RADS 1, negative. Continue annual screening with low-dose
chest CT without contrast in 12 months.
2. Bilateral nephrolithiasis.
3. Aortic Atherosclerosis (25Q56-BGL.L) and Emphysema (25Q56-AJD.Z).

## 2024-05-19 ENCOUNTER — Other Ambulatory Visit: Payer: Self-pay | Admitting: Licensed Clinical Social Worker

## 2024-05-19 NOTE — Patient Instructions (Signed)
 Visit Information  Thank you for taking time to visit with me today. Please don't hesitate to contact me if I can be of assistance to you before our next scheduled appointment.  Your next care management appointment is by telephone on 10/07 at 10:30 AM  Please call the care guide team at [phone] if you need to cancel, schedule, or reschedule an appointment.   Please call the Suicide and Crisis Lifeline: 988 go to [facility] [address] [place] [phone]) call 911 if you are experiencing a Mental Health or Behavioral Health Crisis or need someone to talk to.  Rolin Kerns, LCSW [place]  [facility], [facility] Clinical Social Worker Direct Dial: [phone]  Fax: [phone] Website: [email or web address] 11:35 AM

## 2024-05-19 NOTE — Patient Outreach (Signed)
 Complex Care Management   Visit Note  05/19/2024  Name:  [name] MRN: [id number] DOB: 1956-08-12  Situation: Referral received for Complex Care Management related to Mental/Behavioral Health diagnosis Depression/Stress I obtained verbal consent from Patient.  Visit completed with Patient  on the phone  Background:   Past Medical History:  Diagnosis Date   Anemia 01/07/2020   REQUIRING TRANSFUSION   Back pain    Diabetes mellitus without complication (HCC)    GERD (gastroesophageal reflux disease)    Hypertension    Olecranon bursitis of left elbow    Sprain of left ankle 01/16/2023   Traumatic closed displaced fracture of shaft of right tibia and fibula 06/10/2016    Assessment: Patient Reported Symptoms:  Cognitive Cognitive Status: No symptoms reported, Alert and oriented to person, place, and time, Normal speech and language skills Cognitive/Intellectual Conditions Management [RPT]: None reported or documented in medical history or problem list   Health Maintenance Behaviors: Annual physical exam  Neurological Neurological Review of Symptoms: No symptoms reported    HEENT HEENT Symptoms Reported: No symptoms reported      Cardiovascular Cardiovascular Symptoms Reported: No symptoms reported    Respiratory Respiratory Symptoms Reported: No symptoms reported    Endocrine Endocrine Symptoms Reported: No symptoms reported Is patient diabetic?: Yes    Gastrointestinal Gastrointestinal Symptoms Reported: No symptoms reported      Genitourinary Genitourinary Symptoms Reported: No symptoms reported    Integumentary Integumentary Symptoms Reported: No symptoms reported    Musculoskeletal Musculoskelatal Symptoms Reviewed: No symptoms reported, Limited mobility Additional Musculoskeletal Details: Pt continues to utilize rollater. No concerns noted Musculoskeletal Management Strategies: Routine screening Falls in the past year?: No Number of falls in past year: 1  or less Was there an injury with Fall?: No Fall Risk Category Calculator: 0 Patient Fall Risk Level: Low Fall Risk    Psychosocial Psychosocial Symptoms Reported: Other Other Psychosocial Conditions: Stress Additional Psychological Details: Patient is concerned about eye health, has an upcoming appt this week to discuss clearance for cataract surgery Behavioral Management Strategies: Adequate rest, Abstinence from substances, Support system, Coping strategies, Medication therapy Major Change/Loss/Stressor/Fears (CP): Medical condition, self Techniques to Cope with Loss/Stress/Change: Diversional activities, Spiritual practice(s), Medication      05/19/2024    PHQ2-9 Depression Screening   Little interest or pleasure in doing things    Feeling down, depressed, or hopeless    PHQ-2 - Total Score    Trouble falling or staying asleep, or sleeping too much    Feeling tired or having little energy    Poor appetite or overeating     Feeling bad about yourself - or that you are a failure or have let yourself or your family down    Trouble concentrating on things, such as reading the newspaper or watching television    Moving or speaking so slowly that other people could have noticed.  Or the opposite - being so fidgety or restless that you have been moving around a lot more than usual    Thoughts that you would be better off dead, or hurting yourself in some way    PHQ2-9 Total Score    If you checked off any problems, how difficult have these problems made it for you to do your work, take care of things at home, or get along with other people    Depression Interventions/Treatment      There were no vitals filed for this visit.  Medications Reviewed Today  Reviewed by Ezzard Rolin BIRCH, LCSW (Social Worker) on 05/19/24 at 1017  Med List Status: <None>   Medication Order Taking? Sig Documenting Provider Last Dose Status Informant  amLODipine  (NORVASC ) 5 MG tablet [id number]  TAKE 1 TABLET  (5 MG TOTAL) BY MOUTH DAILY. Georgina Speaks, FNP  Active   aspirin  EC 81 MG tablet [id number]  Take 81 mg by mouth daily. Swallow whole. [provider]  Active Self  atorvastatin  (LIPITOR) 10 MG tablet [id number]  Take 1 tablet (10 mg total) by mouth daily. Georgina Speaks, FNP  Active   buPROPion  (WELLBUTRIN  XL) 150 MG 24 hr tablet [id number]  TAKE 1 TABLET (150 MG TOTAL) BY MOUTH EVERY MORNING. Georgina Speaks, FNP  Active   Cyanocobalamin  (VITAMIN B12 PO) [id number]  Take 1 tablet by mouth daily. 5,000 mcg [provider]  Active Self  dorzolamide-timolol (COSOPT) 22.3-6.8 MG/ML ophthalmic solution [id number]  Place 1 drop into both eyes 2 (two) times daily. [provider]  Active   ferrous sulfate  325 (65 FE) MG tablet [id number]  TAKE 1 TABLET (325 MG TOTAL) BY MOUTH 2 (TWO) TIMES DAILY WITH A MEAL. Georgina Speaks, FNP  Active   glucose blood [facility] ULTRA) test strip [id number]  USE TO CHECK BLOOD SUGAR ONCE A DAY AS INSTRUCTED Georgina Speaks, FNP  Active   Lancets [facility] DELICA PLUS [place]) MISC [id number]  USE TO CHECK BLOOD SUGAR ONCE A DAY AS INSTRUCTED Georgina Speaks, FNP  Active   metFORMIN  (GLUCOPHAGE ) 500 MG tablet [id number]  TAKE 1 TABLET (500 MG TOTAL) BY MOUTH DAILY WITH BREAKFAST. Georgina Speaks, FNP  Active   Multiple Vitamins-Minerals (CENTRUM ADULT PO) [id number]  Take by mouth. [provider]  Active Self  omeprazole  (PRILOSEC) 40 MG capsule [id number]  Take 1 capsule (40 mg total) by mouth daily. Federico Rosario BROCKS, MD  Active   polyethylene glycol (MIRALAX  / GLYCOLAX ) 17 g packet [id number]  Take 17 g by mouth daily. Federico Rosario BROCKS, MD  Active   valsartan  (DIOVAN ) 160 MG tablet [id number]  Take 1 tablet (160 mg total) by mouth daily. Georgina Speaks, FNP  Active   Vitamin D , Ergocalciferol , (DRISDOL ) 1.25 MG (50000 UNIT) CAPS capsule [id number]  TAKE 1 CAPSULE (50,000 UNITS TOTAL) BY MOUTH EVERY 7 (SEVEN) DAYS. Georgina Speaks, FNP  Active              Recommendation:   Continue Current Plan of Care  Follow Up Plan:   Telephone follow-up in 1 month  Rolin Ezzard, LCSW [facility]  [facility], [facility] Clinical Social Worker Direct Dial: [phone]  Fax: [phone] Website: [email or web address] 11:34 AM

## 2024-05-21 ENCOUNTER — Encounter (INDEPENDENT_AMBULATORY_CARE_PROVIDER_SITE_OTHER): Admitting: Ophthalmology

## 2024-05-21 DIAGNOSIS — E113512 Type 2 diabetes mellitus with proliferative diabetic retinopathy with macular edema, left eye: Secondary | ICD-10-CM | POA: Diagnosis not present

## 2024-05-21 DIAGNOSIS — I10 Essential (primary) hypertension: Secondary | ICD-10-CM

## 2024-05-21 DIAGNOSIS — Z7984 Long term (current) use of oral hypoglycemic drugs: Secondary | ICD-10-CM | POA: Diagnosis not present

## 2024-05-21 DIAGNOSIS — H35033 Hypertensive retinopathy, bilateral: Secondary | ICD-10-CM | POA: Diagnosis not present

## 2024-05-21 DIAGNOSIS — E113311 Type 2 diabetes mellitus with moderate nonproliferative diabetic retinopathy with macular edema, right eye: Secondary | ICD-10-CM | POA: Diagnosis not present

## 2024-05-21 DIAGNOSIS — H2513 Age-related nuclear cataract, bilateral: Secondary | ICD-10-CM | POA: Diagnosis not present

## 2024-05-21 DIAGNOSIS — H43813 Vitreous degeneration, bilateral: Secondary | ICD-10-CM | POA: Diagnosis not present

## 2024-06-02 ENCOUNTER — Other Ambulatory Visit: Payer: Self-pay | Admitting: Nurse Practitioner

## 2024-06-02 DIAGNOSIS — E559 Vitamin D deficiency, unspecified: Secondary | ICD-10-CM

## 2024-06-02 DIAGNOSIS — I10 Essential (primary) hypertension: Secondary | ICD-10-CM

## 2024-06-04 ENCOUNTER — Telehealth: Payer: Self-pay

## 2024-06-04 NOTE — Telephone Encounter (Signed)
 Copied from CRM [id number]. Topic: Referral - Request for Referral >> Jun 04, 2024  3:21 PM Darshell M wrote: Did the patient discuss referral with their provider in the last year? No (If No - schedule appointment) (If Yes - send message)  Appointment offered? No  Type of order/referral and detailed reason for visit: Patient is requesting a social work referral for financial assistance with medical bills and to apply for Medicaid.  Preference of office, provider, location: No   If referral order, have you been seen by this specialty before? No (If Yes, this issue or another issue? When? Where?   Can we respond through MyChart? Yes   ----------------------------------------------------------------------- From previous Reason for Contact - Other: Reason for CRM:     PT [facility] FOR APPT.

## 2024-06-11 DIAGNOSIS — H40053 Ocular hypertension, bilateral: Secondary | ICD-10-CM | POA: Diagnosis not present

## 2024-06-11 DIAGNOSIS — H25813 Combined forms of age-related cataract, bilateral: Secondary | ICD-10-CM | POA: Diagnosis not present

## 2024-06-12 ENCOUNTER — Telehealth: Payer: Self-pay | Admitting: Pharmacist

## 2024-06-12 DIAGNOSIS — R7303 Prediabetes: Secondary | ICD-10-CM

## 2024-06-12 MED ORDER — METFORMIN HCL 500 MG PO TABS: 500.0000 mg | ORAL_TABLET | Freq: Every day | ORAL | 1 refills | Status: DC

## 2024-06-12 NOTE — Progress Notes (Signed)
   06/12/2024  Patient ID: [name], female   DOB: 06/14/1956, 68 y.o.   MRN: [id number]  Pharmacy Quality Measure Review  This patient is appearing on a report for being at risk of failing the adherence measure for diabetes medications this calendar year.   Medication: Metformin  500 mg Last fill date: 03/17/2024 for 90 day supply  Will collaborate with provider to facilitate refill needs.  Cassius DOROTHA Brought, PharmD, BCACP Clinical Pharmacist [phone]  Script sent cosignature required.  Upcoming appt 06/24/24  refill due date: 06/14/2024  Needs labs. Last A1c 5.5 (01/25)   Cassius DOROTHA Brought, PharmD, [facility] Clinical Pharmacist [phone]

## 2024-06-16 ENCOUNTER — Telehealth: Admitting: Licensed Clinical Social Worker

## 2024-06-19 DIAGNOSIS — H25812 Combined forms of age-related cataract, left eye: Secondary | ICD-10-CM | POA: Diagnosis not present

## 2024-06-23 ENCOUNTER — Ambulatory Visit (INDEPENDENT_AMBULATORY_CARE_PROVIDER_SITE_OTHER): Payer: Self-pay | Admitting: Nurse Practitioner

## 2024-06-23 ENCOUNTER — Encounter: Payer: Self-pay | Admitting: Nurse Practitioner

## 2024-06-23 ENCOUNTER — Encounter (INDEPENDENT_AMBULATORY_CARE_PROVIDER_SITE_OTHER): Admitting: Ophthalmology

## 2024-06-23 VITALS — BP 120/80 | HR 60 | Temp 98.9°F | Ht 65.0 in | Wt 175.0 lb

## 2024-06-23 DIAGNOSIS — Z2821 Immunization not carried out because of patient refusal: Secondary | ICD-10-CM

## 2024-06-23 DIAGNOSIS — I119 Hypertensive heart disease without heart failure: Secondary | ICD-10-CM

## 2024-06-23 DIAGNOSIS — E559 Vitamin D deficiency, unspecified: Secondary | ICD-10-CM | POA: Diagnosis not present

## 2024-06-23 DIAGNOSIS — F5101 Primary insomnia: Secondary | ICD-10-CM | POA: Diagnosis not present

## 2024-06-23 DIAGNOSIS — N898 Other specified noninflammatory disorders of vagina: Secondary | ICD-10-CM | POA: Diagnosis not present

## 2024-06-23 DIAGNOSIS — Z6829 Body mass index (BMI) 29.0-29.9, adult: Secondary | ICD-10-CM | POA: Diagnosis not present

## 2024-06-23 DIAGNOSIS — I700 Atherosclerosis of aorta: Secondary | ICD-10-CM

## 2024-06-23 DIAGNOSIS — E1159 Type 2 diabetes mellitus with other circulatory complications: Secondary | ICD-10-CM | POA: Diagnosis not present

## 2024-06-23 DIAGNOSIS — J0110 Acute frontal sinusitis, unspecified: Secondary | ICD-10-CM

## 2024-06-23 DIAGNOSIS — E663 Overweight: Secondary | ICD-10-CM

## 2024-06-23 DIAGNOSIS — Z87891 Personal history of nicotine dependence: Secondary | ICD-10-CM

## 2024-06-23 MED ORDER — DOXEPIN HCL 3 MG PO TABS: 1.0000 | ORAL_TABLET | Freq: Every evening | ORAL | 3 refills | Status: AC | PRN

## 2024-06-23 MED ORDER — PREDNISONE 5 MG (21) PO TBPK: ORAL_TABLET | ORAL | 0 refills | Status: DC

## 2024-06-23 NOTE — Progress Notes (Signed)
 LILLETTE Kristeen JINNY Gladis, CMA,acting as a Neurosurgeon for [name], FNP.,have documented all relevant documentation on the behalf of [name], FNP,as directed by  [name], FNP while in the presence of [name], FNP.  Subjective:  Patient ID: [name] , female    DOB: [birth year] , 68 y.o.   MRN: [id number]  Chief Complaint  Patient presents with   Insomnia    Patient presents today for trouble sleeping, headache for about 1 month. Patient reports she is unable to sleep.    Hypertension    Patient presents today for a bp and dm follow up, Patient reports compliance with medication. Patient denies any chest pain, SOB, or headaches.     HPI  Discussed the use of AI scribe software for clinical note transcription with the patient, who gave verbal consent to proceed.  History of Present Illness [name] is a 68 year old female with type two diabetes who presents with a throbbing headache and nasal symptoms.  She has been experiencing a throbbing headache for the past month, originating around her nose and radiating to the back of her head. The pain intensifies when reading, possibly due to her eyes. No nasal congestion is reported, and she has not taken any medication for these symptoms.  She has type two diabetes and is currently taking metformin  to manage her blood sugar levels. She is concerned about her A1c levels.  She suspects a yeast infection, noting a previous prescription for a 10-day course of pills from another doctor following a colonoscopy. She is currently using an over-the-counter three-day treatment for a yeast infection, with one dose remaining.  She has been taking iron  pills following iron  transfusions and has reduced her dosage to one pill. She also uses melatonin gummies (5 mg each) for sleep issues, taking two gummies at night, but finds them ineffective.  She quit smoking and using marijuana four months ago, which she emphasizes as a significant  lifestyle change. She also mentions a history of back issues, which she attributes to heredity, and notes some improvement in her condition since receiving iron  infusions.   Past Medical History:  Diagnosis Date   Anemia 01/07/2020   REQUIRING TRANSFUSION   Back pain    Diabetes mellitus without complication (HCC)    GERD (gastroesophageal reflux disease)    Hypertension    Olecranon bursitis of left elbow    Sprain of left ankle 01/16/2023   Traumatic closed displaced fracture of shaft of right tibia and fibula 06/10/2016     Family History  Problem Relation Age of Onset   Diabetes Mother    Hypertension Mother    Diabetes Father    Hypertension Father    Breast cancer Sister    Breast cancer Sister    Breast cancer Paternal Aunt    Esophageal cancer Neg Hx    Liver disease Neg Hx    Colon cancer Neg Hx      Current Outpatient Medications:    amLODipine  (NORVASC ) 5 MG tablet, TAKE 1 TABLET (5 MG TOTAL) BY MOUTH DAILY., Disp: 90 tablet, Rfl: 1   aspirin  EC 81 MG tablet, Take 81 mg by mouth daily. Swallow whole., Disp: , Rfl:    atorvastatin  (LIPITOR) 10 MG tablet, Take 1 tablet (10 mg total) by mouth daily., Disp: 90 tablet, Rfl: 1   buPROPion  (WELLBUTRIN  XL) 150 MG 24 hr tablet, TAKE 1 TABLET (150 MG TOTAL) BY MOUTH EVERY MORNING., Disp: 30 tablet, Rfl: 2  Cyanocobalamin  (VITAMIN B12 PO), Take 1 tablet by mouth daily. 5,000 mcg, Disp: , Rfl:    dorzolamide-timolol (COSOPT) 22.3-6.8 MG/ML ophthalmic solution, Place 1 drop into both eyes 2 (two) times daily., Disp: , Rfl:    Doxepin HCl 3 MG TABS, Take 1 tablet (3 mg total) by mouth at bedtime as needed., Disp: 30 tablet, Rfl: 3   ferrous sulfate  325 (65 FE) MG tablet, TAKE 1 TABLET (325 MG TOTAL) BY MOUTH 2 (TWO) TIMES DAILY WITH A MEAL., Disp: 60 tablet, Rfl: 3   glucose blood (ONETOUCH ULTRA) test strip, USE TO CHECK BLOOD SUGAR ONCE A DAY AS INSTRUCTED, Disp: 100 each, Rfl: 2   Lancets (ONETOUCH DELICA PLUS LANCET33G)  MISC, USE TO CHECK BLOOD SUGAR ONCE A DAY AS INSTRUCTED, Disp: 100 each, Rfl: 2   metFORMIN  (GLUCOPHAGE ) 500 MG tablet, Take 1 tablet (500 mg total) by mouth daily with breakfast., Disp: 90 tablet, Rfl: 1   Multiple Vitamins-Minerals (CENTRUM ADULT PO), Take by mouth., Disp: , Rfl:    omeprazole  (PRILOSEC) 40 MG capsule, Take 1 capsule (40 mg total) by mouth daily., Disp: 30 capsule, Rfl: 6   polyethylene glycol (MIRALAX  / GLYCOLAX ) 17 g packet, Take 17 g by mouth daily., Disp: , Rfl:    predniSONE (STERAPRED UNI-PAK 21 TAB) 5 MG (21) TBPK tablet, Take as directed, Disp: 21 tablet, Rfl: 0   valsartan  (DIOVAN ) 160 MG tablet, Take 1 tablet (160 mg total) by mouth daily., Disp: 90 tablet, Rfl: 2   Vitamin D , Ergocalciferol , (DRISDOL ) 1.25 MG (50000 UNIT) CAPS capsule, TAKE 1 CAPSULE (50,000 UNITS TOTAL) BY MOUTH EVERY 7 (SEVEN) DAYS., Disp: 12 capsule, Rfl: 0   No Known Allergies   Review of Systems  Constitutional:  Negative for fatigue.  Cardiovascular:  Negative for chest pain, palpitations and leg swelling.  Endocrine: Negative for polydipsia, polyphagia and polyuria.  Neurological:  Negative for dizziness and headaches.  Psychiatric/Behavioral: Negative.       Today's Vitals   06/23/24 1612  BP: 120/80  Pulse: 60  Temp: 98.9 F (37.2 C)  TempSrc: Oral  Weight: 175 lb (79.4 kg)  Height: 5' 5 (1.651 m)  PainSc: 8   PainLoc: Head   Body mass index is 29.12 kg/m.  Wt Readings from Last 3 Encounters:  06/23/24 175 lb (79.4 kg)  05/08/24 178 lb 9.6 oz (81 kg)  05/06/24 182 lb 6.4 oz (82.7 kg)     Objective:  Physical Exam Vitals and nursing note reviewed.  Constitutional:      General: She is not in acute distress.    Appearance: Normal appearance.  Cardiovascular:     Rate and Rhythm: Normal rate and regular rhythm.     Pulses: Normal pulses.     Heart sounds: Normal heart sounds. No murmur heard. Pulmonary:     Effort: Pulmonary effort is normal. No respiratory  distress.     Breath sounds: Normal breath sounds. No wheezing.  Musculoskeletal:     Comments: Rollator walker  Skin:    General: Skin is warm and dry.     Capillary Refill: Capillary refill takes less than 2 seconds.  Neurological:     General: No focal deficit present.     Mental Status: She is alert and oriented to person, place, and time.     Cranial Nerves: No cranial nerve deficit.     Motor: No weakness.  Psychiatric:        Mood and Affect: Mood normal.  Behavior: Behavior normal.        Thought Content: Thought content normal.        Judgment: Judgment normal.     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 - Order A1c test.  Orders: -     BMP8+eGFR -     Hemoglobin A1c  Vaginal discharge Assessment & Plan: Discharge and odor suggest possible yeast infection. - Instructed to perform self-swab for yeast infection.  Orders: -     NuSwab Vaginitis Plus (VG+)  Influenza vaccination declined  Herpes zoster vaccination declined  Overweight with body mass index (BMI) of 29 to 29.9 in adult  Primary insomnia Assessment & Plan: Difficulty sleeping despite melatonin use, open to other treatments. - Increase melatonin to 10 mg at bedtime. - Follow up in six weeks to assess insomnia treatment.  Orders: -     Doxepin HCl; Take 1 tablet (3 mg total) by mouth at bedtime as needed.  Dispense: 30 tablet; Refill: 3  Vitamin D  deficiency Assessment & Plan: Will check vitamin D  level and supplement as needed.    Also encouraged to spend 15 minutes in the sun daily.    Orders: -     VITAMIN D  25 Hydroxy (Vit-D Deficiency, Fractures)  Hypertensive heart disease without heart failure Assessment & Plan: Blood pressure reported as good, managed with valsartan  and amlodipine .   Atherosclerosis of aorta  Acute non-recurrent frontal  sinusitis Assessment & Plan: Throbbing head sensation and nasal discharge suggest sinusitis. - Prescribed medication: 6 tablets on day one, decrease by one tablet daily. Start in the morning.  Orders: -     predniSONE; Take as directed  Dispense: 21 tablet; Refill: 0  Quit smoking within past year Assessment & Plan: Abstinent from smoking and marijuana for four months, committed to maintaining abstinence.     Return for keep same next.  Patient was given opportunity to ask questions. Patient verbalized understanding of the plan and was able to repeat key elements of the plan. All questions were answered to their satisfaction.    LILLETTE [name], FNP, have reviewed all documentation for this visit. The documentation on 06/23/24 for the exam, diagnosis, procedures, and orders are all accurate and complete.   IF YOU HAVE BEEN REFERRED TO A SPECIALIST, IT MAY TAKE 1-2 WEEKS TO SCHEDULE/PROCESS THE REFERRAL. IF YOU HAVE NOT HEARD FROM US /SPECIALIST IN TWO WEEKS, PLEASE GIVE US  A CALL AT [phone].

## 2024-06-24 ENCOUNTER — Ambulatory Visit: Payer: Self-pay | Admitting: Nurse Practitioner

## 2024-06-24 LAB — HEMOGLOBIN A1C
Est. average glucose Bld gHb Est-mCnc: 146 mg/dL
Hgb A1c MFr Bld: 6.7 % — ABNORMAL HIGH (ref 4.8–5.6)

## 2024-06-24 LAB — VITAMIN D 25 HYDROXY (VIT D DEFICIENCY, FRACTURES): Vit D, 25-Hydroxy: 46.5 ng/mL (ref 30.0–100.0)

## 2024-06-24 LAB — BMP8+EGFR
BUN/Creatinine Ratio: 24 (ref 12–28)
BUN: 28 mg/dL — ABNORMAL HIGH (ref 8–27)
CO2: 17 mmol/L — ABNORMAL LOW (ref 20–29)
Calcium: 9.2 mg/dL (ref 8.7–10.3)
Chloride: 104 mmol/L (ref 96–106)
Creatinine, Ser: 1.18 mg/dL — ABNORMAL HIGH (ref 0.57–1.00)
Glucose: 103 mg/dL — ABNORMAL HIGH (ref 70–99)
Potassium: 4.3 mmol/L (ref 3.5–5.2)
Sodium: 140 mmol/L (ref 134–144)
eGFR: 50 mL/min/1.73 — ABNORMAL LOW (ref >59)

## 2024-06-25 ENCOUNTER — Other Ambulatory Visit: Payer: Self-pay | Admitting: Licensed Clinical Social Worker

## 2024-06-25 LAB — NUSWAB VAGINITIS PLUS (VG+)
Candida albicans, NAA: NEGATIVE
Candida glabrata, NAA: NEGATIVE
Chlamydia trachomatis, NAA: NEGATIVE
Neisseria gonorrhoeae, NAA: NEGATIVE
Trich vag by NAA: NEGATIVE

## 2024-06-25 NOTE — Patient Instructions (Signed)
 Visit Information  Thank you for taking time to visit with me today. Please don't hesitate to contact me if I can be of assistance to you before our next scheduled appointment.  Your next care management appointment is by telephone on 11/20 at 11:30 AM  Please call the care guide team at [phone] if you need to cancel, schedule, or reschedule an appointment.   Please call the Suicide and Crisis Lifeline: 988 go to [facility] [address], [place] [phone]) call 911 if you are experiencing a Mental Health or Behavioral Health Crisis or need someone to talk to.  Rolin Kerns, LCSW [place]  [place], [facility] Clinical Social Worker Direct Dial: [phone]  Fax: [phone] Website: [email or web address] 12:30 PM

## 2024-06-25 NOTE — Patient Outreach (Signed)
 Complex Care Management   Visit Note  06/25/2024  Name:  [name] MRN: [id number] DOB: 1956-04-13  Situation: Referral received for Complex Care Management related to Stress I obtained verbal consent from Patient.  Visit completed with Patient  on the phone  Background:   Past Medical History:  Diagnosis Date   Anemia 01/07/2020   REQUIRING TRANSFUSION   Back pain    Diabetes mellitus without complication (HCC)    GERD (gastroesophageal reflux disease)    Hypertension    Olecranon bursitis of left elbow    Sprain of left ankle 01/16/2023   Traumatic closed displaced fracture of shaft of right tibia and fibula 06/10/2016    Assessment: Patient Reported Symptoms:  Cognitive Cognitive Status: No symptoms reported      Neurological Neurological Review of Symptoms: Headaches Neurological Management Strategies: Adequate rest, Coping strategies, Medication therapy, Routine screening Neurological Comment: Pt started her 5 day dose of medication today. Agreed to schedule f/up with PCP  HEENT HEENT Symptoms Reported: Other: HEENT Management Strategies: Coping strategies, Routine screening HEENT Comment: Pt had cataract surgery on left eye, has f/up appt scheduled in a couple of weeks. Upcoming cataract surgery scheduled for right eye on 12/12    Cardiovascular Cardiovascular Symptoms Reported: Not assessed    Respiratory Respiratory Symptoms Reported: Not assesed    Endocrine Endocrine Symptoms Reported: Not assessed    Gastrointestinal Gastrointestinal Symptoms Reported: Not assessed      Genitourinary Genitourinary Symptoms Reported: Not assessed    Integumentary Integumentary Symptoms Reported: Not assessed    Musculoskeletal Musculoskelatal Symptoms Reviewed: Back pain Musculoskeletal Management Strategies: Coping strategies, Routine screening, Medical device      Psychosocial Psychosocial Symptoms Reported: Other Other Psychosocial Conditions:  Stress Additional Psychological Details: Pt endorses increase in pain due to Headaches, has addressed with PCP and started meds today. Behavioral Management Strategies: Adequate rest, Coping strategies, Medication therapy, Support system Major Change/Loss/Stressor/Fears (CP): Medical condition, self Techniques to Cope with Loss/Stress/Change: Diversional activities, Spiritual practice(s), Medication      06/25/2024    PHQ2-9 Depression Screening   Little interest or pleasure in doing things    Feeling down, depressed, or hopeless    PHQ-2 - Total Score    Trouble falling or staying asleep, or sleeping too much    Feeling tired or having little energy    Poor appetite or overeating     Feeling bad about yourself - or that you are a failure or have let yourself or your family down    Trouble concentrating on things, such as reading the newspaper or watching television    Moving or speaking so slowly that other people could have noticed.  Or the opposite - being so fidgety or restless that you have been moving around a lot more than usual    Thoughts that you would be better off dead, or hurting yourself in some way    PHQ2-9 Total Score    If you checked off any problems, how difficult have these problems made it for you to do your work, take care of things at home, or get along with other people    Depression Interventions/Treatment      There were no vitals filed for this visit.  Medications Reviewed Today     Reviewed by Ezzard Rolin BIRCH, LCSW (Social Worker) on 06/25/24 at 1153  Med List Status: <None>   Medication Order Taking? Sig Documenting Provider Last Dose Status Informant  amLODipine  (NORVASC ) 5 MG tablet [id number]  TAKE 1 TABLET (5 MG TOTAL) BY MOUTH DAILY. Georgina Speaks, FNP  Active   aspirin  EC 81 MG tablet [id number]  Take 81 mg by mouth daily. Swallow whole. [provider]  Active Self  atorvastatin  (LIPITOR) 10 MG tablet [id number]  Take 1 tablet (10 mg  total) by mouth daily. Georgina Speaks, FNP  Active   buPROPion  (WELLBUTRIN  XL) 150 MG 24 hr tablet [id number]  TAKE 1 TABLET (150 MG TOTAL) BY MOUTH EVERY MORNING. Georgina Speaks, FNP  Active   Cyanocobalamin  (VITAMIN B12 PO) [id number]  Take 1 tablet by mouth daily. 5,000 mcg [provider]  Active Self  dorzolamide-timolol (COSOPT) 22.3-6.8 MG/ML ophthalmic solution [id number]  Place 1 drop into both eyes 2 (two) times daily. [provider]  Active   Doxepin HCl 3 MG TABS [id number]  Take 1 tablet (3 mg total) by mouth at bedtime as needed. Georgina Speaks, FNP  Active   ferrous sulfate  325 (65 FE) MG tablet [id number]  TAKE 1 TABLET (325 MG TOTAL) BY MOUTH 2 (TWO) TIMES DAILY WITH A MEAL. Georgina Speaks, FNP  Active   glucose blood [facility] ULTRA) test strip [id number]  USE TO CHECK BLOOD SUGAR ONCE A DAY AS INSTRUCTED Georgina Speaks, FNP  Active   Lancets [facility] DELICA PLUS [place]) MISC [id number]  USE TO CHECK BLOOD SUGAR ONCE A DAY AS INSTRUCTED Georgina Speaks, FNP  Active   metFORMIN  (GLUCOPHAGE ) 500 MG tablet [id number]  Take 1 tablet (500 mg total) by mouth daily with breakfast. Georgina Speaks, FNP  Active   Multiple Vitamins-Minerals (CENTRUM ADULT PO) [id number]  Take by mouth. [provider]  Active Self  omeprazole  (PRILOSEC) 40 MG capsule [id number]  Take 1 capsule (40 mg total) by mouth daily. Federico Rosario BROCKS, MD  Active   polyethylene glycol (MIRALAX  / GLYCOLAX ) 17 g packet [id number]  Take 17 g by mouth daily. Federico Rosario BROCKS, MD  Active   predniSONE (STERAPRED UNI-PAK 21 TAB) 5 MG (21) TBPK tablet [id number]  Take as directed Georgina Speaks, FNP  Active   valsartan  (DIOVAN ) 160 MG tablet [id number]  Take 1 tablet (160 mg total) by mouth daily. Georgina Speaks, FNP  Active   Vitamin D , Ergocalciferol , (DRISDOL ) 1.25 MG (50000 UNIT) CAPS capsule [id number]  TAKE 1 CAPSULE (50,000 UNITS TOTAL) BY MOUTH EVERY 7 (SEVEN) DAYS. Georgina Speaks, FNP  Active              Recommendation:   Continue Current Plan of Care  Follow Up Plan:   Telephone follow-up in 1 month  Rolin Kerns, LCSW [facility]  [facility], [facility] Clinical Social Worker Direct Dial: [phone]  Fax: [phone] Website: [email or web address] 12:30 PM

## 2024-06-29 ENCOUNTER — Encounter (INDEPENDENT_AMBULATORY_CARE_PROVIDER_SITE_OTHER): Admitting: Ophthalmology

## 2024-06-30 ENCOUNTER — Ambulatory Visit: Payer: Self-pay | Admitting: Nurse Practitioner

## 2024-06-30 DIAGNOSIS — J0110 Acute frontal sinusitis, unspecified: Secondary | ICD-10-CM | POA: Insufficient documentation

## 2024-06-30 DIAGNOSIS — Z87891 Personal history of nicotine dependence: Secondary | ICD-10-CM | POA: Insufficient documentation

## 2024-06-30 NOTE — Assessment & Plan Note (Signed)
 Abstinent from smoking and marijuana for four months, committed to maintaining abstinence.

## 2024-06-30 NOTE — Assessment & Plan Note (Signed)
 On metformin , A1c test planned to assess glycemic control. - Order A1c test.

## 2024-06-30 NOTE — Assessment & Plan Note (Signed)
 Blood pressure reported as good, managed with valsartan  and amlodipine .

## 2024-06-30 NOTE — Assessment & Plan Note (Signed)
 Discharge and odor suggest possible yeast infection. - Instructed to perform self-swab for yeast infection.

## 2024-06-30 NOTE — Assessment & Plan Note (Addendum)
 Difficulty sleeping despite melatonin use, open to other treatments. - Increase melatonin to 10 mg at bedtime. - Follow up in six weeks to assess insomnia treatment.

## 2024-06-30 NOTE — Assessment & Plan Note (Signed)
 Will check vitamin D  level and supplement as needed.    Also encouraged to spend 15 minutes in the sun daily.

## 2024-06-30 NOTE — Assessment & Plan Note (Signed)
 Throbbing head sensation and nasal discharge suggest sinusitis. - Prescribed medication: 6 tablets on day one, decrease by one tablet daily. Start in the morning.

## 2024-07-03 ENCOUNTER — Ambulatory Visit: Payer: Self-pay

## 2024-07-03 ENCOUNTER — Encounter (INDEPENDENT_AMBULATORY_CARE_PROVIDER_SITE_OTHER): Admitting: Ophthalmology

## 2024-07-03 ENCOUNTER — Other Ambulatory Visit: Payer: Self-pay | Admitting: Nurse Practitioner

## 2024-07-03 DIAGNOSIS — H2511 Age-related nuclear cataract, right eye: Secondary | ICD-10-CM

## 2024-07-03 DIAGNOSIS — H43813 Vitreous degeneration, bilateral: Secondary | ICD-10-CM | POA: Diagnosis not present

## 2024-07-03 DIAGNOSIS — E113512 Type 2 diabetes mellitus with proliferative diabetic retinopathy with macular edema, left eye: Secondary | ICD-10-CM

## 2024-07-03 DIAGNOSIS — H35033 Hypertensive retinopathy, bilateral: Secondary | ICD-10-CM | POA: Diagnosis not present

## 2024-07-03 DIAGNOSIS — I10 Essential (primary) hypertension: Secondary | ICD-10-CM | POA: Diagnosis not present

## 2024-07-03 DIAGNOSIS — Z7984 Long term (current) use of oral hypoglycemic drugs: Secondary | ICD-10-CM

## 2024-07-03 DIAGNOSIS — H34812 Central retinal vein occlusion, left eye, with macular edema: Secondary | ICD-10-CM

## 2024-07-03 DIAGNOSIS — H59032 Cystoid macular edema following cataract surgery, left eye: Secondary | ICD-10-CM | POA: Diagnosis not present

## 2024-07-03 DIAGNOSIS — E113311 Type 2 diabetes mellitus with moderate nonproliferative diabetic retinopathy with macular edema, right eye: Secondary | ICD-10-CM

## 2024-07-03 DIAGNOSIS — I2510 Atherosclerotic heart disease of native coronary artery without angina pectoris: Secondary | ICD-10-CM

## 2024-07-03 NOTE — Telephone Encounter (Signed)
 Patient reports severe generalized headache for the last month. Patient states pain is 8 out of 10 currently. Patient reports shakes and generally doesn't feel well. Patient is requesting to be seen today. Recommended to patient that the ED would be the optimal place as no appointments available in the office. Patient verbalized understanding and states she will be going to the ED.  FYI Only or Action Required?: FYI only for provider.  Patient was last seen in primary care on 06/23/2024 by Georgina Speaks, FNP.  Called Nurse Triage reporting Headache.  Symptoms began about a month ago.  Interventions attempted: Rest, hydration, or home remedies.  Symptoms are: gradually worsening.  Triage Disposition: Go to ED Now (Notify PCP)  Patient/caregiver understands and will follow disposition?: Yes  Copied from CRM [id number]. Topic: Clinical - Red Word Triage >> Jul 03, 2024  2:56 PM Roselie BROCKS wrote: Red Word that prompted transfer to Nurse Triage: Patient states she is having the shakes, and bad painful headaches,  A doctor sent her medication previously , but patient states it did not help all. Reason for Disposition  [1] SEVERE headache (e.g., excruciating) AND [2] worst headache of life  Answer Assessment - Initial Assessment Questions 1. LOCATION: Where does it hurt?      Generalized pain to head 2. ONSET: When did the headache start? (e.g., minutes, hours, days)      Started over a month 3. PATTERN: Does the pain come and go, or has it been constant since it started?     constant 4. SEVERITY: How bad is the pain? and What does it keep you from doing?  (e.g., Scale 1-10; mild, moderate, or severe)      8 out of 10 5. RECURRENT SYMPTOM: Have you ever had headaches before? If Yes, ask: When was the last time? and What happened that time?     yes 6. CAUSE: What do you think is causing the headache?     unsure 7. MIGRAINE: Have you been diagnosed with migraine  headaches? If Yes, ask: Is this headache similar?      no 8. HEAD INJURY: Has there been any recent injury to your head?      no 9. OTHER SYMPTOMS: Do you have any other symptoms? (e.g., fever, stiff neck, eye pain, sore throat, cold symptoms)     Shakes, eye pain  Protocols used: Headache-A-AH

## 2024-07-06 ENCOUNTER — Encounter: Payer: Self-pay | Admitting: Nurse Practitioner

## 2024-07-06 ENCOUNTER — Ambulatory Visit: Admitting: Nurse Practitioner

## 2024-07-06 VITALS — BP 100/60 | HR 60 | Temp 98.5°F | Ht 65.0 in | Wt 179.6 lb

## 2024-07-06 DIAGNOSIS — R519 Headache, unspecified: Secondary | ICD-10-CM

## 2024-07-06 DIAGNOSIS — E1159 Type 2 diabetes mellitus with other circulatory complications: Secondary | ICD-10-CM | POA: Diagnosis not present

## 2024-07-06 MED ORDER — UBRELVY 50 MG PO TABS: 1.0000 | ORAL_TABLET | Freq: Every day | ORAL | 2 refills | Status: DC | PRN

## 2024-07-06 MED ORDER — KETOROLAC TROMETHAMINE 60 MG/2ML IM SOLN
60.0000 mg | Freq: Once | INTRAMUSCULAR | Status: AC
  Administered 2024-07-06: 60 mg via INTRAMUSCULAR

## 2024-07-06 NOTE — Progress Notes (Unsigned)
 LILLETTE Kristeen JINNY Gladis, CMA,acting as a neurosurgeon for Kristin Ada, FNP.,have documented all relevant documentation on the behalf of Kristin Ada, FNP,as directed by  Kristin Ada, FNP while in the presence of Kristin Ada, FNP.  Subjective:  Patient ID: [name] , female    DOB: [birth year] , 68 y.o.   MRN: [id number]  Chief Complaint  Patient presents with  . Headache    Patient presents today for then shakes and a headache for the past month. Patient reports the zpack didn't work. She reports her body feels like she is rushing. She reports her headaches typically last all day everyday.    HPI  HPI   Past Medical History:  Diagnosis Date  . Anemia 01/07/2020   REQUIRING TRANSFUSION  . Back pain   . Diabetes mellitus without complication (HCC)   . GERD (gastroesophageal reflux disease)   . Hypertension   . Olecranon bursitis of left elbow   . Sprain of left ankle 01/16/2023  . Traumatic closed displaced fracture of shaft of right tibia and fibula 06/10/2016     Family History  Problem Relation Age of Onset  . Diabetes Mother   . Hypertension Mother   . Diabetes Father   . Hypertension Father   . Breast cancer Sister   . Breast cancer Sister   . Breast cancer Paternal Aunt   . Esophageal cancer Neg Hx   . Liver disease Neg Hx   . Colon cancer Neg Hx      Current Outpatient Medications:  .  amLODipine  (NORVASC ) 5 MG tablet, TAKE 1 TABLET (5 MG TOTAL) BY MOUTH DAILY., Disp: 90 tablet, Rfl: 1 .  aspirin  EC 81 MG tablet, Take 81 mg by mouth daily. Swallow whole., Disp: , Rfl:  .  atorvastatin  (LIPITOR) 10 MG tablet, TAKE 1 TABLET (10 MG TOTAL) BY MOUTH DAILY FOR CHOLESTEROL, Disp: 90 tablet, Rfl: 1 .  buPROPion  (WELLBUTRIN  XL) 150 MG 24 hr tablet, TAKE 1 TABLET (150 MG TOTAL) BY MOUTH EVERY MORNING., Disp: 30 tablet, Rfl: 2 .  Cyanocobalamin  (VITAMIN B12 PO), Take 1 tablet by mouth daily. 5,000 mcg, Disp: , Rfl:  .  dorzolamide-timolol (COSOPT) 22.3-6.8 MG/ML ophthalmic  solution, Place 1 drop into both eyes 2 (two) times daily., Disp: , Rfl:  .  Doxepin HCl 3 MG TABS, Take 1 tablet (3 mg total) by mouth at bedtime as needed., Disp: 30 tablet, Rfl: 3 .  FEROSUL 325 (65 Fe) MG tablet, TAKE 1 TABLET (325 MG TOTAL) BY MOUTH 2 (TWO) TIMES DAILY WITH A MEAL., Disp: 60 tablet, Rfl: 3 .  glucose blood (ONETOUCH ULTRA) test strip, USE TO CHECK BLOOD SUGAR ONCE A DAY AS INSTRUCTED, Disp: 100 each, Rfl: 2 .  Lancets (ONETOUCH DELICA PLUS LANCET33G) MISC, USE TO CHECK BLOOD SUGAR ONCE A DAY AS INSTRUCTED, Disp: 100 each, Rfl: 2 .  metFORMIN  (GLUCOPHAGE ) 500 MG tablet, Take 1 tablet (500 mg total) by mouth daily with breakfast., Disp: 90 tablet, Rfl: 1 .  Multiple Vitamins-Minerals (CENTRUM ADULT PO), Take by mouth., Disp: , Rfl:  .  omeprazole  (PRILOSEC) 40 MG capsule, Take 1 capsule (40 mg total) by mouth daily., Disp: 30 capsule, Rfl: 6 .  polyethylene glycol (MIRALAX  / GLYCOLAX ) 17 g packet, Take 17 g by mouth daily., Disp: , Rfl:  .  Ubrogepant (UBRELVY) 50 MG TABS, Take 1 tablet (50 mg total) by mouth daily as needed., Disp: 16 tablet, Rfl: 2 .  valsartan  (DIOVAN ) 160 MG tablet, Take 1  tablet (160 mg total) by mouth daily., Disp: 90 tablet, Rfl: 2 .  Vitamin D , Ergocalciferol , (DRISDOL ) 1.25 MG (50000 UNIT) CAPS capsule, TAKE 1 CAPSULE (50,000 UNITS TOTAL) BY MOUTH EVERY 7 (SEVEN) DAYS., Disp: 12 capsule, Rfl: 0  Current Facility-Administered Medications:  .  ketorolac  (TORADOL ) injection 60 mg, 60 mg, Intramuscular, Once,    No Known Allergies   Review of Systems  Constitutional: Negative.   Respiratory: Negative.    Cardiovascular: Negative.   Neurological:  Positive for headaches.  Psychiatric/Behavioral: Negative.       Today's Vitals   07/06/24 1155  BP: 100/60  Pulse: 60  Temp: 98.5 F (36.9 C)  TempSrc: Oral  Weight: 179 lb 9.6 oz (81.5 kg)  Height: 5' 5 (1.651 m)  PainSc: 9   PainLoc: Head   Body mass index is 29.89 kg/m.  Wt Readings from  Last 3 Encounters:  07/06/24 179 lb 9.6 oz (81.5 kg)  06/23/24 175 lb (79.4 kg)  05/08/24 178 lb 9.6 oz (81 kg)     Objective:  Physical Exam Vitals and nursing note reviewed.  Constitutional:      General: She is not in acute distress.    Appearance: Normal appearance.  Cardiovascular:     Rate and Rhythm: Normal rate and regular rhythm.     Pulses: Normal pulses.     Heart sounds: Normal heart sounds. No murmur heard. Pulmonary:     Effort: Pulmonary effort is normal. No respiratory distress.     Breath sounds: Normal breath sounds. No wheezing.  Musculoskeletal:     Comments: Rollator walker  Skin:    General: Skin is warm and dry.     Capillary Refill: Capillary refill takes less than 2 seconds.  Neurological:     General: No focal deficit present.     Mental Status: She is alert and oriented to person, place, and time.     Cranial Nerves: No cranial nerve deficit.     Motor: No weakness.  Psychiatric:        Mood and Affect: Mood normal.        Behavior: Behavior normal.        Thought Content: Thought content normal.        Judgment: Judgment normal.     Assessment And Plan:  Nonintractable headache, unspecified chronicity pattern, unspecified headache type -     CT HEAD WO CONTRAST ( ); Future -     Ketorolac  Tromethamine  -     [place]; Take 1 tablet (50 mg total) by mouth daily as needed.  Dispense: 16 tablet; Refill: 2    Return for keep same next.  Patient was given opportunity to ask questions. Patient verbalized understanding of the plan and was able to repeat key elements of the plan. All questions were answered to their satisfaction.    LILLETTE Kristin Ada, FNP, have reviewed all documentation for this visit. The documentation on 07/06/24 for the exam, diagnosis, procedures, and orders are all accurate and complete.   IF YOU HAVE BEEN REFERRED TO A SPECIALIST, IT MAY TAKE 1-2 WEEKS TO SCHEDULE/PROCESS THE REFERRAL. IF YOU HAVE NOT HEARD FROM US /SPECIALIST  IN TWO WEEKS, PLEASE GIVE US  A CALL AT [phone].

## 2024-07-07 ENCOUNTER — Telehealth: Payer: Self-pay

## 2024-07-07 NOTE — Telephone Encounter (Signed)
 Copied from CRM [id number]. Topic: Clinical - Medication Question >> Jul 07, 2024  2:40 PM Everette C wrote: Reason for CRM: The patient has called back, as directed by their PCP, to share that they are continuing to experience their headaches and would like to discuss a potential increase in the strength of their prescription for Ubrogepant (UBRELVY) 50 MG TABS [[id number]]. Please contact the patient further when possible   Spoke with patient she was encouraged to pick up samples of Ubrelvy 100mg  - patient was also encouraged to go to ER for a CT scan.

## 2024-07-09 DIAGNOSIS — R519 Headache, unspecified: Secondary | ICD-10-CM | POA: Insufficient documentation

## 2024-07-09 NOTE — Assessment & Plan Note (Signed)
 A1c increased to 6.7%. Discussed Ozempic for glycemic control. Explained side effects and addressed concerns. Patient interested in starting Ozempic. - Initiate Ozempic 0.25 mg weekly for four weeks, then 0.5 mg weekly. Educate on administration and side effects. - Advise dietary modifications: increase vegetables, fruits, proteins; decrease carbohydrates. - Encourage hydration: 64 ounces of water daily. - Instruct to report abdominal pain, swallowing difficulties, vision changes.

## 2024-07-09 NOTE — Assessment & Plan Note (Signed)
 New daily headache, severe, persistent for two weeks. Insomnia secondary to headache. No improvement with acetaminophen  or ibuprofen. Previous prednisone and antibiotics without relief - Order CT scan of the head. - Administer Toradol  injection - Prescribe Ubrelvy 50 mg at headache onset, repeat in two hours if needed. Monitor response.

## 2024-07-10 ENCOUNTER — Telehealth: Payer: Self-pay

## 2024-07-10 NOTE — Telephone Encounter (Signed)
 PA for ubrelvy sent to plan.

## 2024-07-13 ENCOUNTER — Ambulatory Visit
Admission: RE | Admit: 2024-07-13 | Discharge: 2024-07-13 | Disposition: A | Source: Ambulatory Visit | Attending: Nurse Practitioner | Admitting: Nurse Practitioner

## 2024-07-13 DIAGNOSIS — R519 Headache, unspecified: Secondary | ICD-10-CM

## 2024-07-15 ENCOUNTER — Encounter (HOSPITAL_COMMUNITY): Payer: Self-pay | Admitting: Radiology

## 2024-07-15 ENCOUNTER — Emergency Department (HOSPITAL_COMMUNITY)
Admission: EM | Admit: 2024-07-15 | Discharge: 2024-07-16 | Disposition: A | Discharge status: Home / Self Care | Attending: Emergency Medicine | Admitting: Emergency Medicine

## 2024-07-15 ENCOUNTER — Other Ambulatory Visit: Payer: Self-pay | Admitting: Nurse Practitioner

## 2024-07-15 ENCOUNTER — Emergency Department (HOSPITAL_COMMUNITY)

## 2024-07-15 ENCOUNTER — Other Ambulatory Visit: Payer: Self-pay

## 2024-07-15 ENCOUNTER — Ambulatory Visit: Payer: Self-pay | Admitting: Nurse Practitioner

## 2024-07-15 DIAGNOSIS — R42 Dizziness and giddiness: Secondary | ICD-10-CM | POA: Diagnosis not present

## 2024-07-15 DIAGNOSIS — Z7982 Long term (current) use of aspirin: Secondary | ICD-10-CM | POA: Diagnosis not present

## 2024-07-15 DIAGNOSIS — E119 Type 2 diabetes mellitus without complications: Secondary | ICD-10-CM | POA: Diagnosis not present

## 2024-07-15 DIAGNOSIS — I10 Essential (primary) hypertension: Secondary | ICD-10-CM | POA: Insufficient documentation

## 2024-07-15 DIAGNOSIS — R519 Headache, unspecified: Secondary | ICD-10-CM | POA: Diagnosis not present

## 2024-07-15 DIAGNOSIS — I6782 Cerebral ischemia: Secondary | ICD-10-CM | POA: Diagnosis not present

## 2024-07-15 DIAGNOSIS — Z8673 Personal history of transient ischemic attack (TIA), and cerebral infarction without residual deficits: Secondary | ICD-10-CM | POA: Insufficient documentation

## 2024-07-15 DIAGNOSIS — Z79899 Other long term (current) drug therapy: Secondary | ICD-10-CM | POA: Diagnosis not present

## 2024-07-15 DIAGNOSIS — R9089 Other abnormal findings on diagnostic imaging of central nervous system: Secondary | ICD-10-CM

## 2024-07-15 DIAGNOSIS — R0602 Shortness of breath: Secondary | ICD-10-CM | POA: Diagnosis not present

## 2024-07-15 DIAGNOSIS — I610 Nontraumatic intracerebral hemorrhage in hemisphere, subcortical: Secondary | ICD-10-CM | POA: Diagnosis not present

## 2024-07-15 DIAGNOSIS — Z7984 Long term (current) use of oral hypoglycemic drugs: Secondary | ICD-10-CM | POA: Diagnosis not present

## 2024-07-15 DIAGNOSIS — Z87891 Personal history of nicotine dependence: Secondary | ICD-10-CM | POA: Diagnosis not present

## 2024-07-15 DIAGNOSIS — I499 Cardiac arrhythmia, unspecified: Secondary | ICD-10-CM | POA: Diagnosis not present

## 2024-07-15 DIAGNOSIS — K5900 Constipation, unspecified: Secondary | ICD-10-CM | POA: Diagnosis not present

## 2024-07-15 LAB — BASIC METABOLIC PANEL WITH GFR
Anion gap: 10 (ref 5–15)
BUN: 26 mg/dL — ABNORMAL HIGH (ref 8–23)
CO2: 22 mmol/L (ref 22–32)
Calcium: 8.9 mg/dL (ref 8.9–10.3)
Chloride: 106 mmol/L (ref 98–111)
Creatinine, Ser: 1 mg/dL (ref 0.44–1.00)
GFR, Estimated: >60 mL/min (ref >60)
Glucose, Bld: 127 mg/dL — ABNORMAL HIGH (ref 70–99)
Potassium: 4.2 mmol/L (ref 3.5–5.1)
Sodium: 138 mmol/L (ref 135–145)

## 2024-07-15 LAB — CBC
HCT: 37.8 % (ref 36.0–46.0)
Hemoglobin: 11.4 g/dL — ABNORMAL LOW (ref 12.0–15.0)
MCH: 24.6 pg — ABNORMAL LOW (ref 26.0–34.0)
MCHC: 30.2 g/dL (ref 30.0–36.0)
MCV: 81.5 fL (ref 80.0–100.0)
Platelets: 250 K/uL (ref 150–400)
RBC: 4.64 MIL/uL (ref 3.87–5.11)
RDW: 17.8 % — ABNORMAL HIGH (ref 11.5–15.5)
WBC: 9.3 K/uL (ref 4.0–10.5)
nRBC: 0 % (ref 0.0–0.2)

## 2024-07-15 LAB — I-STAT CG4 LACTIC ACID, ED: Lactic Acid, Venous: 1.2 mmol/L (ref 0.5–1.9)

## 2024-07-15 LAB — TROPONIN I (HIGH SENSITIVITY): Troponin I (High Sensitivity): 7 ng/L (ref <18)

## 2024-07-15 MED ORDER — ACETAMINOPHEN 325 MG PO TABS: 650.0000 mg | ORAL_TABLET | Freq: Four times a day (QID) | ORAL | 0 refills | Status: AC | PRN

## 2024-07-15 MED ORDER — ACETAMINOPHEN 325 MG PO TABS
650.0000 mg | ORAL_TABLET | Freq: Once | ORAL | Status: AC
  Administered 2024-07-16: 650 mg via ORAL
  Filled 2024-07-15: qty 2

## 2024-07-15 MED ORDER — METOCLOPRAMIDE HCL 10 MG PO TABS
10.0000 mg | ORAL_TABLET | Freq: Once | ORAL | Status: AC
  Administered 2024-07-16: 10 mg via ORAL
  Filled 2024-07-15 (×2): qty 1

## 2024-07-15 MED ORDER — METOCLOPRAMIDE HCL 10 MG PO TABS
10.0000 mg | ORAL_TABLET | Freq: Three times a day (TID) | ORAL | 0 refills | Status: DC | PRN
Start: 2024-07-15 — End: 2024-07-23

## 2024-07-15 NOTE — ED Provider Notes (Signed)
 [place] EMERGENCY DEPARTMENT AT [facility] Provider Note   CSN: [id number] Arrival date & time: 07/15/24  8073     Patient presents with: Hypertension and Dizziness   [name] is a 68 y.o. female presenting from home with headaches, intermittent dizziness, feeling unwell.  Patient says she has had fatigue for about 2 weeks.  She has chronic pain and low energy.  She says she has sensations of vertigo on and off, as well as a near constant headache for several days.  The headache is persistent whether she is laying or standing but prevents her from sleeping at night.  She says she feels throbbing behind her eyes.  She does not regularly check her blood pressure at home.  However EMS had reported blood pressures of 280 and 230 systolic en route to the hospital.  Patient reports she is on losartan and lisinopril for high blood pressure and takes these regularly.  She says she quit smoking about 6 months ago.  She states her PCP is trying to arrange for her to have an outpatient MRI done for her persistent headaches.  She does have a history of cataract surgery.  She denies visual changes or pain in her eyes.  She reports her blood pressure is normally a little high but not this high.  SBP 150's-170s per my review of 2024 and 2025 medical office visits with Dr Diona   HPI     Prior to Admission medications   Medication Sig Start Date End Date Taking? Authorizing Provider  acetaminophen  (TYLENOL ) 325 MG tablet Take 2 tablets (650 mg total) by mouth every 6 (six) hours as needed for up to 30 doses for moderate pain (pain score 4-6), mild pain (pain score 1-3) or headache. 07/15/24  Yes Rahi Chandonnet, Donnice PARAS, MD  metoCLOPramide (REGLAN) 10 MG tablet Take 1 tablet (10 mg total) by mouth every 8 (eight) hours as needed for up to 12 doses for nausea. 07/15/24  Yes Ysabella Babiarz, Donnice PARAS, MD  amLODipine  (NORVASC ) 5 MG tablet TAKE 1 TABLET (5 MG TOTAL) BY MOUTH DAILY. 06/02/24   Georgina Speaks, FNP  aspirin  EC 81 MG tablet Take 81 mg by mouth daily. Swallow whole.    [provider]  atorvastatin  (LIPITOR) 10 MG tablet TAKE 1 TABLET (10 MG TOTAL) BY MOUTH DAILY FOR CHOLESTEROL 07/06/24   Georgina Speaks, FNP  buPROPion  (WELLBUTRIN  XL) 150 MG 24 hr tablet TAKE 1 TABLET (150 MG TOTAL) BY MOUTH EVERY MORNING. 04/21/24 04/21/25  Georgina Speaks, FNP  Cyanocobalamin  (VITAMIN B12 PO) Take 1 tablet by mouth daily. 5,000 mcg    [provider]  dorzolamide-timolol (COSOPT) 22.3-6.8 MG/ML ophthalmic solution Place 1 drop into both eyes 2 (two) times daily. 03/27/21   [provider]  Doxepin HCl 3 MG TABS Take 1 tablet (3 mg total) by mouth at bedtime as needed. 06/23/24   Georgina Speaks, FNP  FEROSUL 325 (65 Fe) MG tablet TAKE 1 TABLET (325 MG TOTAL) BY MOUTH 2 (TWO) TIMES DAILY WITH A MEAL. 07/06/24   Georgina Speaks, FNP  glucose blood (ONETOUCH ULTRA) test strip USE TO CHECK BLOOD SUGAR ONCE A DAY AS INSTRUCTED 01/01/22   Georgina Speaks, FNP  Lancets [facility] DELICA PLUS LANCET33G) MISC USE TO CHECK BLOOD SUGAR ONCE A DAY AS INSTRUCTED 11/07/20   Georgina Speaks, FNP  metFORMIN  (GLUCOPHAGE ) 500 MG tablet Take 1 tablet (500 mg total) by mouth daily with breakfast. 06/12/24   Georgina Speaks, FNP  Multiple Vitamins-Minerals (  CENTRUM ADULT PO) Take by mouth.    [provider]  omeprazole  (PRILOSEC) 40 MG capsule Take 1 capsule (40 mg total) by mouth daily. 04/21/24   Federico Rosario BROCKS, MD  polyethylene glycol (MIRALAX  / GLYCOLAX ) 17 g packet Take 17 g by mouth daily. 04/21/24   Federico Rosario BROCKS, MD  Ubrogepant (UBRELVY) 50 MG TABS Take 1 tablet (50 mg total) by mouth daily as needed. 07/06/24   Georgina Speaks, FNP  valsartan  (DIOVAN ) 160 MG tablet Take 1 tablet (160 mg total) by mouth daily. 02/12/24   Georgina Speaks, FNP  Vitamin D , Ergocalciferol , (DRISDOL ) 1.25 MG (50000 UNIT) CAPS capsule TAKE 1 CAPSULE (50,000 UNITS TOTAL) BY MOUTH EVERY 7 (SEVEN) DAYS. 06/02/24   Georgina Speaks, FNP    Allergies: Patient has no known allergies.    Review of Systems  Updated Vital Signs BP (!) 150/64 (BP Location: Left Arm)   Pulse 73   Temp 98.4 F (36.9 C)   Resp (!) 21   Ht 5' 5 (1.651 m)   Wt 77.6 kg   SpO2 100%   BMI 28.46 kg/m   Physical Exam Constitutional:      General: She is not in acute distress. HENT:     Head: Normocephalic and atraumatic.  Eyes:     Conjunctiva/sclera: Conjunctivae normal.     Pupils: Pupils are equal, round, and reactive to light.  Cardiovascular:     Rate and Rhythm: Normal rate and regular rhythm.  Pulmonary:     Effort: Pulmonary effort is normal. No respiratory distress.  Abdominal:     General: There is no distension.     Tenderness: There is no abdominal tenderness.  Skin:    General: Skin is warm and dry.  Neurological:     General: No focal deficit present.     Mental Status: She is alert and oriented to person, place, and time. Mental status is at baseline.     Cranial Nerves: No cranial nerve deficit.     Sensory: No sensory deficit.     Motor: No weakness.  Psychiatric:        Mood and Affect: Mood normal.        Behavior: Behavior normal.     (all labs ordered are listed, but only abnormal results are displayed) Labs Reviewed  BASIC METABOLIC PANEL WITH GFR - Abnormal; Notable for the following components:      Result Value   Glucose, Bld 127 (*)    BUN 26 (*)    All other components within normal limits  CBC - Abnormal; Notable for the following components:   Hemoglobin 11.4 (*)    MCH 24.6 (*)    RDW 17.8 (*)    All other components within normal limits  I-STAT CG4 LACTIC ACID, ED  TROPONIN I (HIGH SENSITIVITY)    EKG: EKG Interpretation Date/Time:  Wednesday July 15 2024 19:35:07 EST Ventricular Rate:  69 PR Interval:  59 QRS Duration:  101 QT Interval:  440 QTC Calculation: 385 R Axis:   -12  Text Interpretation: Sinus rhythm Supraventricular bigeminy Short PR interval  Confirmed by Cottie Cough [id number]) on 07/15/2024 8:26:17 PM  Radiology: MR BRAIN WO CONTRAST Result Date: 07/15/2024 EXAM: MRI BRAIN WITHOUT CONTRAST 07/15/2024 10:12:20 PM TECHNIQUE: Multiplanar multisequence MRI of the head/brain was performed without the administration of intravenous contrast. COMPARISON: CT head 07/13/2024 CLINICAL HISTORY: Headaches, vertigo, elevated blood pressure. FINDINGS: BRAIN AND VENTRICLES: Small focus of restricted diffusion on the superomedial margin  of the remote left parietal infarct (see series 2, image 34), possibly acute infarct or artifact. No acute intracranial hemorrhage. Sequela of prior hemorrhage in left basal ganglia. No mass. No midline shift. No hydrocephalus. Normal flow voids. Moderate to advanced T2/FLAIR hyperintensities in the periventricular white matter, compatible with chronic microvascular ischemic change. ORBITS: No acute abnormality. SINUSES AND MASTOIDS: No acute abnormality. BONES AND SOFT TISSUES: Normal marrow signal. IMPRESSION: 1. 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 2. No other acute infarct identified. Findings on the same CT head were likely artifactual. 3. Moderate to advanced chronic microvascular ischemic change. Electronically signed by: Gilmore Molt MD 07/15/2024 10:48 PM EST RP Workstation: [id number]     Procedures   Medications Ordered in the ED  metoCLOPramide (REGLAN) tablet 10 mg (has no administration in time range)  acetaminophen  (TYLENOL ) tablet 650 mg (has no administration in time range)    Clinical Course as of 07/15/24 2331  Wed Jul 15, 2024  2250 Paged neurologist regarding MRI finding [MT]  2251 Patient's manual BP within her normal limits [MT]  2328 I spoke to Dr Vanessa neurology who feels MRI reported acute infarct is very likely artifact, and no emergent workup is needed in hospital for this, but pt could be referred to neurology  outpatient for her persistent headaches.  The patient is very comfortable with this plan. [MT]    Clinical Course User Index [MT] Lulla Linville, Donnice PARAS, MD                                 Medical Decision Making Amount and/or Complexity of Data Reviewed Labs: ordered. Radiology: ordered.  Risk OTC drugs. Prescription drug management.   This patient presents to the ED with concern for fatigue, headaches, intermittent dizziness, labile blood pressure. This involves an extensive number of treatment options, and is a complaint that carries with it a high risk of complications and morbidity.  The differential diagnosis includes hypertensive emergency versus CVA versus TIA versus anemia versus metabolic derangement versus other  Co-morbidities that complicate the patient evaluation: Cardiovascular and stroke risk factors including diabetes, advanced age, former smoker   External records from outside source obtained and reviewed including office evaluation 1 week ago, Her 27th, for acute intractable headache, at that that time ongoing for about 2 weeks.  Patient had completed prednisone and antibiotics already for the headache.  CT scan of the brain performed 2 days ago was concerning for potential infarct, the patient was referred urgently into the ED.  I ordered and personally interpreted labs.  The pertinent results include: No emergent findings  I ordered imaging studies including MRI of the brainMRI brain I independently visualized and interpreted imaging which showed possible small acute infarct and remote left parietal infarct I agree with the radiologist interpretation  The patient was maintained on a cardiac monitor.  I personally viewed and interpreted the cardiac monitored which showed an underlying rhythm of: Sinus rhythm  Per my interpretation the patient's ECG shows sinus rhythm no acute ischemic findings  I have reviewed the patients home medicines and have made adjustments as  needed  Test Considered: Low suspicion for acute PE, sepsis, bacterial meningitis or CNS infection.  No indication for lumbar puncture at this time  I requested consultation with the neurology,  and discussed lab and imaging findings as well as pertinent plan - they recommend: see ed  course  Oral headache medications given  With manual BP normal I do not think she needs hospitalization for hypertensive emergency.  Doubt PRES.    Dispostion:  After consideration of the diagnostic results and the patients response to treatment, I feel that the patent would benefit from close outpatient follow up       Final diagnoses:  Nonintractable headache, unspecified chronicity pattern, unspecified headache type  Hypertension, unspecified type    ED Discharge Orders          Ordered    Ambulatory referral to Neurology       Comments: An appointment is requested in approximately: 1 week Persistent headache x 3 weeks   07/15/24 2330    metoCLOPramide (REGLAN) 10 MG tablet  Every 8 hours PRN        07/15/24 2330    acetaminophen  (TYLENOL ) 325 MG tablet  Every 6 hours PRN        07/15/24 2330               Cottie Donnice PARAS, MD 07/15/24 [phone]

## 2024-07-15 NOTE — Discharge Instructions (Signed)
 You are having a headache. This condition is very commonly seen in the Emergency Department. No specific cause was found today for your headache. It may have been a migraine or another cause of headache. Stress, anxiety, fatigue, and depression are common triggers for headaches.   Fortunately, your headache today does not appear to be life-threatening or require hospitalization at this time. This may change in the future. Sometimes headaches can appear benign (not harmful), but then more serious symptoms can develop, which should prompt an immediate re-evaluation by your doctor or the emergency department.  It is very important that you speak to your primary care doctor in the next 48 hours, to re-evaluate your symptoms, and to determine whether you need any further diagnostic testing or treatment.   SEEK MEDICAL ATTENTION IF: - you develop reaction or side effects to the medications prescribed.  - your headache is not improving within 48 hours - you have multiple episodes of vomiting or can't drink fluids - you have a change in pattern from your usual headache (eg. New location, new intensity, new symptoms, etc)  RETURN IMMEDIATELY IF you develop a sudden, severe worsening of your headache or confusion, become poorly responsive, feel like passing out, develop a fever above 100.2F, have a change in speech, vision, or swallowing, develop new weakness, numbness, tingling, or incoordination, or have a seizure.

## 2024-07-15 NOTE — ED Triage Notes (Signed)
 BIB [facility] from home . Fatigue , generalized weakness for 2 weeks. Head-spinning, dizziness had gotten worse today. Chronic pain.  280/100, 230/100 Complaint w/ HTN meds, arrhythmia ranging from 50s -90s 98 % RA ,137 CBG

## 2024-07-15 NOTE — ED Triage Notes (Signed)
 Pt endorses that she has had dizziness for months now and has had multiple test done and nothing has been found.

## 2024-07-16 ENCOUNTER — Other Ambulatory Visit: Payer: Self-pay | Admitting: Nurse Practitioner

## 2024-07-16 ENCOUNTER — Ambulatory Visit: Payer: Self-pay

## 2024-07-16 ENCOUNTER — Encounter: Payer: Self-pay | Admitting: Neurology

## 2024-07-16 DIAGNOSIS — R519 Headache, unspecified: Secondary | ICD-10-CM

## 2024-07-16 DIAGNOSIS — R9089 Other abnormal findings on diagnostic imaging of central nervous system: Secondary | ICD-10-CM

## 2024-07-16 NOTE — Telephone Encounter (Signed)
 FYI Only or Action Required?: FYI only for provider: appointment scheduled on 07/23/24.  Patient was last seen in primary care on 07/06/2024 by Georgina Speaks, FNP.  Called Nurse Triage reporting Migraine.  Symptoms began several months ago.  Interventions attempted: Nothing.  Symptoms are: stable.  Triage Disposition: See PCP Within 2 Weeks  Patient/caregiver understands and will follow disposition?: Yes Reason for Disposition  Requesting regular office appointment  Answer Assessment - Initial Assessment Questions Presented in ED yesterday for ongoing headaches, BP was elevated, given an MRI and was scheduled with neurology, appointment is not until February. No available appointments in PCP office, called [facility] and spoke with Shivan, was advised does not need to be scheduled as a hosp f/u since she was not admitted and to schedule acute visit for ER f/u.  1. REASON FOR CALL: What is the main reason for your call? or How can I best help you?     Headaches, ED recommending close outpatient f/u.  2. SYMPTOMS : Do you have any symptoms?      Migraine, nothing new or worse  Protocols used: Information Only Call - No Triage-A-AH

## 2024-07-16 NOTE — Progress Notes (Signed)
 Ordered new urgent referral to neurology

## 2024-07-21 DIAGNOSIS — M48062 Spinal stenosis, lumbar region with neurogenic claudication: Secondary | ICD-10-CM | POA: Diagnosis not present

## 2024-07-21 DIAGNOSIS — M6281 Muscle weakness (generalized): Secondary | ICD-10-CM | POA: Diagnosis not present

## 2024-07-21 DIAGNOSIS — M5451 Vertebrogenic low back pain: Secondary | ICD-10-CM | POA: Diagnosis not present

## 2024-07-21 DIAGNOSIS — R2689 Other abnormalities of gait and mobility: Secondary | ICD-10-CM | POA: Diagnosis not present

## 2024-07-23 ENCOUNTER — Ambulatory Visit: Payer: Self-pay | Admitting: Nurse Practitioner

## 2024-07-23 ENCOUNTER — Ambulatory Visit: Admitting: Neurology

## 2024-07-23 ENCOUNTER — Encounter: Payer: Self-pay | Admitting: Neurology

## 2024-07-23 VITALS — BP 180/91 | HR 50 | Ht 65.0 in | Wt 175.0 lb

## 2024-07-23 DIAGNOSIS — R519 Headache, unspecified: Secondary | ICD-10-CM | POA: Diagnosis not present

## 2024-07-23 DIAGNOSIS — I679 Cerebrovascular disease, unspecified: Secondary | ICD-10-CM | POA: Diagnosis not present

## 2024-07-23 DIAGNOSIS — D509 Iron deficiency anemia, unspecified: Secondary | ICD-10-CM

## 2024-07-23 MED ORDER — NORTRIPTYLINE HCL 10 MG PO CAPS
20.0000 mg | ORAL_CAPSULE | Freq: Every day | ORAL | 11 refills | Status: AC
Start: 2024-07-23 — End: ?
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
## 2024-07-24 LAB — C-REACTIVE PROTEIN: CRP: 4 mg/L (ref 0–10)

## 2024-07-24 LAB — SEDIMENTATION RATE: Sed Rate: 49 mm/h — ABNORMAL HIGH (ref 0–40)

## 2024-07-27 ENCOUNTER — Encounter (HOSPITAL_BASED_OUTPATIENT_CLINIC_OR_DEPARTMENT_OTHER): Payer: Self-pay

## 2024-07-27 ENCOUNTER — Ambulatory Visit: Payer: Self-pay | Admitting: Neurology

## 2024-07-30 ENCOUNTER — Emergency Department (HOSPITAL_COMMUNITY)

## 2024-07-30 ENCOUNTER — Encounter (HOSPITAL_COMMUNITY): Payer: Self-pay | Admitting: Neurology

## 2024-07-30 ENCOUNTER — Other Ambulatory Visit: Payer: Self-pay

## 2024-07-30 ENCOUNTER — Other Ambulatory Visit: Payer: Self-pay | Admitting: Licensed Clinical Social Worker

## 2024-07-30 ENCOUNTER — Inpatient Hospital Stay (HOSPITAL_COMMUNITY)
Admission: EM | Admit: 2024-07-30 | Discharge: 2024-08-01 | DRG: 063 | Disposition: A | Discharge status: Home Health | Attending: Neurology | Admitting: Neurology

## 2024-07-30 DIAGNOSIS — R29818 Other symptoms and signs involving the nervous system: Secondary | ICD-10-CM | POA: Diagnosis not present

## 2024-07-30 DIAGNOSIS — K219 Gastro-esophageal reflux disease without esophagitis: Secondary | ICD-10-CM | POA: Diagnosis present

## 2024-07-30 DIAGNOSIS — Z833 Family history of diabetes mellitus: Secondary | ICD-10-CM

## 2024-07-30 DIAGNOSIS — E1151 Type 2 diabetes mellitus with diabetic peripheral angiopathy without gangrene: Secondary | ICD-10-CM | POA: Diagnosis not present

## 2024-07-30 DIAGNOSIS — Z8249 Family history of ischemic heart disease and other diseases of the circulatory system: Secondary | ICD-10-CM

## 2024-07-30 DIAGNOSIS — G9389 Other specified disorders of brain: Secondary | ICD-10-CM | POA: Diagnosis not present

## 2024-07-30 DIAGNOSIS — R1310 Dysphagia, unspecified: Secondary | ICD-10-CM | POA: Diagnosis present

## 2024-07-30 DIAGNOSIS — R471 Dysarthria and anarthria: Secondary | ICD-10-CM | POA: Diagnosis present

## 2024-07-30 DIAGNOSIS — E119 Type 2 diabetes mellitus without complications: Secondary | ICD-10-CM | POA: Diagnosis present

## 2024-07-30 DIAGNOSIS — Z79899 Other long term (current) drug therapy: Secondary | ICD-10-CM

## 2024-07-30 DIAGNOSIS — I63412 Cerebral infarction due to embolism of left middle cerebral artery: Secondary | ICD-10-CM

## 2024-07-30 DIAGNOSIS — R29702 NIHSS score 2: Secondary | ICD-10-CM | POA: Diagnosis not present

## 2024-07-30 DIAGNOSIS — D72829 Elevated white blood cell count, unspecified: Secondary | ICD-10-CM | POA: Diagnosis present

## 2024-07-30 DIAGNOSIS — R29810 Facial weakness: Secondary | ICD-10-CM | POA: Diagnosis not present

## 2024-07-30 DIAGNOSIS — Z87891 Personal history of nicotine dependence: Secondary | ICD-10-CM

## 2024-07-30 DIAGNOSIS — Z7902 Long term (current) use of antithrombotics/antiplatelets: Secondary | ICD-10-CM

## 2024-07-30 DIAGNOSIS — I6350 Cerebral infarction due to unspecified occlusion or stenosis of unspecified cerebral artery: Principal | ICD-10-CM | POA: Diagnosis present

## 2024-07-30 DIAGNOSIS — E042 Nontoxic multinodular goiter: Secondary | ICD-10-CM | POA: Diagnosis present

## 2024-07-30 DIAGNOSIS — I6523 Occlusion and stenosis of bilateral carotid arteries: Secondary | ICD-10-CM | POA: Diagnosis not present

## 2024-07-30 DIAGNOSIS — I10 Essential (primary) hypertension: Secondary | ICD-10-CM | POA: Diagnosis present

## 2024-07-30 DIAGNOSIS — I639 Cerebral infarction, unspecified: Principal | ICD-10-CM | POA: Diagnosis present

## 2024-07-30 DIAGNOSIS — I69391 Dysphagia following cerebral infarction: Secondary | ICD-10-CM | POA: Diagnosis not present

## 2024-07-30 DIAGNOSIS — Z7982 Long term (current) use of aspirin: Secondary | ICD-10-CM | POA: Diagnosis not present

## 2024-07-30 DIAGNOSIS — Z803 Family history of malignant neoplasm of breast: Secondary | ICD-10-CM

## 2024-07-30 DIAGNOSIS — I610 Nontraumatic intracerebral hemorrhage in hemisphere, subcortical: Secondary | ICD-10-CM | POA: Diagnosis not present

## 2024-07-30 DIAGNOSIS — R29704 NIHSS score 4: Secondary | ICD-10-CM | POA: Diagnosis not present

## 2024-07-30 DIAGNOSIS — R4701 Aphasia: Secondary | ICD-10-CM | POA: Diagnosis not present

## 2024-07-30 DIAGNOSIS — R000 Tachycardia, unspecified: Secondary | ICD-10-CM | POA: Diagnosis not present

## 2024-07-30 DIAGNOSIS — I6782 Cerebral ischemia: Secondary | ICD-10-CM | POA: Diagnosis not present

## 2024-07-30 DIAGNOSIS — E785 Hyperlipidemia, unspecified: Secondary | ICD-10-CM | POA: Diagnosis present

## 2024-07-30 DIAGNOSIS — I6389 Other cerebral infarction: Secondary | ICD-10-CM | POA: Diagnosis not present

## 2024-07-30 LAB — CBC
HCT: 40.1 % (ref 36.0–46.0)
Hemoglobin: 12.2 g/dL (ref 12.0–15.0)
MCH: 24.9 pg — ABNORMAL LOW (ref 26.0–34.0)
MCHC: 30.4 g/dL (ref 30.0–36.0)
MCV: 82 fL (ref 80.0–100.0)
Platelets: 341 K/uL (ref 150–400)
RBC: 4.89 MIL/uL (ref 3.87–5.11)
RDW: 17 % — ABNORMAL HIGH (ref 11.5–15.5)
WBC: 11.8 K/uL — ABNORMAL HIGH (ref 4.0–10.5)
nRBC: 0 % (ref 0.0–0.2)

## 2024-07-30 LAB — PROTIME-INR
INR: 1 (ref 0.8–1.2)
Prothrombin Time: 14.3 s (ref 11.4–15.2)

## 2024-07-30 LAB — DIFFERENTIAL
Abs Immature Granulocytes: 0.06 K/uL (ref 0.00–0.07)
Basophils Absolute: 0.1 K/uL (ref 0.0–0.1)
Basophils Relative: 1 %
Eosinophils Absolute: 0.1 K/uL (ref 0.0–0.5)
Eosinophils Relative: 1 %
Immature Granulocytes: 1 %
Lymphocytes Relative: 17 %
Lymphs Abs: 2 K/uL (ref 0.7–4.0)
Monocytes Absolute: 0.8 K/uL (ref 0.1–1.0)
Monocytes Relative: 7 %
Neutro Abs: 8.8 K/uL — ABNORMAL HIGH (ref 1.7–7.7)
Neutrophils Relative %: 73 %

## 2024-07-30 LAB — I-STAT CHEM 8, ED
BUN: 27 mg/dL — ABNORMAL HIGH (ref 8–23)
Calcium, Ion: 1.19 mmol/L (ref 1.15–1.40)
Chloride: 106 mmol/L (ref 98–111)
Creatinine, Ser: 1 mg/dL (ref 0.44–1.00)
Glucose, Bld: 98 mg/dL (ref 70–99)
HCT: 41 % (ref 36.0–46.0)
Hemoglobin: 13.9 g/dL (ref 12.0–15.0)
Potassium: 4 mmol/L (ref 3.5–5.1)
Sodium: 142 mmol/L (ref 135–145)
TCO2: 23 mmol/L (ref 22–32)

## 2024-07-30 LAB — CBG MONITORING, ED: Glucose-Capillary: 95 mg/dL (ref 70–99)

## 2024-07-30 LAB — COMPREHENSIVE METABOLIC PANEL WITH GFR
ALT: 11 U/L (ref 0–44)
AST: 12 U/L — ABNORMAL LOW (ref 15–41)
Albumin: 3.7 g/dL (ref 3.5–5.0)
Alkaline Phosphatase: 61 U/L (ref 38–126)
Anion gap: 10 (ref 5–15)
BUN: 25 mg/dL — ABNORMAL HIGH (ref 8–23)
CO2: 21 mmol/L — ABNORMAL LOW (ref 22–32)
Calcium: 9.1 mg/dL (ref 8.9–10.3)
Chloride: 107 mmol/L (ref 98–111)
Creatinine, Ser: 1.04 mg/dL — ABNORMAL HIGH (ref 0.44–1.00)
GFR, Estimated: 59 mL/min — ABNORMAL LOW (ref >60)
Glucose, Bld: 98 mg/dL (ref 70–99)
Potassium: 3.9 mmol/L (ref 3.5–5.1)
Sodium: 138 mmol/L (ref 135–145)
Total Bilirubin: 0.2 mg/dL (ref 0.0–1.2)
Total Protein: 7.3 g/dL (ref 6.5–8.1)

## 2024-07-30 LAB — APTT: aPTT: 26 s (ref 24–36)

## 2024-07-30 LAB — ETHANOL: Alcohol, Ethyl (B): <15 mg/dL (ref <15)

## 2024-07-30 LAB — HIV ANTIBODY (ROUTINE TESTING W REFLEX): HIV Screen 4th Generation wRfx: NONREACTIVE

## 2024-07-30 MED ORDER — HYDRALAZINE HCL 20 MG/ML IJ SOLN
10.0000 mg | Freq: Once | INTRAMUSCULAR | Status: AC
  Administered 2024-07-30: 10 mg via INTRAVENOUS

## 2024-07-30 MED ORDER — IOHEXOL 350 MG/ML SOLN
75.0000 mL | Freq: Once | INTRAVENOUS | Status: AC | PRN
  Administered 2024-07-30: 75 mL via INTRAVENOUS

## 2024-07-30 MED ORDER — LABETALOL HCL 5 MG/ML IV SOLN
20.0000 mg | INTRAVENOUS | Status: DC | PRN
  Administered 2024-07-31: 20 mg via INTRAVENOUS
  Filled 2024-07-30: qty 4

## 2024-07-30 MED ORDER — SODIUM CHLORIDE 0.9% FLUSH: 3.0000 mL | Freq: Once | INTRAVENOUS | Status: DC

## 2024-07-30 MED ORDER — CLEVIDIPINE BUTYRATE 0.5 MG/ML IV EMUL
0.0000 mg/h | INTRAVENOUS | Status: DC
Start: 2024-07-30 — End: 2024-07-31
  Administered 2024-07-30: 2 mg/h via INTRAVENOUS

## 2024-07-30 MED ORDER — ONDANSETRON HCL 4 MG/2ML IJ SOLN
4.0000 mg | Freq: Four times a day (QID) | INTRAMUSCULAR | Status: DC | PRN
  Administered 2024-07-30: 4 mg via INTRAVENOUS
  Filled 2024-07-30: qty 2

## 2024-07-30 MED ORDER — SODIUM CHLORIDE 0.9 % IV SOLN: INTRAVENOUS | Status: DC

## 2024-07-30 MED ORDER — ACETAMINOPHEN 160 MG/5ML PO SOLN: 650.0000 mg | ORAL | Status: DC | PRN

## 2024-07-30 MED ORDER — ACETAMINOPHEN 650 MG RE SUPP: 650.0000 mg | RECTAL | Status: DC | PRN

## 2024-07-30 MED ORDER — HYDRALAZINE HCL 20 MG/ML IJ SOLN
INTRAMUSCULAR | Status: AC
  Filled 2024-07-30: qty 1

## 2024-07-30 MED ORDER — STROKE: EARLY STAGES OF RECOVERY BOOK
Freq: Once | Status: AC
  Filled 2024-07-30: qty 1

## 2024-07-30 MED ORDER — CLEVIDIPINE BUTYRATE 0.5 MG/ML IV EMUL
INTRAVENOUS | Status: AC
  Filled 2024-07-30: qty 50

## 2024-07-30 MED ORDER — ORAL CARE MOUTH RINSE: 15.0000 mL | OROMUCOSAL | Status: DC | PRN

## 2024-07-30 MED ORDER — SENNOSIDES-DOCUSATE SODIUM 8.6-50 MG PO TABS
1.0000 | ORAL_TABLET | Freq: Every evening | ORAL | Status: DC | PRN
Start: 2024-07-30 — End: 2024-08-01
  Administered 2024-07-31: 1 via ORAL
  Filled 2024-07-30: qty 1

## 2024-07-30 MED ORDER — PANTOPRAZOLE SODIUM 40 MG IV SOLR
40.0000 mg | Freq: Every day | INTRAVENOUS | Status: DC
  Administered 2024-07-30: 40 mg via INTRAVENOUS
  Filled 2024-07-30: qty 10

## 2024-07-30 MED ORDER — TENECTEPLASE 25 MG IV KIT
0.2500 mg/kg | PACK | Freq: Once | INTRAVENOUS | Status: AC
  Administered 2024-07-30: 20 mg via INTRAVENOUS
  Filled 2024-07-30: qty 5

## 2024-07-30 MED ORDER — ACETAMINOPHEN 325 MG PO TABS
650.0000 mg | ORAL_TABLET | ORAL | Status: DC | PRN
  Administered 2024-07-31 (×2): 650 mg via ORAL
  Filled 2024-07-30 (×2): qty 2

## 2024-07-30 NOTE — Progress Notes (Signed)
 PHARMACIST CODE STROKE RESPONSE  Notified to mix TNK at 1316 by Dr. Rosemarie TNK preparation completed at 1317  TNK dose = 20 mg IV over 5 seconds at 1317  Issues/delays encountered (if applicable): BP corrected prior to giving TNK  Leonor GORMAN Bash 07/30/24 1:23 PM

## 2024-07-30 NOTE — Patient Outreach (Signed)
 Complex Care Management   Visit Note  07/30/2024  Name:  [name] MRN: [id number] DOB: 18-Jan-1956  Situation: Referral received for Complex Care Management related to Mental/Behavioral Health diagnosis MDD I obtained verbal consent from Patient.  Visit completed with Patient  on the phone  Background:   Past Medical History:  Diagnosis Date   Anemia 01/07/2020   REQUIRING TRANSFUSION   Back pain    Diabetes mellitus without complication (HCC)    GERD (gastroesophageal reflux disease)    Hypertension    Olecranon bursitis of left elbow    Sprain of left ankle 01/16/2023   Traumatic closed displaced fracture of shaft of right tibia and fibula 06/10/2016    Assessment: Patient Reported Symptoms:  Cognitive Cognitive Status: No symptoms reported, Normal speech and language skills, Alert and oriented to person, place, and time Cognitive/Intellectual Conditions Management [RPT]: None reported or documented in medical history or problem list   Health Maintenance Behaviors: Annual physical exam  Neurological Neurological Review of Symptoms: Not assessed    HEENT HEENT Symptoms Reported: Other: HEENT Management Strategies: Coping strategies, Routine screening HEENT Comment: Pt has an upcoming cataract surgery on right eye on 08/28/24    Cardiovascular Cardiovascular Symptoms Reported: Other: Does patient have uncontrolled Hypertension?: No Cardiovascular Management Strategies: Coping strategies Cardiovascular Comment: Pt has completed an MRI and has additional testing scheduled on 08/27/24  Respiratory Respiratory Symptoms Reported: Not assesed    Endocrine Endocrine Symptoms Reported: Not assessed    Gastrointestinal Gastrointestinal Symptoms Reported: No symptoms reported      Genitourinary Genitourinary Symptoms Reported: No symptoms reported    Integumentary Integumentary Symptoms Reported: No symptoms reported    Musculoskeletal Musculoskelatal Symptoms  Reviewed: Back pain Musculoskeletal Management Strategies: Coping strategies, Routine screening      Psychosocial Psychosocial Symptoms Reported: Other Other Psychosocial Conditions: Stress Additional Psychological Details: Pt reports she is doing well; however, endorsed some stress from upcoming procedures and testing. Healthy coping skills discussed Behavioral Management Strategies: Adequate rest, Medication therapy, Support system Major Change/Loss/Stressor/Fears (CP): Medical condition, self Techniques to Cope with Loss/Stress/Change: Diversional activities, Spiritual practice(s), Medication      07/30/2024    PHQ2-9 Depression Screening   Little interest or pleasure in doing things    Feeling down, depressed, or hopeless    PHQ-2 - Total Score    Trouble falling or staying asleep, or sleeping too much    Feeling tired or having little energy    Poor appetite or overeating     Feeling bad about yourself - or that you are a failure or have let yourself or your family down    Trouble concentrating on things, such as reading the newspaper or watching television    Moving or speaking so slowly that other people could have noticed.  Or the opposite - being so fidgety or restless that you have been moving around a lot more than usual    Thoughts that you would be better off dead, or hurting yourself in some way    PHQ2-9 Total Score    If you checked off any problems, how difficult have these problems made it for you to do your work, take care of things at home, or get along with other people    Depression Interventions/Treatment      There were no vitals filed for this visit.    Medications Reviewed Today     Reviewed by Ezzard Rolin BIRCH, LCSW (Social Worker) on 07/30/24 at 1552  Med List  Status: <None>   Medication Order Taking? Sig Documenting Provider Last Dose Status Informant   stroke: early stages of recovery book [id number]   Elicia Sharper, DO  Active   0.9 %  sodium  chloride infusion [id number]   Elicia Sharper, DO  Active   acetaminophen  (TYLENOL ) 160 MG/5ML solution 650 mg [id number]   Zheng, Michael, DO  Active   acetaminophen  (TYLENOL ) 325 MG tablet [id number] No Take 2 tablets (650 mg total) by mouth every 6 (six) hours as needed for up to 30 doses for moderate pain (pain score 4-6), mild pain (pain score 1-3) or headache. Cottie Donnice PARAS, MD Past Week Active Self, Pharmacy Records  acetaminophen  (TYLENOL ) suppository 650 mg [id number]   Zheng, Michael, DO  Active   acetaminophen  (TYLENOL ) tablet 650 mg [id number]   Elicia Sharper, DO  Active   amLODipine  (NORVASC ) 5 MG tablet [id number] No TAKE 1 TABLET (5 MG TOTAL) BY MOUTH DAILY. Georgina Speaks, FNP 07/29/2024 Morning Active Self, Pharmacy Records  aspirin  EC 81 MG tablet [id number] No Take 81 mg by mouth daily. [provider] 07/29/2024 Morning Active Self, Pharmacy Records  atorvastatin  (LIPITOR) 10 MG tablet [id number] No TAKE 1 TABLET (10 MG TOTAL) BY MOUTH DAILY FOR CHOLESTEROL Georgina Speaks, FNP 07/29/2024 Morning Active Self, Pharmacy Records  buPROPion  (WELLBUTRIN  XL) 150 MG 24 hr tablet [id number] No TAKE 1 TABLET (150 MG TOTAL) BY MOUTH EVERY MORNING. Georgina Speaks, FNP 07/29/2024 Morning Active Self, Pharmacy Records  clevidipine (CLEVIPREX) infusion 0.5 mg/mL [id number]   Rosemarie Eather RAMAN, MD  Active   dorzolamide-timolol (COSOPT) 22.3-6.8 MG/ML ophthalmic solution [id number] No Place 1 drop into both eyes 2 (two) times daily. [provider] 07/29/2024 Evening Active Self, Pharmacy Records  Doxepin HCl 3 MG TABS [id number] No Take 1 tablet (3 mg total) by mouth at bedtime as needed.  Patient not taking: Reported on 07/30/2024   Georgina Speaks, FNP Not Taking Active Self, Pharmacy Records  FEROSUL 325 (65 Fe) MG tablet [id number] No TAKE 1 TABLET (325 MG TOTAL) BY MOUTH 2 (TWO) TIMES DAILY WITH A MEAL.  Patient taking differently: Take 325 mg by mouth daily.   Georgina Speaks, FNP  07/29/2024 Morning Active Self, Pharmacy Records  glucose blood (ONETOUCH ULTRA) test strip [id number]  USE TO CHECK BLOOD SUGAR ONCE A DAY AS INSTRUCTED Georgina Speaks, FNP  Active Self, Pharmacy Records  labetalol (NORMODYNE) injection 20 mg [id number]   Remi Pippin, NP  Active   Lancets [facility] CATHRYNE PLUS [place]) MISC [id number]  USE TO CHECK BLOOD SUGAR ONCE A DAY AS INSTRUCTED Georgina Speaks, FNP  Active Self, Pharmacy Records  metFORMIN  (GLUCOPHAGE ) 500 MG tablet [id number] No Take 1 tablet (500 mg total) by mouth daily with breakfast. Georgina Speaks, FNP 07/29/2024 Morning Active Self, Pharmacy Records  nortriptyline (PAMELOR) 10 MG capsule [id number] No Take 2 capsules (20 mg total) by mouth at bedtime. Onita Duos, MD 07/28/2024 Active Self, Pharmacy Records  omeprazole  (PRILOSEC) 40 MG capsule [id number] No Take 1 capsule (40 mg total) by mouth daily. Federico Rosario BROCKS, MD 07/29/2024 Morning Active Self, Pharmacy Records  ondansetron  (ZOFRAN ) injection 4 mg [id number]   Remi Pippin, NP  Active   pantoprazole (PROTONIX) injection 40 mg [id number]   Elicia Sharper, DO  Active   senna-docusate (Senokot-S) tablet 1 tablet [id number]   Elicia Sharper, DO  Active   sodium chloride  flush (NS) 0.9 % injection 3 mL [id number]   Garrick Charleston, MD  Active   trimethoprim -polymyxin b (POLYTRIM) ophthalmic solution  [id number] No Place 1 drop into both eyes See admin instructions. Administer 1 drop into each eye 4 times daily for 2 days after eye injections [provider] Past Month Active Self, Pharmacy Records  valsartan  (DIOVAN ) 160 MG tablet [id number] No Take 1 tablet (160 mg total) by mouth daily. Georgina Speaks, FNP 07/29/2024 Morning Active Self, Pharmacy Records  Vitamin D , Ergocalciferol , (DRISDOL ) 1.25 MG (50000 UNIT) CAPS capsule [id number] No TAKE 1 CAPSULE (50,000 UNITS TOTAL) BY MOUTH EVERY 7 (SEVEN) DAYS.  Patient taking differently: Take 50,000 Units by mouth every Sunday.   Georgina Speaks, FNP 07/26/2024 Active Self, Pharmacy Records            Recommendation:   Continue Current Plan of Care  Follow Up Plan:   Telephone follow-up in 1 month  Rolin Kerns, LCSW [facility]  [facility], [facility] Clinical Social Worker Direct Dial: [phone]  Fax: [phone] Website: [email or web address] 4:01 PM

## 2024-07-30 NOTE — ED Triage Notes (Signed)
 Pt came in as code stroke, LSN 1145. Sudden onset of dysarthria/left sided facial droop at 1205. EMS states intially left sided weakness/left sided facial droop but then  in transport had right sided facial droop/weakness. Axox4. Hypertensive 190 systolic. Pt did not take BP meds this morning.

## 2024-07-30 NOTE — ED Notes (Signed)
 20 mg of TNK given

## 2024-07-30 NOTE — Patient Instructions (Signed)
 Visit Information  Thank you for taking time to visit with me today. Please don't hesitate to contact me if I can be of assistance to you before our next scheduled appointment.  Your next care management appointment is by telephone on 12/22 at 12:30 PM  Please call the care guide team at [phone] if you need to cancel, schedule, or reschedule an appointment.   Please call the Suicide and Crisis Lifeline: 988 go to [facility] [address] [place] [phone]) call 911 if you are experiencing a Mental Health or Behavioral Health Crisis or need someone to talk to.  Rolin Kerns, LCSW [place]  [facility], [facility] Clinical Social Worker Direct Dial: [phone]  Fax: [phone] Website: [email or web address] 4:01 PM

## 2024-07-30 NOTE — ED Provider Notes (Signed)
 [place] EMERGENCY DEPARTMENT AT [facility] Provider Note   CSN: [id number] Arrival date & time: 07/30/24  1252     Patient presents with: Code Stroke   [name] is a 68 y.o. female.   HPI Patient presents as a code stroke.  No pain.  Last seen normal was before lunch, approximately 2 hours prior to my evaluation. Patient developed facial asymmetry, dysarthria..  Transport had facial droop that was noticeable as well.  EMS reports patient was hypertensive in transport.  Patient denies pain.  Pt came in as code stroke, LSN 1145. Sudden onset of dysarthria/left sided facial droop at 1205. EMS states intially left sided weakness/left sided facial droop but then in transport had right sided facial droop/weakness. Axox4. Hypertensive 190 systolic. Pt did not take BP meds this morning.        Prior to Admission medications   Medication Sig Start Date End Date Taking? Authorizing Provider  acetaminophen  (TYLENOL ) 325 MG tablet Take 2 tablets (650 mg total) by mouth every 6 (six) hours as needed for up to 30 doses for moderate pain (pain score 4-6), mild pain (pain score 1-3) or headache. 07/15/24   Cottie Donnice PARAS, MD  amLODipine  (NORVASC ) 5 MG tablet TAKE 1 TABLET (5 MG TOTAL) BY MOUTH DAILY. 06/02/24   Georgina Speaks, FNP  aspirin  EC 81 MG tablet Take 81 mg by mouth daily. Swallow whole.    [provider]  atorvastatin  (LIPITOR) 10 MG tablet TAKE 1 TABLET (10 MG TOTAL) BY MOUTH DAILY FOR CHOLESTEROL 07/06/24   Georgina Speaks, FNP  buPROPion  (WELLBUTRIN  XL) 150 MG 24 hr tablet TAKE 1 TABLET (150 MG TOTAL) BY MOUTH EVERY MORNING. 04/21/24 04/21/25  Georgina Speaks, FNP  Cyanocobalamin  (VITAMIN B12 PO) Take 1 tablet by mouth daily. 5,000 mcg Patient not taking: Reported on 07/23/2024    [provider]  dorzolamide -timolol  (COSOPT ) 22.3-6.8 MG/ML ophthalmic solution Place 1 drop into both eyes 2 (two) times daily. 03/27/21   [provider]   Doxepin  HCl 3 MG TABS Take 1 tablet (3 mg total) by mouth at bedtime as needed. 06/23/24   Georgina Speaks, FNP  FEROSUL 325 (65 Fe) MG tablet TAKE 1 TABLET (325 MG TOTAL) BY MOUTH 2 (TWO) TIMES DAILY WITH A MEAL. 07/06/24   Georgina Speaks, FNP  glucose blood (ONETOUCH ULTRA) test strip USE TO CHECK BLOOD SUGAR ONCE A DAY AS INSTRUCTED 01/01/22   Georgina Speaks, FNP  Lancets [facility] DELICA PLUS LANCET33G) MISC USE TO CHECK BLOOD SUGAR ONCE A DAY AS INSTRUCTED 11/07/20   Georgina Speaks, FNP  metFORMIN  (GLUCOPHAGE ) 500 MG tablet Take 1 tablet (500 mg total) by mouth daily with breakfast. 06/12/24   Georgina Speaks, FNP  Multiple Vitamins-Minerals (CENTRUM ADULT PO) Take by mouth.    [provider]  nortriptyline  (PAMELOR ) 10 MG capsule Take 2 capsules (20 mg total) by mouth at bedtime. 07/23/24   Onita Duos, MD  omeprazole  (PRILOSEC) 40 MG capsule Take 1 capsule (40 mg total) by mouth daily. 04/21/24   Federico Rosario BROCKS, MD  polyethylene glycol (MIRALAX  / GLYCOLAX ) 17 g packet Take 17 g by mouth daily. Patient not taking: Reported on 07/23/2024 04/21/24   Federico Rosario BROCKS, MD  Ubrogepant  (UBRELVY ) 50 MG TABS Take 1 tablet (50 mg total) by mouth daily as needed. Patient not taking: Reported on 07/23/2024 07/06/24   Georgina Speaks, FNP  valsartan  (DIOVAN ) 160 MG tablet Take 1 tablet (160 mg total) by mouth daily. 02/12/24  Georgina Speaks, FNP  Vitamin D , Ergocalciferol , (DRISDOL ) 1.25 MG (50000 UNIT) CAPS capsule TAKE 1 CAPSULE (50,000 UNITS TOTAL) BY MOUTH EVERY 7 (SEVEN) DAYS. 06/02/24   Georgina Speaks, FNP    Allergies: Patient has no known allergies.    Review of Systems  Updated Vital Signs BP (!) 143/62   Pulse 62   Temp (!) 97.3 F (36.3 C) (Oral)   Resp 17   Ht 1.651 m (5' 5)   Wt 79 kg   SpO2 100%   BMI 28.98 kg/m   Physical Exam Vitals and nursing note reviewed.  Constitutional:      General: She is not in acute distress.    Appearance: She is well-developed.  HENT:     Head:  Normocephalic and atraumatic.  Eyes:     Conjunctiva/sclera: Conjunctivae normal.  Pulmonary:     Effort: Pulmonary effort is normal. No respiratory distress.     Breath sounds: No stridor.  Abdominal:     General: There is no distension.  Skin:    General: Skin is warm and dry.  Neurological:     Mental Status: She is alert and oriented to person, place, and time.     Cranial Nerves: No cranial nerve deficit.     Comments: Facial droop, dysarthria, patient following commands, following conversations.  Psychiatric:        Mood and Affect: Mood normal.     (all labs ordered are listed, but only abnormal results are displayed) Labs Reviewed  CBC - Abnormal; Notable for the following components:      Result Value   WBC 11.8 (*)    MCH 24.9 (*)    RDW 17.0 (*)    All other components within normal limits  DIFFERENTIAL - Abnormal; Notable for the following components:   Neutro Abs 8.8 (*)    All other components within normal limits  I-STAT CHEM 8, ED - Abnormal; Notable for the following components:   BUN 27 (*)    All other components within normal limits  PROTIME-INR  APTT  ETHANOL  COMPREHENSIVE METABOLIC PANEL WITH GFR  HIV ANTIBODY (ROUTINE TESTING W REFLEX)  CBG MONITORING, ED    EKG: EKG Interpretation Date/Time:  Thursday July 30 2024 13:27:34 EST Ventricular Rate:  63 PR Interval:  148 QRS Duration:  106 QT Interval:  446 QTC Calculation: 457 R Axis:   10  Text Interpretation: Sinus rhythm Borderline T wave abnormalities Confirmed by Garrick Charleston [phone]) on 07/30/2024 1:50:22 PM  Radiology: CT ANGIO HEAD NECK W WO CM (CODE STROKE) Result Date: 07/30/2024 EXAM: CTA HEAD AND NECK WITHOUT AND WITH 07/30/2024 01:06:00 PM TECHNIQUE: CTA of the head and neck was performed without and with the administration of 75 mL of iohexol (OMNIPAQUE) 350 MG/ML injection. Multiplanar 2D and/or 3D reformatted images are provided for review. Automated exposure control,  iterative reconstruction, and/or weight based adjustment of the mA/kV was utilized to reduce the radiation dose to as low as reasonably achievable. Stenosis of the internal carotid arteries measured using NASCET criteria. COMPARISON: None available CLINICAL HISTORY: Neuro deficit, acute, stroke suspected. FINDINGS: CTA NECK: AORTIC ARCH AND ARCH VESSELS: No dissection or arterial injury. No significant stenosis of the brachiocephalic or subclavian arteries. CERVICAL CAROTID ARTERIES: Minimal atherosclerotic plaque at the carotid bifurcations. No dissection, arterial injury, or hemodynamically significant stenosis by NASCET criteria. CERVICAL VERTEBRAL ARTERIES: Small amount of calcified plaque in the proximal right V1 segment. No dissection, arterial injury, or significant stenosis. LUNGS AND MEDIASTINUM:  Unremarkable. SOFT TISSUES: Bilateral thyroid  nodules measuring up to 2.2 cm. BONES: Old left rib fractures. CTA HEAD: ANTERIOR CIRCULATION: The intracranial internal carotid arteries are patent with mild atherosclerosis not resulting in significant stenosis. ACAs and MCAs are patent without evidence of a proximal branch occlusion or significant proximal stenosis. No aneurysm. POSTERIOR CIRCULATION: The intracranial vertebral arteries are widely patent to the basilar. Patent left PICA, right AICA, and bilateral SCA origins are visualized. The basilar artery is widely patent. There are right larger than left posterior communicating arteries with mild hypoplasia of the right P1 segment. Both PCAs are patent without evidence of a significant proximal stenosis. No aneurysm. OTHER: No dural venous sinus thrombosis on this non-dedicated study. These results were communicated to Dr. Rosemarie at 1:14 PM on 07/30/2024 by secure text page via the [facility] messaging system. IMPRESSION: 1. Mild atherosclerosis without a large vessel occlusion or significant proximal stenosis in the head or neck. 2. Bilateral thyroid  nodules  measuring up to 2.2 cm; recommend non-emergent thyroid  ultrasound. Electronically signed by: Dasie Hamburg MD 07/30/2024 01:26 PM EST RP Workstation: [id number]   CT HEAD CODE STROKE WO CONTRAST Result Date: 07/30/2024 EXAM: CT HEAD WITHOUT 07/30/2024 01:01:37 PM TECHNIQUE: CT of the head was performed without the administration of intravenous contrast. Automated exposure control, iterative reconstruction, and/or weight based adjustment of the mA/kV was utilized to reduce the radiation dose to as low as reasonably achievable. COMPARISON: Head CT 07/13/2024 and MRI 07/15/2024. CLINICAL HISTORY: Neuro deficit, acute, stroke suspected. FINDINGS: BRAIN AND VENTRICLES: There is no evidence of an acute infarct, intracranial hemorrhage, mass, midline shift, hydrocephalus, or extra-axial fluid collection. Tiny chronic right parietal and small to moderate sized chronic left parietal infarcts are again noted. Patchy hypodensities in the cerebral white matter bilaterally are unchanged and nonspecific but compatible with moderate chronic small vessel ischemic disease. There are small chronic bilateral cerebellar infarcts. No age advanced global cerebral atrophy is evident. Calcified atherosclerosis at the skull base. ORBITS: No acute abnormality. Left cataract extraction. SINUSES AND MASTOIDS: No acute abnormality. SOFT TISSUES AND SKULL: No acute skull fracture. No acute soft tissue abnormality. [place] Stroke Program Early CT Score (ASPECTS) ----- Ganglionic (caudate, IC, lentiform nucleus, insula, M1-M3): 7 Supraganglionic (M4-M6): 3 Total: 10 IMPRESSION: 1. No acute intracranial abnormality. ASPECTS of 10. 2. Chronic ischemia with multiple old infarcts. 3. These results were communicated to Dr. Rosemarie at 1:14 PM on 07/30/2024 by secure text page via the [facility] messaging system. Electronically signed by: Dasie Hamburg MD 07/30/2024 01:18 PM EST RP Workstation: [id number]     Procedures   Medications Ordered in the ED   sodium chloride  flush (NS) 0.9 % injection 3 mL (has no administration in time range)  clevidipine (CLEVIPREX) infusion 0.5 mg/mL (4 mg/hr Intravenous Rate/Dose Verify 07/30/24 1341)   stroke: early stages of recovery book (has no administration in time range)  0.9 %  sodium chloride  infusion (has no administration in time range)  acetaminophen  (TYLENOL ) tablet 650 mg (has no administration in time range)    Or  acetaminophen  (TYLENOL ) 160 MG/5ML solution 650 mg (has no administration in time range)    Or  acetaminophen  (TYLENOL ) suppository 650 mg (has no administration in time range)  senna-docusate (Senokot-S) tablet 1 tablet (has no administration in time range)  pantoprazole (PROTONIX) injection 40 mg (has no administration in time range)  iohexol (OMNIPAQUE) 350 MG/ML injection 75 mL (75 mLs Intravenous Contrast Given 07/30/24 1307)  hydrALAZINE  (APRESOLINE ) injection 10 mg (10 mg Intravenous Given  07/30/24 1337)  hydrALAZINE  (APRESOLINE ) injection 10 mg (10 mg Intravenous Given 07/30/24 1313)  tenecteplase  (TNKASE ) injection for stroke 20 mg (20 mg Intravenous Given 07/30/24 1317)                                    Medical Decision Making Patient with elevated stroke risk profile presents with acute onset dysarthria, facial asymmetry. Patient is awake and alert, but has obvious neuro deficits on arrival, though with patent airway. Patient expeditious evaluation, head CT without hemorrhage, CTA without obvious large vessel occlusion, but with persistent symptoms patient received TNK, case discussed, comanaged with our neurology colleagues. 1:51 PM Patient with ongoing TNK, no decompensation, though no appreciable change in her neurodeficits either.   Amount and/or Complexity of Data Reviewed Independent Historian: EMS External Data Reviewed: notes. Labs:  Decision-making details documented in ED Course. Radiology: independent interpretation performed. Decision-making details  documented in ED Course. ECG/medicine tests: independent interpretation performed. Decision-making details documented in ED Course.  Risk OTC drugs. Prescription drug management. Decision regarding hospitalization.   Patient remains hemodynamically unremarkable, ICU bed ready.   CRITICAL CARE Performed by: Lamar Salen Total critical care time: 35 minutes Critical care time was exclusive of separately billable procedures and treating other patients. Critical care was necessary to treat or prevent imminent or life-threatening deterioration. Critical care was time spent personally by me on the following activities: development of treatment plan with patient and/or surrogate as well as nursing, discussions with consultants, evaluation of patient's response to treatment, examination of patient, obtaining history from patient or surrogate, ordering and performing treatments and interventions, ordering and review of laboratory studies, ordering and review of radiographic studies, pulse oximetry and re-evaluation of patient's condition.   Final diagnoses:  Acute CVA (cerebrovascular accident) [facility])     Salen Lamar, MD 07/30/24 1352

## 2024-07-30 NOTE — Code Documentation (Signed)
 Stroke Response Nurse Documentation Code Documentation  [name] is a 68 y.o. female arriving to [facility]  via [facility] on 07/30/2024 with past medical hx of CVA, HTN, DM. On aspirin  81 mg daily. Code stroke was activated by EMS.   Patient from work where she was LKW at 1145 and now complaining of rt droop and dysarthria.  Stroke team at the bedside on patient arrival. Labs drawn and patient cleared for CT by Dr. Garrick. Patient to CT with team. NIHSS 4, see documentation for details and code stroke times. Patient with right facial droop and dysarthria  on exam. The following imaging was completed:  CT Head and CTA. Patient is a candidate for IV Thrombolytic due to disabling deficit. Patient is not a candidate for IR due to LVO negative.   Care Plan:       TNK:  Q34min x 2 hours, q30min x 6 hours, q1h x 16 hours until 24 hour mark  BP < 180/105  Call Neuro for:  New Headache, worsening symptoms, bleeding, nausea/vomiting, or any signs of angioedema.     Process Delays Noted: lowering BP, pt decision making.  Bedside handoff with ED RN Metta.    Zayn Selley Livengood  Stroke Response RN

## 2024-07-30 NOTE — Plan of Care (Signed)

## 2024-07-30 NOTE — H&P (Signed)
 NEUROLOGY H&P NOTE   Date of service: July 30, 2024 Patient Name: [name] MRN:  [id number] DOB:  1956-03-08 Chief Complaint: code stroke  History of Present Illness  [name] is a 68 y.o. female with hx of T2DM, HTN, IDA, chronic small vessel disease, tobacco use presents with sudden onset of dysarthria with right facial droop. Per EMS, had left sided weakness/facial droop then during transit had right facial droop and weakness. AAOx3. States mild headache on arrival, reports headache for past 2 weeks. She denies any focal neuro deficits during that time. BP elevated and has not taken any BP meds today. Had recently seen Dr. Onita ([facility]) for cerebral vascular disease and new onset headaches w/o focal neuro deficits on 11/13. MRI brain 11/5 showed moderate to advanced small vessel disease w/ possible small acute infarct of L parietal lobe  Last known well: 1145 on 11/20 Modified rankin score: 2-Slight disability-UNABLE to perform all activities but does not need assistance IV Thrombolysis: Yes,   CTH personally reviewed prior to TNK administration Risks, benefits and alternatives of TNK discussed with patient including 4-6% risk of symptomatic hemorrhage and patient agreed.  Reviewed contraindications and patient does not have any Thrombectomy: No, no LVO on CTA  NIHSS components Score: Comment  1a Level of Conscious 0[x]  1[]  2[]  3[]      1b LOC Questions 0[x]  1[]  2[]       1c LOC Commands 0[x]  1[]  2[]       2 Best Gaze 0[x]  1[]  2[]       3 Visual 0[x]  1[]  2[]  3[]      4 Facial Palsy 0[]  1[]  2[x]  3[]      5a Motor Arm - left 0[x]  1[]  2[]  3[]  4[]  UN[]    5b Motor Arm - Right 0[x]  1[]  2[]  3[]  4[]  UN[]    6a Motor Leg - Left 0[x]  1[]  2[]  3[]  4[]  UN[]    6b Motor Leg - Right 0[x]  1[]  2[]  3[]  4[]  UN[]    7 Limb Ataxia 0[x]  1[]  2[]  UN[]      8 Sensory 0[x]  1[]  2[]  UN[]      9 Best Language 0[x]  1[]  2[]  3[]      10 Dysarthria 0[]  1[]  2[x]  UN[]      11 Extinct. and Inattention 0[x]  1[]   2[]       TOTAL: 4      ROS  Comprehensive ROS performed and pertinent positives documented in the HPI  Past History   Past Medical History:  Diagnosis Date   Anemia 01/07/2020   REQUIRING TRANSFUSION   Back pain    Diabetes mellitus without complication (HCC)    GERD (gastroesophageal reflux disease)    Hypertension    Olecranon bursitis of left elbow    Sprain of left ankle 01/16/2023   Traumatic closed displaced fracture of shaft of right tibia and fibula 06/10/2016   Past Surgical History:  Procedure Laterality Date   ABDOMINAL HYSTERECTOMY     spared ovaries   OLECRANON BURSECTOMY Left 06/19/2017   Procedure: Excision olecranon bursa;  Surgeon: Yvone Rush, MD;  Location: [facility];  Service: Orthopedics;  Laterality: Left;  Panel Length: 60 mins    TIBIA IM NAIL INSERTION Right 06/10/2016   Procedure: INTRAMEDULLARY (IM) NAIL TIBIAL RODING;  Surgeon: Rush Yvone, MD;  Location: [facility];  Service: Orthopedics;  Laterality: Right;   Family History  Problem Relation Age of Onset   Diabetes Mother    Hypertension Mother    Diabetes Father    Hypertension Father  Breast cancer Sister    Breast cancer Sister    Breast cancer Paternal Aunt    Esophageal cancer Neg Hx    Liver disease Neg Hx    Colon cancer Neg Hx    Migraines Neg Hx    Stroke Neg Hx    Social History   Socioeconomic History   Marital status: Single    Spouse name: Not on file   Number of children: 0   Years of education: Not on file   Highest education level: Not on file  Occupational History   Occupation: retired  Tobacco Use   Smoking status: Former    Current packs/day: 0.00    Average packs/day: 0.5 packs/day for 30.0 years (15.0 ttl pk-yrs)    Types: Cigarettes    Quit date: 02/2024    Years since quitting: 0.4   Smokeless tobacco: Never   Tobacco comments:    started back in 2012, smoking 1/2 PPD - 05/07/2022; 10/02/2023 - continues to try to quit, quit 4 months ago   Vaping Use   Vaping status: Never Used  Substance and Sexual Activity   Alcohol use: No   Drug use: Not Currently    Frequency: 7.0 times per week    Types: Marijuana    Comment: yesterday   Sexual activity: Yes  Other Topics Concern   Not on file  Social History Narrative   Lives alone but nephew is there sometimes to take care of her   Right handed   Caffeine: was a whole lot but for last 2 weeks has decreased to decaf coffee, no soda    Social Drivers of Corporate Investment Banker Strain: Low Risk  (10/02/2023)   Overall Financial Resource Strain (CARDIA)    Difficulty of Paying Living Expenses: Not hard at all  Food Insecurity: No Food Insecurity (02/11/2024)   Hunger Vital Sign    Worried About Running Out of Food in the Last Year: Never true    Ran Out of Food in the Last Year: Never true  Recent Concern: Food Insecurity - Food Insecurity Present (11/25/2023)   Hunger Vital Sign    Worried About Running Out of Food in the Last Year: Sometimes true    Ran Out of Food in the Last Year: Sometimes true  Transportation Needs: No Transportation Needs (02/11/2024)   PRAPARE - Administrator, Civil Service (Medical): No    Lack of Transportation (Non-Medical): No  Physical Activity: Insufficiently Active (10/02/2023)   Exercise Vital Sign    Days of Exercise per Week: 4 days    Minutes of Exercise per Session: 10 min  Stress: Stress Concern Present (10/02/2023)   [employer] of Occupational Health - Occupational Stress Questionnaire    Feeling of Stress : To some extent  Social Connections: Moderately Integrated (10/02/2023)   Social Connection and Isolation Panel    Frequency of Communication with Friends and Family: More than three times a week    Frequency of Social Gatherings with Friends and Family: More than three times a week    Attends Religious Services: More than 4 times per year    Active Member of [employer] or Organizations: Yes    Attends Museum/gallery Exhibitions Officer: More than 4 times per year    Marital Status: Divorced   No Known Allergies  Medications   Current Facility-Administered Medications:    [START ON 07/31/2024]  stroke: early stages of recovery book, , Does not apply, Once, Zheng,  Ozell, DO   0.9 %  sodium chloride  infusion, , Intravenous, Continuous, Elicia Ozell, DO, Last Rate: 40 mL/hr at 07/30/24 1507, New Bag at 07/30/24 1507   acetaminophen  (TYLENOL ) tablet 650 mg, 650 mg, Oral, Q4H PRN **OR** acetaminophen  (TYLENOL ) 160 MG/5ML solution 650 mg, 650 mg, Per Tube, Q4H PRN **OR** acetaminophen  (TYLENOL ) suppository 650 mg, 650 mg, Rectal, Q4H PRN, Zheng, Michael, DO   clevidipine (CLEVIPREX) infusion 0.5 mg/mL, 0-21 mg/hr, Intravenous, Continuous, Rosemarie Eather RAMAN, MD, Last Rate: 4 mL/hr at 07/30/24 1500, 2 mg/hr at 07/30/24 1500   labetalol (NORMODYNE) injection 20 mg, 20 mg, Intravenous, Q2H PRN, Remi Pippin, NP   ondansetron  (ZOFRAN ) injection 4 mg, 4 mg, Intravenous, Q6H PRN, Remi, Pippin, NP, 4 mg at 07/30/24 1501   pantoprazole (PROTONIX) injection 40 mg, 40 mg, Intravenous, QHS, Zheng, Michael, DO   senna-docusate (Senokot-S) tablet 1 tablet, 1 tablet, Oral, QHS PRN, Zheng, Michael, DO   sodium chloride  flush (NS) 0.9 % injection 3 mL, 3 mL, Intravenous, Once, Garrick Charleston, MD   Vitals   Vitals:   07/30/24 1415 07/30/24 1430 07/30/24 1445 07/30/24 1500  BP: (!) 161/69 (!) 151/68 126/77 (!) 143/69  Pulse: 65 62 63 65  Resp: 15 (!) 8 19 (!) 23  Temp:      TempSrc:      SpO2: 100% 100% 96% 100%  Weight:      Height:         Body mass index is 28.98 kg/m.  Physical Exam   Constitutional: Appears well-developed and well-nourished. No acute distress Psych: Affect appropriate to situation.  Eyes: No scleral injection.  HENT: No OP obstruction.  Head: Normocephalic.  Cardiovascular: Normal rate and regular rhythm.  Respiratory: Effort normal, non-labored breathing.  Skin: WDI.    Neurologic Examination   Neuro: Mental Status: Patient is awake, alert, oriented to person, place, month, year, and situation. Significant dysarthria.  Mild nonfluent speech.  No paraphasic errors.  Able to name repeat and comprehend well.  Able to follow commands and answer appropriately.   Cranial Nerves: II: Visual Fields are full. Pupils are equal, round, and reactive to light.  III,IV, VI: EOMI. No gaze deviation.  V: Facial sensation is symmetric  VII: Right facial droop and asymmetric smile  VIII: Hearing is intact to voice X: Palate elevates symmetrically XI: Shoulder shrug is symmetric. XII: Tongue protrudes midline without atrophy or fasciculations.  Motor: Tone is normal. Bulk is normal. Strength intact in all extremities tested. Sensory: Sensation is symmetric to light touch and temperature in the arms and legs. No extinction to DSS present.  Cerebellar: FNF and HKS are intact bilaterally. No drift.   NIHSS 4 Labs   CBC:  Recent Labs  Lab 07/30/24 1259 07/30/24 1300  WBC 11.8*  --   NEUTROABS 8.8*  --   HGB 12.2 13.9  HCT 40.1 41.0  MCV 82.0  --   PLT 341  --    Basic Metabolic Panel:  Lab Results  Component Value Date   NA 142 07/30/2024   K 4.0 07/30/2024   CO2 21 (L) 07/30/2024   GLUCOSE 98 07/30/2024   BUN 27 (H) 07/30/2024   CREATININE 1.00 07/30/2024   CALCIUM  9.1 07/30/2024   GFRNONAA 59 (L) 07/30/2024   GFRAA 64 08/11/2020   Lipid Panel:  Lab Results  Component Value Date   LDLCALC 56 08/16/2022   HgbA1c:  Lab Results  Component Value Date   HGBA1C 6.7 (H) 06/23/2024   Urine  Drug Screen:     Component Value Date/Time   COCAINSCRNUR Negative 05/07/2022 1629    Alcohol Level     Component Value Date/Time   [facility] <15 07/30/2024 1259   INR  Lab Results  Component Value Date   INR 1.0 07/30/2024   APTT  Lab Results  Component Value Date   APTT 26 07/30/2024     CT Head without contrast(Personally reviewed): No acute  intracranial abnormality. ASPECTS of 10. Chronic ischemia with multiple old infarcts.  CT angio Head and Neck with contrast(Personally reviewed): No LVO or significant stenosis. Mild atherosclerosis.   MRI Brain(Personally reviewed): Ordered  Assessment   [name] is a 68 y.o. female w/ hx of T2DM, HTN, IDA, chronic small vessel disease, tobacco use presents w/ acute dysarthria, right facial droop. Recent MRI 11/5 w/ question of acute infarct vs artifact along the periphery of remote left parietal infarct-more likely a T2 shine through. At that time, patient states no focal neuro deficits and current symptoms occurred today. Suspect possible infarct of L MCA territory from small vessel disease given neuro exam. Within window of TNK, no absolute contraindications at this time. TNK administered in ED, admit to Neuro ICU for close monitoring. Post TNK, still has mild headache that is unchanged and not worsened. No new focal neuro deficits on repeat exam.   Primary Diagnosis:  Cerebral infarction, unspecified. Dysarthria Right facial droop Secondary Diagnosis: Essential (primary) hypertension and Type 2 diabetes mellitus w/o complications  Recommendations  - Fasting lipid panel - MRI brain 24 hours post TNK - Frequent neuro checks  - Echocardiogram - Prophylactic therapy-Antiplatelet med: HOLD until 24 hours post TNK - Risk factor modification - Telemetry monitoring - PT consult, OT consult, Speech consult - Stroke team to follow  ______________________________________________________________________  Ozell Nearing, DO Internal Medicine Resident PGY-3   I have personally obtained history,examined this patient, reviewed notes, independently viewed imaging studies, participated in medical decision making and plan of care.ROS completed by me personally and pertinent positives fully documented  I have made any additions or clarifications directly to the above note. Agree with  note above.  Patient presented with sudden onset of dysarthria and right facial weakness likely due to small left subcortical infarct.  Patient has prior history of strokes and recent MRI 2 weeks ago had shown a small punctate diffusion hyperintensity questionable artifact versus T2 shine through rather than true stroke and patient did not have any focal stroke symptoms at that time.  Discussed risk-benefit and alternatives with the patient including 4 to 6% risk of symptomatic intracranial hemorrhage and patient is agreeable to proceed.  Administered IV tPA as per protocol.  Admitted to neurological ICU.  Strict blood pressure control as per post TNK protocol.  Close neurological monitoring.  Check MRI scan of the brain, echocardiogram, lipid profile hemoglobin A1c. This patient is critically ill and at significant risk of neurological worsening, death and care requires constant monitoring of vital signs, hemodynamics,respiratory and cardiac monitoring, extensive review of multiple databases, frequent neurological assessment, discussion with family, other specialists and medical decision making of high complexity.I have made any additions or clarifications directly to the above note.This critical care time does not reflect procedure time, or teaching time or supervisory time of PA/NP/Med Resident etc but could involve care discussion time.  I spent 50 minutes of neurocritical care time  in the care of  this patient.      Eather Popp, MD Medical Director [facility] Pager:  [phone] 07/30/2024 5:00 PM

## 2024-07-31 ENCOUNTER — Inpatient Hospital Stay (HOSPITAL_COMMUNITY)

## 2024-07-31 DIAGNOSIS — Z7982 Long term (current) use of aspirin: Secondary | ICD-10-CM

## 2024-07-31 DIAGNOSIS — I6389 Other cerebral infarction: Secondary | ICD-10-CM

## 2024-07-31 DIAGNOSIS — I63412 Cerebral infarction due to embolism of left middle cerebral artery: Secondary | ICD-10-CM | POA: Diagnosis not present

## 2024-07-31 DIAGNOSIS — E119 Type 2 diabetes mellitus without complications: Secondary | ICD-10-CM

## 2024-07-31 DIAGNOSIS — R29818 Other symptoms and signs involving the nervous system: Secondary | ICD-10-CM | POA: Diagnosis not present

## 2024-07-31 DIAGNOSIS — I610 Nontraumatic intracerebral hemorrhage in hemisphere, subcortical: Secondary | ICD-10-CM | POA: Diagnosis not present

## 2024-07-31 DIAGNOSIS — G9389 Other specified disorders of brain: Secondary | ICD-10-CM | POA: Diagnosis not present

## 2024-07-31 DIAGNOSIS — R29702 NIHSS score 2: Secondary | ICD-10-CM

## 2024-07-31 DIAGNOSIS — I10 Essential (primary) hypertension: Secondary | ICD-10-CM | POA: Diagnosis not present

## 2024-07-31 DIAGNOSIS — I6782 Cerebral ischemia: Secondary | ICD-10-CM | POA: Diagnosis not present

## 2024-07-31 DIAGNOSIS — I69391 Dysphagia following cerebral infarction: Secondary | ICD-10-CM

## 2024-07-31 LAB — BASIC METABOLIC PANEL WITH GFR
Anion gap: 10 (ref 5–15)
BUN: 19 mg/dL (ref 8–23)
CO2: 20 mmol/L — ABNORMAL LOW (ref 22–32)
Calcium: 8.7 mg/dL — ABNORMAL LOW (ref 8.9–10.3)
Chloride: 109 mmol/L (ref 98–111)
Creatinine, Ser: 0.81 mg/dL (ref 0.44–1.00)
GFR, Estimated: >60 mL/min (ref >60)
Glucose, Bld: 75 mg/dL (ref 70–99)
Potassium: 3.9 mmol/L (ref 3.5–5.1)
Sodium: 139 mmol/L (ref 135–145)

## 2024-07-31 LAB — CBC
HCT: 36.5 % (ref 36.0–46.0)
Hemoglobin: 11.6 g/dL — ABNORMAL LOW (ref 12.0–15.0)
MCH: 25.2 pg — ABNORMAL LOW (ref 26.0–34.0)
MCHC: 31.8 g/dL (ref 30.0–36.0)
MCV: 79.3 fL — ABNORMAL LOW (ref 80.0–100.0)
Platelets: 301 K/uL (ref 150–400)
RBC: 4.6 MIL/uL (ref 3.87–5.11)
RDW: 17 % — ABNORMAL HIGH (ref 11.5–15.5)
WBC: 10.1 K/uL (ref 4.0–10.5)
nRBC: 0 % (ref 0.0–0.2)

## 2024-07-31 LAB — LIPID PANEL
Cholesterol: 100 mg/dL (ref 0–200)
HDL: 36 mg/dL — ABNORMAL LOW (ref >40)
LDL Cholesterol: 47 mg/dL (ref 0–99)
Total CHOL/HDL Ratio: 2.8 ratio
Triglycerides: 85 mg/dL (ref <150)
VLDL: 17 mg/dL (ref 0–40)

## 2024-07-31 LAB — ECHOCARDIOGRAM COMPLETE
Area-P 1/2: 2.48 cm2
Height: 65 in
S' Lateral: 3.3 cm
Weight: 2786.61 [oz_av]

## 2024-07-31 LAB — GLUCOSE, CAPILLARY: Glucose-Capillary: 91 mg/dL (ref 70–99)

## 2024-07-31 MED ORDER — ATORVASTATIN CALCIUM 10 MG PO TABS
10.0000 mg | ORAL_TABLET | Freq: Every day | ORAL | Status: DC
  Administered 2024-07-31 – 2024-08-01 (×2): 10 mg via ORAL
  Filled 2024-07-31 (×2): qty 1

## 2024-07-31 MED ORDER — CLOPIDOGREL BISULFATE 75 MG PO TABS
75.0000 mg | ORAL_TABLET | Freq: Every day | ORAL | Status: DC
  Administered 2024-07-31 – 2024-08-01 (×2): 75 mg via ORAL
  Filled 2024-07-31 (×2): qty 1

## 2024-07-31 MED ORDER — PANTOPRAZOLE SODIUM 40 MG PO TBEC
80.0000 mg | DELAYED_RELEASE_TABLET | Freq: Every day | ORAL | Status: DC
  Administered 2024-07-31 – 2024-08-01 (×2): 80 mg via ORAL
  Filled 2024-07-31 (×2): qty 2

## 2024-07-31 MED ORDER — IRBESARTAN 150 MG PO TABS
150.0000 mg | ORAL_TABLET | Freq: Every day | ORAL | Status: DC
  Administered 2024-07-31 – 2024-08-01 (×2): 150 mg via ORAL
  Filled 2024-07-31 (×2): qty 1

## 2024-07-31 MED ORDER — DORZOLAMIDE HCL-TIMOLOL MAL 2-0.5 % OP SOLN
1.0000 [drp] | Freq: Two times a day (BID) | OPHTHALMIC | Status: DC
  Administered 2024-07-31 – 2024-08-01 (×3): 1 [drp] via OPHTHALMIC
  Filled 2024-07-31: qty 10

## 2024-07-31 MED ORDER — ASPIRIN 81 MG PO TBEC
81.0000 mg | DELAYED_RELEASE_TABLET | Freq: Every day | ORAL | Status: DC
  Administered 2024-07-31 – 2024-08-01 (×2): 81 mg via ORAL
  Filled 2024-07-31 (×2): qty 1

## 2024-07-31 MED ORDER — CHLORHEXIDINE GLUCONATE CLOTH 2 % EX PADS
6.0000 | MEDICATED_PAD | Freq: Every day | CUTANEOUS | Status: DC
  Administered 2024-07-31 – 2024-08-01 (×2): 6 via TOPICAL

## 2024-07-31 MED ORDER — AMLODIPINE BESYLATE 5 MG PO TABS
5.0000 mg | ORAL_TABLET | Freq: Every day | ORAL | Status: DC
  Administered 2024-07-31 – 2024-08-01 (×2): 5 mg via ORAL
  Filled 2024-07-31 (×2): qty 1

## 2024-07-31 NOTE — Evaluation (Signed)
 Occupational Therapy Evaluation Patient Details Name: [name] MRN: [id number] DOB: 06/23/1956 Today's Date: 07/31/2024   History of Present Illness   68 yo female presents to [facility] 11/20 with dysarthria and R facial droop. CTH no acute findings, demonstrates multiple old infarcts; CTA head/neck shows no large vessel occlusion, MRI brain 11/5 showed moderate to advanced small vessel disease w/ possible small acute infarct of L parietal lobe. TNK given 1300 11/20. PMH includes T2DM, HTN, IDA, chronic small vessel disease, tobacco use.     Clinical Impressions [name] was evaluated s/p the above admission list. She lives alone and is indep at baseline. Upon evaluation the pt was limited by RUE dexterity, fine motor and coordination deficits, impaired expressive communication, chronic back pain and decreased activity tolerance. Overall she needs CGA for basic mobility. Due to the deficits listed below the pt also needs min A for most aspects of ADLs due to poor RUE grip strength and dexterity. Pt will benefit from continued acute OT services and HHOT.      If plan is discharge home, recommend the following:   A little help with walking and/or transfers;A little help with bathing/dressing/bathroom;Assistance with cooking/housework;Assist for transportation;Supervision due to cognitive status     Functional Status Assessment   Patient has had a recent decline in their functional status and demonstrates the ability to make significant improvements in function in a reasonable and predictable amount of time.     Equipment Recommendations   None recommended by OT      Precautions/Restrictions   Precautions Precautions: Fall Restrictions Weight Bearing Restrictions Per Provider Order: No     Mobility Bed Mobility Overal bed mobility: Needs Assistance             General bed mobility comments: OOB on arrival    Transfers Overall transfer level: Needs  assistance Equipment used: Rolling walker (2 wheels) Transfers: Sit to/from Stand, Bed to chair/wheelchair/BSC Sit to Stand: Contact guard assist                  Balance Overall balance assessment: Needs assistance Sitting-balance support: No upper extremity supported, Feet supported Sitting balance-Leahy Scale: Fair     Standing balance support: Bilateral upper extremity supported, Reliant on assistive device for balance Standing balance-Leahy Scale: Poor                             ADL either performed or assessed with clinical judgement   ADL Overall ADL's : Needs assistance/impaired Eating/Feeding: Minimal assistance;Sitting Eating/Feeding Details (indicate cue type and reason): would benefit from built up handles Grooming: Minimal assistance;Standing;Sitting   Upper Body Bathing: Minimal assistance   Lower Body Bathing: Minimal assistance   Upper Body Dressing : Minimal assistance   Lower Body Dressing: Minimal assistance   Toilet Transfer: Contact guard assist   Toileting- Clothing Manipulation and Hygiene: Contact guard assist       Functional mobility during ADLs: Contact guard assist General ADL Comments: limited by RUE coordination and strength     Vision Baseline Vision/History: 1 Wears glasses Vision Assessment?: Wears glasses for reading;Wears glasses for driving     Perception Perception: Not tested       Praxis Praxis: Not tested       Pertinent Vitals/Pain Pain Assessment Pain Assessment: Faces Faces Pain Scale: Hurts little more Pain Location: back Pain Descriptors / Indicators: Sore, Discomfort Pain Intervention(s): Limited activity within patient's tolerance, Monitored during session, RN gave  pain meds during session, Patient requesting pain meds-RN notified     Extremity/Trunk Assessment Upper Extremity Assessment Upper Extremity Assessment: Right hand dominant;RUE deficits/detail RUE Deficits / Details: proximal  strength is WFL. gross grasp is 3/5 MMT. dexterity and coordination are impiared. Pt was unable to have a functional grasp on a pen RUE Sensation: decreased light touch RUE Coordination: decreased fine motor   Lower Extremity Assessment Lower Extremity Assessment: Defer to PT evaluation   Cervical / Trunk Assessment Cervical / Trunk Assessment: Kyphotic   Communication Communication Communication: Impaired Factors Affecting Communication: Difficulty expressing self;Reduced clarity of speech   Cognition Arousal: Alert Behavior During Therapy: Flat affect Cognition: Difficult to assess Difficult to assess due to: Impaired communication           OT - Cognition Comments: follows all simple commands                 Following commands: Intact       Cueing  General Comments   Cueing Techniques: Verbal cues  VSS on RA   Exercises     Shoulder Instructions      Home Living Family/patient expects to be discharged to:: Private residence Living Arrangements: Alone Available Help at Discharge: Family;Friend(s) Type of Home: Apartment Home Access: Level entry     Home Layout: Two level;1/2 bath on main level;Bed/bath upstairs Alternate Level Stairs-Number of Steps: 12 Alternate Level Stairs-Rails: Right Bathroom Shower/Tub: Chief Strategy Officer: Standard Bathroom Accessibility: Yes   Home Equipment: Agricultural Consultant (2 wheels);BSC/3in1   Additional Comments: uses BSC as shower seat  Lives With: Alone    Prior Functioning/Environment Prior Level of Function : Independent/Modified Independent             Mobility Comments: walks with RW at baseline ADLs Comments: indep with BADLs, limited IADLs    OT Problem List: Decreased strength;Decreased range of motion;Decreased activity tolerance;Decreased safety awareness;Decreased knowledge of use of DME or AE;Decreased knowledge of precautions   OT Treatment/Interventions: Self-care/ADL  training;Therapeutic exercise;Neuromuscular education;DME and/or AE instruction;Therapeutic activities;Patient/family education;Balance training      OT Goals(Current goals can be found in the care plan section)   Acute Rehab OT Goals Patient Stated Goal: home OT Goal Formulation: With patient Time For Goal Achievement: 08/14/24 Potential to Achieve Goals: Good ADL Goals Pt Will Perform Grooming: with modified independence Pt Will Perform Upper Body Dressing: with modified independence Pt Will Perform Lower Body Dressing: with modified independence Pt Will Transfer to Toilet: with modified independence Pt/caregiver will Perform Home Exercise Program: Increased ROM;Right Upper extremity;With theraputty;With written HEP provided   OT Frequency:  Min 2X/week    Co-evaluation              AM-PAC OT 6 Clicks Daily Activity     Outcome Measure Help from another person eating meals?: A Little Help from another person taking care of personal grooming?: A Little Help from another person toileting, which includes using toliet, bedpan, or urinal?: A Little Help from another person bathing (including washing, rinsing, drying)?: A Little Help from another person to put on and taking off regular upper body clothing?: A Little Help from another person to put on and taking off regular lower body clothing?: A Little 6 Click Score: 18   End of Session Nurse Communication: Mobility status  Activity Tolerance: Patient tolerated treatment well Patient left: in chair;with call bell/phone within reach  Time: 8664-8648 OT Time Calculation (min): 16 min Charges:  OT General Charges $OT Visit: 1 Visit OT Evaluation $OT Eval Moderate Complexity: 1 Mod  Lucie Kendall, OTR/L Acute Rehabilitation Services Office [phone] Secure Chat Communication Preferred   Lucie JONETTA Kendall 07/31/2024, 3:32 PM

## 2024-07-31 NOTE — Progress Notes (Signed)
 OT Cancellation Note  Patient Details Name: [name] MRN: [id number] DOB: [birth year]   Cancelled Treatment:    Reason Eval/Treat Not Completed:  (TNK given 11/20 at 1317 - per protocol OT will hold for 24 hours, unless MD gives approval)  Lucie JONETTA Kendall 07/31/2024, 7:45 AM

## 2024-07-31 NOTE — Progress Notes (Signed)
 STROKE TEAM PROGRESS NOTE    SIGNIFICANT HOSPITAL EVENTS 11/20 1317 TNK administered    INTERIM HISTORY/SUBJECTIVE  68 y.o. female with hx of T2DM, HTN, IDA, chronic small vessel disease, prior tobacco use presents with sudden onset of dysarthria with right facial droop s/p TNK. LKW 1145 11/20.  Sitting up in bed Kristin AM. States feeling better. Still some speech difficulty but coherent short sentences. Facial weakness and dysarthria improved. Eating breakfast. Denies any other neuro deficits.   MRI brain shows patchy left frontal MCA branch infarct  OBJECTIVE  CBC    Component Value Date/Time   WBC 11.8 (H) 07/30/2024 1259   RBC 4.89 07/30/2024 1259   HGB 13.9 07/30/2024 1300   HGB 9.7 (L) 08/06/2023 1227   HGB 12.2 06/05/2023 1606   HGB 14.3 08/08/2007 1444   HCT 41.0 07/30/2024 1300   HCT 41.0 06/05/2023 1606   HCT 43.3 08/08/2007 1444   PLT 341 07/30/2024 1259   PLT 309 08/06/2023 1227   PLT 358 06/05/2023 1606   MCV 82.0 07/30/2024 1259   MCV 79 06/05/2023 1606   MCV 75.4 (L) 08/08/2007 1444   MCH 24.9 (L) 07/30/2024 1259   MCHC 30.4 07/30/2024 1259   RDW 17.0 (H) 07/30/2024 1259   RDW 15.0 06/05/2023 1606   RDW 17.0 (H) 08/08/2007 1444   LYMPHSABS 2.0 07/30/2024 1259   LYMPHSABS 1.8 06/05/2023 1606   LYMPHSABS 1.8 08/08/2007 1444   MONOABS 0.8 07/30/2024 1259   MONOABS 0.7 08/08/2007 1444   EOSABS 0.1 07/30/2024 1259   EOSABS 0.2 06/05/2023 1606   BASOSABS 0.1 07/30/2024 1259   BASOSABS 0.1 06/05/2023 1606   BASOSABS 0.0 08/08/2007 1444    BMET    Component Value Date/Time   NA 142 07/30/2024 1300   NA 140 06/23/2024 1641   K 4.0 07/30/2024 1300   CL 106 07/30/2024 1300   CO2 21 (L) 07/30/2024 1259   GLUCOSE 98 07/30/2024 1300   BUN 27 (H) 07/30/2024 1300   BUN 28 (H) 06/23/2024 1641   CREATININE 1.00 07/30/2024 1300   CREATININE 1.00 08/06/2023 1227   CALCIUM  9.1 07/30/2024 1259   EGFR 50 (L) 06/23/2024 1641   GFRNONAA 59 (L) 07/30/2024 1259    GFRNONAA >60 08/06/2023 1227    IMAGING past 24 hours CT ANGIO HEAD NECK W WO CM (CODE STROKE) Result Date: 07/30/2024 EXAM: CTA HEAD AND NECK WITHOUT AND WITH 07/30/2024 01:06:00 PM TECHNIQUE: CTA of the head and neck was performed without and with the administration of 75 mL of iohexol  (OMNIPAQUE ) 350 MG/ML injection. Multiplanar 2D and/or 3D reformatted images are provided for review. Automated exposure control, iterative reconstruction, and/or weight based adjustment of the mA/kV was utilized to reduce the radiation dose to as low as reasonably achievable. Stenosis of the internal carotid arteries measured using NASCET criteria. COMPARISON: None available CLINICAL HISTORY: Neuro deficit, acute, stroke suspected. FINDINGS: CTA NECK: AORTIC ARCH AND ARCH VESSELS: No dissection or arterial injury. No significant stenosis of the brachiocephalic or subclavian arteries. CERVICAL CAROTID ARTERIES: Minimal atherosclerotic plaque at the carotid bifurcations. No dissection, arterial injury, or hemodynamically significant stenosis by NASCET criteria. CERVICAL VERTEBRAL ARTERIES: Small amount of calcified plaque in the proximal right V1 segment. No dissection, arterial injury, or significant stenosis. LUNGS AND MEDIASTINUM: Unremarkable. SOFT TISSUES: Bilateral thyroid  nodules measuring up to 2.2 cm. BONES: Old left rib fractures. CTA HEAD: ANTERIOR CIRCULATION: The intracranial internal carotid arteries are patent with mild atherosclerosis not resulting in significant stenosis. ACAs and  MCAs are patent without evidence of a proximal branch occlusion or significant proximal stenosis. No aneurysm. POSTERIOR CIRCULATION: The intracranial vertebral arteries are widely patent to the basilar. Patent left PICA, right AICA, and bilateral SCA origins are visualized. The basilar artery is widely patent. There are right larger than left posterior communicating arteries with mild hypoplasia of the right P1 segment. Both PCAs are  patent without evidence of a significant proximal stenosis. No aneurysm. OTHER: No dural venous sinus thrombosis on Kristin non-dedicated study. These results were communicated to Dr. Rosemarie at 1:14 PM on 07/30/2024 by secure text page via the [facility] messaging system. IMPRESSION: 1. Mild atherosclerosis without a large vessel occlusion or significant proximal stenosis in the head or neck. 2. Bilateral thyroid  nodules measuring up to 2.2 cm; recommend non-emergent thyroid  ultrasound. Electronically signed by: Dasie Hamburg MD 07/30/2024 01:26 PM EST RP Workstation: [id number]   CT HEAD CODE STROKE WO CONTRAST Result Date: 07/30/2024 EXAM: CT HEAD WITHOUT 07/30/2024 01:01:37 PM TECHNIQUE: CT of the head was performed without the administration of intravenous contrast. Automated exposure control, iterative reconstruction, and/or weight based adjustment of the mA/kV was utilized to reduce the radiation dose to as low as reasonably achievable. COMPARISON: Head CT 07/13/2024 and MRI 07/15/2024. CLINICAL HISTORY: Neuro deficit, acute, stroke suspected. FINDINGS: BRAIN AND VENTRICLES: There is no evidence of an acute infarct, intracranial hemorrhage, mass, midline shift, hydrocephalus, or extra-axial fluid collection. Tiny chronic right parietal and small to moderate sized chronic left parietal infarcts are again noted. Patchy hypodensities in the cerebral white matter bilaterally are unchanged and nonspecific but compatible with moderate chronic small vessel ischemic disease. There are small chronic bilateral cerebellar infarcts. No age advanced global cerebral atrophy is evident. Calcified atherosclerosis at the skull base. ORBITS: No acute abnormality. Left cataract extraction. SINUSES AND MASTOIDS: No acute abnormality. SOFT TISSUES AND SKULL: No acute skull fracture. No acute soft tissue abnormality. [place] Stroke Program Early CT Score (ASPECTS) ----- Ganglionic (caudate, IC, lentiform nucleus, insula, M1-M3): 7  Supraganglionic (M4-M6): 3 Total: 10 IMPRESSION: 1. No acute intracranial abnormality. ASPECTS of 10. 2. Chronic ischemia with multiple old infarcts. 3. These results were communicated to Dr. Rosemarie at 1:14 PM on 07/30/2024 by secure text page via the [facility] messaging system. Electronically signed by: Dasie Hamburg MD 07/30/2024 01:18 PM EST RP Workstation: [id number]    Vitals:   07/31/24 0900 07/31/24 1100 07/31/24 1154 07/31/24 1200  BP: (!) 159/92 (!) 160/78  (!) 171/88  Pulse: 69 (!) 56  61  Resp: 18 17  19   Temp:   97.9 F (36.6 C)   TempSrc:   Axillary   SpO2: 100% 97%  99%  Weight:      Height:         PHYSICAL EXAM General:  Alert, well-nourished, well-developed [name] in no acute distress Psych:  Mood and affect appropriate for situation CV: Regular rate and rhythm on monitor  Respiratory:  Regular, unlabored respirations on room air  NEURO:  Mental Status: AA&Ox3 Speech/Language: Dysarthria but improved from yesterday. Coherent short sentences.     Cranial Nerves:  II: PERRL. Visual fields full.  III, IV, VI: EOMI. Eyelids elevate symmetrically.  V: Sensation is intact to light touch and symmetrical to face.  VII: Right facial droop w/ asymmetric smile  VIII: hearing intact to voice. IX, X: Palate elevates symmetrically. Phonation is normal.  KP:Dynloizm shrug 5/5. XII: tongue is midline without fasciculations. Motor: 5/5 strength to all muscle groups tested.  Tone: is normal  and bulk is normal Sensation- Intact to light touch bilaterally. Extinction absent to light touch to DSS.   Coordination: FTN and HKS intact bilaterally. No drift.  Gait- deferred  Most Recent NIH 2     ASSESSMENT/PLAN  [name] is a 68 y.o. female with history of T2DM, HTN, IDA, chronic small vessel disease, prior tobacco use presents with sudden onset of dysarthria with right facial droop.  NIH on Admission 4  Acute Ischemic Infarct:  Left precentral gyrus s/p  TNK Etiology:  concern for cryptogenic stroke Code Stroke CT head No acute abnormality. Chronic ischemia w/ old infarcts. ASPECTS 10.    CTA head & neck No LVO or significant stenosis but noted mild atherosclerosis, incidental b/l thyroid  nodules up to 2.2 cm MRI  acute ischemic infarct of L precentral gyrus, chronic small vessel changes, unchanged left parietal encephalomalacia and post-ischemic DWI changes  2D Echo EF 60-65%, no atrial level shunt detected Transcranial bubble study pending Will need ILR at time of discharge vs cardiac monitor, on d/c day will need to consult EP/cards LDL 47 HgbA1c 6.7 VTE prophylaxis - SCD's Start: 07/30/24 1322  aspirin  81 mg daily prior to admission, DAPT after 24 hours post-TNK. Plan for aspirin  81 mg daily and clopidogrel  75 mg daily for 3 weeks and then Plavix  alone. 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 Yes with Assist  Advance diet as tolerated   Hospital day # 1  I have personally obtained history,examined [name], reviewed notes, independently viewed imaging studies, participated in medical decision making and plan of care.ROS completed by me personally and pertinent positives fully documented  I have made any additions or clarifications directly to the above note. Agree with note  above.  [name] presented with sudden onset of dysarthria facial weakness and received IV TNK and is doing well and has shown improvement.  Continue strict blood pressure control and close neurological monitoring as per post TNK protocol.  MRI scan shows embolic left frontal MCA branch infarct.  Continue ongoing stroke workup.  Continue cardiac monitoring for A-fib.  Check TCD bubble study for right-to-left shunt.  She will need prolonged cardiac monitoring at discharge for A-fib.  Mobilize out of bed.  Therapy consults.  Transfer out of ICU to neurology floor bed.  Discussed with [name] and her daughter at the bedside and answered questions [name] is critically ill and at significant risk of neurological worsening, death and care requires constant monitoring of vital signs, hemodynamics,respiratory and cardiac monitoring, extensive review of multiple databases, frequent neurological assessment, discussion with family, other specialists and medical decision making of high complexity.I have made any additions or clarifications directly to the above note.Kristin critical care time does not reflect procedure time, or teaching time or supervisory time of PA/NP/Med Resident etc but could involve care discussion time.  I  spent 30 minutes of neurocritical care time  in the care of  [name].     Eather Popp, MD Medical Director [facility] Pager: [phone] 07/31/2024 3:35 PM   To contact Stroke Continuity provider, please refer to [email or web address]. After hours, contact General Neurology

## 2024-07-31 NOTE — Evaluation (Signed)
 Physical Therapy Evaluation Patient Details Name: [name] MRN: [id number] DOB: 30-Dec-1955 Today's Date: 07/31/2024  History of Present Illness  68 yo female presents to [facility] 11/20 with dysarthria and R facial droop. CTH no acute findings, demonstrates multiple old infarcts; CTA head/neck shows no large vessel occlusion, MRI brain 11/5 showed moderate to advanced small vessel disease w/ possible small acute infarct of L parietal lobe. TNK given 1300 11/20. PMH includes T2DM, HTN, IDA, chronic small vessel disease, tobacco use.  Clinical Impression   Pt presents with generalized weakness, RUE weakness and decreased fine motor vs baseline, impaired balance, significant back pain, and decreased activity tolerance. Pt to benefit from acute PT to address deficits. Pt ambulated  short room distance, requiring close guard for safety, limited by back pain and fatigue. Pt states she feels weaker vs baseline, has a flight of stairs to get to bedroom at home. Recommend increased support from family once d/c home, and HHPT. PT to progress mobility as tolerated, and will continue to follow acutely.           If plan is discharge home, recommend the following: A little help with walking and/or transfers;A little help with bathing/dressing/bathroom   Can travel by private vehicle        Equipment Recommendations None recommended by PT  Recommendations for Other Services       Functional Status Assessment Patient has had a recent decline in their functional status and demonstrates the ability to make significant improvements in function in a reasonable and predictable amount of time.     Precautions / Restrictions Precautions Precautions: Fall Restrictions Weight Bearing Restrictions Per Provider Order: No      Mobility  Bed Mobility Overal bed mobility: Needs Assistance Bed Mobility: Supine to Sit     Supine to sit: Supervision     General bed mobility comments: for safety, slow  to sequence and use of bedrails    Transfers Overall transfer level: Needs assistance Equipment used: Rolling walker (2 wheels) Transfers: Sit to/from Stand, Bed to chair/wheelchair/BSC Sit to Stand: Contact guard assist   Step pivot transfers: Contact guard assist       General transfer comment: for safety, cues for correct hand placement. stand x2, from EOB and recliner.    Ambulation/Gait Ambulation/Gait assistance: Contact guard assist Gait Distance (Feet): 25 Feet Assistive device: Rolling walker (2 wheels) Gait Pattern/deviations: Step-through pattern, Decreased stride length, Trunk flexed Gait velocity: decr     General Gait Details: heavily forward flexed trunk due to back pain, pt stopping and starting multiple times secondary to pain. close guard for safety, cues for not propping on bilat UEs on RW or other surfaces she was passing in room  Stairs            Wheelchair Mobility     Tilt Bed    Modified Rankin (Stroke Patients Only) Modified Rankin (Stroke Patients Only) Pre-Morbid Rankin Score: Slight disability Modified Rankin: Moderately severe disability     Balance Overall balance assessment: Needs assistance Sitting-balance support: No upper extremity supported, Feet supported Sitting balance-Leahy Scale: Fair     Standing balance support: Bilateral upper extremity supported, Reliant on assistive device for balance Standing balance-Leahy Scale: Poor                               Pertinent Vitals/Pain Pain Assessment Pain Assessment: Faces Faces Pain Scale: Hurts little more Pain Location: back Pain  Descriptors / Indicators: Sore, Discomfort Pain Intervention(s): Limited activity within patient's tolerance, Monitored during session, Repositioned    Home Living Family/patient expects to be discharged to:: Private residence Living Arrangements: Alone Available Help at Discharge: Family;Friend(s) Type of Home: Apartment Home  Access: Level entry     Alternate Level Stairs-Number of Steps: 12 Home Layout: Two level;1/2 bath on main level;Bed/bath upstairs Home Equipment: Agricultural Consultant (2 wheels);BSC/3in1 Additional Comments: uses BSC as shower seat    Prior Function Prior Level of Function : Independent/Modified Independent             Mobility Comments: walks with RW at baseline ADLs Comments: indep with BADLs, limited IADLs     Extremity/Trunk Assessment   Upper Extremity Assessment Upper Extremity Assessment: Defer to OT evaluation    Lower Extremity Assessment Lower Extremity Assessment: Generalized weakness (grossly 4/5 throughout)    Cervical / Trunk Assessment Cervical / Trunk Assessment: Kyphotic  Communication   Communication Communication: Impaired Factors Affecting Communication: Reduced clarity of speech    Cognition Arousal: Alert Behavior During Therapy: Flat affect   PT - Cognitive impairments: Problem solving, Safety/Judgement, Difficult to assess Difficult to assess due to: Impaired communication                     PT - Cognition Comments: dysarthric speech, increased processing time and slowed speech on eval Following commands: Intact       Cueing Cueing Techniques: Verbal cues, Gestural cues     General Comments General comments (skin integrity, edema, etc.): HR 40s-70s throughout session, BP 99/88 sitting EOB    Exercises     Assessment/Plan    PT Assessment Patient needs continued PT services  PT Problem List Decreased strength;Decreased balance;Decreased activity tolerance;Pain;Decreased knowledge of use of DME;Decreased mobility;Decreased coordination;Decreased safety awareness       PT Treatment Interventions DME instruction;Therapeutic activities;Gait training;Therapeutic exercise;Patient/family education;Balance training;Functional mobility training;Neuromuscular re-education;Stair training    PT Goals (Current goals can be found in the  Care Plan section)  Acute Rehab PT Goals PT Goal Formulation: With patient Time For Goal Achievement: 08/14/24 Potential to Achieve Goals: Good    Frequency Min 2X/week     Co-evaluation               AM-PAC PT 6 Clicks Mobility  Outcome Measure Help needed turning from your back to your side while in a flat bed without using bedrails?: A Little Help needed moving from lying on your back to sitting on the side of a flat bed without using bedrails?: A Little Help needed moving to and from a bed to a chair (including a wheelchair)?: A Little Help needed standing up from a chair using your arms (e.g., wheelchair or bedside chair)?: A Little Help needed to walk in hospital room?: A Little Help needed climbing 3-5 steps with a railing? : Total 6 Click Score: 16    End of Session Equipment Utilized During Treatment: Gait belt Activity Tolerance: Patient tolerated treatment well;Patient limited by pain Patient left: with call bell/phone within reach;with family/visitor present;in chair;with chair alarm set Nurse Communication: Mobility status PT Visit Diagnosis: Other abnormalities of gait and mobility (R26.89);Muscle weakness (generalized) (M62.81)    Time: 8752-8691 PT Time Calculation (min) (ACUTE ONLY): 21 min   Charges:   PT Evaluation $PT Eval Low Complexity: 1 Low   PT General Charges $$ ACUTE PT VISIT: 1 Visit         Sharolyn Weber S, PT DPT Acute Rehabilitation Services  Secure Chat Preferred  Office [phone]   Larra Crunkleton FOR[place] 07/31/2024, 3:09 PM

## 2024-07-31 NOTE — Evaluation (Signed)
 Speech Language Pathology Evaluation Patient Details Name: [name] MRN: [id number] DOB: 13-Mar-1956 Today's Date: 07/31/2024 Time: 9142-9085 SLP Time Calculation (min) (ACUTE ONLY): 17 min  Problem List:  Patient Active Problem List   Diagnosis Date Noted   Stroke (HCC) 07/30/2024   Cerebral vascular disease 07/23/2024   Frequent headaches 07/09/2024   Acute non-recurrent frontal sinusitis 06/30/2024   Quit smoking within past year 06/30/2024   Vaginal discharge 06/23/2024   Loss of appetite 06/12/2023   Shaky 06/12/2023   Heart burn 06/12/2023   Nausea 06/12/2023   Blood in stool 06/12/2023   Decreased energy 06/12/2023   Persistent proteinuria 06/12/2023   Low grade fever 05/21/2023   Herpes zoster vaccination declined 05/21/2023   Influenza vaccination declined 05/21/2023   COVID-19 vaccination declined 05/21/2023   Hypertensive heart disease without heart failure 05/21/2023   Vitamin D  deficiency 05/21/2023   Tobacco abuse 05/21/2023   Flatulence/gas pain/belching 01/28/2023   Recurrent major depressive disorder, in partial remission 01/16/2023   BMI 36.0-36.9,adult 01/07/2021   CMV (cytomegalovirus) antibody positive 03/02/2020   Iron  deficiency anemia 01/08/2020   Folate deficiency anemia 01/07/2020   Chronic back pain 01/07/2020   Tobacco dependence 01/07/2020   Hypertensive retinopathy of both eyes 02/18/2019   Combined form of age-related cataract, both eyes 02/18/2019   Insomnia 06/08/2018   Essential hypertension 06/08/2018   Type 2 diabetes mellitus with other circulatory complications (HCC) 06/10/2016   Past Medical History:  Past Medical History:  Diagnosis Date   Anemia 01/07/2020   REQUIRING TRANSFUSION   Back pain    Diabetes mellitus without complication (HCC)    GERD (gastroesophageal reflux disease)    Hypertension    Olecranon bursitis of left elbow    Sprain of left ankle 01/16/2023   Traumatic closed displaced fracture of shaft of  right tibia and fibula 06/10/2016   Past Surgical History:  Past Surgical History:  Procedure Laterality Date   ABDOMINAL HYSTERECTOMY     spared ovaries   OLECRANON BURSECTOMY Left 06/19/2017   Procedure: Excision olecranon bursa;  Surgeon: Yvone Rush, MD;  Location: [facility];  Service: Orthopedics;  Laterality: Left;  Panel Length: 60 mins    TIBIA IM NAIL INSERTION Right 06/10/2016   Procedure: INTRAMEDULLARY (IM) NAIL TIBIAL RODING;  Surgeon: Rush Yvone, MD;  Location: [facility] OR;  Service: Orthopedics;  Laterality: Right;   HPI:  [name] is a 68 y.o. female with hx of T2DM, HTN, IDA, chronic small vessel disease, tobacco use presents with sudden onset of dysarthria with right facial droop. Per EMS, had left sided weakness/facial droop then during transit had right facial droop and weakness. MRI brain 11/5 showed moderate to advanced small vessel disease w/ possible small acute infarct of L parietal lobe   Assessment / Plan / Recommendation Clinical Impression  Cognitive-linguistic evaluation complete. Patient presents with CN VII and XII deficits with right sided facial and lingual weakness, resulting in moderate-severe dysarthria, improves with pausing, increased vocal intensity and overarticulation. Occassionally, patient appears to be searching for words however do not feel that she has aphasia based on today's evaluation as with extra time and compensatory strategies above, patient consistently able to produce her desired words. She will benefit from SLP f/u to maximize functional communication.    SLP Assessment  SLP Recommendation/Assessment: Patient needs continued Speech Language Pathology Services SLP Visit Diagnosis: Dysarthria and anarthria (R47.1)     Assistance Recommended at Discharge  Frequent or constant Supervision/Assistance  Functional  Status Assessment Patient has had a recent decline in their functional status and demonstrates the ability  to make significant improvements in function in a reasonable and predictable amount of time.  Frequency and Duration min 2x/week  2 weeks      SLP Evaluation Cognition  Overall Cognitive Status: Within Functional Limits for tasks assessed Orientation Level: Oriented X4       Comprehension  Auditory Comprehension Overall Auditory Comprehension: Appears within functional limits for tasks assessed Visual Recognition/Discrimination Discrimination: Within Function Limits Reading Comprehension Reading Status: Not tested    Expression Expression Primary Mode of Expression: Verbal Verbal Expression Overall Verbal Expression: Appears within functional limits for tasks assessed Written Expression Dominant Hand: Right   Oral / Motor  Oral Motor/Sensory Function Overall Oral Motor/Sensory Function: Severe impairment Facial ROM: Reduced right;Suspected CN VII (facial) dysfunction Facial Symmetry: Abnormal symmetry right;Suspected CN VII (facial) dysfunction Facial Strength: Reduced right;Suspected CN VII (facial) dysfunction Facial Sensation: Within Functional Limits Lingual ROM: Reduced right;Suspected CN XII (hypoglossal) dysfunction Lingual Symmetry: Within Functional Limits Lingual Strength: Reduced;Suspected CN XII (hypoglossal) dysfunction Lingual Sensation: Within Functional Limits Velum: Within Functional Limits Mandible: Within Functional Limits Motor Speech Overall Motor Speech: Impaired Respiration: Within functional limits Phonation: Normal Resonance: Within functional limits Articulation: Impaired Level of Impairment: Word Intelligibility: Intelligibility reduced Word: 25-49% accurate Phrase: 25-49% accurate Sentence: 25-49% accurate Conversation: 25-49% accurate Motor Planning: Within functional limits Motor Speech Errors: Aware Effective Techniques: Slow rate;Over-articulate;Increased vocal intensity;Pause           Rea Pass MA, CCC-SLP  Edmund Holcomb  Meryl 07/31/2024, 9:19 AM

## 2024-07-31 NOTE — Plan of Care (Signed)
  Problem: Education: Goal: Knowledge of disease or condition will improve Outcome: Progressing   Problem: Coping: Goal: Will verbalize positive feelings about self Outcome: Progressing   Problem: Ischemic Stroke/TIA Tissue Perfusion: Goal: Complications of ischemic stroke/TIA will be minimized Outcome: Progressing   Problem: Coping: Goal: Will identify appropriate support needs Outcome: Progressing   Problem: Self-Care: Goal: Verbalization of feelings and concerns over difficulty with self-care will improve Outcome: Progressing   Problem: Self-Care: Goal: Ability to participate in self-care as condition permits will improve Outcome: Progressing   Problem: Nutrition: Goal: Dietary intake will improve Outcome: Progressing   Problem: Nutrition: Goal: Risk of aspiration will decrease Outcome: Progressing   Problem: Self-Care: Goal: Ability to communicate needs accurately will improve Outcome: Progressing

## 2024-07-31 NOTE — Progress Notes (Addendum)
 Handover taken from [employer]. Received from [address] accompanied by RN and visitors. Head-toe assessment, NIH was done. Placed on Tele monitor and SCD was applied.

## 2024-07-31 NOTE — Plan of Care (Signed)
  Problem: Education: Goal: Knowledge of secondary prevention will improve (MUST DOCUMENT ALL) Outcome: Progressing   Problem: Education: Goal: Knowledge of patient specific risk factors will improve (DELETE if not current risk factor) Outcome: Progressing   Problem: Ischemic Stroke/TIA Tissue Perfusion: Goal: Complications of ischemic stroke/TIA will be minimized Outcome: Progressing   Problem: Self-Care: Goal: Verbalization of feelings and concerns over difficulty with self-care will improve Outcome: Progressing   Problem: Self-Care: Goal: Ability to participate in self-care as condition permits will improve Outcome: Progressing

## 2024-08-01 ENCOUNTER — Inpatient Hospital Stay (HOSPITAL_COMMUNITY)

## 2024-08-01 ENCOUNTER — Other Ambulatory Visit: Payer: Self-pay

## 2024-08-01 DIAGNOSIS — I63412 Cerebral infarction due to embolism of left middle cerebral artery: Secondary | ICD-10-CM | POA: Diagnosis not present

## 2024-08-01 DIAGNOSIS — I639 Cerebral infarction, unspecified: Secondary | ICD-10-CM | POA: Diagnosis not present

## 2024-08-01 DIAGNOSIS — I6389 Other cerebral infarction: Secondary | ICD-10-CM

## 2024-08-01 LAB — GLUCOSE, CAPILLARY
Glucose-Capillary: 106 mg/dL — ABNORMAL HIGH (ref 70–99)
Glucose-Capillary: 164 mg/dL — ABNORMAL HIGH (ref 70–99)

## 2024-08-01 LAB — RAPID URINE DRUG SCREEN, HOSP PERFORMED
Amphetamines: NOT DETECTED
Barbiturates: NOT DETECTED
Benzodiazepines: NOT DETECTED
Cocaine: NOT DETECTED
Opiates: NOT DETECTED
Tetrahydrocannabinol: NOT DETECTED

## 2024-08-01 MED ORDER — CLOPIDOGREL BISULFATE 75 MG PO TABS: 75.0000 mg | ORAL_TABLET | Freq: Every day | ORAL | 1 refills | Status: DC

## 2024-08-01 MED ORDER — ASPIRIN 81 MG PO TBEC: 81.0000 mg | DELAYED_RELEASE_TABLET | Freq: Every day | ORAL | 0 refills | Status: AC

## 2024-08-01 NOTE — Progress Notes (Signed)
 VASCULAR LAB    Attempted TCD bubble study, however, unable to find adequate windows.        Graig Hessling, RVT 08/01/2024, 4:50 PM

## 2024-08-01 NOTE — Progress Notes (Signed)
 Pt ready to DC home. VSS. IV lines removed. AVS reviewed with patient, no questions from patient. Dcing home with relative.

## 2024-08-01 NOTE — Progress Notes (Signed)
 VASCULAR LAB    Bilateral lower extremity venous duplex has been performed.  See CV proc for preliminary results.   Krystian Ferrentino, RVT 08/01/2024, 4:50 PM

## 2024-08-01 NOTE — Progress Notes (Signed)
 Physical Therapy Treatment Patient Details Name: [name] MRN: [id number] DOB: 11/25/1955 Today's Date: 08/01/2024   History of Present Illness 68 yo female presents to [facility]20 with dysarthria and R facial droop. CTH no acute findings, demonstrates multiple old infarcts; CTA head/neck shows no large vessel occlusion, MRI brain 11/5 showed moderate to advanced small vessel disease w/ possible small acute infarct of L parietal lobe. TNK given 1300 11/20. PMH includes T2DM, HTN, IDA, chronic small vessel disease, tobacco use.    PT Comments  Pt resting in bed on arrival, eager for mobility and demonstrating steady progress towards acute goals. Session truncated as pt pastor arriving and pt requesting to visit. Pt demonstrating bed mobility at supervision level, transfers sit<>stand with CGA for safety and gait with RW for support with up to min A to maintain balance with turning and negotiating tight spaces. Pt requiring cues throughout session for optimal hand placement and general safety with ambulation distance limited by pt stated fatigue. Current plan remains appropriate to address deficits and maximize functional independence and safety. Pt continues to benefit from skilled PT services to progress toward functional mobility goals.      If plan is discharge home, recommend the following: A little help with walking and/or transfers;A little help with bathing/dressing/bathroom   Can travel by private vehicle        Equipment Recommendations  None recommended by PT    Recommendations for Other Services       Precautions / Restrictions Precautions Precautions: Fall Restrictions Weight Bearing Restrictions Per Provider Order: No     Mobility  Bed Mobility Overal bed mobility: Needs Assistance Bed Mobility: Supine to Sit     Supine to sit: Supervision     General bed mobility comments: supervision for safety with HOB elevated    Transfers Overall transfer level:  Needs assistance Equipment used: Rolling walker (2 wheels) Transfers: Sit to/from Stand, Bed to chair/wheelchair/BSC Sit to Stand: Contact guard assist           General transfer comment: for safety, cues for correct hand placement. stand x2 from EOB    Ambulation/Gait Ambulation/Gait assistance: Contact guard assist, Min assist Gait Distance (Feet): 60 Feet Assistive device: Rolling walker (2 wheels) Gait Pattern/deviations: Step-through pattern, Decreased stride length, Trunk flexed Gait velocity: decr     General Gait Details: close guard for safety, min A to steady with turning and navigating tight spaces in room, cues for not propping on bilat UEs on RW and closer RW proximity, distance limited by pt stated fatigue   Stairs             Wheelchair Mobility     Tilt Bed    Modified Rankin (Stroke Patients Only) Modified Rankin (Stroke Patients Only) Pre-Morbid Rankin Score: Slight disability Modified Rankin: Moderately severe disability     Balance Overall balance assessment: Needs assistance Sitting-balance support: No upper extremity supported, Feet supported Sitting balance-Leahy Scale: Fair     Standing balance support: Bilateral upper extremity supported, Reliant on assistive device for balance Standing balance-Leahy Scale: Poor                              Communication Communication Communication: Impaired Factors Affecting Communication: Difficulty expressing self;Reduced clarity of speech  Cognition Arousal: Alert Behavior During Therapy: WFL for tasks assessed/performed     Difficult to assess due to: Impaired communication  PT - Cognition Comments: dysarthric speech, increased processing time and slowed speech Following commands: Intact      Cueing Cueing Techniques: Verbal cues  Exercises      General Comments General comments (skin integrity, edema, etc.): VSS on RA      Pertinent  Vitals/Pain Pain Assessment Pain Assessment: Faces Faces Pain Scale: Hurts a little bit Pain Location: back Pain Descriptors / Indicators: Sore, Discomfort Pain Intervention(s): Monitored during session, Limited activity within patient's tolerance, Repositioned    Home Living                          Prior Function            PT Goals (current goals can now be found in the care plan section) Acute Rehab PT Goals PT Goal Formulation: With patient Time For Goal Achievement: 08/14/24 Progress towards PT goals: Progressing toward goals    Frequency    Min 2X/week      PT Plan      Co-evaluation              AM-PAC PT 6 Clicks Mobility   Outcome Measure  Help needed turning from your back to your side while in a flat bed without using bedrails?: A Little Help needed moving from lying on your back to sitting on the side of a flat bed without using bedrails?: A Little Help needed moving to and from a bed to a chair (including a wheelchair)?: A Little Help needed standing up from a chair using your arms (e.g., wheelchair or bedside chair)?: A Little Help needed to walk in hospital room?: A Little Help needed climbing 3-5 steps with a railing? : Total 6 Click Score: 16    End of Session Equipment Utilized During Treatment: Gait belt Activity Tolerance: Patient tolerated treatment well;Patient limited by pain Patient left: with call bell/phone within reach;with family/visitor present;in chair;with chair alarm set Nurse Communication: Mobility status PT Visit Diagnosis: Other abnormalities of gait and mobility (R26.89);Muscle weakness (generalized) (M62.81)     Time: 1210-1225 PT Time Calculation (min) (ACUTE ONLY): 15 min  Charges:    $Gait Training: 8-22 mins PT General Charges $$ ACUTE PT VISIT: 1 Visit                     Keynan Heffern R. PTA Acute Rehabilitation Services Office: [phone]   Therisa CHRISTELLA Boor 08/01/2024, 12:41 PM

## 2024-08-01 NOTE — Progress Notes (Signed)
 RNCM received HHPT/OT/SLP order.  RNCM spoke with patient and offered choice for Home Health Services.  Patient states she has no preference, so Joane at [facility] contacted with the order and confirmation received.  Patient in agreement to services.

## 2024-08-01 NOTE — Plan of Care (Signed)
  Problem: Education: Goal: Knowledge of disease or condition will improve 08/01/2024 1802 by Jobie Dena RAMAN, RN Outcome: Adequate for Discharge 08/01/2024 1802 by Jobie Dena RAMAN, RN Outcome: Progressing   Problem: Education: Goal: Knowledge of secondary prevention will improve (MUST DOCUMENT ALL) 08/01/2024 1802 by Jobie Dena RAMAN, RN Outcome: Adequate for Discharge 08/01/2024 1802 by Jobie Dena RAMAN, RN Outcome: Progressing   Problem: Education: Goal: Knowledge of patient specific risk factors will improve (DELETE if not current risk factor) 08/01/2024 1802 by Jobie Dena RAMAN, RN Outcome: Adequate for Discharge 08/01/2024 1802 by Jobie Dena RAMAN, RN Outcome: Progressing   Problem: Ischemic Stroke/TIA Tissue Perfusion: Goal: Complications of ischemic stroke/TIA will be minimized 08/01/2024 1802 by Jobie Dena RAMAN, RN Outcome: Adequate for Discharge 08/01/2024 1802 by Jobie Dena RAMAN, RN Outcome: Progressing

## 2024-08-01 NOTE — Progress Notes (Signed)
 30 day heart monitor ordered for acute CVA. To be mailed to home.  Patient has not been seen by cardiology previously.  Dr. Loni to read, appt with Dr. Acharya made for 09/24/24

## 2024-08-01 NOTE — Discharge Summary (Addendum)
 Stroke Discharge Summary  Patient ID: [name]   MRN: [id number]      DOB: 1956/04/05  Date of Admission: 07/30/2024 Date of Discharge: 08/01/2024  Attending Physician:  Stroke, Md, MD Consultant(s):    None  Patient's PCP:  Georgina Speaks, FNP  DISCHARGE PRIMARY DIAGNOSIS:  Stroke - Acute ischemic infarct of the left precentral gyrus s/p TNK, etiology unclear, concerning for cardioembolic source  Secondary diagnosis Hypertension Hyperlipidemia Diabetes  Allergies as of 08/01/2024   No Known Allergies      Medication List     TAKE these medications    acetaminophen  325 MG tablet Commonly known as: Tylenol  Take 2 tablets (650 mg total) by mouth every 6 (six) hours as needed for up to 30 doses for moderate pain (pain score 4-6), mild pain (pain score 1-3) or headache.   amLODipine  5 MG tablet Commonly known as: NORVASC  TAKE 1 TABLET (5 MG TOTAL) BY MOUTH DAILY.   aspirin  EC 81 MG tablet Take 1 tablet (81 mg total) by mouth daily. Swallow whole. Start taking on: August 02, 2024 What changed: additional instructions   atorvastatin  10 MG tablet Commonly known as: LIPITOR TAKE 1 TABLET (10 MG TOTAL) BY MOUTH DAILY FOR CHOLESTEROL   buPROPion  150 MG 24 hr tablet Commonly known as: WELLBUTRIN  XL TAKE 1 TABLET (150 MG TOTAL) BY MOUTH EVERY MORNING.   clopidogrel  75 MG tablet Commonly known as: PLAVIX  Take 1 tablet (75 mg total) by mouth daily. Start taking on: August 02, 2024   dorzolamide -timolol  2-0.5 % ophthalmic solution Commonly known as: COSOPT  Place 1 drop into both eyes 2 (two) times daily.   Doxepin  HCl 3 MG Tabs Take 1 tablet (3 mg total) by mouth at bedtime as needed.   FeroSul 325 (65 Fe) MG tablet Generic drug: ferrous sulfate  TAKE 1 TABLET (325 MG TOTAL) BY MOUTH 2 (TWO) TIMES DAILY WITH A MEAL. What changed: See the new instructions.   metFORMIN  500 MG tablet Commonly known as: GLUCOPHAGE  Take 1 tablet (500 mg total) by mouth  daily with breakfast.   nortriptyline  10 MG capsule Commonly known as: PAMELOR  Take 2 capsules (20 mg total) by mouth at bedtime.   omeprazole  40 MG capsule Commonly known as: PRILOSEC Take 1 capsule (40 mg total) by mouth daily.   OneTouch Delica Plus Lancet33G Misc USE TO CHECK BLOOD SUGAR ONCE A DAY AS INSTRUCTED   OneTouch Ultra test strip Generic drug: glucose blood USE TO CHECK BLOOD SUGAR ONCE A DAY AS INSTRUCTED   trimethoprim -polymyxin b ophthalmic solution Commonly known as: POLYTRIM Place 1 drop into both eyes See admin instructions. Administer 1 drop into each eye 4 times daily for 2 days after eye injections   valsartan  160 MG tablet Commonly known as: DIOVAN  Take 1 tablet (160 mg total) by mouth daily.   Vitamin D  (Ergocalciferol ) 1.25 MG (50000 UNIT) Caps capsule Commonly known as: DRISDOL  TAKE 1 CAPSULE (50,000 UNITS TOTAL) BY MOUTH EVERY 7 (SEVEN) DAYS. What changed: when to take this       LABORATORY STUDIES CBC    Component Value Date/Time   WBC 10.1 07/31/2024 1143   RBC 4.60 07/31/2024 1143   HGB 11.6 (L) 07/31/2024 1143   HGB 9.7 (L) 08/06/2023 1227   HGB 12.2 06/05/2023 1606   HGB 14.3 08/08/2007 1444   HCT 36.5 07/31/2024 1143   HCT 41.0 06/05/2023 1606   HCT 43.3 08/08/2007 1444   PLT 301 07/31/2024 1143  PLT 309 08/06/2023 1227   PLT 358 06/05/2023 1606   MCV 79.3 (L) 07/31/2024 1143   MCV 79 06/05/2023 1606   MCV 75.4 (L) 08/08/2007 1444   MCH 25.2 (L) 07/31/2024 1143   MCHC 31.8 07/31/2024 1143   RDW 17.0 (H) 07/31/2024 1143   RDW 15.0 06/05/2023 1606   RDW 17.0 (H) 08/08/2007 1444   LYMPHSABS 2.0 07/30/2024 1259   LYMPHSABS 1.8 06/05/2023 1606   LYMPHSABS 1.8 08/08/2007 1444   MONOABS 0.8 07/30/2024 1259   MONOABS 0.7 08/08/2007 1444   EOSABS 0.1 07/30/2024 1259   EOSABS 0.2 06/05/2023 1606   BASOSABS 0.1 07/30/2024 1259   BASOSABS 0.1 06/05/2023 1606   BASOSABS 0.0 08/08/2007 1444   CMP    Component Value Date/Time    NA 139 07/31/2024 1143   NA 140 06/23/2024 1641   K 3.9 07/31/2024 1143   CL 109 07/31/2024 1143   CO2 20 (L) 07/31/2024 1143   GLUCOSE 75 07/31/2024 1143   BUN 19 07/31/2024 1143   BUN 28 (H) 06/23/2024 1641   CREATININE 0.81 07/31/2024 1143   CREATININE 1.00 08/06/2023 1227   CALCIUM  8.7 (L) 07/31/2024 1143   PROT 7.3 07/30/2024 1259   PROT 6.9 06/05/2023 1606   ALBUMIN 3.7 07/30/2024 1259   ALBUMIN 4.0 06/05/2023 1606   AST 12 (L) 07/30/2024 1259   AST 13 (L) 08/06/2023 1227   ALT 11 07/30/2024 1259   ALT 14 08/06/2023 1227   ALKPHOS 61 07/30/2024 1259   BILITOT 0.2 07/30/2024 1259   BILITOT 0.2 08/06/2023 1227   GFRNONAA >60 07/31/2024 1143   GFRNONAA >60 08/06/2023 1227   GFRAA 64 08/11/2020 1109   COAGS Lab Results  Component Value Date   INR 1.0 07/30/2024   INR 1.01 06/10/2016   Lipid Panel    Component Value Date/Time   CHOL 100 07/31/2024 0246   CHOL 126 08/16/2022 1249   TRIG 85 07/31/2024 0246   HDL 36 (L) 07/31/2024 0246   HDL 51 08/16/2022 1249   CHOLHDL 2.8 07/31/2024 0246   VLDL 17 07/31/2024 0246   LDLCALC 47 07/31/2024 0246   LDLCALC 56 08/16/2022 1249   HgbA1C  Lab Results  Component Value Date   HGBA1C 6.7 (H) 06/23/2024   Urine Drug Screen  Drugs of Abuse     Component Value Date/Time   LABOPIA NONE DETECTED 08/01/2024 0317   COCAINSCRNUR NONE DETECTED 08/01/2024 0317   COCAINSCRNUR Negative 05/07/2022 1629   LABBENZ NONE DETECTED 08/01/2024 0317   AMPHETMU NONE DETECTED 08/01/2024 0317   THCU NONE DETECTED 08/01/2024 0317   LABBARB NONE DETECTED 08/01/2024 0317    Alcohol Level    Component Value Date/Time   [facility] <15 07/30/2024 1259   SIGNIFICANT DIAGNOSTIC STUDIES ECHOCARDIOGRAM COMPLETE Result Date: 07/31/2024    ECHOCARDIOGRAM REPORT   Patient Name:   [name] Date of Exam: 07/31/2024 Medical Rec #:  [id number]          Height:       65.0 in Accession #:    [id number]         Weight:       174.2 lb Date of  Birth:  1955/11/04           BSA:          1.865 m Patient Age:    68 years           BP:           158/76  mmHg Patient Gender: F                  HR:           59 bpm. Exam Location:  Inpatient Procedure: 2D Echo, Cardiac Doppler and Color Doppler (Both Spectral and Color            Flow Doppler were utilized during procedure). Indications:    Stroke  History:        Patient has no prior history of Echocardiogram examinations.                 Risk Factors:Diabetes and Hypertension.  Sonographer:    Sherlean Dubin Sonographer#2:  Philomena Daring Referring Phys: [id number] PRAMOD S SETHI IMPRESSIONS  1. Left ventricular ejection fraction, by estimation, is 60 to 65%. The left ventricle has normal function. The left ventricle has no regional wall motion abnormalities. There is mild left ventricular hypertrophy. Left ventricular diastolic parameters are consistent with Grade I diastolic dysfunction (impaired relaxation).  2. Right ventricular systolic function is normal. The right ventricular size is normal.  3. Left atrial size was mildly dilated.  4. The mitral valve is normal in structure. No evidence of mitral valve regurgitation. No evidence of mitral stenosis.  5. Nodular calcification of the right and left cusp tips. The aortic valve is tricuspid. There is mild calcification of the aortic valve. There is mild thickening of the aortic valve. Aortic valve regurgitation is not visualized. Aortic valve sclerosis is present, with no evidence of aortic valve stenosis.  6. The inferior vena cava is normal in size with greater than 50% respiratory variability, suggesting right atrial pressure of 3 mmHg. FINDINGS  Left Ventricle: Left ventricular ejection fraction, by estimation, is 60 to 65%. The left ventricle has normal function. The left ventricle has no regional wall motion abnormalities. Strain was performed and the global longitudinal strain is indeterminate. The left ventricular internal cavity size was normal in size.  There is mild left ventricular hypertrophy. Left ventricular diastolic parameters are consistent with Grade I diastolic dysfunction (impaired relaxation). Right Ventricle: The right ventricular size is normal. No increase in right ventricular wall thickness. Right ventricular systolic function is normal. Left Atrium: Left atrial size was mildly dilated. Right Atrium: Right atrial size was normal in size. Pericardium: Trivial pericardial effusion is present. The pericardial effusion is anterior to the right ventricle. Mitral Valve: The mitral valve is normal in structure. No evidence of mitral valve regurgitation. No evidence of mitral valve stenosis. Tricuspid Valve: The tricuspid valve is normal in structure. Tricuspid valve regurgitation is not demonstrated. No evidence of tricuspid stenosis. Aortic Valve: Nodular calcification of the right and left cusp tips. The aortic valve is tricuspid. There is mild calcification of the aortic valve. There is mild thickening of the aortic valve. Aortic valve regurgitation is not visualized. Aortic valve sclerosis is present, with no evidence of aortic valve stenosis. Pulmonic Valve: The pulmonic valve was normal in structure. Pulmonic valve regurgitation is not visualized. No evidence of pulmonic stenosis. Aorta: The aortic root is normal in size and structure. Venous: The inferior vena cava is normal in size with greater than 50% respiratory variability, suggesting right atrial pressure of 3 mmHg. IAS/Shunts: No atrial level shunt detected by color flow Doppler. Additional Comments: 3D was performed not requiring image post processing on an independent workstation and was indeterminate.  LEFT VENTRICLE PLAX 2D LVIDd:         4.90 cm   Diastology LVIDs:  3.30 cm   LV e' medial:    3.05 cm/s LV PW:         1.10 cm   LV E/e' medial:  19.8 LV IVS:        1.20 cm   LV e' lateral:   3.26 cm/s LVOT diam:     2.11 cm   LV E/e' lateral: 18.6 LV SV:         110 LV SV Index:    59 LVOT Area:     3.50 cm  RIGHT VENTRICLE             IVC RV Basal diam:  3.12 cm     IVC diam: 1.69 cm RV Mid diam:    2.72 cm RV S prime:     18.20 cm/s TAPSE (M-mode): 2.2 cm LEFT ATRIUM             Index        RIGHT ATRIUM           Index LA diam:        3.96 cm 2.12 cm/m   RA Area:     12.50 cm LA Vol (A2C):   52.1 ml 27.93 ml/m  RA Volume:   25.90 ml  13.89 ml/m LA Vol (A4C):   55.4 ml 29.70 ml/m LA Biplane Vol: 54.8 ml 29.38 ml/m  AORTIC VALVE LVOT Vmax:   133.00 cm/s LVOT Vmean:  88.400 cm/s LVOT VTI:    0.314 m  AORTA Ao Root diam: 2.93 cm Ao Asc diam:  2.91 cm MITRAL VALVE                TRICUSPID VALVE MV Area (PHT): 2.48 cm     TR Peak grad:   19.0 mmHg MV Decel Time: 306 msec     TR Vmax:        218.00 cm/s MV E velocity: 60.50 cm/s MV A velocity: 107.00 cm/s  SHUNTS MV E/A ratio:  0.57         Systemic VTI:  0.31 m                             Systemic Diam: 2.11 cm Maude Emmer MD Electronically signed by Maude Emmer MD Signature Date/Time: 07/31/2024/12:03:47 PM    Final    MR BRAIN WO CONTRAST Result Date: 07/31/2024 EXAM: MRI BRAIN WITHOUT CONTRAST 07/31/2024 10:07:54 AM TECHNIQUE: Multiplanar multisequence MRI of the head/brain was performed without the administration of intravenous contrast. COMPARISON: 07/15/2024 CLINICAL HISTORY: Neuro deficit, acute, stroke suspected. FINDINGS: BRAIN AND VENTRICLES: There is an area of abnormal diffusion restriction along the left precentral gyrus, consistent with an acute infarct. Hyperintensity on diffusion-weighted imaging within the posterior left parietal white matter, unchanged. Unchanged scattered chronic microhemorrhages. Remote left basal ganglia hemorrhage. Multifocal hyperintense T2-weighted signal within the cerebral white matter, most commonly due to chronic small vessel disease. Unchanged area of left parietal encephalomalacia. No acute intracranial hemorrhage. No mass. No midline shift. No hydrocephalus. The sella is unremarkable.  Normal flow voids. ORBITS: No acute abnormality. SINUSES AND MASTOIDS: No acute abnormality. BONES AND SOFT TISSUES: Normal marrow signal. No acute soft tissue abnormality. IMPRESSION: 1. Acute ischemic infarct involving the left precentral gyrus. 2. Chronic small vessel ischemic changes. 3. Unchanged left parietal encephalomalacia and post-ischemic DWI changes. Electronically signed by: Franky Stanford MD 07/31/2024 11:08 AM EST RP Workstation: [id number]   CT ANGIO HEAD NECK W WO CM (CODE  STROKE) Result Date: 07/30/2024 EXAM: CTA HEAD AND NECK WITHOUT AND WITH 07/30/2024 01:06:00 PM TECHNIQUE: CTA of the head and neck was performed without and with the administration of 75 mL of iohexol  (OMNIPAQUE ) 350 MG/ML injection. Multiplanar 2D and/or 3D reformatted images are provided for review. Automated exposure control, iterative reconstruction, and/or weight based adjustment of the mA/kV was utilized to reduce the radiation dose to as low as reasonably achievable. Stenosis of the internal carotid arteries measured using NASCET criteria. COMPARISON: None available CLINICAL HISTORY: Neuro deficit, acute, stroke suspected. FINDINGS: CTA NECK: AORTIC ARCH AND ARCH VESSELS: No dissection or arterial injury. No significant stenosis of the brachiocephalic or subclavian arteries. CERVICAL CAROTID ARTERIES: Minimal atherosclerotic plaque at the carotid bifurcations. No dissection, arterial injury, or hemodynamically significant stenosis by NASCET criteria. CERVICAL VERTEBRAL ARTERIES: Small amount of calcified plaque in the proximal right V1 segment. No dissection, arterial injury, or significant stenosis. LUNGS AND MEDIASTINUM: Unremarkable. SOFT TISSUES: Bilateral thyroid  nodules measuring up to 2.2 cm. BONES: Old left rib fractures. CTA HEAD: ANTERIOR CIRCULATION: The intracranial internal carotid arteries are patent with mild atherosclerosis not resulting in significant stenosis. ACAs and MCAs are patent without evidence  of a proximal branch occlusion or significant proximal stenosis. No aneurysm. POSTERIOR CIRCULATION: The intracranial vertebral arteries are widely patent to the basilar. Patent left PICA, right AICA, and bilateral SCA origins are visualized. The basilar artery is widely patent. There are right larger than left posterior communicating arteries with mild hypoplasia of the right P1 segment. Both PCAs are patent without evidence of a significant proximal stenosis. No aneurysm. OTHER: No dural venous sinus thrombosis on this non-dedicated study. These results were communicated to Dr. Rosemarie at 1:14 PM on 07/30/2024 by secure text page via the [facility] messaging system. IMPRESSION: 1. Mild atherosclerosis without a large vessel occlusion or significant proximal stenosis in the head or neck. 2. Bilateral thyroid  nodules measuring up to 2.2 cm; recommend non-emergent thyroid  ultrasound. Electronically signed by: Dasie Hamburg MD 07/30/2024 01:26 PM EST RP Workstation: [id number]   CT HEAD CODE STROKE WO CONTRAST Result Date: 07/30/2024 EXAM: CT HEAD WITHOUT 07/30/2024 01:01:37 PM TECHNIQUE: CT of the head was performed without the administration of intravenous contrast. Automated exposure control, iterative reconstruction, and/or weight based adjustment of the mA/kV was utilized to reduce the radiation dose to as low as reasonably achievable. COMPARISON: Head CT 07/13/2024 and MRI 07/15/2024. CLINICAL HISTORY: Neuro deficit, acute, stroke suspected. FINDINGS: BRAIN AND VENTRICLES: There is no evidence of an acute infarct, intracranial hemorrhage, mass, midline shift, hydrocephalus, or extra-axial fluid collection. Tiny chronic right parietal and small to moderate sized chronic left parietal infarcts are again noted. Patchy hypodensities in the cerebral white matter bilaterally are unchanged and nonspecific but compatible with moderate chronic small vessel ischemic disease. There are small chronic bilateral cerebellar  infarcts. No age advanced global cerebral atrophy is evident. Calcified atherosclerosis at the skull base. ORBITS: No acute abnormality. Left cataract extraction. SINUSES AND MASTOIDS: No acute abnormality. SOFT TISSUES AND SKULL: No acute skull fracture. No acute soft tissue abnormality. [place] Stroke Program Early CT Score (ASPECTS) ----- Ganglionic (caudate, IC, lentiform nucleus, insula, M1-M3): 7 Supraganglionic (M4-M6): 3 Total: 10 IMPRESSION: 1. No acute intracranial abnormality. ASPECTS of 10. 2. Chronic ischemia with multiple old infarcts. 3. These results were communicated to Dr. Rosemarie at 1:14 PM on 07/30/2024 by secure text page via the [facility] messaging system. Electronically signed by: Dasie Hamburg MD 07/30/2024 01:18 PM EST RP Workstation: [id number]   MR  BRAIN WO CONTRAST Result Date: 07/15/2024 EXAM: MRI BRAIN WITHOUT CONTRAST 07/15/2024 10:12:20 PM TECHNIQUE: Multiplanar multisequence MRI of the head/brain was performed without the administration of intravenous contrast. COMPARISON: CT head 07/13/2024 CLINICAL HISTORY: Headaches, vertigo, elevated blood pressure. FINDINGS: BRAIN AND VENTRICLES: Small focus of restricted diffusion on the superomedial margin of the remote left parietal infarct (see series 2, image 34), possibly acute infarct or artifact. No acute intracranial hemorrhage. Sequela of prior hemorrhage in left basal ganglia. No mass. No midline shift. No hydrocephalus. Normal flow voids. Moderate to advanced T2/FLAIR hyperintensities in the periventricular white matter, compatible with chronic microvascular ischemic change. ORBITS: No acute abnormality. SINUSES AND MASTOIDS: No acute abnormality. BONES AND SOFT TISSUES: Normal marrow signal. IMPRESSION: 1. 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 2. No other acute infarct identified. Findings on the same CT head were likely artifactual. 3. Moderate to advanced chronic  microvascular ischemic change. Electronically signed by: Gilmore Molt MD 07/15/2024 10:48 PM EST RP Workstation: [id number]   CT HEAD WO CONTRAST ( ) Result Date: 07/13/2024 EXAM: CT HEAD WITHOUT CONTRAST 07/13/2024 12:04:18 PM TECHNIQUE: CT of the head was performed without the administration of intravenous contrast. Automated exposure control, iterative reconstruction, and/or weight based adjustment of the mA/kV was utilized to reduce the radiation dose to as low as reasonably achievable. COMPARISON: Head CT 11/15/2018. CLINICAL HISTORY: Headache, new onset (Age >= 51y). Dizziness. FINDINGS: BRAIN AND VENTRICLES: A small chronic left parietal cortical infarct is new. Patchy hypodensities in the cerebral white matter bilaterally are largely new and nonspecific but compatible with mild to moderate chronic small vessel ischemic disease. There is apparent asymmetric cortical and subcortical hypoattenuation at the level of the right frontal operculum on axial images raising the possibility of an acute infarct, although this could also be artifact based on reformats. No intracranial hemorrhage, mass, midline shift, hydrocephalus, or extra-axial fluid collection is identified. Calcified atherosclerosis at the skull base. ORBITS: Left cataract extraction. SINUSES: No acute abnormality. SOFT TISSUES AND SKULL: No acute soft tissue abnormality. No skull fracture. IMPRESSION: 1. Possible acute infarct versus artifact involving the right frontal operculum. A brain MRI is recommended for further evaluation 2. No intracranial mass effect or hemorrhage. 3. Progressive chronic small vessel ischemia in the cerebral white matter. Interval chronic left parietal infarct. Electronically signed by: Dasie Hamburg MD 07/13/2024 12:43 PM EST RP Workstation: [id number]    HISTORY OF PRESENT ILLNESS 68 y.o. patient with history of type 2 diabetes mellitus, hypertension, iron  deficiency anemia, chronic small vessel disease, remote  tobacco use was admitted with sudden onset of dysarthria and right facial droop with findings of hypertension on hospital arrival.  Initial NIHSS of 4.  Due to debilitating symptoms, patient was given TNKase  after CTH reviewed.   HOSPITAL COURSE Acute Ischemic Infarct:  Left precentral gyrus s/p TNK Etiology:  concern for cryptogenic stroke Code Stroke CT head No acute abnormality. Chronic ischemia w/ old infarcts. ASPECTS 10.    CTA head & neck No LVO or significant stenosis but noted mild atherosclerosis  MRI  acute ischemic infarct of L precentral gyrus, chronic small vessel changes, unchanged left parietal encephalomalacia and post-ischemic DWI changes  2D Echo EF 60-65%, no atrial level shunt detected Transcranial bubble study no window LE venous Doppler no DVT Consulted cards to request 30 day monitor at discharge.  If negative, may consider loop recorder after LDL 47 HgbA1c 6.7 UDS negative VTE prophylaxis - SCD's  aspirin  81 mg  daily prior to admission now on aspirin  81 mg daily and clopidogrel  75 mg daily for 3 weeks and then Plavix  alone. Therapy recommendations:  Home health PT, OT, SLP Disposition: Home   Hypertension Home meds:  amlodipine  5 mg and valsartan  160 mg  Stable Long-term BP: Normotensive   Hyperlipidemia Home meds:  atorvastatin  10 mg, resumed in hospital LDL 47, goal < 70  No high intensity statin given LDL to goal Continue statin at discharge   Diabetes type II Controlled Home meds:  Metformin  HgbA1c 6.7, goal < 7.0 CBGs Recommend close follow-up with PCP for continued DM control   Other stroke risk factors Advanced age Former smoker  Other acute issues Leukocytosis WBC 11.8--10.1 History of iron  deficiency anemia  DISCHARGE EXAM  PHYSICAL EXAM General:  Alert, well-nourished, well-developed patient in no acute distress Psych:  Mood and affect appropriate for situation CV: Regular rate and rhythm on monitor Respiratory:  Regular, unlabored  respirations on room air GI: Abdomen soft and nontender  NEURO:  Mental Status: AA&Ox3  Speech/Language: speech is dysarthric without aphasia.  Naming, repetition, fluency, and comprehension intact.  Cranial Nerves:  II: PERRL. Visual fields full.  III, IV, VI: EOMI. Eyelids elevate symmetrically.  V: Sensation is intact to light touch and symmetrical to face.  VII: Right facial weakness VIII: hearing intact to voice. IX, X: Phonation is intact KP:Dynloizm shrug 5/5. XII: Tongue protrudes midline Motor: 5/5 strength to all muscle groups tested though slightly reduced grip on the right compared to the left. Tone: is normal and bulk is normal Sensation: Intact to light touch bilaterally. Extinction absent to light touch to DSS. Coordination: FTN intact bilaterally, HKS: no ataxia in BLE.No drift. Impaired rapid alternating movements of the right hand. Gait: Deferred  Discharge Diet       Diet   Diet Carb Modified Fluid consistency: Thin; Room service appropriate? Yes with Assist   liquids  DISCHARGE PLAN Disposition: Home with Home Health, home health PT, OT, and SLP aspirin  81 mg daily and clopidogrel  75 mg daily for secondary stroke prevention for 3 weeks then clopidogrel  75 mg daily alone. Ongoing stroke risk factor control by Primary Care Physician at time of discharge Follow-up PCP Georgina Speaks, FNP in 2 weeks. Follow-up in [facility] in 4-6 weeks, office to schedule an appointment.   35 minutes were spent preparing discharge.  Stevi Toberman, AGACNP-BC [facility] Pager: [phone]  ATTENDING NOTE: I reviewed above note and agree with the assessment and plan. Pt was seen and examined.   Daughters are at the bedside. Pt is awake, alert, eyes open, orientated to age, place, time and people. No aphasia, fluent language, following all simple commands. Able to name and repeat. However, mild to moderate dysarthria.   No gaze palsy, tracking bilaterally, visual field full. R facial droop. Tongue slightly toward R. Bilateral UEs 5/5, no drift, however decreased R hand dexterity. Bilaterally LEs 5/5, no drift. Sensation symmetrical bilaterally, b/l FTN intact but mildly slow on the R, gait not tested.   Patient stroke concerning for cardioembolic source given cortical stroke.  TCD bubble study no window, LE venous Doppler no DVT.  Recommend 30-day CardioNet monitoring as outpatient to rule out A-fib.  If negative, will consider loop recorder on follow-up visit.  Continue DAPT for 3 weeks and then Plavix  alone.  Continue Lipitor.  PT and OT recommend home health therapy.  Patient medically stable for discharge  For detailed assessment and plan,  please refer to above as I have made changes wherever appropriate.   Ary Cummins, MD PhD Stroke Neurology 08/01/2024 4:34 PM

## 2024-08-03 ENCOUNTER — Telehealth: Payer: Self-pay

## 2024-08-03 ENCOUNTER — Other Ambulatory Visit: Payer: Self-pay | Admitting: Nurse Practitioner

## 2024-08-03 ENCOUNTER — Encounter (INDEPENDENT_AMBULATORY_CARE_PROVIDER_SITE_OTHER): Payer: Self-pay

## 2024-08-03 ENCOUNTER — Ambulatory Visit: Payer: Self-pay | Admitting: Nurse Practitioner

## 2024-08-03 ENCOUNTER — Encounter (INDEPENDENT_AMBULATORY_CARE_PROVIDER_SITE_OTHER): Admitting: Ophthalmology

## 2024-08-03 DIAGNOSIS — F3341 Major depressive disorder, recurrent, in partial remission: Secondary | ICD-10-CM

## 2024-08-03 DIAGNOSIS — Z720 Tobacco use: Secondary | ICD-10-CM

## 2024-08-03 NOTE — Transitions of Care (Post Inpatient/ED Visit) (Signed)
   08/03/2024  Name: [name] MRN: [id number] DOB: 19-Apr-1956  Today's TOC FU Call Status: Today's TOC FU Call Status:: Unsuccessful Call (1st Attempt) Unsuccessful Call (1st Attempt) Date: 08/03/24  Attempted to reach the patient regarding the most recent Inpatient/ED visit.  Follow Up Plan: Additional outreach attempts will be made to reach the patient to complete the Transitions of Care (Post Inpatient/ED visit) call.   Alan Ee, RN, BSN, CEN [employer]- Transition of Care Team.  [facility] [phone]

## 2024-08-04 ENCOUNTER — Telehealth: Payer: Self-pay | Admitting: *Deleted

## 2024-08-04 NOTE — Transitions of Care (Post Inpatient/ED Visit) (Signed)
   08/04/2024  Name: [name] MRN: [id number] DOB: [birth year]  Today's TOC FU Call Status: Today's TOC FU Call Status:: Unsuccessful Call (2nd Attempt) Unsuccessful Call (2nd Attempt) Date: 08/04/24  Attempted to reach the patient regarding the most recent Inpatient/ED visit.  Follow Up Plan: Additional outreach attempts will be made to reach the patient to complete the Transitions of Care (Post Inpatient/ED visit) call.   Andrea Dimes RN, BSN [place]  [facility] RN Care Manager [phone]

## 2024-08-05 ENCOUNTER — Telehealth: Payer: Self-pay | Admitting: *Deleted

## 2024-08-05 NOTE — Transitions of Care (Post Inpatient/ED Visit) (Signed)
 08/05/2024  Name: [name] MRN: [id number] DOB: [birth year]  Today's TOC FU Call Status: Today's TOC FU Call Status:: Successful TOC FU Call Completed TOC FU Call Complete Date: 08/05/24  Patient's Name and Date of Birth confirmed. Name, DOB  Transition Care Management Follow-up Telephone Call Date of Discharge: 08/01/24 Discharge Facility: [facility]) Type of Discharge: Inpatient Admission Primary Inpatient Discharge Diagnosis:: Stroke How have you been since you were released from the hospital?: Better Any questions or concerns?: Yes Patient Questions/Concerns:: Patient is unable to get follow up appointment Patient Questions/Concerns Addressed: Other: (RN called PCP/ not appt available until 01/20 2 pm. Patient name will be sent up to front for 1st available since hospital f/u per PCP office)  Items Reviewed: Did you receive and understand the discharge instructions provided?: Yes Medications obtained,verified, and reconciled?: Yes (Medications Reviewed) Any new allergies since your discharge?: No Dietary orders reviewed?: No Do you have support at home?: Yes People in Home [RPT]: alone Name of Support/Comfort Primary Source: sister Cynthis  Medications Reviewed Today: Medications Reviewed Today     Reviewed by Kennieth Cathlean DEL, RN (Case Manager) on 08/05/24 at 1332  Med List Status: <None>   Medication Order Taking? Sig Documenting Provider Last Dose Status Informant  acetaminophen  (TYLENOL ) 325 MG tablet [id number] Yes Take 2 tablets (650 mg total) by mouth every 6 (six) hours as needed for up to 30 doses for moderate pain (pain score 4-6), mild pain (pain score 1-3) or headache. Cottie Donnice PARAS, MD  Active Self, Pharmacy Records  amLODipine  (NORVASC ) 5 MG tablet [id number] Yes TAKE 1 TABLET (5 MG TOTAL) BY MOUTH DAILY. Georgina Speaks, FNP  Active Self, Pharmacy Records  aspirin  EC 81 MG tablet [id number] Yes Take 1 tablet (81 mg total) by mouth daily.  Swallow whole. Toberman, Stevi W, NP  Active   atorvastatin  (LIPITOR) 10 MG tablet [id number] Yes TAKE 1 TABLET (10 MG TOTAL) BY MOUTH DAILY FOR CHOLESTEROL Georgina Speaks, FNP  Active Self, Pharmacy Records  buPROPion  (WELLBUTRIN  XL) 150 MG 24 hr tablet [id number] Yes TAKE 1 TABLET (150 MG TOTAL) BY MOUTH EVERY MORNING. Georgina Speaks, FNP  Active   clopidogrel  (PLAVIX ) 75 MG tablet [id number] Yes Take 1 tablet (75 mg total) by mouth daily. Toberman, Stevi W, NP  Active   dorzolamide -timolol  (COSOPT ) 22.3-6.8 MG/ML ophthalmic solution [id number] Yes Place 1 drop into both eyes 2 (two) times daily. [provider]  Active Self, Pharmacy Records  Doxepin  HCl 3 MG TABS [id number]  Take 1 tablet (3 mg total) by mouth at bedtime as needed.  Patient not taking: Reported on 08/05/2024   Georgina Speaks, FNP  Active Self, Pharmacy Records  FEROSUL 325 [phone] Fe) MG tablet [id number] Yes TAKE 1 TABLET (325 MG TOTAL) BY MOUTH 2 (TWO) TIMES DAILY WITH A MEAL.  Patient taking differently: Take 325 mg by mouth daily.   Georgina Speaks, FNP  Active Self, Pharmacy Records  glucose blood [facility] ULTRA) test strip [id number] Yes USE TO CHECK BLOOD SUGAR ONCE A DAY AS INSTRUCTED Georgina Speaks, FNP  Active Self, Pharmacy Records  Lancets [facility] DELICA PLUS [place]) [place] [id number] Yes USE TO CHECK BLOOD SUGAR ONCE A DAY AS INSTRUCTED Georgina Speaks, FNP  Active Self, Pharmacy Records  metFORMIN  (GLUCOPHAGE ) 500 MG tablet [id number] Yes Take 1 tablet (500 mg total) by mouth daily with breakfast. Georgina Speaks, FNP  Active Self, Pharmacy Records  nortriptyline  (PAMELOR ) 10 MG capsule [id number] Yes Take 2 capsules (20 mg total) by  mouth at bedtime. Onita Duos, MD  Active Self, Pharmacy Records  omeprazole  [facility]) 40 MG capsule [id number] Yes Take 1 capsule (40 mg total) by mouth daily. Federico Rosario BROCKS, MD  Active Self, Pharmacy Records  trimethoprim -polymyxin b [facility]) ophthalmic solution [id number] Yes Place 1 drop into  both eyes See admin instructions. Administer 1 drop into each eye 4 times daily for 2 days after eye injections [provider]  Active Self, Pharmacy Records  valsartan  (DIOVAN ) 160 MG tablet [id number] Yes Take 1 tablet (160 mg total) by mouth daily. Georgina Speaks, FNP  Active Self, Pharmacy Records  Vitamin D , Ergocalciferol , (DRISDOL ) 1.25 MG (50000 UNIT) CAPS capsule [id number] Yes TAKE 1 CAPSULE (50,000 UNITS TOTAL) BY MOUTH EVERY 7 (SEVEN) DAYS.  Patient taking differently: Take 50,000 Units by mouth every Sunday.   Georgina Speaks, FNP  Active Self, Pharmacy Records            Home Care and Equipment/Supplies: Were Home Health Services Ordered?: NA Any new equipment or medical supplies ordered?: NA  Functional Questionnaire: Do you need assistance with bathing/showering or dressing?: No Do you need assistance with meal preparation?: No Do you need assistance with eating?: No Do you have difficulty maintaining continence: No Do you need assistance with getting out of bed/getting out of a chair/moving?: Yes (uses a rollator walker) Do you have difficulty managing or taking your medications?: No  Follow up appointments reviewed: PCP Follow-up appointment confirmed?: Yes Date of PCP follow-up appointment?: 09/29/24 Follow-up Provider: Speaks Georgina Specialist [facility] Follow-up appointment confirmed?: No Reason Specialist Follow-Up Not Confirmed: Patient has Specialist Provider Number and will Call for Appointment (RN called specialist for appt. Office closed until Mon 12/01) Do you need transportation to your follow-up appointment?: No Do you understand care options if your condition(s) worsen?: Yes-patient verbalized understanding  SDOH Interventions Today    Flowsheet Row Most Recent Value  SDOH Interventions   Food Insecurity Interventions Intervention Not Indicated  Housing Interventions Intervention Not Indicated    Goals Addressed             This Visit's  Progress    [facility] Transitions of Care (TOC) Care Plan       Problems:  Recent Hospitalization for treatment of Stroke Knowledge Deficit Related to Stroke and No Hospital Follow Up Provider appointment available until Jan. Patient will be placed on list for earlier call  Goal:  Over the next 30 days, the patient will not experience hospital readmission  Interventions:   Stroke: Reviewed Importance of taking all medications as prescribed Reviewed Importance of attending all scheduled provider appointments Assessed social determinant of health barriers Assessed for signs and symptoms of stroke Assessed for fall status and safety in the home  Patient Self Care Activities:  Attend all scheduled provider appointments Call pharmacy for medication refills 3-7 days in advance of running out of medications Call provider office for new concerns or questions  Notify RN Care Manager of TOC call rescheduling needs Participate in Transition of Care Program/Attend TOC scheduled calls Take medications as prescribed    Plan:  An initial telephone outreach has been scheduled for: 08/13/2024 Next PCP appointment scheduled for: [id number] 2:00/ PCP office will patient on list for earlier follow up outreach call.  Telephone follow up appointment with care management team member scheduled for:  Andrea Dimes 08/13/2024 11:00 RN will reach out to neurologist to assist patient to schedule an appointment       Discussed and offered 30 day TOC program.  Patient accepted .  The patient has been provided with contact information for the care management team and has been advised to call with any health -related questions or concerns.  The patient verbalized understanding with current plan of care.  The patient is directed to their insurance card regarding availability of benefits coverage   Cathlean Headland BSN RN [facility] Care Management Coordinator [email or web address] Direct  Dial: [phone]  Fax: [phone] Website: [email or web address]

## 2024-08-05 NOTE — Transitions of Care (Post Inpatient/ED Visit) (Signed)
   08/05/2024  Name: [name] MRN: [id number] DOB: 03-02-1956  Today's TOC FU Call Status: Today's TOC FU Call Status:: Unsuccessful Call (3rd Attempt) Unsuccessful Call (3rd Attempt) Date: 08/05/24  Attempted to reach the patient regarding the most recent Inpatient/ED visit.  Follow Up Plan: No further outreach attempts will be made at this time. We have been unable to contact the patient.  Cathlean Headland BSN RN [facility] Care Management Coordinator [email or web address] Direct Dial: [phone]  Fax: [phone] Website: [email or web address]

## 2024-08-10 ENCOUNTER — Telehealth: Payer: Self-pay

## 2024-08-10 ENCOUNTER — Telehealth: Admitting: Nurse Practitioner

## 2024-08-10 ENCOUNTER — Encounter: Payer: Self-pay | Admitting: Nurse Practitioner

## 2024-08-10 DIAGNOSIS — I69328 Other speech and language deficits following cerebral infarction: Secondary | ICD-10-CM | POA: Diagnosis not present

## 2024-08-10 DIAGNOSIS — Z7984 Long term (current) use of oral hypoglycemic drugs: Secondary | ICD-10-CM | POA: Diagnosis not present

## 2024-08-10 DIAGNOSIS — E1159 Type 2 diabetes mellitus with other circulatory complications: Secondary | ICD-10-CM | POA: Diagnosis not present

## 2024-08-10 DIAGNOSIS — I63412 Cerebral infarction due to embolism of left middle cerebral artery: Secondary | ICD-10-CM

## 2024-08-10 DIAGNOSIS — Z09 Encounter for follow-up examination after completed treatment for conditions other than malignant neoplasm: Secondary | ICD-10-CM

## 2024-08-10 MED ORDER — LANCET DEVICE MISC: 1.0000 | Freq: Three times a day (TID) | 0 refills | Status: AC

## 2024-08-10 MED ORDER — BLOOD GLUCOSE MONITORING SUPPL DEVI: 1.0000 | Freq: Three times a day (TID) | 0 refills | Status: AC

## 2024-08-10 MED ORDER — BLOOD GLUCOSE TEST VI STRP: 1.0000 | ORAL_STRIP | Freq: Three times a day (TID) | 2 refills | Status: AC

## 2024-08-10 MED ORDER — LANCETS MISC: 1.0000 | 0 refills | Status: AC

## 2024-08-10 NOTE — Progress Notes (Addendum)
 LILLETTE Kristeen JINNY Gladis, CMA,acting as a neurosurgeon for Kristin Ada, FNP.,have documented all relevant documentation on the behalf of Kristin Ada, FNP,as directed by  Kristin Ada, FNP while in the presence of Kristin Ada, FNP.  I connected with  LILIANNE DELAIR on 08/10/2024 by a video enabled telemedicine application and verified that I am speaking with the correct person using two identifiers.   I discussed the limitations of evaluation and management by telemedicine. The patient expressed understanding and agreed to proceed.  Patient Location: Home Provider Location: Home Office   Subjective:  Patient ID: [name] , female    DOB: Mar 28, 1956 , 68 y.o.   MRN: [id number]  Chief Complaint  Patient presents with   Hospitalization Follow-up    Patient presents today for a hospital follow up, patient reports compliance with medications. Patient denies any chest pain, SOB, or headaches. Patient was admitted 07/30/2024 and discharged on 08/01/2024. Patient reports today they are feeling about the same.  Patient was diagnosed with a Stroke    Per hospital discharge summary , She went to the ER with sudden onset of dysarthria and right facial droop with findings of hypertension on hospital arrival.  Initial NIHSS of 4.  Due to debilitating symptoms, patient was given TNKase  after CTH reviewed. She was at work and was unable to speak. She has left facial droop. She is using a rolling walker still. She has been staying downstairs due to having weakness in getting upstairs Her nephew has been taking care of her. She was started on plavix  and aspirin  for 3 weeks then will only take plavix . She will be getting speech therapy/OT/PT at home      Past Medical History:  Diagnosis Date   Allergy    Anemia 01/07/2020   REQUIRING TRANSFUSION   Back pain    Depression    Diabetes mellitus without complication (HCC)    GERD (gastroesophageal reflux disease)    Hypertension    Myocardial infarction  (HCC)    Olecranon bursitis of left elbow    Sprain of left ankle 01/16/2023   Traumatic closed displaced fracture of shaft of right tibia and fibula 06/10/2016     Family History  Problem Relation Age of Onset   Diabetes Mother    Hypertension Mother    Diabetes Father    Hypertension Father    Breast cancer Sister    Breast cancer Sister    Breast cancer Paternal Aunt    Esophageal cancer Neg Hx    Liver disease Neg Hx    Colon cancer Neg Hx    Migraines Neg Hx    Stroke Neg Hx      Current Outpatient Medications:    acetaminophen  (TYLENOL ) 325 MG tablet, Take 2 tablets (650 mg total) by mouth every 6 (six) hours as needed for up to 30 doses for moderate pain (pain score 4-6), mild pain (pain score 1-3) or headache., Disp: 30 tablet, Rfl: 0   amLODipine  (NORVASC ) 5 MG tablet, TAKE 1 TABLET (5 MG TOTAL) BY MOUTH DAILY., Disp: 90 tablet, Rfl: 1   aspirin  EC 81 MG tablet, Take 1 tablet (81 mg total) by mouth daily. Swallow whole., Disp: 21 tablet, Rfl: 0   atorvastatin  (LIPITOR) 10 MG tablet, TAKE 1 TABLET (10 MG TOTAL) BY MOUTH DAILY FOR CHOLESTEROL, Disp: 90 tablet, Rfl: 1   Blood Glucose Monitoring Suppl DEVI, 1 each by Does not apply route in the morning, at noon, and at bedtime. May substitute to  any manufacturer covered by at[employer]., Disp: 1 each, Rfl: 0   buPROPion  (WELLBUTRIN  XL) 150 MG 24 hr tablet, TAKE 1 TABLET (150 MG TOTAL) BY MOUTH EVERY MORNING., Disp: 30 tablet, Rfl: 2   clopidogrel  (PLAVIX ) 75 MG tablet, Take 1 tablet (75 mg total) by mouth daily., Disp: 30 tablet, Rfl: 1   dorzolamide -timolol  (COSOPT ) 22.3-6.8 MG/ML ophthalmic solution, Place 1 drop into both eyes 2 (two) times daily., Disp: , Rfl:    FEROSUL 325 (65 Fe) MG tablet, TAKE 1 TABLET (325 MG TOTAL) BY MOUTH 2 (TWO) TIMES DAILY WITH A MEAL., Disp: 60 tablet, Rfl: 3   Glucose Blood (BLOOD GLUCOSE TEST STRIPS) STRP, 1 each by In Vitro route in the morning, at noon, and at bedtime. May substitute  to any manufacturer covered by patient's insurance., Disp: 100 strip, Rfl: 2   Lancet Device MISC, 1 each by Does not apply route in the morning, at noon, and at bedtime. May substitute to any manufacturer covered by patient's insurance., Disp: 1 each, Rfl: 0   Lancets (ONETOUCH DELICA PLUS LANCET33G) MISC, USE TO CHECK BLOOD SUGAR ONCE A DAY AS INSTRUCTED, Disp: 100 each, Rfl: 2   Lancets MISC, 1 each by Does not apply route as directed. Dispense based on patient and insurance preference. Use up to four times daily as directed. (FOR ICD-10 E10.9, E11.9)., Disp: 100 each, Rfl: 0   metFORMIN  (GLUCOPHAGE ) 500 MG tablet, Take 1 tablet (500 mg total) by mouth daily with breakfast., Disp: 90 tablet, Rfl: 1   nortriptyline  (PAMELOR ) 10 MG capsule, Take 2 capsules (20 mg total) by mouth at bedtime., Disp: 60 capsule, Rfl: 11   omeprazole  (PRILOSEC) 40 MG capsule, Take 1 capsule (40 mg total) by mouth daily., Disp: 30 capsule, Rfl: 6   trimethoprim -polymyxin b (POLYTRIM) ophthalmic solution, Place 1 drop into both eyes See admin instructions. Administer 1 drop into each eye 4 times daily for 2 days after eye injections, Disp: , Rfl:    valsartan  (DIOVAN ) 160 MG tablet, Take 1 tablet (160 mg total) by mouth daily., Disp: 90 tablet, Rfl: 2   Vitamin D , Ergocalciferol , (DRISDOL ) 1.25 MG (50000 UNIT) CAPS capsule, TAKE 1 CAPSULE (50,000 UNITS TOTAL) BY MOUTH EVERY 7 (SEVEN) DAYS. (Patient taking differently: Take 50,000 Units by mouth every Sunday.), Disp: 12 capsule, Rfl: 0   Doxepin  HCl 3 MG TABS, Take 1 tablet (3 mg total) by mouth at bedtime as needed. (Patient not taking: Reported on 08/21/2024), Disp: 30 tablet, Rfl: 3   No Known Allergies   Review of Systems  Constitutional: Negative.   Respiratory: Negative.    Cardiovascular: Negative.   Neurological: Negative.        Slurred speech  Psychiatric/Behavioral: Negative.       There were no vitals filed for this visit. There is no height or weight  on file to calculate BMI.  Wt Readings from Last 3 Encounters:  07/30/24 174 lb 2.6 oz (79 kg)  07/23/24 175 lb (79.4 kg)  07/15/24 171 lb (77.6 kg)      Objective:  Physical Exam Vitals and nursing note reviewed.  Constitutional:      General: She is not in acute distress.    Appearance: Normal appearance.  HENT:     Head: Normocephalic and atraumatic.     Comments: Pain to frontal area of head bilaterally Cardiovascular:     Rate and Rhythm: Normal rate and regular rhythm.  Pulmonary:     Effort: Pulmonary effort is  normal. No respiratory distress.  Musculoskeletal:     Comments: Rollator walker  Neurological:     Mental Status: She is alert and oriented to person, place, and time.     Cranial Nerves: Dysarthria present.  Psychiatric:        Mood and Affect: Mood normal.        Speech: Speech is slurred.        Behavior: Behavior normal.        Thought Content: Thought content normal.        Judgment: Judgment normal.         Assessment And Plan:   Assessment & Plan Hospital discharge follow-up TCM Performed. A member of the clinical team spoke with the patient upon dischare. Discharge summary was reviewed in full detail during the visit. Meds reconciled and compared to discharge meds. Medication list is updated and reviewed with the patient.  Greater than 50% face to face time was spent in counseling an coordination of care.  All questions were answered to the satisfaction of the patient.    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 She is to follow up with Neurology. Continue with OT/PT/speech.  Type 2 diabetes mellitus with other circulatory complications (HCC) Refill for glucometer sent  Orders Placed This Encounter  Procedures   Ambulatory referral to Neurology     No follow-ups on file.   Patient was given opportunity to ask questions. Patient  verbalized understanding of the plan and was able to repeat key elements of the plan. All questions were answered to their satisfaction.   LILLETTE Kristin Ada, FNP, have reviewed all documentation for this visit. The documentation on 08/10/2024 for the exam, diagnosis, procedures, and orders are all accurate and complete.    IF YOU HAVE BEEN REFERRED TO A SPECIALIST, IT MAY TAKE 1-2 WEEKS TO SCHEDULE/PROCESS THE REFERRAL. IF YOU HAVE NOT HEARD FROM US /SPECIALIST IN TWO WEEKS, PLEASE GIVE US  A CALL AT [phone] X [phone].

## 2024-08-10 NOTE — Telephone Encounter (Signed)
 Copied from CRM #[id number]. Topic: Clinical - Medication Question >> Aug 04, 2024 11:16 AM Selinda RAMAN wrote: Reason for CRM: Despina the physical therapist with [facility] called in stating the patient is in need of a glucometer with strips to test her sugar. He also wants to report an interaction between medications clopidogrel  (PLAVIX ) 75 MG tablet and omeprazole  (PRILOSEC) 40 MG capsule. Lastly he wants the provider to know she stopped taking the Doxepin  HCl 3 MG TABS stating she doesn't like it. Please assist patient further

## 2024-08-11 ENCOUNTER — Telehealth: Payer: Self-pay | Admitting: Internal Medicine

## 2024-08-11 NOTE — Telephone Encounter (Signed)
 Patient had a stroke recently and received her heart monitor in the mail. Patient would like to have help applying the monitor. Please advise.

## 2024-08-13 ENCOUNTER — Other Ambulatory Visit: Payer: Self-pay | Admitting: *Deleted

## 2024-08-13 NOTE — Patient Instructions (Signed)
 Visit Information  Thank you for taking time to visit with me today. Please don't hesitate to contact me if I can be of assistance to you before our next scheduled telephone appointment.   Following is a copy of your care plan:   Goals Addressed             This Visit's Progress    [facility]I Transitions of Care (TOC) Care Plan       Problems:  Recent Hospitalization for treatment of Stroke Knowledge Deficit Related to Stroke and No Hospital Follow Up Provider appointment available until Jan. Patient will be placed on list for earlier call  Goal:  Over the next 30 days, the patient will not experience hospital readmission  Interventions:  Stroke: Reviewed Importance of taking all medications as prescribed Reviewed Importance of attending all scheduled provider appointments Screening for signs and symptoms of depression related to chronic disease state Assessed for signs and symptoms of stroke Assessed for fall status and safety in the home Discussed upcoming appointments including:Retina Specialist on 08/14/24, Cardiac Imaging on 08/27/24, Cardiology on 09/24/24 and Neurology on 09/25/24 Patient will ask her sister to help set up Cardiac Monitor and glucometer tomorrow when she visits [facility] PT/OT/SLP 1-2 times weekly for each support Patient will have HH Aide twice weekly for 4 hours/day [facility] SW has arranged meal delivery through [employer]) Reviewed PCP note from 08/10/24  Patient Self Care Activities:  Attend all scheduled provider appointments Call pharmacy for medication refills 3-7 days in advance of running out of medications Call provider office for new concerns or questions  Notify RN Care Manager of [facility] call rescheduling needs Participate in Transition of Care Program/Attend TOC scheduled calls Take medications as prescribed    Plan:  Telephone follow up appointment with care management team member scheduled for:   08/21/2024 at 11:00am          Patient verbalizes understanding of instructions and care plan provided today and agrees to view in MyChart. Active MyChart status and patient understanding of how to access instructions and care plan via MyChart confirmed with patient.     Telephone follow up appointment with care management team member scheduled for:08/21/24 at 11am  Please call the care guide team at [phone] if you need to cancel or reschedule your appointment.   Please call 1-800-273-TALK (toll free, 24 hour hotline) go to [facility] [address], [place] [phone]) call 911 if you are experiencing a Mental Health or Behavioral Health Crisis or need someone to talk to.  Andrea Dimes RN, BSN [place]  [facility] RN Care Manager [phone]

## 2024-08-13 NOTE — Transitions of Care (Post Inpatient/ED Visit) (Signed)
 Transition of Care week 2  Visit Note  08/13/2024  Name: [name] MRN: [id number]          DOB: 1955-12-10  Situation: Patient enrolled in [facility] 30-day program. Visit completed with Ms. Sherk by telephone.   Background:   Initial Transition Care Management Follow-up Telephone Call Discharge Date and Diagnosis: 08/01/24, Stroke   Past Medical History:  Diagnosis Date   Allergy    Anemia 01/07/2020   REQUIRING TRANSFUSION   Back pain    Depression    Diabetes mellitus without complication (HCC)    GERD (gastroesophageal reflux disease)    Hypertension    Myocardial infarction (HCC)    Olecranon bursitis of left elbow    Sprain of left ankle 01/16/2023   Traumatic closed displaced fracture of shaft of right tibia and fibula 06/10/2016    Assessment: Patient Reported Symptoms: Cognitive Cognitive Status: Able to follow simple commands, Alert and oriented to person, place, and time      Neurological Neurological Review of Symptoms: Headaches Neurological Management Strategies: Adequate rest, Coping strategies, Medication therapy, Routine screening Neurological Self-Management Outcome: 3 (uncertain) Neurological Comment: Patient reports constant headache since October. Patient scheduled with Neurology on 09/26/23  HEENT HEENT Symptoms Reported: No symptoms reported HEENT Comment: Upcoming appointment with Retina Specialist on 08/14/24    Cardiovascular Cardiovascular Symptoms Reported: No symptoms reported Cardiovascular Comment: BP today 137/78. Scheduled for Cardiac testing on 08/27/24 and Cardiology 09/24/24  Respiratory Respiratory Symptoms Reported: No symptoms reported    Endocrine Endocrine Symptoms Reported: No symptoms reported Is patient diabetic?: Yes Is patient checking blood sugars at home?: No Endocrine Self-Management Outcome: 4 (good) Endocrine Comment: Recent A1C 6.7  Gastrointestinal Gastrointestinal Symptoms Reported: Constipation Additional  Gastrointestinal Details: LBM 08/12/24 Gastrointestinal Management Strategies: Coping strategies, Medication therapy, Diet modification Gastrointestinal Self-Management Outcome: 3 (uncertain)    Genitourinary Genitourinary Symptoms Reported: No symptoms reported    Integumentary Integumentary Symptoms Reported: No symptoms reported    Musculoskeletal Musculoskelatal Symptoms Reviewed: Weakness, Unsteady gait Musculoskeletal Management Strategies: Adequate rest, Coping strategies, Medication therapy, Routine screening Musculoskeletal Self-Management Outcome: 3 (uncertain) Musculoskeletal Comment: HH for PT/OT and SLP. Using rollator for ambulation. Falls in the past year?: No    Psychosocial Psychosocial Symptoms Reported: No symptoms reported Additional Psychological Details: I just pray     Quality of Family Relationships: helpful, involved, supportive Do you feel physically threatened by others?: No   Today's Vitals   08/13/24 1126  BP: 137/78   Pain Score: 7  Pain Type: Chronic pain Pain Location: Head Pain Onset: On-going Pain Intervention(s): Hot/Cold interventions (advised taking tylenol  and applying cold compress to forehead and back of neck)  Medications Reviewed Today     Reviewed by Lucky Andrea LABOR, RN (Registered Nurse) on 08/13/24 at 1114  Med List Status: <None>   Medication Order Taking? Sig Documenting Provider Last Dose Status Informant  acetaminophen  (TYLENOL ) 325 MG tablet [id number] Yes Take 2 tablets (650 mg total) by mouth every 6 (six) hours as needed for up to 30 doses for moderate pain (pain score 4-6), mild pain (pain score 1-3) or headache. Cottie Donnice PARAS, MD  Active Self, Pharmacy Records  amLODipine  (NORVASC ) 5 MG tablet [id number] Yes TAKE 1 TABLET (5 MG TOTAL) BY MOUTH DAILY. Georgina Speaks, FNP  Active Self, Pharmacy Records  aspirin  EC 81 MG tablet [id number] Yes Take 1 tablet (81 mg total) by mouth daily. Swallow whole. Toberman, Stevi W, NP   Active   atorvastatin  (LIPITOR) 10 MG  tablet [id number] Yes TAKE 1 TABLET (10 MG TOTAL) BY MOUTH DAILY FOR CHOLESTEROL Georgina Speaks, FNP  Active Self, Pharmacy Records  Blood Glucose Monitoring Suppl DEVI [id number]  1 each by Does not apply route in the morning, at noon, and at bedtime. May substitute to any manufacturer covered by patient's insurance.  Patient not taking: Reported on 08/13/2024   Georgina Speaks, FNP  Active   buPROPion  (WELLBUTRIN  XL) 150 MG 24 hr tablet [id number] Yes TAKE 1 TABLET (150 MG TOTAL) BY MOUTH EVERY MORNING. Georgina Speaks, FNP  Active   clopidogrel  (PLAVIX ) 75 MG tablet [id number] Yes Take 1 tablet (75 mg total) by mouth daily. Toberman, Stevi W, NP  Active   dorzolamide -timolol  (COSOPT ) 22.3-6.8 MG/ML ophthalmic solution [id number] Yes Place 1 drop into both eyes 2 (two) times daily. [provider]  Active Self, Pharmacy Records  Doxepin  HCl 3 MG TABS [id number]  Take 1 tablet (3 mg total) by mouth at bedtime as needed.  Patient not taking: Reported on 08/13/2024   Georgina Speaks, FNP  Active Self, Pharmacy Records  FEROSUL 325 [phone] Fe) MG tablet [id number] Yes TAKE 1 TABLET (325 MG TOTAL) BY MOUTH 2 (TWO) TIMES DAILY WITH A MEAL. Georgina Speaks, FNP  Active Self, Pharmacy Records  Glucose Blood (BLOOD GLUCOSE TEST STRIPS) STRP [id number]  1 each by In Vitro route in the morning, at noon, and at bedtime. May substitute to any manufacturer covered by patient's insurance.  Patient not taking: Reported on 08/13/2024   Georgina Speaks, FNP  Active   Lancet Device MISC [id number]  1 each by Does not apply route in the morning, at noon, and at bedtime. May substitute to any manufacturer covered by patient's insurance.  Patient not taking: Reported on 08/13/2024   Georgina Speaks, FNP  Active   Lancets [facility] CATHRYNE PLUS [place]) MISC [id number]  USE TO CHECK BLOOD SUGAR ONCE A DAY AS INSTRUCTED Georgina Speaks, FNP  Active Self, Pharmacy Records  Lancets MISC [id number]  1 each  by Does not apply route as directed. Dispense based on patient and insurance preference. Use up to four times daily as directed. (FOR ICD-10 E10.9, E11.9).  Patient not taking: Reported on 08/13/2024   Georgina Speaks, FNP  Active   metFORMIN  (GLUCOPHAGE ) 500 MG tablet [id number] Yes Take 1 tablet (500 mg total) by mouth daily with breakfast. Georgina Speaks, FNP  Active Self, Pharmacy Records  nortriptyline  (PAMELOR ) 10 MG capsule [id number] Yes Take 2 capsules (20 mg total) by mouth at bedtime. Onita Duos, MD  Active Self, Pharmacy Records  omeprazole  [facility]) 40 MG capsule [id number] Yes Take 1 capsule (40 mg total) by mouth daily. Federico Rosario BROCKS, MD  Active Self, Pharmacy Records  trimethoprim -polymyxin b (POLYTRIM) ophthalmic solution [id number] Yes Place 1 drop into both eyes See admin instructions. Administer 1 drop into each eye 4 times daily for 2 days after eye injections [provider]  Active Self, Pharmacy Records  valsartan  (DIOVAN ) 160 MG tablet [id number] Yes Take 1 tablet (160 mg total) by mouth daily. Georgina Speaks, FNP  Active Self, Pharmacy Records  Vitamin D , Ergocalciferol , (DRISDOL ) 1.25 MG (50000 UNIT) CAPS capsule [id number] Yes TAKE 1 CAPSULE (50,000 UNITS TOTAL) BY MOUTH EVERY 7 (SEVEN) DAYS.  Patient taking differently: Take 50,000 Units by mouth every Sunday.   Georgina Speaks, FNP  Active Self, Pharmacy Records            Recommendation:   Continue Current Plan of Care  Follow Up Plan:  Telephone follow-up in 1 week  Andrea Dimes RN, BSN [place]  [facility] RN Care Manager [phone]

## 2024-08-14 ENCOUNTER — Encounter (INDEPENDENT_AMBULATORY_CARE_PROVIDER_SITE_OTHER): Admitting: Ophthalmology

## 2024-08-14 DIAGNOSIS — E113512 Type 2 diabetes mellitus with proliferative diabetic retinopathy with macular edema, left eye: Secondary | ICD-10-CM | POA: Diagnosis not present

## 2024-08-14 DIAGNOSIS — E113311 Type 2 diabetes mellitus with moderate nonproliferative diabetic retinopathy with macular edema, right eye: Secondary | ICD-10-CM

## 2024-08-14 DIAGNOSIS — Z7984 Long term (current) use of oral hypoglycemic drugs: Secondary | ICD-10-CM | POA: Diagnosis not present

## 2024-08-14 DIAGNOSIS — H34812 Central retinal vein occlusion, left eye, with macular edema: Secondary | ICD-10-CM

## 2024-08-14 DIAGNOSIS — I10 Essential (primary) hypertension: Secondary | ICD-10-CM | POA: Diagnosis not present

## 2024-08-14 DIAGNOSIS — H43813 Vitreous degeneration, bilateral: Secondary | ICD-10-CM

## 2024-08-14 DIAGNOSIS — H35033 Hypertensive retinopathy, bilateral: Secondary | ICD-10-CM

## 2024-08-14 NOTE — Telephone Encounter (Signed)
 Returned call to patients. She states her sister was able to help her and she currently is wearing the monitor. She will call us  back if she needs additional help

## 2024-08-21 ENCOUNTER — Other Ambulatory Visit: Payer: Self-pay | Admitting: *Deleted

## 2024-08-21 NOTE — Transitions of Care (Post Inpatient/ED Visit) (Signed)
 Transition of Care week 3  Visit Note  08/21/2024  Name: [name] MRN: [id number]          DOB: February 28, 1956  Situation: Patient enrolled in [facility] 30-day program. Visit completed with Ms. Kamaka by telephone.   Background:   Initial Transition Care Management Follow-up Telephone Call Discharge Date and Diagnosis: 08/01/24, Stroke   Past Medical History:  Diagnosis Date   Allergy    Anemia 01/07/2020   REQUIRING TRANSFUSION   Back pain    Depression    Diabetes mellitus without complication (HCC)    GERD (gastroesophageal reflux disease)    Hypertension    Myocardial infarction (HCC)    Olecranon bursitis of left elbow    Sprain of left ankle 01/16/2023   Traumatic closed displaced fracture of shaft of right tibia and fibula 06/10/2016    Assessment: Patient Reported Symptoms: Cognitive Cognitive Status: Able to follow simple commands, Alert and oriented to person, place, and time, Normal speech and language skills      Neurological Neurological Review of Symptoms: Headaches Neurological Management Strategies: Coping strategies, Adequate rest, Routine screening, Medication therapy Neurological Self-Management Outcome: 3 (uncertain) Neurological Comment: Light headache, relieved with tylenol . Follow up with Neurology on 09/26/23.  HEENT   HEENT Comment: Retina specialist on 09/11/24    Cardiovascular Cardiovascular Symptoms Reported: No symptoms reported Cardiovascular Management Strategies: Medication therapy, Medical device, Adequate rest, Coping strategies, Routine screening Cardiovascular Self-Management Outcome: 3 (uncertain) Cardiovascular Comment: Patient is in the process of wearing 30 day heart monitor, she is currently waiting on more strips from the company. Patient aware of echocardiogram on 08/27/24 and has transportation. BP 141/74  Respiratory Respiratory Symptoms Reported: No symptoms reported    Endocrine Endocrine Symptoms Reported: No symptoms  reported Is patient diabetic?: Yes Is patient checking blood sugars at home?: Yes List most recent blood sugar readings, include date and time of day: Blood sugar was 120 on 08/20/24 after lunch Endocrine Self-Management Outcome: 4 (good)  Gastrointestinal Gastrointestinal Symptoms Reported: No symptoms reported Gastrointestinal Management Strategies: Diet modification Gastrointestinal Self-Management Outcome: 4 (good) Gastrointestinal Comment: LBM 08/20/24, eating activia which is helping with BMs    Genitourinary Genitourinary Symptoms Reported: No symptoms reported    Integumentary Integumentary Symptoms Reported: No symptoms reported    Musculoskeletal Musculoskelatal Symptoms Reviewed: Limited mobility Additional Musculoskeletal Details: ambulates with rollator Musculoskeletal Management Strategies: Medication therapy, Medical device, Routine screening, Exercise Musculoskeletal Comment: HH for PT/OT weekly and SLP twice weekly      Psychosocial Psychosocial Symptoms Reported: Not assessed         Today's Vitals   08/21/24 1102  BP: (!) 141/74   Pain Score: 5  Pain Type: Chronic pain Pain Location: Head Pain Intervention(s): Hot/Cold interventions  Medications Reviewed Today     Reviewed by Lucky Andrea LABOR, RN (Registered Nurse) on 08/21/24 at 1121  Med List Status: <None>   Medication Order Taking? Sig Documenting Provider Last Dose Status Informant  acetaminophen  (TYLENOL ) 325 MG tablet [id number] Yes Take 2 tablets (650 mg total) by mouth every 6 (six) hours as needed for up to 30 doses for moderate pain (pain score 4-6), mild pain (pain score 1-3) or headache. Cottie Donnice PARAS, MD  Active Self, Pharmacy Records  amLODipine  (NORVASC ) 5 MG tablet [id number] Yes TAKE 1 TABLET (5 MG TOTAL) BY MOUTH DAILY. Georgina Speaks, FNP  Active Self, Pharmacy Records  aspirin  EC 81 MG tablet [id number] Yes Take 1 tablet (81 mg total) by mouth daily. Swallow whole.  Toberman, Stevi W, NP   Active   atorvastatin  (LIPITOR) 10 MG tablet [id number] Yes TAKE 1 TABLET (10 MG TOTAL) BY MOUTH DAILY FOR CHOLESTEROL Georgina Speaks, FNP  Active Self, Pharmacy Records  Blood Glucose Monitoring Suppl DEVI [id number] Yes 1 each by Does not apply route in the morning, at noon, and at bedtime. May substitute to any manufacturer covered by patient's insurance. Georgina Speaks, FNP  Active   buPROPion  (WELLBUTRIN  XL) 150 MG 24 hr tablet [id number] Yes TAKE 1 TABLET (150 MG TOTAL) BY MOUTH EVERY MORNING. Georgina Speaks, FNP  Active   clopidogrel  (PLAVIX ) 75 MG tablet [id number] Yes Take 1 tablet (75 mg total) by mouth daily. Toberman, Stevi W, NP  Active   dorzolamide -timolol  (COSOPT ) 22.3-6.8 MG/ML ophthalmic solution [id number] Yes Place 1 drop into both eyes 2 (two) times daily. [provider]  Active Self, Pharmacy Records  Doxepin  HCl 3 MG TABS [id number]  Take 1 tablet (3 mg total) by mouth at bedtime as needed.  Patient not taking: Reported on 08/21/2024   Georgina Speaks, FNP  Active Self, Pharmacy Records  FEROSUL 325 [phone] Fe) MG tablet [id number] Yes TAKE 1 TABLET (325 MG TOTAL) BY MOUTH 2 (TWO) TIMES DAILY WITH A MEAL. Georgina Speaks, FNP  Active Self, Pharmacy Records  Glucose Blood (BLOOD GLUCOSE TEST STRIPS) STRP [id number] Yes 1 each by In Vitro route in the morning, at noon, and at bedtime. May substitute to any manufacturer covered by patient's insurance. Georgina Speaks, FNP  Active   Lancet Device MISC [id number] Yes 1 each by Does not apply route in the morning, at noon, and at bedtime. May substitute to any manufacturer covered by patient's insurance. Georgina Speaks, FNP  Active   Lancets [facility] CATHRYNE PLUS [place]) MISC [id number]  USE TO CHECK BLOOD SUGAR ONCE A DAY AS INSTRUCTED Georgina Speaks, FNP  Active Self, Pharmacy Records  Lancets MISC [id number] Yes 1 each by Does not apply route as directed. Dispense based on patient and insurance preference. Use up to four times daily as directed.  (FOR ICD-10 E10.9, E11.9). Georgina Speaks, FNP  Active   metFORMIN  (GLUCOPHAGE ) 500 MG tablet [id number] Yes Take 1 tablet (500 mg total) by mouth daily with breakfast. Georgina Speaks, FNP  Active Self, Pharmacy Records  nortriptyline  (PAMELOR ) 10 MG capsule [id number] Yes Take 2 capsules (20 mg total) by mouth at bedtime. Onita Duos, MD  Active Self, Pharmacy Records  omeprazole  [facility]) 40 MG capsule [id number] Yes Take 1 capsule (40 mg total) by mouth daily. Federico Rosario BROCKS, MD  Active Self, Pharmacy Records  trimethoprim -polymyxin b (POLYTRIM) ophthalmic solution [id number] Yes Place 1 drop into both eyes See admin instructions. Administer 1 drop into each eye 4 times daily for 2 days after eye injections [provider]  Active Self, Pharmacy Records  valsartan  (DIOVAN ) 160 MG tablet [id number] Yes Take 1 tablet (160 mg total) by mouth daily. Georgina Speaks, FNP  Active Self, Pharmacy Records  Vitamin D , Ergocalciferol , (DRISDOL ) 1.25 MG (50000 UNIT) CAPS capsule [id number] Yes TAKE 1 CAPSULE (50,000 UNITS TOTAL) BY MOUTH EVERY 7 (SEVEN) DAYS.  Patient taking differently: Take 50,000 Units by mouth every Sunday.   Georgina Speaks, FNP  Active Self, Pharmacy Records            Recommendation:   Continue Current Plan of Care  Follow Up Plan:   Telephone follow-up in 1 week  Andrea Dimes RN, BSN [place]  [facility] Health RN Care Manager [phone]

## 2024-08-21 NOTE — Patient Instructions (Signed)
 Visit Information  Thank you for taking time to visit with me today. Please don't hesitate to contact me if I can be of assistance to you before our next scheduled telephone appointment.   Following is a copy of your care plan:   Goals Addressed             This Visit's Progress    VBCI Transitions of Care (TOC) Care Plan       Problems:  Recent Hospitalization for treatment of Stroke Knowledge Deficit Related to Stroke and No Hospital Follow Up Provider appointment available until Jan. Patient will be placed on list for earlier call  Goal:  Over the next 30 days, the patient will not experience hospital readmission  Interventions:  Stroke: Reviewed Importance of taking all medications as prescribed Reviewed Importance of attending all scheduled provider appointments Screening for signs and symptoms of depression related to chronic disease state Assessed for signs and symptoms of stroke Assessed for fall status and safety in the home Discussed upcoming appointments including: Cardiac Imaging on 08/27/24, Retina Specialist on 08/14/24, Cardiology on 09/24/24 and Neurology on 09/25/24 Patient has paused heart monitor while she waits on more strips from the company Ensured [facility] PT/OT/SLP 1-2 times weekly for each support Patient will have HH Aide twice weekly for 4 hours/day [employer] SW has arranged meal delivery through [employer])-verified patient is receiving meals Reviewed BP Reviewed BS Medication review-discussed patient is to stop Aspirin  on 08/23/24 and continue taking Plavix   Patient Self Care Activities:  Attend all scheduled provider appointments Call pharmacy for medication refills 3-7 days in advance of running out of medications Call provider office for new concerns or questions  Notify RN Care Manager of [facility] call rescheduling needs Participate in Transition of Care Program/Attend TOC scheduled calls Take medications as prescribed    Plan:   Telephone follow up appointment with care management team member scheduled for:   08/28/2024 at 1:15pm         Patient verbalizes understanding of instructions and care plan provided today and agrees to view in MyChart. Active MyChart status and patient understanding of how to access instructions and care plan via MyChart confirmed with patient.     Telephone follow up appointment with care management team member scheduled for:08/28/24 at 1:15pm  Please call the care guide team at [phone] if you need to cancel or reschedule your appointment.   Please call [phone]-TALK (toll free, 24 hour hotline) go to [facility] [address], [place] [phone]) call 911 if you are experiencing a Mental Health or Behavioral Health Crisis or need someone to talk to.  Andrea Dimes RN, BSN [place]  Value[facility]Based [facility] Institute [facility] Health RN Care Manager [phone]

## 2024-08-23 NOTE — Assessment & Plan Note (Signed)
 Refill for glucometer sent

## 2024-08-23 NOTE — Assessment & Plan Note (Signed)
 She is to continue plavix  and aspirin  for 3 weeks then plavix  alone. She is to follow up with Neurology. Continue with OT/PT/speech.

## 2024-08-27 ENCOUNTER — Encounter (HOSPITAL_BASED_OUTPATIENT_CLINIC_OR_DEPARTMENT_OTHER)

## 2024-08-27 ENCOUNTER — Other Ambulatory Visit (HOSPITAL_BASED_OUTPATIENT_CLINIC_OR_DEPARTMENT_OTHER)

## 2024-08-27 DIAGNOSIS — I679 Cerebrovascular disease, unspecified: Secondary | ICD-10-CM | POA: Diagnosis not present

## 2024-08-27 DIAGNOSIS — I639 Cerebral infarction, unspecified: Secondary | ICD-10-CM

## 2024-08-27 DIAGNOSIS — R519 Headache, unspecified: Secondary | ICD-10-CM

## 2024-08-27 LAB — ECHOCARDIOGRAM COMPLETE
Area-P 1/2: 2.76 cm2
S' Lateral: 2.44 cm

## 2024-08-28 ENCOUNTER — Other Ambulatory Visit: Payer: Self-pay | Admitting: *Deleted

## 2024-08-28 ENCOUNTER — Other Ambulatory Visit: Payer: Self-pay | Admitting: Nurse Practitioner

## 2024-08-28 DIAGNOSIS — E559 Vitamin D deficiency, unspecified: Secondary | ICD-10-CM

## 2024-08-28 NOTE — Transitions of Care (Post Inpatient/ED Visit) (Signed)
 " Transition of Care week 4  Visit Note  08/28/2024  Name: [name] MRN: [id number]          DOB: 17-Jul-1956  Situation: Patient enrolled in [facility] 30-day program. Visit completed with [name] by telephone.   Background:   Initial Transition Care Management Follow-up Telephone Call Discharge Date and Diagnosis: 08/01/24, Stroke   Past Medical History:  Diagnosis Date   Allergy    Anemia 01/07/2020   REQUIRING TRANSFUSION   Back pain    Depression    Diabetes mellitus without complication (HCC)    GERD (gastroesophageal reflux disease)    Hypertension    Myocardial infarction (HCC)    Olecranon bursitis of left elbow    Sprain of left ankle 01/16/2023   Traumatic closed displaced fracture of shaft of right tibia and fibula 06/10/2016    Assessment: Patient Reported Symptoms: Cognitive Cognitive Status: Able to follow simple commands, Alert and oriented to person, place, and time, Normal speech and language skills      Neurological Neurological Review of Symptoms: Headaches Neurological Management Strategies: Medication therapy, Adequate rest, Coping strategies, Routine screening Neurological Self-Management Outcome: 3 (uncertain) Neurological Comment: Patient continues to work with speech therapy at home twice weekly. Speech is improving. Scheduled with Neurology on 09/26/23. Patient's sister will take her and provide transportation  HEENT HEENT Symptoms Reported: No symptoms reported HEENT Comment: Retina specialist on 09/11/24, scheduled for cataract surgery for right eye on 10/08/24    Cardiovascular Cardiovascular Symptoms Reported: No symptoms reported Does patient have uncontrolled Hypertension?: No Cardiovascular Management Strategies: Adequate rest, Coping strategies, Routine screening, Medical device, Medication therapy, Exercise Cardiovascular Self-Management Outcome: 4 (good) Cardiovascular Comment: BP 136/81, patient continues to wear 30 day heart  monitor. Scheduled with Cardiology on 09/24/24  Respiratory Respiratory Symptoms Reported: No symptoms reported    Endocrine Endocrine Symptoms Reported: No symptoms reported Is patient diabetic?: Yes Is patient checking blood sugars at home?: Yes List most recent blood sugar readings, include date and time of day: Fasting today 91 Endocrine Self-Management Outcome: 4 (good)  Gastrointestinal Gastrointestinal Symptoms Reported: No symptoms reported      Genitourinary Genitourinary Symptoms Reported: No symptoms reported    Integumentary Integumentary Symptoms Reported: No symptoms reported    Musculoskeletal Musculoskelatal Symptoms Reviewed: Limited mobility Additional Musculoskeletal Details: ambulates with rollator Musculoskeletal Management Strategies: Activity, Exercise, Coping strategies, Adequate rest, Routine screening, Medical device Musculoskeletal Self-Management Outcome: 4 (good) Musculoskeletal Comment: HH for PT weekly and exercising on off days. Falls in the past year?: No    Psychosocial Psychosocial Symptoms Reported: No symptoms reported         Today's Vitals   08/28/24 1355  BP: 136/81   Pain Score: 8  Pain Type: Chronic pain Pain Location: Head Pain Intervention(s): Hot/Cold interventions (Patient will take tylenol  and use a cold compress)  Medications Reviewed Today     Reviewed by Lucky Andrea LABOR, RN (Registered Nurse) on 08/28/24 at 1408  Med List Status: <None>   Medication Order Taking? Sig Documenting Provider Last Dose Status Informant  acetaminophen  (TYLENOL ) 325 MG tablet [id number] Yes Take 2 tablets (650 mg total) by mouth every 6 (six) hours as needed for up to 30 doses for moderate pain (pain score 4-6), mild pain (pain score 1-3) or headache. Cottie Donnice PARAS, MD  Active Self, Pharmacy Records  amLODipine  (NORVASC ) 5 MG tablet [id number] Yes TAKE 1 TABLET (5 MG TOTAL) BY MOUTH DAILY. Georgina Speaks, FNP  Active Self, Pharmacy Records  aspirin   EC 81 MG tablet [id number]  Take 1 tablet (81 mg total) by mouth daily. Swallow whole.  Patient not taking: Reported on 08/28/2024   Toberman, Stevi W, NP  Active   atorvastatin  (LIPITOR) 10 MG tablet [id number] Yes TAKE 1 TABLET (10 MG TOTAL) BY MOUTH DAILY FOR CHOLESTEROL Georgina Speaks, FNP  Active Self, Pharmacy Records  Blood Glucose Monitoring Suppl DEVI [id number] Yes 1 each by Does not apply route in the morning, at noon, and at bedtime. May substitute to any manufacturer covered by patient's insurance. Georgina Speaks, FNP  Active   buPROPion  (WELLBUTRIN  XL) 150 MG 24 hr tablet [id number] Yes TAKE 1 TABLET (150 MG TOTAL) BY MOUTH EVERY MORNING. Georgina Speaks, FNP  Active   clopidogrel  (PLAVIX ) 75 MG tablet [id number] Yes Take 1 tablet (75 mg total) by mouth daily. Toberman, Stevi W, NP  Active   dorzolamide -timolol  (COSOPT ) 22.3-6.8 MG/ML ophthalmic solution [id number] Yes Place 1 drop into both eyes 2 (two) times daily. [provider]  Active Self, Pharmacy Records  Doxepin  HCl 3 MG TABS [id number]  Take 1 tablet (3 mg total) by mouth at bedtime as needed.  Patient not taking: Reported on 08/28/2024   Georgina Speaks, FNP  Active Self, Pharmacy Records  FEROSUL 325 (65 Fe) MG tablet [id number] Yes TAKE 1 TABLET (325 MG TOTAL) BY MOUTH 2 (TWO) TIMES DAILY WITH A MEAL. Georgina Speaks, FNP  Active Self, Pharmacy Records  Glucose Blood (BLOOD GLUCOSE TEST STRIPS) STRP [id number] Yes 1 each by In Vitro route in the morning, at noon, and at bedtime. May substitute to any manufacturer covered by patient's insurance. Georgina Speaks, FNP  Active   Lancet Device MISC [id number] Yes 1 each by Does not apply route in the morning, at noon, and at bedtime. May substitute to any manufacturer covered by patient's insurance. Georgina Speaks, FNP  Active   Lancets [facility] CATHRYNE PLUS [place]) MISC [id number]  USE TO CHECK BLOOD SUGAR ONCE A DAY AS INSTRUCTED Georgina Speaks, FNP  Active Self, Pharmacy Records   Lancets MISC [id number] Yes 1 each by Does not apply route as directed. Dispense based on patient and insurance preference. Use up to four times daily as directed. (FOR ICD-10 E10.9, E11.9). Georgina Speaks, FNP  Active   metFORMIN  (GLUCOPHAGE ) 500 MG tablet [id number] Yes Take 1 tablet (500 mg total) by mouth daily with breakfast. Georgina Speaks, FNP  Active Self, Pharmacy Records  nortriptyline  (PAMELOR ) 10 MG capsule [id number] Yes Take 2 capsules (20 mg total) by mouth at bedtime. Onita Duos, MD  Active Self, Pharmacy Records  omeprazole  [facility]) 40 MG capsule [id number] Yes Take 1 capsule (40 mg total) by mouth daily. Federico Rosario BROCKS, MD  Active Self, Pharmacy Records  trimethoprim -polymyxin b [facility]) ophthalmic solution [id number] Yes Place 1 drop into both eyes See admin instructions. Administer 1 drop into each eye 4 times daily for 2 days after eye injections [provider]  Active Self, Pharmacy Records  valsartan  (DIOVAN ) 160 MG tablet [id number] Yes Take 1 tablet (160 mg total) by mouth daily. Georgina Speaks, FNP  Active Self, Pharmacy Records  Vitamin D , Ergocalciferol , (DRISDOL ) 1.25 MG (50000 UNIT) CAPS capsule [id number] Yes TAKE 1 CAPSULE (50,000 UNITS TOTAL) BY MOUTH EVERY 7 (SEVEN) DAYS.  Patient taking differently: Take 50,000 Units by mouth every Sunday.   Georgina Speaks, FNP  Active Self, Pharmacy Records            Recommendation:   Continue Current Plan of Care  Follow  Up Plan:   Telephone follow-up in 1 week  Andrea Dimes RN, BSN [place]  [facility] Health RN Care Manager [phone]     "

## 2024-08-28 NOTE — Patient Instructions (Signed)
 Visit Information  Thank you for taking time to visit with me today. Please don't hesitate to contact me if I can be of assistance to you before our next scheduled telephone appointment.   Following is a copy of your care plan:   Goals Addressed             This Visit's Progress    VBCI Transitions of Care (TOC) Care Plan       Problems:  Recent Hospitalization for treatment of Stroke Knowledge Deficit Related to Stroke and No Hospital Follow Up Provider appointment available until Jan. Patient will be placed on list for earlier call  Goal:  Over the next 30 days, the patient will not experience hospital readmission  Interventions:  Stroke: Reviewed Importance of taking all medications as prescribed Reviewed Importance of attending all scheduled provider appointments Assessed for signs and symptoms of stroke Assessed for fall status and safety in the home Discussed upcoming appointments including:  Sleep consult on 09/08/24, Retina Specialist on 09/11/24, Cardiology on 09/24/24 and Neurology on 09/25/24 Ensured [facility] for [facility] PT/OT/SLP 1-2 times weekly for each support Patient will have [facility] Aide twice weekly for 4 hours/day-reports has not received-Patient will follow up with [facility] for an update Reviewed BP Reviewed BS Medication review-discussed patient stopped taking Aspirin  on 08/23/24 and continue taking Plavix   Patient Self Care Activities:  Attend all scheduled provider appointments Call pharmacy for medication refills 3-7 days in advance of running out of medications Call provider office for new concerns or questions  Notify RN Care Manager of [facility] call rescheduling needs Participate in Transition of Care Program/Attend TOC scheduled calls Take medications as prescribed    Plan:  Telephone follow up appointment with care management team member scheduled for:   09/04/2024 at 11am         Patient verbalizes understanding of instructions and care plan provided today  and agrees to view in MyChart. Active MyChart status and patient understanding of how to access instructions and care plan via MyChart confirmed with patient.     Telephone follow up appointment with care management team member scheduled for:09/04/24 at 11am  Please call the care guide team at [phone] if you need to cancel or reschedule your appointment.   Please call 1-800-273-TALK (toll free, 24 hour hotline) go to [facility] Urgent [facility] [address] [place] [phone]) call 911 if you are experiencing a Mental Health or Behavioral Health Crisis or need someone to talk to.  Andrea Dimes RN, BSN [place]  [facility] Health RN Care Manager [phone]

## 2024-08-31 ENCOUNTER — Other Ambulatory Visit: Payer: Self-pay | Admitting: Licensed Clinical Social Worker

## 2024-09-01 ENCOUNTER — Encounter: Payer: Self-pay | Admitting: Nurse Practitioner

## 2024-09-01 NOTE — Patient Instructions (Signed)
 Visit Information  Thank you for taking time to visit with me today. Please don't hesitate to contact me if I can be of assistance to you before our next scheduled appointment.  Your next care management appointment is by telephone on 1/26 at 1 PM  Please call the care guide team at [phone] if you need to cancel, schedule, or reschedule an appointment.   Please call the Suicide and Crisis Lifeline: 988 go to [facility] Urgent [facility] [address] [place] [phone]) call 911 if you are experiencing a Mental Health or Behavioral Health Crisis or need someone to talk to.  Rolin Kerns, LCSW [place]  [facility], [facility] Clinical Social Worker Direct Dial: [phone]  Fax: [phone] Website: [email or web address] 2:09 PM

## 2024-09-01 NOTE — Patient Outreach (Signed)
 Complex Care Management   Visit Note  09/01/2024  Name:  [name] MRN: [id number] DOB: Jun 15, 1956  Situation: Referral received for Complex Care Management related to Mental/Behavioral Health diagnosis Anxiety I obtained verbal consent from Patient.  Visit completed with Patient  on the phone  Background:   Past Medical History:  Diagnosis Date   Allergy    Anemia 01/07/2020   REQUIRING TRANSFUSION   Back pain    Depression    Diabetes mellitus without complication (HCC)    GERD (gastroesophageal reflux disease)    Hypertension    Myocardial infarction (HCC)    Olecranon bursitis of left elbow    Sprain of left ankle 01/16/2023   Traumatic closed displaced fracture of shaft of right tibia and fibula 06/10/2016    Assessment: Patient Reported Symptoms:  Cognitive Cognitive Status: No symptoms reported, Alert and oriented to person, place, and time Cognitive/Intellectual Conditions Management [RPT]: None reported or documented in medical history or problem list   Health Maintenance Behaviors: Annual physical exam  Neurological Neurological Review of Symptoms: Other: [place] Neurological Symptoms/Conditions [RPT]: Stroke Neurological Management Strategies: Coping strategies, Medication therapy, Routine screening, Medical device Neurological Comment: Pt had recent hospitalization in Nov 2025 due to stroke. Participating in [facility] RN case management  HEENT HEENT Symptoms Reported: No symptoms reported      Cardiovascular Cardiovascular Symptoms Reported: Not assessed    Respiratory Respiratory Symptoms Reported: No symptoms reported    Endocrine Endocrine Symptoms Reported: Not assessed    Gastrointestinal Gastrointestinal Symptoms Reported: No symptoms reported Gastrointestinal Comment: Pt obtained [employer] Meals after d/c from hospital, which has been very helpful    Genitourinary Genitourinary Symptoms Reported: No symptoms reported    Integumentary Integumentary  Symptoms Reported: Not assessed    Musculoskeletal Musculoskelatal Symptoms Reviewed: Limited mobility Musculoskeletal Management Strategies: Coping strategies, Exercise, Diet modification, Medical device Musculoskeletal Comment: Patient is participating in [facility] PT, ST, and OT. Utilizes walker in the home and denies any falls since d/c from hospital   Patient at Risk for Falls Due to: Impaired mobility Fall risk Follow up: Falls prevention discussed  Psychosocial Psychosocial Symptoms Reported: Anxiety - if selected complete GAD Behavioral Management Strategies: Adequate rest, Support system, Coping strategies, Exercise, Medication therapy Behavioral Health Comment: Patient endorsed increase in anxiety after stroke. Utilizing self-care and mindfulness strategies to assist with symptom managment. Has strong support from family and friends Major Change/Loss/Stressor/Fears (CP): Medical condition, self Techniques to Cope with Loss/Stress/Change: Diversional activities, Spiritual practice(s), Medication, Exercise Quality of Family Relationships: involved, helpful, supportive    09/01/2024    PHQ2-9 Depression Screening   Little interest or pleasure in doing things    Feeling down, depressed, or hopeless    PHQ-2 - Total Score    Trouble falling or staying asleep, or sleeping too much    Feeling tired or having little energy    Poor appetite or overeating     Feeling bad about yourself - or that you are a failure or have let yourself or your family down    Trouble concentrating on things, such as reading the newspaper or watching television    Moving or speaking so slowly that other people could have noticed.  Or the opposite - being so fidgety or restless that you have been moving around a lot more than usual    Thoughts that you would be better off dead, or hurting yourself in some way    PHQ2-9 Total Score    If you checked  off any problems, how difficult have these problems made it for you  to do your work, take care of things at home, or get along with other people    Depression Interventions/Treatment      There were no vitals filed for this visit.    Medications Reviewed Today     Reviewed by Aishia Barkey D, LCSW (Social Worker) on 09/01/24 at 1400  Med List Status: <None>   Medication Order Taking? Sig Documenting Provider Last Dose Status Informant  acetaminophen  (TYLENOL ) 325 MG tablet [id number]  Take 2 tablets (650 mg total) by mouth every 6 (six) hours as needed for up to 30 doses for moderate pain (pain score 4-6), mild pain (pain score 1-3) or headache. Cottie Donnice PARAS, MD  Active Self, Pharmacy Records  amLODipine  (NORVASC ) 5 MG tablet [id number]  TAKE 1 TABLET (5 MG TOTAL) BY MOUTH DAILY. Georgina Speaks, FNP  Active Self, Pharmacy Records  aspirin  EC 81 MG tablet [id number]  Take 1 tablet (81 mg total) by mouth daily. Swallow whole.  Patient not taking: Reported on 08/28/2024   Toberman, Stevi W, NP  Active   atorvastatin  (LIPITOR) 10 MG tablet [id number]  TAKE 1 TABLET (10 MG TOTAL) BY MOUTH DAILY FOR CHOLESTEROL Georgina Speaks, FNP  Active Self, Pharmacy Records  Blood Glucose Monitoring Suppl DEVI [id number]  1 each by Does not apply route in the morning, at noon, and at bedtime. May substitute to any manufacturer covered by patient's insurance. Georgina Speaks, FNP  Active   buPROPion  (WELLBUTRIN  XL) 150 MG 24 hr tablet [id number]  TAKE 1 TABLET (150 MG TOTAL) BY MOUTH EVERY MORNING. Georgina Speaks, FNP  Active   clopidogrel  (PLAVIX ) 75 MG tablet [id number]  Take 1 tablet (75 mg total) by mouth daily. Toberman, Stevi W, NP  Active   dorzolamide -timolol  (COSOPT ) 22.3-6.8 MG/ML ophthalmic solution [id number]  Place 1 drop into both eyes 2 (two) times daily. [provider]  Active Self, Pharmacy Records  Doxepin  HCl 3 MG TABS [id number]  Take 1 tablet (3 mg total) by mouth at bedtime as needed.  Patient not taking: Reported on 08/28/2024   Georgina Speaks, FNP   Active Self, Pharmacy Records  FEROSUL 325 (65 Fe) MG tablet [id number]  TAKE 1 TABLET (325 MG TOTAL) BY MOUTH 2 (TWO) TIMES DAILY WITH A MEAL. Georgina Speaks, FNP  Active Self, Pharmacy Records  Glucose Blood (BLOOD GLUCOSE TEST STRIPS) STRP [id number]  1 each by In Vitro route in the morning, at noon, and at bedtime. May substitute to any manufacturer covered by patient's insurance. Georgina Speaks, FNP  Active   Lancet Device MISC [id number]  1 each by Does not apply route in the morning, at noon, and at bedtime. May substitute to any manufacturer covered by patient's insurance. Georgina Speaks, FNP  Active   Lancets [facility] CATHRYNE PLUS [place]) MISC [id number]  USE TO CHECK BLOOD SUGAR ONCE A DAY AS INSTRUCTED Georgina Speaks, FNP  Active Self, Pharmacy Records  Lancets MISC [id number]  1 each by Does not apply route as directed. Dispense based on patient and insurance preference. Use up to four times daily as directed. (FOR ICD-10 E10.9, E11.9). Georgina Speaks, FNP  Active   metFORMIN  (GLUCOPHAGE ) 500 MG tablet [id number]  Take 1 tablet (500 mg total) by mouth daily with breakfast. Georgina Speaks, FNP  Active Self, Pharmacy Records  nortriptyline  (PAMELOR ) 10 MG capsule [id number]  Take 2 capsules (20 mg total) by mouth at bedtime. Onita Duos, MD  Active Self,  Pharmacy Records  omeprazole  (PRILOSEC) 40 MG capsule [id number]  Take 1 capsule (40 mg total) by mouth daily. Federico Rosario BROCKS, MD  Active Self, Pharmacy Records  trimethoprim -polymyxin b [facility]) ophthalmic solution [id number]  Place 1 drop into both eyes See admin instructions. Administer 1 drop into each eye 4 times daily for 2 days after eye injections [provider]  Active Self, Pharmacy Records  valsartan  (DIOVAN ) 160 MG tablet [id number]  Take 1 tablet (160 mg total) by mouth daily. Georgina Speaks, FNP  Active Self, Pharmacy Records  Vitamin D , Ergocalciferol , (DRISDOL ) 1.25 MG (50000 UNIT) CAPS capsule [id number]  TAKE 1 CAPSULE (50,000  UNITS TOTAL) BY MOUTH EVERY 7 (SEVEN) DAYS. Georgina Speaks, FNP  Active             Recommendation:   Continue Current Plan of Care  Follow Up Plan:   Telephone follow-up in 1 month  Rolin Kerns, LCSW [facility] Health  [facility] Clinical Social Worker Direct Dial: [phone]  Fax: [phone] Website: [email or web address] 2:09 PM

## 2024-09-04 ENCOUNTER — Other Ambulatory Visit: Payer: Self-pay | Admitting: *Deleted

## 2024-09-04 DIAGNOSIS — I639 Cerebral infarction, unspecified: Secondary | ICD-10-CM

## 2024-09-04 NOTE — Patient Instructions (Signed)
 Visit Information  Thank you for taking time to visit with me today. Please don't hesitate to contact me if I can be of assistance to you before our next scheduled telephone appointment.   Following is a copy of your care plan:   Goals Addressed             This Visit's Progress    COMPLETED: VBCI Transitions of Care (TOC) Care Plan       Problems:  Recent Hospitalization for treatment of Stroke Knowledge Deficit Related to Stroke and No Hospital Follow Up Provider appointment available until Jan. Patient will be placed on list for earlier call  Goal:  Over the next 30 days, the patient will not experience hospital readmission  Interventions:  Stroke: Reviewed Importance of taking all medications as prescribed Reviewed Importance of attending all scheduled provider appointments Advised to report any changes in symptoms or exercise tolerance Assessed for signs and symptoms of stroke Assessed for fall status and safety in the home Discussed upcoming appointments including:  Sleep consult on 09/08/24, Retina Specialist on 09/11/24, Cardiology on 09/24/24 and Neurology on 09/25/24 Discussed patient continues with [facility] for [facility] PT/SLP  Medication review Discussed the importance of wearing heart monitor for 30 days and contacting the company for instructions to complete the process Referral to Longitudinal Case Management-patient agrees   Patient Self Care Activities:  Attend all scheduled provider appointments Call pharmacy for medication refills 3-7 days in advance of running out of medications Call provider office for new concerns or questions  Notify RN Care Manager of TOC call rescheduling needs Participate in Transition of Care Program/Attend TOC scheduled calls Take medications as prescribed    Plan:  Patient will be contacted within 30 days by Care Guide          Patient verbalizes understanding of instructions and care plan provided today and agrees to view in  MyChart. Active MyChart status and patient understanding of how to access instructions and care plan via MyChart confirmed with patient.     The care management team will reach out to the patient again over the next 30 days.   Please call the care guide team at [phone] if you need to cancel or reschedule your appointment.   Please call 1-800-273-TALK (toll free, 24 hour hotline) go to [facility] Urgent [facility] [address], [place] [phone]) call 911 if you are experiencing a Mental Health or Behavioral Health Crisis or need someone to talk to.  Andrea Dimes RN, BSN [place]  [facility] Health RN Care Manager [phone]

## 2024-09-04 NOTE — Transitions of Care (Post Inpatient/ED Visit) (Signed)
 " Transition of Care week 5  Visit Note  09/04/2024  Name: [name] MRN: [id number]          DOB: [birth year]  Situation: Patient enrolled in [facility] 30-day program. Visit completed with Ms. Heckert by telephone.   Background:   Initial Transition Care Management Follow-up Telephone Call Discharge Date and Diagnosis: 08/01/24, Stroke   Past Medical History:  Diagnosis Date   Allergy    Anemia 01/07/2020   REQUIRING TRANSFUSION   Back pain    Depression    Diabetes mellitus without complication (HCC)    GERD (gastroesophageal reflux disease)    Hypertension    Myocardial infarction (HCC)    Olecranon bursitis of left elbow    Sprain of left ankle 01/16/2023   Traumatic closed displaced fracture of shaft of right tibia and fibula 06/10/2016    Assessment: Patient Reported Symptoms: Cognitive Cognitive Status: Able to follow simple commands, Alert and oriented to person, place, and time, Normal speech and language skills      Neurological Neurological Review of Symptoms: Weakness, Headaches Oher Neurological Symptoms/Conditions [RPT]: Occasional headaches, weakness due to recent stroke Neurological Management Strategies: Coping strategies, Adequate rest, Routine screening, Medical device Neurological Self-Management Outcome: 3 (uncertain) Neurological Comment: Ambulating with Rollator, Continues with HH PT and SLP. Patient reports speech is clearing and her mouth is stronger.  HEENT HEENT Symptoms Reported: Not assessed      Cardiovascular Cardiovascular Symptoms Reported: No symptoms reported Cardiovascular Self-Management Outcome: 4 (good) Cardiovascular Comment: Continues to wear heart monitor.  Respiratory Respiratory Symptoms Reported: No symptoms reported    Endocrine Endocrine Symptoms Reported: No symptoms reported Is patient diabetic?: Yes Is patient checking blood sugars at home?: Yes List most recent blood sugar readings, include date and time of day:  Fasting today 106 Endocrine Self-Management Outcome: 4 (good)  Gastrointestinal Gastrointestinal Symptoms Reported: No symptoms reported      Genitourinary Genitourinary Symptoms Reported: No symptoms reported    Integumentary Integumentary Symptoms Reported: No symptoms reported    Musculoskeletal Musculoskelatal Symptoms Reviewed: Weakness Additional Musculoskeletal Details: ambulates with rollator Musculoskeletal Management Strategies: Medication therapy, Routine screening, Medical device, Exercise, Coping strategies, Adequate rest Musculoskeletal Self-Management Outcome: 4 (good)      Psychosocial Psychosocial Symptoms Reported: Not assessed         There were no vitals filed for this visit. Pain Score: 7  Pain Type: Chronic pain Pain Location: Head Pain Onset: On-going Pain Intervention(s): Hot/Cold interventions (Patient will take tylenol  which usually relieves her headache.)  Medications Reviewed Today     Reviewed by Lucky Andrea LABOR, RN (Registered Nurse) on 09/04/24 at 1134  Med List Status: <None>   Medication Order Taking? Sig Documenting Provider Last Dose Status Informant  acetaminophen  (TYLENOL ) 325 MG tablet [id number] Yes Take 2 tablets (650 mg total) by mouth every 6 (six) hours as needed for up to 30 doses for moderate pain (pain score 4-6), mild pain (pain score 1-3) or headache. Cottie Donnice PARAS, MD  Active Self, Pharmacy Records  amLODipine  (NORVASC ) 5 MG tablet [id number] Yes TAKE 1 TABLET (5 MG TOTAL) BY MOUTH DAILY. Georgina Speaks, FNP  Active Self, Pharmacy Records  aspirin  EC 81 MG tablet [id number]  Take 1 tablet (81 mg total) by mouth daily. Swallow whole.  Patient not taking: Reported on 09/04/2024   Toberman, Stevi W, NP  Active   atorvastatin  (LIPITOR) 10 MG tablet [id number] Yes TAKE 1 TABLET (10 MG TOTAL) BY MOUTH DAILY FOR CHOLESTEROL Georgina Speaks, FNP  Active Self, Pharmacy Records  Blood Glucose Monitoring Suppl DEVI [id number] Yes 1 each by Does  not apply route in the morning, at noon, and at bedtime. May substitute to any manufacturer covered by patient's insurance. Georgina Speaks, FNP  Active   buPROPion  (WELLBUTRIN  XL) 150 MG 24 hr tablet [id number] Yes TAKE 1 TABLET (150 MG TOTAL) BY MOUTH EVERY MORNING. Georgina Speaks, FNP  Active   clopidogrel  (PLAVIX ) 75 MG tablet [id number] Yes Take 1 tablet (75 mg total) by mouth daily. Toberman, Stevi W, NP  Active   dorzolamide -timolol  (COSOPT ) 22.3-6.8 MG/ML ophthalmic solution [id number] Yes Place 1 drop into both eyes 2 (two) times daily. [provider]  Active Self, Pharmacy Records  Doxepin  HCl 3 MG TABS [id number]  Take 1 tablet (3 mg total) by mouth at bedtime as needed.  Patient not taking: Reported on 09/04/2024   Georgina Speaks, FNP  Active Self, Pharmacy Records  FEROSUL 325 [phone]) MG tablet [id number] Yes TAKE 1 TABLET (325 MG TOTAL) BY MOUTH 2 (TWO) TIMES DAILY WITH A MEAL. Georgina Speaks, FNP  Active Self, Pharmacy Records  Glucose Blood (BLOOD GLUCOSE TEST STRIPS) STRP [id number] Yes 1 each by In Vitro route in the morning, at noon, and at bedtime. May substitute to any manufacturer covered by patient's insurance. Georgina Speaks, FNP  Active   Lancet Device MISC [id number] Yes 1 each by Does not apply route in the morning, at noon, and at bedtime. May substitute to any manufacturer covered by patient's insurance. Georgina Speaks, FNP  Active   Lancets [facility] CATHRYNE PLUS [place]) MISC [id number] Yes USE TO CHECK BLOOD SUGAR ONCE A DAY AS INSTRUCTED Georgina Speaks, FNP  Active Self, Pharmacy Records  Lancets MISC [id number] Yes 1 each by Does not apply route as directed. Dispense based on patient and insurance preference. Use up to four times daily as directed. (FOR ICD-10 E10.9, E11.9). Georgina Speaks, FNP  Active   metFORMIN  (GLUCOPHAGE ) 500 MG tablet [id number] Yes Take 1 tablet (500 mg total) by mouth daily with breakfast. Georgina Speaks, FNP  Active Self, Pharmacy Records  nortriptyline   (PAMELOR ) 10 MG capsule [id number] Yes Take 2 capsules (20 mg total) by mouth at bedtime. Onita Duos, MD  Active Self, Pharmacy Records  omeprazole  [facility]) 40 MG capsule [id number] Yes Take 1 capsule (40 mg total) by mouth daily. Federico Rosario BROCKS, MD  Active Self, Pharmacy Records  trimethoprim -polymyxin b [facility]) ophthalmic solution [id number] Yes Place 1 drop into both eyes See admin instructions. Administer 1 drop into each eye 4 times daily for 2 days after eye injections [provider]  Active Self, Pharmacy Records  valsartan  (DIOVAN ) 160 MG tablet [id number] Yes Take 1 tablet (160 mg total) by mouth daily. Georgina Speaks, FNP  Active Self, Pharmacy Records  Vitamin D , Ergocalciferol , (DRISDOL ) 1.25 MG (50000 UNIT) CAPS capsule [id number] Yes TAKE 1 CAPSULE (50,000 UNITS TOTAL) BY MOUTH EVERY 7 (SEVEN) DAYS. Georgina Speaks, FNP  Active             Recommendation:   Continue Current Plan of Care  Follow Up Plan:   Referral to RN Case Manager Closing From:  Transitions of Care Program  Andrea Dimes RN, BSN [place]  [facility] RN Care Manager [phone]     "

## 2024-09-08 ENCOUNTER — Telehealth: Payer: Self-pay | Admitting: Neurology

## 2024-09-08 ENCOUNTER — Institutional Professional Consult (permissible substitution): Admitting: Neurology

## 2024-09-08 NOTE — Telephone Encounter (Signed)
 Pt sister called to Cancel appt due to Pt being sick appt Canceled

## 2024-09-11 ENCOUNTER — Encounter (INDEPENDENT_AMBULATORY_CARE_PROVIDER_SITE_OTHER): Admitting: Ophthalmology

## 2024-09-11 DIAGNOSIS — I10 Essential (primary) hypertension: Secondary | ICD-10-CM

## 2024-09-11 DIAGNOSIS — H35033 Hypertensive retinopathy, bilateral: Secondary | ICD-10-CM | POA: Diagnosis not present

## 2024-09-11 DIAGNOSIS — H43813 Vitreous degeneration, bilateral: Secondary | ICD-10-CM | POA: Diagnosis not present

## 2024-09-11 DIAGNOSIS — Z7984 Long term (current) use of oral hypoglycemic drugs: Secondary | ICD-10-CM

## 2024-09-11 DIAGNOSIS — H2511 Age-related nuclear cataract, right eye: Secondary | ICD-10-CM

## 2024-09-11 DIAGNOSIS — E113512 Type 2 diabetes mellitus with proliferative diabetic retinopathy with macular edema, left eye: Secondary | ICD-10-CM

## 2024-09-11 DIAGNOSIS — H34812 Central retinal vein occlusion, left eye, with macular edema: Secondary | ICD-10-CM

## 2024-09-11 DIAGNOSIS — E113311 Type 2 diabetes mellitus with moderate nonproliferative diabetic retinopathy with macular edema, right eye: Secondary | ICD-10-CM

## 2024-09-14 ENCOUNTER — Institutional Professional Consult (permissible substitution): Admitting: Neurology

## 2024-09-14 ENCOUNTER — Encounter: Payer: Self-pay | Admitting: Neurology

## 2024-09-14 VITALS — BP 160/90 | HR 64 | Ht 65.0 in | Wt 184.0 lb

## 2024-09-14 DIAGNOSIS — Z9189 Other specified personal risk factors, not elsewhere classified: Secondary | ICD-10-CM

## 2024-09-14 DIAGNOSIS — R471 Dysarthria and anarthria: Secondary | ICD-10-CM | POA: Diagnosis not present

## 2024-09-14 DIAGNOSIS — R519 Headache, unspecified: Secondary | ICD-10-CM | POA: Diagnosis not present

## 2024-09-14 DIAGNOSIS — R030 Elevated blood-pressure reading, without diagnosis of hypertension: Secondary | ICD-10-CM | POA: Diagnosis not present

## 2024-09-14 DIAGNOSIS — Z8673 Personal history of transient ischemic attack (TIA), and cerebral infarction without residual deficits: Secondary | ICD-10-CM | POA: Diagnosis not present

## 2024-09-14 DIAGNOSIS — R351 Nocturia: Secondary | ICD-10-CM | POA: Diagnosis not present

## 2024-09-14 NOTE — Patient Instructions (Signed)

## 2024-09-14 NOTE — Progress Notes (Signed)
 Subjective:    Patient ID: [name] is a 69 y.o. female.  HPI    True Mar, MD, PhD [facility] [address], [address] [address] [place], [place] [address]  Dear [place],  I saw your patient, [name], upon your kind request in my sleep clinic today for initial consultation of her sleep disorder, in particular, concern for underlying obstructive sleep apnea.  The patient is unaccompanied today.  As you know, [name] is a 69 year old female with an underlying medical history of headaches, hypertension, iron  deficiency anemia, cerebrovascular disease with recent stroke in November 2025, allergy, back pain, depression, reflux disease, coronary artery disease with history of MI, and overweight state, who reports sleep disruption and morning headaches.  She often goes to bed with a headache.  Her Epworth sleepiness score is 3 out of 24.  I reviewed your office note from 07/23/2024.  She reports nocturia about 3 times per average night.  She drinks caffeine in the form of green tea, about 2 to 3 cups/day.  She does not drink any alcohol.  She quit smoking in August 2025. Of note, she has an elevated blood pressure value at 185/86, she reports that she took her blood pressure medicine this morning, reports some shortness of breath with exertion, but not much different from her day to day symptoms, denies CP, no new symptoms this morning, no HA, feels tired.  She has an appointment with her PCP coming up in 2 days.  She is not sure if she snores.  Her nephew lives with her.  Her Past Medical History Is Significant For: Past Medical History:  Diagnosis Date   Allergy    Anemia 01/07/2020   REQUIRING TRANSFUSION   Back pain    Depression    Diabetes mellitus without complication (HCC)    GERD (gastroesophageal reflux disease)    Hypertension    Myocardial infarction (HCC)    Olecranon bursitis of left elbow    Sprain of left ankle 01/16/2023    Traumatic closed displaced fracture of shaft of right tibia and fibula 06/10/2016    Her Past Surgical History Is Significant For: Past Surgical History:  Procedure Laterality Date   ABDOMINAL HYSTERECTOMY     spared ovaries   OLECRANON BURSECTOMY Left 06/19/2017   Procedure: Excision olecranon bursa;  Surgeon: Yvone Rush, MD;  Location: [facility];  Service: Orthopedics;  Laterality: Left;  Panel Length: 60 mins    TIBIA IM NAIL INSERTION Right 06/10/2016   Procedure: INTRAMEDULLARY (IM) NAIL TIBIAL RODING;  Surgeon: Rush Yvone, MD;  Location: [facility] OR;  Service: Orthopedics;  Laterality: Right;    Her Family History Is Significant For: Family History  Problem Relation Age of Onset   Diabetes Mother    Hypertension Mother    Diabetes Father    Hypertension Father    Breast cancer Sister    Breast cancer Sister    Breast cancer Paternal Aunt    Esophageal cancer Neg Hx    Liver disease Neg Hx    Colon cancer Neg Hx    Migraines Neg Hx    Stroke Neg Hx    Sleep apnea Neg Hx     Her Social History Is Significant For: Social History   Socioeconomic History   Marital status: Single    Spouse name: Not on file   Number of children: 0   Years of education: Not on file   Highest education level: Bachelor's degree (  e.g., BA, AB, BS)  Occupational History   Occupation: retired  Tobacco Use   Smoking status: Former    Current packs/day: 0.00    Average packs/day: 0.5 packs/day for 30.0 years (15.0 ttl pk-yrs)    Types: Cigarettes    Quit date: 02/2024    Years since quitting: 0.5   Smokeless tobacco: Never   Tobacco comments:    started back in 2012, smoking 1/2 PPD - 05/07/2022; 10/02/2023 - continues to try to quit, quit 4 months ago  Vaping Use   Vaping status: Never Used  Substance and Sexual Activity   Alcohol use: No   Drug use: Not Currently    Frequency: 7.0 times per week    Types: Marijuana    Comment: yesterday   Sexual activity: Yes  Other  Topics Concern   Not on file  Social History Narrative   Lives alone but nephew is there sometimes to take care of her   Right handed   Caffeine: was a whole lot but for last 2 weeks has decreased to decaf coffee, no soda    Social Drivers of Health   Tobacco Use: Medium Risk (09/14/2024)   Patient History    Smoking Tobacco Use: Former    Smokeless Tobacco Use: Never    Passive Exposure: Not on file  Financial Resource Strain: Medium Risk (09/13/2024)   Overall Financial Resource Strain (CARDIA)    Difficulty of Paying Living Expenses: Somewhat hard  Food Insecurity: Food Insecurity Present (09/13/2024)   Epic    Worried About Programme Researcher, Broadcasting/film/video in the Last Year: Sometimes true    Ran Out of Food in the Last Year: Sometimes true  Transportation Needs: Patient Declined (09/13/2024)   Epic    Lack of Transportation (Medical): Patient declined    Lack of Transportation (Non-Medical): Patient declined  Physical Activity: Insufficiently Active (09/13/2024)   Exercise Vital Sign    Days of Exercise per Week: 2 days    Minutes of Exercise per Session: 20 min  Stress: No Stress Concern Present (09/13/2024)   [employer] of Occupational Health - Occupational Stress Questionnaire    Feeling of Stress: Only a little  Social Connections: Moderately Integrated (09/13/2024)   Social Connection and Isolation Panel    Frequency of Communication with Friends and Family: More than three times a week    Frequency of Social Gatherings with Friends and Family: More than three times a week    Attends Religious Services: More than 4 times per year    Active Member of Clubs or Organizations: Yes    Attends Banker Meetings: More than 4 times per year    Marital Status: Divorced  Depression (PHQ2-9): Low Risk (08/13/2024)   Depression (PHQ2-9)    PHQ-2 Score: 1  Recent Concern: Depression (PHQ2-9) - Medium Risk (06/23/2024)   Depression (PHQ2-9)    PHQ-2 Score: 9  Alcohol Screen: Low  Risk (10/02/2023)   Alcohol Screen    Last Alcohol Screening Score (AUDIT): 0  Housing: Low Risk (09/13/2024)   Epic    Unable to Pay for Housing in the Last Year: No    Number of Times Moved in the Last Year: 0    Homeless in the Last Year: No  Utilities: Not At Risk (07/30/2024)   Epic    Threatened with loss of utilities: No  Health Literacy: Adequate Health Literacy (10/02/2023)   B1300 Health Literacy    Frequency of need for help with medical instructions:  Never    Her Allergies Are:  Allergies[1]:   Her Current Medications Are:  Outpatient Encounter Medications as of 09/14/2024  Medication Sig   acetaminophen  (TYLENOL ) 325 MG tablet Take 2 tablets (650 mg total) by mouth every 6 (six) hours as needed for up to 30 doses for moderate pain (pain score 4-6), mild pain (pain score 1-3) or headache.   amLODipine  (NORVASC ) 5 MG tablet TAKE 1 TABLET (5 MG TOTAL) BY MOUTH DAILY.   aspirin  EC 81 MG tablet Take 1 tablet (81 mg total) by mouth daily. Swallow whole.   atorvastatin  (LIPITOR) 10 MG tablet TAKE 1 TABLET (10 MG TOTAL) BY MOUTH DAILY FOR CHOLESTEROL   Blood Glucose Monitoring Suppl DEVI 1 each by Does not apply route in the morning, at noon, and at bedtime. May substitute to any manufacturer covered by patient's insurance.   buPROPion  (WELLBUTRIN  XL) 150 MG 24 hr tablet TAKE 1 TABLET (150 MG TOTAL) BY MOUTH EVERY MORNING.   clopidogrel  (PLAVIX ) 75 MG tablet Take 1 tablet (75 mg total) by mouth daily.   dorzolamide -timolol  (COSOPT ) 22.3-6.8 MG/ML ophthalmic solution Place 1 drop into both eyes 2 (two) times daily.   Doxepin  HCl 3 MG TABS Take 1 tablet (3 mg total) by mouth at bedtime as needed.   FEROSUL 325 (65 Fe) MG tablet TAKE 1 TABLET (325 MG TOTAL) BY MOUTH 2 (TWO) TIMES DAILY WITH A MEAL.   Glucose Blood (BLOOD GLUCOSE TEST STRIPS) STRP 1 each by In Vitro route in the morning, at noon, and at bedtime. May substitute to any manufacturer covered by patient's insurance.   Lancets  (ONETOUCH DELICA PLUS LANCET33G) MISC USE TO CHECK BLOOD SUGAR ONCE A DAY AS INSTRUCTED   Lancets MISC 1 each by Does not apply route as directed. Dispense based on patient and insurance preference. Use up to four times daily as directed. (FOR ICD-10 E10.9, E11.9).   metFORMIN  (GLUCOPHAGE ) 500 MG tablet Take 1 tablet (500 mg total) by mouth daily with breakfast.   nortriptyline  (PAMELOR ) 10 MG capsule Take 2 capsules (20 mg total) by mouth at bedtime.   omeprazole  (PRILOSEC) 40 MG capsule Take 1 capsule (40 mg total) by mouth daily.   trimethoprim -polymyxin b (POLYTRIM) ophthalmic solution Place 1 drop into both eyes See admin instructions. Administer 1 drop into each eye 4 times daily for 2 days after eye injections   valsartan  (DIOVAN ) 160 MG tablet Take 1 tablet (160 mg total) by mouth daily.   Vitamin D , Ergocalciferol , (DRISDOL ) 1.25 MG (50000 UNIT) CAPS capsule TAKE 1 CAPSULE (50,000 UNITS TOTAL) BY MOUTH EVERY 7 (SEVEN) DAYS.   No facility-administered encounter medications on file as of 09/14/2024.  :   Review of Systems:  Out of a complete 14 point review of systems, all are reviewed and negative with the exception of these symptoms as listed below:  Review of Systems  Objective:  Neurological Exam  Physical Exam Physical Examination:   Vitals:   09/14/24 0951  BP: (!) 185/86  Pulse: 64   Upon recheck her blood pressure is 160/90 with no additional symptoms reported.  General Examination: The patient is a very pleasant 69 y.o. female in no acute distress. She appears well-developed and well-nourished and well groomed.   HEENT: Normocephalic, atraumatic, pupils are equal, round and reactive to light, extraocular tracking is good without limitation to gaze excursion or nystagmus noted. No photophobia, s/p L cataract repair, has R cataract.  No Corrective eye glasses in place. Hearing is grossly  intact.  Face is mildly asymmetric with R facial weakness. Mild to moderate  dysarthria. There is no hypophonia. There is no lip, neck/head, jaw or voice tremor. Neck is supple with full range of passive and active motion. There are no carotid bruits on auscultation.  Airway/Oropharynx exam reveals: mild mouth dryness, adequate dental hygiene with full dentures. and mild airway crowding, due to smaller airway entry, Mallampati class IV, wider tongue.  Neck circumference 15 3/8 inches.  Tongue protrudes centrally and palate elevates symmetrically.  Chest: Clear to auscultation without wheezing, rhonchi or crackles noted.  Heart: S1+S2+0, regular and normal without murmurs, rubs or gallops noted.   Abdomen: Soft, non-tender and non-distended.  Extremities: There is no pitting edema in the distal lower extremities bilaterally.   Skin: Warm and dry without trophic changes noted.   Musculoskeletal: exam reveals no obvious joint deformities.   Neurologically:  Mental status: The patient is awake, alert and oriented in all 4 spheres. Her immediate and remote memory, attention, language skills and fund of knowledge are appropriate. There is no evidence of aphasia, agnosia, apraxia or anomia. Speech is clear with normal prosody and enunciation. Thought process is linear. Mood is normal and affect is normal.  Cranial nerves II - XII are as described above under HEENT exam.  Motor exam: Normal bulk, strength normal on the left, minimal weakness in the right upper and right lower extremities.  There is no obvious action or resting tremor.  Fine motor skills and coordination: Intact grossly.  Cerebellar testing: No dysmetria or intention tremor. There is no truncal or gait ataxia.  Sensory exam: intact to light touch in the upper and lower extremities.  Gait, station and balance: She stands with mild difficulty and stand slightly wider base.  She walks with a rolling walker.  Posture mildly stooped.   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 A laboratory attended sleep study is typically considered gold standard for evaluation of sleep disordered breathing.   I had a long chat with the patient about my findings and the diagnosis of sleep apnea, particularly OSA, its prognosis and treatment options. We talked about medical/conservative treatments, surgical interventions and non-pharmacological approaches for symptom control. I explained, in particular, the risks and ramifications of untreated moderate to severe OSA, especially with respect to developing cardiovascular disease down the road, including congestive heart failure (CHF), difficult to treat hypertension, cardiac arrhythmias (particularly A-fib), neurovascular complications including TIA, stroke and dementia. Even type 2 diabetes has, in part, been linked to untreated OSA. Symptoms of untreated OSA may include (but may not be limited to) daytime sleepiness, nocturia (i.e. frequent nighttime urination), memory problems, mood irritability and suboptimally controlled or worsening mood disorder such as depression and/or anxiety, lack of energy, lack of motivation, physical discomfort, as well as recurrent headaches, especially morning or nocturnal headaches. We talked about the importance of maintaining a healthy lifestyle and striving for healthy weight.  I recommended a sleep study at this time. I outlined the differences between a laboratory attended sleep study which is considered more comprehensive and accurate over the option of a home sleep test (HST); the  latter may lead to underestimation  of sleep disordered breathing in some instances and does not help with diagnosing upper airway resistance syndrome and is not accurate enough to diagnose primary central sleep apnea typically. I outlined possible surgical and non-surgical treatment options of OSA, including the use of a positive airway pressure (PAP) device (i.e. CPAP, AutoPAP/APAP or BiPAP in certain circumstances), a custom-made dental device (aka oral appliance, which would require a referral to a specialist dentist or orthodontist typically, and is generally speaking not considered for patients with full dentures or edentulous state), upper airway surgical options, such as traditional UPPP (which is not considered a first-line treatment) or the Inspire device (hypoglossal nerve stimulator, which would involve a referral for consultation with an ENT surgeon, after careful selection, following inclusion criteria - also not first-line treatment). I explained the PAP treatment option to the patient in detail, as this is generally considered first-line treatment.  The patient indicated that she would be willing to try PAP therapy, if the need arises. I explained the importance of being compliant with PAP treatment, not only for insurance purposes but primarily to improve patient's symptoms symptoms, and for the patient's long term health benefit, including to reduce Her cardiovascular risks longer-term.    We will pick up our discussion about the next steps and treatment options after testing.  We will keep her posted as to the test results by phone call and/or MyChart messaging where possible.  We will plan to follow-up in sleep clinic accordingly as well.  I answered all her questions today and the patient was in agreement.  She is advised to get in touch with her PCP if she should have ongoing issues with elevated blood pressure.  She has no significant or new symptoms with her elevated readings today.  If she develops any new symptoms such as  shortness of breath, chest pain, blurry vision or severe headache, she is advised to call 911.  She came via [place] today. I encouraged her to call with any interim questions, concerns, problems or updates or email us  through MyChart.  Generally speaking, sleep test authorizations may take up to 2 weeks, sometimes less, sometimes longer, the patient is encouraged to get in touch with us  if they do not hear back from the sleep lab staff directly within the next 2 weeks.  Thank you very much for allowing me to participate in the care of this nice patient. If I can be of any further assistance to you please do not hesitate to talk to me.  Sincerely,   True Mar, MD, PhD     [1] No Known Allergies

## 2024-09-15 ENCOUNTER — Ambulatory Visit: Attending: Internal Medicine

## 2024-09-15 DIAGNOSIS — I6389 Other cerebral infarction: Secondary | ICD-10-CM

## 2024-09-16 ENCOUNTER — Encounter: Payer: Self-pay | Admitting: Nurse Practitioner

## 2024-09-16 ENCOUNTER — Ambulatory Visit: Payer: Self-pay | Admitting: Nurse Practitioner

## 2024-09-16 VITALS — BP 130/84 | HR 92 | Temp 98.7°F | Ht 65.0 in | Wt 186.6 lb

## 2024-09-16 DIAGNOSIS — I10 Essential (primary) hypertension: Secondary | ICD-10-CM

## 2024-09-16 DIAGNOSIS — R519 Headache, unspecified: Secondary | ICD-10-CM

## 2024-09-16 DIAGNOSIS — E1159 Type 2 diabetes mellitus with other circulatory complications: Secondary | ICD-10-CM | POA: Diagnosis not present

## 2024-09-16 MED ORDER — SEMAGLUTIDE(0.25 OR 0.5MG/DOS) 2 MG/3ML ~~LOC~~ SOPN: 0.5000 mg | PEN_INJECTOR | SUBCUTANEOUS | 1 refills | Status: AC

## 2024-09-16 NOTE — Progress Notes (Signed)
 LILLETTE Kristeen JINNY Gladis, CMA,acting as a neurosurgeon for Kristin Ada, FNP.,have documented all relevant documentation on the behalf of Kristin Ada, FNP,as directed by  Kristin Ada, FNP while in the presence of Kristin Ada, FNP.  Subjective:  Patient ID: [name] , female    DOB: February 12, 1956 , 69 y.o.   MRN: [id number]  Chief Complaint  Patient presents with   Headache    Patient reports she continues to have headaches, she reports she saw neurology on 09/15/2023. Patient states she informed the neurologist about her headaches and they suggested sleep study.     HPI Discussed the use of AI scribe software for clinical note transcription with the patient, who gave verbal consent to proceed.  History of Present Illness [name] is a 69 year old female with a history of stroke who presents with persistent headaches. She is accompanied by her sister, [name].  She has been experiencing persistent headaches since her hospitalization, described as aching and throbbing, primarily located at the back of her head. The severity of the headaches is rated as seven out of ten. Despite various treatments, including nortriptyline , Tylenol , Ubrelvy , Nurtec, and a steroid shot, she has not found significant relief. Toradol  provided minimal relief, but the headaches returned the following day.  She has been taking nortriptyline  since November 13, initially one capsule in the morning and one in the evening, but for the past two weeks, she has been taking two capsules at night. Despite this adjustment, her headaches have not significantly improved.  She experiences dizziness at times. She has undergone two MRIs and a heart study, although the heart monitor did not function properly at home. She is awaiting a sleep study to evaluate the possibility of sleep apnea contributing to her headaches.  She is currently on multiple medications, taking eight pills in the morning and two to three in the evening,  including iron  supplements.  She is here with her sister [name].  Continues with the headaches.      Past Medical History:  Diagnosis Date   Allergy    Anemia 01/07/2020   REQUIRING TRANSFUSION   Back pain    Depression    Diabetes mellitus without complication (HCC)    GERD (gastroesophageal reflux disease)    Hypertension    Myocardial infarction (HCC)    Olecranon bursitis of left elbow    Sprain of left ankle 01/16/2023   Traumatic closed displaced fracture of shaft of right tibia and fibula 06/10/2016     Family History  Problem Relation Age of Onset   Diabetes Mother    Hypertension Mother    Diabetes Father    Hypertension Father    Breast cancer Sister    Breast cancer Sister    Breast cancer Paternal Aunt    Esophageal cancer Neg Hx    Liver disease Neg Hx    Colon cancer Neg Hx    Migraines Neg Hx    Stroke Neg Hx    Sleep apnea Neg Hx     Current Medications[1]   Allergies[2]   Review of Systems  Constitutional: Negative.   Respiratory: Negative.    Cardiovascular: Negative.   Gastrointestinal:  Negative for nausea.  Musculoskeletal: Negative.   Neurological: Negative.   Psychiatric/Behavioral: Negative.       Today's Vitals   09/16/24 1449  BP: 130/84  Pulse: 92  Temp: 98.7 F (37.1 C)  TempSrc: Oral  SpO2: 98%  Weight: 186 lb 9.6 oz (84.6 kg)  Height: 5' 5 (1.651 m)  PainSc: 8   PainLoc: Back   Body mass index is 31.05 kg/m.  Wt Readings from Last 3 Encounters:  09/25/24 180 lb 9.6 oz (81.9 kg)  09/24/24 179 lb (81.2 kg)  09/16/24 186 lb 9.6 oz (84.6 kg)    The ASCVD Risk score (Arnett DK, et al., 2019) failed to calculate for the following reasons:   Risk score cannot be calculated because patient has a medical history suggesting prior/existing ASCVD   * - Cholesterol units were assumed  Objective:  Physical Exam Vitals and nursing note reviewed.  Constitutional:      General: She is not in acute distress.     Appearance: Normal appearance. She is well-developed.  HENT:     Head: Normocephalic and atraumatic.     Comments: Pain to frontal area of head bilaterally Cardiovascular:     Rate and Rhythm: Normal rate and regular rhythm.     Pulses: Normal pulses.     Heart sounds: Normal heart sounds. No murmur heard. Pulmonary:     Effort: Pulmonary effort is normal. No respiratory distress.     Breath sounds: Normal breath sounds. No wheezing.  Musculoskeletal:     Comments: Rollator walker  Skin:    General: Skin is warm and dry.     Capillary Refill: Capillary refill takes less than 2 seconds.  Neurological:     General: No focal deficit present.     Mental Status: She is alert and oriented to person, place, and time.     Cranial Nerves: No cranial nerve deficit.     Motor: No weakness.  Psychiatric:        Mood and Affect: Mood normal.        Behavior: Behavior normal.        Thought Content: Thought content normal.        Judgment: Judgment normal.         Assessment And Plan:   Assessment & Plan Frequent headaches Chronic occipital headaches unresponsive to nortriptyline , Ubrelvy , Nurtec, and Toradol . Differential includes tension-type headache and new onset migraines. Awaiting sleep study for sleep apnea evaluation. Discussed topiramate and Qulipta  for migraine management. Qulipta  considered benign with minimal side effects. - Communicated with neurologist for further management suggestions. - Consider topiramate for migraine management. - Provide Qulipta  samples if available for trial. - Await sleep study results for sleep apnea evaluation. Type 2 diabetes mellitus with other circulatory complications (HCC) HgbA1c has been stable, continue current medications.  Essential hypertension Blood pressure is fairly controlled. Continue current medications  No orders of the defined types were placed in this encounter.     No follow-ups on file.  Patient was given opportunity to  ask questions. Patient verbalized understanding of the plan and was able to repeat key elements of the plan. All questions were answered to their satisfaction.    LILLETTE Kristin Ada, FNP, have reviewed all documentation for this visit. The documentation on 09/16/24 for the exam, diagnosis, procedures, and orders are all accurate and complete.   IF YOU HAVE BEEN REFERRED TO A SPECIALIST, IT MAY TAKE 1-2 WEEKS TO SCHEDULE/PROCESS THE REFERRAL. IF YOU HAVE NOT HEARD FROM US /SPECIALIST IN TWO WEEKS, PLEASE GIVE US  A CALL AT [phone].      [1]  Current Outpatient Medications:    acetaminophen  (TYLENOL ) 325 MG tablet, Take 2 tablets (650 mg total) by mouth every 6 (six) hours as needed for up to 30 doses for  moderate pain (pain score 4-6), mild pain (pain score 1-3) or headache., Disp: 30 tablet, Rfl: 0   amLODipine  (NORVASC ) 5 MG tablet, TAKE 1 TABLET (5 MG TOTAL) BY MOUTH DAILY., Disp: 90 tablet, Rfl: 1   aspirin  EC 81 MG tablet, Take 1 tablet (81 mg total) by mouth daily. Swallow whole. (Patient not taking: Reported on 09/25/2024), Disp: 21 tablet, Rfl: 0   atorvastatin  (LIPITOR) 10 MG tablet, TAKE 1 TABLET (10 MG TOTAL) BY MOUTH DAILY FOR CHOLESTEROL, Disp: 90 tablet, Rfl: 1   Blood Glucose Monitoring Suppl DEVI, 1 each by Does not apply route in the morning, at noon, and at bedtime. May substitute to any manufacturer covered by patient's insurance., Disp: 1 each, Rfl: 0   buPROPion  (WELLBUTRIN  XL) 150 MG 24 hr tablet, TAKE 1 TABLET (150 MG TOTAL) BY MOUTH EVERY MORNING., Disp: 30 tablet, Rfl: 2   dorzolamide -timolol  (COSOPT ) 22.3-6.8 MG/ML ophthalmic solution, Place 1 drop into both eyes 2 (two) times daily., Disp: , Rfl:    Doxepin  HCl 3 MG TABS, Take 1 tablet (3 mg total) by mouth at bedtime as needed., Disp: 30 tablet, Rfl: 3   FEROSUL 325 (65 Fe) MG tablet, TAKE 1 TABLET (325 MG TOTAL) BY MOUTH 2 (TWO) TIMES DAILY WITH A MEAL., Disp: 60 tablet, Rfl: 3   Glucose Blood (BLOOD GLUCOSE TEST  STRIPS) STRP, 1 each by In Vitro route in the morning, at noon, and at bedtime. May substitute to any manufacturer covered by patient's insurance., Disp: 100 strip, Rfl: 2   Lancets (ONETOUCH DELICA PLUS LANCET33G) MISC, USE TO CHECK BLOOD SUGAR ONCE A DAY AS INSTRUCTED, Disp: 100 each, Rfl: 2   Lancets MISC, 1 each by Does not apply route as directed. Dispense based on patient and insurance preference. Use up to four times daily as directed. (FOR ICD-10 E10.9, E11.9)., Disp: 100 each, Rfl: 0   nortriptyline  (PAMELOR ) 10 MG capsule, Take 2 capsules (20 mg total) by mouth at bedtime., Disp: 60 capsule, Rfl: 11   omeprazole  (PRILOSEC) 40 MG capsule, Take 1 capsule (40 mg total) by mouth daily., Disp: 30 capsule, Rfl: 6   Semaglutide ,0.25 or 0.5MG /DOS, 2 MG/3ML SOPN, Inject 0.5 mg into the skin once a week., Disp: 9 mL, Rfl: 1   trimethoprim -polymyxin b (POLYTRIM) ophthalmic solution, Place 1 drop into both eyes See admin instructions. Administer 1 drop into each eye 4 times daily for 2 days after eye injections, Disp: , Rfl:    valsartan  (DIOVAN ) 160 MG tablet, Take 1 tablet (160 mg total) by mouth daily., Disp: 90 tablet, Rfl: 2   Vitamin D , Ergocalciferol , (DRISDOL ) 1.25 MG (50000 UNIT) CAPS capsule, TAKE 1 CAPSULE (50,000 UNITS TOTAL) BY MOUTH EVERY 7 (SEVEN) DAYS., Disp: 12 capsule, Rfl: 0   Atogepant  (QULIPTA ) 60 MG TABS, Take 60 mg by mouth daily., Disp: , Rfl:    Atogepant  (QULIPTA ) 60 MG TABS, Take 1 tablet (60 mg total) by mouth daily., Disp: 30 tablet, Rfl: 11   clopidogrel  (PLAVIX ) 75 MG tablet, Take 1 tablet (75 mg total) by mouth daily., Disp: 30 tablet, Rfl: 11   metFORMIN  (GLUCOPHAGE ) 500 MG tablet, TAKE 1 TABLET (500 MG TOTAL) BY MOUTH DAILY WITH BREAKFAST., Disp: 90 tablet, Rfl: 1   prednisoLONE acetate (PRED FORTE) 1 % ophthalmic suspension, Place 1 drop into the right eye 4 (four) times daily., Disp: , Rfl:  [2] No Known Allergies

## 2024-09-18 ENCOUNTER — Telehealth: Payer: Self-pay | Admitting: Neurology

## 2024-09-18 NOTE — Telephone Encounter (Signed)
 NPGS [employer] pending

## 2024-09-22 ENCOUNTER — Other Ambulatory Visit: Payer: Self-pay

## 2024-09-22 NOTE — Patient Outreach (Signed)
 Complex Care Management   Visit Note  09/22/2024  Name:  [name] MRN: [id number] DOB: March 01, 1956  Situation: Referral received for Complex Care Management related to Type 2 diabetes mellitus with other circulatory complications, CVA, Essential Hypertension, Chronic back pain. I obtained verbal consent from Patient.  Visit completed with Patient on the phone.  Background:   Past Medical History:  Diagnosis Date   Allergy    Anemia 01/07/2020   REQUIRING TRANSFUSION   Back pain    Depression    Diabetes mellitus without complication (HCC)    GERD (gastroesophageal reflux disease)    Hypertension    Myocardial infarction (HCC)    Olecranon bursitis of left elbow    Sprain of left ankle 01/16/2023   Traumatic closed displaced fracture of shaft of right tibia and fibula 06/10/2016    Assessment: Patient Reported Symptoms:  Cognitive Cognitive Status: Able to follow simple commands, Normal speech and language skills Cognitive/Intellectual Conditions Management [RPT]: None reported or documented in medical history or problem list   Health Maintenance Behaviors: Annual physical exam Health Facilitated by: Rest, Pain control, Healthy diet  Neurological Neurological Review of Symptoms: Headaches, Weakness Neurological Management Strategies: Medication therapy, Adequate rest, Routine screening Neurological Self-Management Outcome: 3 (uncertain)  HEENT HEENT Symptoms Reported: Sudden change or loss of vision HEENT Management Strategies: Adequate rest, Medication therapy, Routine screening HEENT Self-Management Outcome: 2 (bad)    Cardiovascular Cardiovascular Symptoms Reported: Dizziness, Fatigue Does patient have uncontrolled Hypertension?: Yes Is patient checking Blood Pressure at home?: Yes Patient's Recent BP reading at home: 143/78    Respiratory Respiratory Symptoms Reported: No symptoms reported    Endocrine Endocrine Symptoms Reported: No symptoms reported Is  patient diabetic?: Yes Is patient checking blood sugars at home?: Yes List most recent blood sugar readings, include date and time of day: FBS 115 on 09/21/23 Endocrine Self-Management Outcome: 4 (good)  Gastrointestinal Gastrointestinal Symptoms Reported: Constipation Gastrointestinal Management Strategies: Fluid modification, Medication therapy Gastrointestinal Self-Management Outcome: 4 (good)    Genitourinary Genitourinary Symptoms Reported: Frequency, Other Other Genitourinary Symptoms: nocturia Genitourinary Management Strategies: Fluid modification Genitourinary Self-Management Outcome: 4 (good)  Integumentary Integumentary Symptoms Reported: No symptoms reported    Musculoskeletal Musculoskelatal Symptoms Reviewed: Weakness, Difficulty walking, Unsteady gait, Limited mobility Additional Musculoskeletal Details: ambulates with rollator Musculoskeletal Management Strategies: Adequate rest, Routine screening, Medical device, Exercise Musculoskeletal Self-Management Outcome: 4 (good) Falls in the past year?: No Number of falls in past year: 1 or less Was there an injury with Fall?: No Fall Risk Category Calculator: 0 Patient Fall Risk Level: Low Fall Risk Patient at Risk for Falls Due to: Impaired balance/gait, Impaired mobility Fall risk Follow up: Education provided, Falls evaluation completed, Falls prevention discussed  Psychosocial Psychosocial Symptoms Reported: Alteration in sleep habits Behavioral Management Strategies: Support system, Adequate rest (routine screening) Behavioral Health Self-Management Outcome: 3 (uncertain) Behavioral Health Comment: patient is awaiting on a sleep study to be scheduled Major Change/Loss/Stressor/Fears (CP): Medical condition, self Techniques to Cope with Loss/Stress/Change: Diversional activities Quality of Family Relationships: helpful, involved, supportive Do you feel physically threatened by others?: No    09/22/2024    PHQ2-9  Depression Screening   Williette Loewe interest or pleasure in doing things    Feeling down, depressed, or hopeless    PHQ-2 - Total Score    Trouble falling or staying asleep, or sleeping too much    Feeling tired or having Juwaun Inskeep energy    Poor appetite or overeating     Feeling bad about  yourself - or that you are a failure or have let yourself or your family down    Trouble concentrating on things, such as reading the newspaper or watching television    Moving or speaking so slowly that other people could have noticed.  Or the opposite - being so fidgety or restless that you have been moving around a lot more than usual    Thoughts that you would be better off dead, or hurting yourself in some way    PHQ2-9 Total Score    If you checked off any problems, how difficult have these problems made it for you to do your work, take care of things at home, or get along with other people    Depression Interventions/Treatment      There were no vitals filed for this visit. Pain Scale: 0-10 Pain Score: 8  Pain Type: Chronic pain Pain Location: Head Pain Orientation: Posterior Pain Descriptors / Indicators: Sharp, Moaning Pain Onset: Progressive Pain Intervention(s): Medication (See eMAR), Hot/Cold interventions Multiple Pain Sites: No  Medications Reviewed Today     Reviewed by Morgan Clayborne CROME, RN (Registered Nurse) on 09/22/24 at 1351  Med List Status: <None>   Medication Order Taking? Sig Documenting Provider Last Dose Status Informant  acetaminophen  (TYLENOL ) 325 MG tablet [id number]  Take 2 tablets (650 mg total) by mouth every 6 (six) hours as needed for up to 30 doses for moderate pain (pain score 4-6), mild pain (pain score 1-3) or headache.  Patient not taking: Reported on 09/22/2024   Cottie Donnice PARAS, MD  Active Self, Pharmacy Records  amLODipine  (NORVASC ) 5 MG tablet [id number] Yes TAKE 1 TABLET (5 MG TOTAL) BY MOUTH DAILY. Georgina Speaks, FNP  Active Self, Pharmacy Records  aspirin  EC 81  MG tablet [id number] Yes Take 1 tablet (81 mg total) by mouth daily. Swallow whole. Toberman, Stevi W, NP  Consider Medication Status and Discontinue (Completed Course)   Atogepant  (QULIPTA ) 60 MG TABS [id number] Yes Take 60 mg by mouth daily. [provider]  Active   atorvastatin  (LIPITOR) 10 MG tablet [id number] Yes TAKE 1 TABLET (10 MG TOTAL) BY MOUTH DAILY FOR CHOLESTEROL Georgina Speaks, FNP  Active Self, Pharmacy Records  Blood Glucose Monitoring Suppl DEVI [id number] Yes 1 each by Does not apply route in the morning, at noon, and at bedtime. May substitute to any manufacturer covered by patient's insurance. Georgina Speaks, FNP  Active   buPROPion  (WELLBUTRIN  XL) 150 MG 24 hr tablet [id number] Yes TAKE 1 TABLET (150 MG TOTAL) BY MOUTH EVERY MORNING. Georgina Speaks, FNP  Active   clopidogrel  (PLAVIX ) 75 MG tablet [id number] Yes Take 1 tablet (75 mg total) by mouth daily. Toberman, Stevi W, NP  Active   dorzolamide -timolol  (COSOPT ) 22.3-6.8 MG/ML ophthalmic solution [id number] Yes Place 1 drop into both eyes 2 (two) times daily. [provider]  Active Self, Pharmacy Records  Doxepin  HCl 3 MG TABS [id number] Yes Take 1 tablet (3 mg total) by mouth at bedtime as needed. Georgina Speaks, FNP  Active Self, Pharmacy Records  FEROSUL 325 [phone] Fe) MG tablet [id number] Yes TAKE 1 TABLET (325 MG TOTAL) BY MOUTH 2 (TWO) TIMES DAILY WITH A MEAL. Georgina Speaks, FNP  Active Self, Pharmacy Records  Glucose Blood (BLOOD GLUCOSE TEST STRIPS) STRP [id number] Yes 1 each by In Vitro route in the morning, at noon, and at bedtime. May substitute to any manufacturer covered by patient's insurance. Georgina Speaks, FNP  Active   Lancets [facility] DELICA PLUS [place]) MISC  [id number] Yes USE TO CHECK BLOOD SUGAR ONCE A DAY AS INSTRUCTED Georgina Speaks, FNP  Active Self, Pharmacy Records  Lancets MISC [id number] Yes 1 each by Does not apply route as directed. Dispense based on patient and insurance preference. Use up to four  times daily as directed. (FOR ICD-10 E10.9, E11.9). Georgina Speaks, FNP  Active   metFORMIN  (GLUCOPHAGE ) 500 MG tablet [id number] Yes Take 1 tablet (500 mg total) by mouth daily with breakfast. Georgina Speaks, FNP  Active Self, Pharmacy Records  nortriptyline  (PAMELOR ) 10 MG capsule [id number] Yes Take 2 capsules (20 mg total) by mouth at bedtime. Onita Duos, MD  Active Self, Pharmacy Records  omeprazole  [facility]) 40 MG capsule [id number] Yes Take 1 capsule (40 mg total) by mouth daily. Federico Rosario BROCKS, MD  Active Self, Pharmacy Records  Semaglutide ,0.25 or 0.5MG /DOS, 2 MG/3ML NELMA [id number]  Inject 0.5 mg into the skin once a week.  Patient not taking: Reported on 09/22/2024   Georgina Speaks, FNP  Active   trimethoprim -polymyxin b (POLYTRIM) ophthalmic solution [id number] Yes Place 1 drop into both eyes See admin instructions. Administer 1 drop into each eye 4 times daily for 2 days after eye injections [provider]  Active Self, Pharmacy Records  valsartan  (DIOVAN ) 160 MG tablet [id number] Yes Take 1 tablet (160 mg total) by mouth daily. Georgina Speaks, FNP  Active Self, Pharmacy Records  Vitamin D , Ergocalciferol , (DRISDOL ) 1.25 MG (50000 UNIT) CAPS capsule [id number] Yes TAKE 1 CAPSULE (50,000 UNITS TOTAL) BY MOUTH EVERY 7 (SEVEN) DAYS. Georgina Speaks, FNP  Active             Recommendation:   Specialty provider follow-up   09/24/2024 Status: Sch   Time: 3:00 PM Length: 20  Visit Type: OFFICE VISIT [1004] Copay: $40.00  Provider: Loni Soyla LABOR, MD Department: [facility]    09/25/2024 Status: Sch   Time: 10:30 AM Length: 30  Visit Type: HOSPITAL FU [111] Copay: $40.00  Provider: Onita Duos, MD Department: [facility]   Follow Up Plan:   Telephone follow up appointment date/time  10/05/2024 Status: Sch   Time: 1:00 PM Length: 30  Visit Type: VBCI TELEPHONE CALL 30 [2502] Copay: $0.00  Provider: Ezzard Rolin BIRCH, LCSW Department: [facility]    10/06/2024 Status: Sch   Time: 11:00 AM Length: 30  Visit Type: VBCI TELEPHONE CALL 30 [2502] Copay: $0.00  Provider: Morgan Clayborne CROME, RN Department: [facility]   Clayborne Morgan RN BSN CCM [place]  [facility], [facility] Nurse Care Coordinator  Direct Dial: [phone] Website: [email or web address]

## 2024-09-24 ENCOUNTER — Encounter: Payer: Self-pay | Admitting: Internal Medicine

## 2024-09-24 ENCOUNTER — Ambulatory Visit: Attending: Internal Medicine | Admitting: Internal Medicine

## 2024-09-24 ENCOUNTER — Ambulatory Visit: Admitting: Internal Medicine

## 2024-09-24 VITALS — BP 173/87 | HR 77 | Ht 65.0 in | Wt 179.0 lb

## 2024-09-24 DIAGNOSIS — I6389 Other cerebral infarction: Secondary | ICD-10-CM | POA: Diagnosis not present

## 2024-09-24 DIAGNOSIS — I679 Cerebrovascular disease, unspecified: Secondary | ICD-10-CM

## 2024-09-24 DIAGNOSIS — I10 Essential (primary) hypertension: Secondary | ICD-10-CM | POA: Diagnosis not present

## 2024-09-24 DIAGNOSIS — I119 Hypertensive heart disease without heart failure: Secondary | ICD-10-CM

## 2024-09-24 DIAGNOSIS — I639 Cerebral infarction, unspecified: Secondary | ICD-10-CM

## 2024-09-24 NOTE — Telephone Encounter (Signed)
 NPSG [name]: [id number] (exp. 09/18/24 to 12/12/24)

## 2024-09-24 NOTE — Progress Notes (Signed)
 " Cardiology Office Note:  .   Date:  09/24/2024  ID:  [name], DOB 1955/11/21, MRN [id number] PCP: Georgina Speaks, FNP  [facility] Providers Cardiologist:  Soyla DELENA Merck, MD    History of Present Illness: .   [name] is a 69 y.o. female.  Discussed the use of AI scribe software for clinical note transcription with the patient, who gave verbal consent to proceed.  History of Present Illness [name] is a 69 year old female with cryptogenic stroke who presents for cardiovascular evaluation.  She has a cryptogenic stroke from November 2025. Neurology noted small vessel changes on brain imaging. A heart monitor showed no atrial fibrillation. Echocardiogram showed normal systolic function with severe LVH.  She has persistent headaches that started before the stroke and have continued. Tylenol  used during hospitalization did not help. Headaches are not associated with significant blood pressure changes on her home checks. She does not use daily headache medication and does not have medication overuse headaches.  She has hypertension. Home blood pressure readings are generally well controlled, but clinic readings are elevated and suggest white coat effect.  She denies prior coronary artery disease or myocardial infarction despite this being listed in her record. She quit smoking 6 months ago after multiple prior attempts.    ROS: negative except per HPI above.  Studies Reviewed: SABRA   EKG Interpretation Date/Time:  Thursday September 24 2024 14:55:21 EST Ventricular Rate:  75 PR Interval:  142 QRS Duration:  78 QT Interval:  382 QTC Calculation: 426 R Axis:   -12  Text Interpretation: Sinus rhythm with Premature atrial complexes in a pattern of bigeminy Low voltage QRS When compared with ECG of 30-Jul-2024 13:27, pacs now present Confirmed by Merck Soyla ([id number]) on 09/24/2024 3:35:07 PM    Results Radiology Brain MRI (07/2024): Chronic  small vessel ischemic changes; cryptogenic infarct.  Diagnostic Carotid ultrasound: No significant abnormalities. Echocardiogram (07/2024): Normal left ventricular systolic function; left ventricular hypertrophy; right heart normal; small pericardial effusion; cardiac valves normal; no significant change from prior study. Ambulatory cardiac monitor (30-day) (09/2024): No atrial fibrillation; normal rhythm; occasional premature beats; heart rate low-normal. Electrocardiogram (EKG) (09/24/2024): Normal; occasional premature beats. (Independently interpreted) Risk Assessment/Calculations:       Physical Exam:   VS:  BP (!) 173/87   Pulse 77   Ht 5' 5 (1.651 m)   Wt 179 lb (81.2 kg)   SpO2 99%   BMI 29.79 kg/m    Wt Readings from Last 3 Encounters:  09/25/24 180 lb 9.6 oz (81.9 kg)  09/24/24 179 lb (81.2 kg)  09/16/24 186 lb 9.6 oz (84.6 kg)     Physical Exam VITALS: BP- 173/87 GENERAL: Alert, cooperative, well developed, no acute distress. HEENT: Normocephalic, normal oropharynx, moist mucous membranes. CHEST: Clear to auscultation bilaterally, no wheezes, rhonchi, or crackles. CARDIOVASCULAR: Normal heart rate and rhythm, S1 and S2 normal without murmurs. ABDOMEN: Soft, non-tender, non-distended, without organomegaly, normal bowel sounds. EXTREMITIES: No cyanosis or edema. NEUROLOGICAL: Cranial nerves grossly intact, moves all extremities without gross motor or sensory deficit.   ASSESSMENT AND PLAN: .    Assessment and Plan Assessment & Plan Cryptogenic cerebral infarction (recent stroke) Recent cryptogenic stroke with no clear etiology. Small vessel changes suggest possible minor blockages. No atrial fibrillation detected. Carotid arteries and echocardiogram normal. Normal heart function with left ventricular hypertrophy likely due to hypertension. - Scheduled TEE on February 10th to evaluate for cardiac sources of emboli. -  Coordinated with EP for loop recorder  implantation if TEE is inconclusive. - on plavix  and aspirin  per neurology.   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 Blood pressure higher in clinical settings, suggesting possible white coat hypertension. Home readings reportedly better. No significant changes in heart function on recent echocardiogram. - Continue current antihypertensive regimen: amlodipine  5 mg daily, valsartan  160 mg daily.  - Monitor blood pressure regularly at home  Screening for atrial fibrillation post-stroke No atrial fibrillation detected on 30-day heart monitor. Consideration for long-term monitoring with a loop recorder to rule out intermittent atrial fibrillation as a source of stroke. - Referred to electrophysiology for evaluation and potential implantation of a loop recorder.    Informed Consent   Shared Decision Making/Informed Consent   The risks [esophageal damage, perforation (1:10,000 risk), bleeding, pharyngeal hematoma as well as other potential complications associated with conscious sedation including aspiration, arrhythmia, respiratory failure and death], benefits (treatment guidance and diagnostic support) and alternatives of a transesophageal echocardiogram were discussed in detail with Ms. Chaplin and she is willing to proceed.         Soyla Merck, MD, [facility] "

## 2024-09-24 NOTE — Patient Instructions (Addendum)
 Medication Instructions:  No Changes (see specific med instructions in preparation for TEE)  Lab Work: Today: CBC and BMET  If you have labs (blood work) drawn today and your tests are completely normal, you will receive your results only by: MyChart Message (if you have MyChart) OR A paper copy in the mail If you have any lab test that is abnormal or we need to change your treatment, we will call you to review the results.  Testing/Procedures:   Dear [name]  You are scheduled for a TEE (Transesophageal Echocardiogram) on Tuesday, February 10 with Dr. Soyla Merck.  Please arrive at the [facility] (Main Entrance A) at [facility]: [address] [place], [place] [address] at 7:00 AM (This time is 1 hour(s) before your procedure to ensure your preparation).   Free valet parking service is available. You will check in at ADMITTING.   *Please Note: You will receive a call the day before your procedure to confirm the appointment time. That time may have changed from the original time based on the schedule for that day.*   DIET:  Nothing to eat or drink after midnight except a sip of water with medications (see medication instructions below)  MEDICATION INSTRUCTIONS: !!IF ANY NEW MEDICATIONS ARE STARTED AFTER TODAY, PLEASE NOTIFY YOUR PROVIDER AS SOON AS POSSIBLE!!  FYI: Medications such as Semaglutide  (Ozempic , Wegovy ), Tirzepatide (Mounjaro, Zepbound), Dulaglutide (Trulicity), etc (GLP1 agonists) AND Canagliflozin (Invokana), Dapagliflozin (Farxiga), Empagliflozin  (Jardiance ), Ertugliflozin (Steglatro), Bexagliflozin Arboriculturist) or any combination with one of these drugs such as Invokamet (Canagliflozin/Metformin ), Synjardy (Empagliflozin /Metformin ), etc (SGLT2 inhibitors) must be held around the time of a procedure. This is not a comprehensive list of all of these drugs. Please review all of your medications and talk to your provider if you take any one of these. If  you are not sure, ask your provider.           HOLD: Semaglutide  (Ozempic , Rybelsus , Wegovy ) for 1 week prior to the procedure.  (Do not start this medication until after your procedure)         HOLD: Metformin  the day of your procedure   Please CONTINUE TAKING your Plavix  and Aspirin  and make sure you take them on the day of your procedure   LABS:  CBC and BMET done after office visit on  09/24/24   FYI:  For your safety, and to allow us  to monitor your vital signs accurately during the surgery/procedure we request: If you have artificial nails, gel coating, SNS etc, please have those removed prior to your surgery/procedure. Not having the nail coverings /polish removed may result in cancellation or delay of your surgery/procedure.  Your support person will be asked to wait in the waiting room during your procedure.  It is OK to have someone drop you off and come back when you are ready to be discharged.  You cannot drive after the procedure and will need someone to drive you home.  Bring your insurance cards.  *Special Note: Every effort is made to have your procedure done on time. Occasionally there are emergencies that occur at the hospital that may cause delays. Please be patient if a delay does occur.      Follow-Up: At [facility], you and your health needs are our priority.  As part of our continuing mission to provide you with exceptional heart care, our providers are all part of one team.  This team includes your primary Cardiologist (physician) and Advanced Practice  Providers or APPs (Physician Assistants and Nurse Practitioners) who all work together to provide you with the care you need, when you need it.  Your next appointment:   6 month(s) (Due July 2026)  Provider:   Gayatri A Acharya, MD

## 2024-09-24 NOTE — Patient Instructions (Addendum)
 Visit Information  Thank you for taking time to visit with me today. Please don't hesitate to contact me if I can be of assistance to you before our next scheduled appointment.  Our next appointment is by telephone on Tuesday, January 27 at 11:00 AM Please call the care guide team at [phone] if you need to cancel or reschedule your appointment.   Following is a copy of your care plan:   Goals Addressed             This Visit's Progress    VBCI RN Care Plan related to frequent headaches with dizziness       Problems:  Chronic Disease Management support and education needs related to frequent headaches with dizziness   Goal: Over the next 90 days the Patient will continue to work with RN Care Manager and/or Social Worker to address care management and care coordination needs related to frequent headaches with dizziness  as evidenced by adherence to care management team scheduled appointments      Interventions:   Evaluation of current treatment plan related to frequent headaches with dizziness, self-management and patient's adherence to plan as established by provider Determined patient is experiencing chronic headaches with dizziness Review of patient status, including review of consultant's reports, relevant laboratory and other test results, and medications completed Determined patient is not getting effectiveness from the medication prescribed, she plans to discuss this with her Neurologist during next scheduled visit  Assessed for possible triggers, patient has not been able to identify any triggers, her headaches started prior to her CVA Discussed patient continues to receive eye care from her Retina specialist and was advised her glaucoma is stable  Assessed for symptoms suggestive of orthostatic hypotension Educated patient re: fall precautions and discussed the importance of notifying her doctor of any/all falls that might occur   Pain Interventions: Pain assessment  performed Reviewed provider established plan for pain management Discussed importance of adherence to all scheduled medical appointments Counseled on the importance of reporting any/all new or changed pain symptoms or management strategies to pain management provider Advised patient to report to care team affect of pain on daily activities Discussed use of relaxation techniques and/or diversional activities to assist with pain reduction (distraction, imagery, relaxation, massage, acupressure, TENS, heat, and cold application Reviewed with patient prescribed pharmacological and nonpharmacological pain relief strategies Discussed plans with patient for ongoing care management follow up and provided patient with direct contact information for care management team  Patient Self-Care Activities:  Attend all scheduled provider appointments Call pharmacy for medication refills 3-7 days in advance of running out of medications Call provider office for new concerns or questions  Take medications as prescribed    Recommendation:   Keep your scheduled appointment with Dr. Octavia for your left Cataract removal scheduled for next week as directed  Specialty provider follow-up   09/24/2024 Status: Sch    Time: 3:00 PM Length: 20  Visit Type: OFFICE VISIT [1004] Copay: $40.00  Provider: Loni Soyla LABOR, MD Department: [facility]     09/25/2024 Status: Sch    Time: 10:30 AM Length: 30  Visit Type: HOSPITAL FU [111] Copay: $40.00  Provider: Onita Duos, MD Department: [facility]    Follow Up Plan:   Telephone follow up appointment date/time  10/05/2024 Status: Sch    Time: 1:00 PM Length: 30  Visit Type: VBCI TELEPHONE CALL 30 [2502] Copay: $0.00  Provider: Ezzard Rolin BIRCH, LCSW Department: [facility]  10/06/2024 Status: Sch    Time: 11:00 AM Length: 30  Visit Type: VBCI TELEPHONE CALL 30 [2502] Copay: $0.00  Provider: Morgan Clayborne CROME, RN Department:  [facility]          VBCI RN Care Plan related to Impaired Vision with planned Cataract Removal       Problems:  Chronic Disease Management support and education needs related to Impaired Vision with planned Cataract Removal   Goal: Over the next 30 days the Patient will continue to work with RN Care Manager and/or Social Worker to address care management and care coordination needs related to Impaired Vision with planned Cataract removal  as evidenced by adherence to care management team scheduled appointments      Interventions:   Evaluation of current treatment plan related to Impaired Vision with planned Cataract removal , self-management and patient's adherence to plan as established by provider Determined patient is experiencing impaired vision secondary to having Cataracts Determined patient is scheduled for Cataract surgery per Dr. Octavia next week, her sister will provide her transportation and accompany her to the appointment  Discussed plans with patient for ongoing care management follow up and provided patient with direct contact information for care management team  Patient Self-Care Activities:  Attend all scheduled provider appointments Call pharmacy for medication refills 3-7 days in advance of running out of medications Call provider office for new concerns or questions  Take medications as prescribed   Work with the nurse care manager to address care coordination needs and will continue to work with the clinical team to address health care and disease management related needs  Recommendation:   Specialty provider follow-up  Keep your scheduled appointment with Dr. Octavia for left eye Cataract removal as directed for next week   09/24/2024 Status: Sch    Time: 3:00 PM Length: 20  Visit Type: OFFICE VISIT [1004] Copay: $40.00  Provider: Loni Soyla LABOR, MD Department: [facility]     09/25/2024 Status: Sch    Time: 10:30 AM Length: 30  Visit  Type: HOSPITAL FU [111] Copay: $40.00  Provider: Onita Duos, MD Department: [facility]    Follow Up Plan:   Telephone follow up appointment date/time  10/05/2024 Status: Sch    Time: 1:00 PM Length: 30  Visit Type: VBCI TELEPHONE CALL 30 [2502] Copay: $0.00  Provider: Ezzard Rolin BIRCH, LCSW Department: [facility]    10/06/2024 Status: Sch    Time: 11:00 AM Length: 30  Visit Type: VBCI TELEPHONE CALL 30 [2502] Copay: $0.00  Provider: Morgan Clayborne CROME, RN Department: [facility]             Please call 1-800-273-TALK (toll free, 24 hour hotline) if you are experiencing a Mental Health or Behavioral Health Crisis or need someone to talk to.  Patient verbalized understanding of Care plan and visit instructions communicated this visit  Clayborne Morgan RN BSN CCM [facility]  [facility] Nurse Care Coordinator  Direct Dial: [phone] Website: [email or web address]

## 2024-09-25 ENCOUNTER — Other Ambulatory Visit: Payer: Self-pay | Admitting: Nurse Practitioner

## 2024-09-25 ENCOUNTER — Ambulatory Visit: Admitting: Neurology

## 2024-09-25 VITALS — BP 144/90 | HR 62 | Ht 65.5 in | Wt 180.6 lb

## 2024-09-25 DIAGNOSIS — I639 Cerebral infarction, unspecified: Secondary | ICD-10-CM

## 2024-09-25 DIAGNOSIS — R7303 Prediabetes: Secondary | ICD-10-CM

## 2024-09-25 DIAGNOSIS — G43719 Chronic migraine without aura, intractable, without status migrainosus: Secondary | ICD-10-CM

## 2024-09-25 LAB — CBC
Hematocrit: 37 % (ref 34.0–46.6)
Hemoglobin: 11.4 g/dL (ref 11.1–15.9)
MCH: 25.1 pg — ABNORMAL LOW (ref 26.6–33.0)
MCHC: 30.8 g/dL — ABNORMAL LOW (ref 31.5–35.7)
MCV: 81 fL (ref 79–97)
Platelets: 301 x10E3/uL (ref 150–450)
RBC: 4.55 x10E6/uL (ref 3.77–5.28)
RDW: 15.1 % (ref 11.7–15.4)
WBC: 11.6 x10E3/uL — ABNORMAL HIGH (ref 3.4–10.8)

## 2024-09-25 LAB — BASIC METABOLIC PANEL WITH GFR
BUN/Creatinine Ratio: 30 — AB (ref 12–28)
BUN: 31 mg/dL — AB (ref 8–27)
CO2: 21 mmol/L (ref 20–29)
Calcium: 9.8 mg/dL (ref 8.7–10.3)
Chloride: 104 mmol/L (ref 96–106)
Creatinine, Ser: 1.05 mg/dL — AB (ref 0.57–1.00)
Glucose: 101 mg/dL — AB (ref 70–99)
Potassium: 4.4 mmol/L (ref 3.5–5.2)
Sodium: 138 mmol/L (ref 134–144)
eGFR: 58 mL/min/1.73 — AB

## 2024-09-25 MED ORDER — CLOPIDOGREL BISULFATE 75 MG PO TABS: 75.0000 mg | ORAL_TABLET | Freq: Every day | ORAL | 11 refills | Status: AC

## 2024-09-25 MED ORDER — QULIPTA 60 MG PO TABS: 60.0000 mg | ORAL_TABLET | Freq: Every day | ORAL | 11 refills | Status: AC

## 2024-09-25 NOTE — Progress Notes (Signed)
 "  Chief Complaint  Patient presents with   RM 14    Patient is here for stroke follow-up - 07/30/2024.  L stroke,  going to [facility].     Hospitalization Follow-up      ASSESSMENT AND PLAN  [name] is a 69 y.o. 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   MEDICAL HISTORY:  [name], is here to follow-up her stroke, seen in request by her primary care nurse practitioner Georgina Speaks,  History is obtained from the patient and review of electronic medical records. I personally reviewed pertinent available imaging films in PACS.   PMHx of  HLD DM Diabetic peripheral neuropathy Use to smoke 1ppd, stop in August 2025,   I saw her in November 2025 for evaluation of worsening headache, which has been ongoing since July 2025,  This leads to MRI of the brain July 15, 2024, possible small acute infarction versus artifact along the prefer of the remote left parietal region, moderate to advanced small vessel disease  She was on aspirin  81 mg daily, no focal signs on examination, do have multiple vascular risk factors, echocardiogram, ultrasound of carotid artery were planned to complete the workup  She was taken by ambulance to emergency room July 30, 2024 for acute onset right arm weakness, right facial droop, dysarthria, received TNK  MRI of the  brain showed acute ischemic infarction involving left precentral gyri, chronic left parietal encephalomalacia,  CT angiogram of head and neck showed no large vessel disease  Echocardiogram, normal ejection fraction, no arterial shunt detected  Transcranial bubble study showed no shunt  Lower extremity Doppler study showed no evidence of DVT.  LDL of 47, A1c was 6.7, she was on aspirin  81 mg prior to hospital admission, dual antiplatelet agent for 3 weeks then Plavix  75 mg alone, concerning for cryptogenic stroke, -30 days cardiac monitoring, completed September 15, 2024, no significant abnormality detected  Saw  Cardiologist Dr. 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  PHYSICAL EXAMNIATION:  Gen: NAD, conversant, well nourised, well groomed                     Cardiovascular: Regular rate rhythm, no peripheral edema, warm, nontender. Eyes: Conjunctivae clear without exudates or hemorrhage Neck: Supple, no carotid bruits. Pulmonary: Clear to auscultation  bilaterally   NEUROLOGICAL EXAM:  MENTAL STATUS: Speech/cognition: Mild dysarthria, awake, alert, oriented to history taking and casual conversation CRANIAL NERVES: CN II: Visual fields are full to confrontation. Pupils are round equal and briskly reactive to light. CN III, IV, VI: extraocular movement are normal. No ptosis. CN V: Facial sensation is intact to light touch CN VII: Right lower face weakness  CN VIII: Hearing is normal to causal conversation. CN IX,  X: Phonation is normal. CN XI: Head turning and shoulder shrug are intact  MOTOR: Fixation of right arm upon rapid rotating movement  REFLEXES: Reflexes are 2  and symmetric at the biceps, triceps, knees, and ankles. Plantar responses are flexor.  SENSORY: Intact to light touch, pinprick and vibratory sensation are intact in fingers and toes.  COORDINATION: There is no trunk or limb dysmetria noted.  GAIT/STANCE: Push-up, mildly antalgic  REVIEW OF SYSTEMS:  Full 14 system review of systems performed and notable only for as above All other review of systems were negative.   ALLERGIES: Allergies[1]  HOME MEDICATIONS: Current Outpatient Medications  Medication Sig Dispense Refill   acetaminophen  (TYLENOL ) 325 MG tablet Take 2 tablets (650 mg total) by mouth every 6 (six) hours as needed for up to 30 doses for moderate pain (pain score 4-6), mild pain (pain score 1-3) or headache. 30 tablet 0   amLODipine  (NORVASC ) 5 MG tablet TAKE 1 TABLET (5 MG TOTAL) BY MOUTH DAILY. 90 tablet 1   Atogepant  (QULIPTA ) 60 MG TABS Take 60 mg by mouth daily.     atorvastatin  (LIPITOR) 10 MG tablet TAKE 1 TABLET (10 MG TOTAL) BY MOUTH DAILY FOR CHOLESTEROL 90 tablet 1   Blood Glucose Monitoring Suppl DEVI 1 each by Does not apply route in the morning, at noon, and at bedtime. May substitute to any manufacturer covered by patient's insurance. 1 each 0   buPROPion  (WELLBUTRIN  XL) 150 MG 24 hr tablet TAKE 1 TABLET (150 MG TOTAL) BY MOUTH EVERY MORNING. 30 tablet 2   clopidogrel  (PLAVIX ) 75 MG tablet Take 1 tablet (75 mg total) by mouth daily. 30 tablet 1   dorzolamide -timolol  (COSOPT ) 22.3-6.8 MG/ML ophthalmic solution Place 1 drop into both eyes 2 (two) times daily.     Doxepin  HCl 3 MG TABS Take 1 tablet (3 mg total) by mouth at bedtime as needed. 30 tablet 3   FEROSUL 325 (65 Fe) MG tablet TAKE 1 TABLET (325 MG TOTAL) BY MOUTH 2 (TWO) TIMES DAILY WITH A MEAL. 60 tablet 3   Glucose Blood (BLOOD GLUCOSE  TEST STRIPS) STRP 1 each by In Vitro route in the morning, at noon, and at bedtime. May substitute to any manufacturer covered by patient's insurance. 100 strip 2   Lancets (ONETOUCH DELICA PLUS LANCET33G) MISC USE TO CHECK BLOOD SUGAR ONCE A DAY AS INSTRUCTED 100 each 2   Lancets MISC 1 each by Does not apply route as directed. Dispense based on patient and insurance preference. Use up to four times daily as directed. (FOR ICD-10 E10.9, E11.9). 100 each 0   metFORMIN  (GLUCOPHAGE ) 500 MG tablet Take 1 tablet (500 mg total) by mouth daily with breakfast. 90 tablet 1   nortriptyline  (PAMELOR ) 10 MG capsule Take 2 capsules (20 mg total) by mouth at bedtime. 60 capsule 11   omeprazole  (PRILOSEC) 40 MG capsule Take 1 capsule (40 mg total) by mouth daily. 30 capsule 6   prednisoLONE acetate (PRED FORTE) 1 % ophthalmic suspension Place 1 drop into the right eye 4 (  four) times daily.     Semaglutide ,0.25 or 0.5MG /DOS, 2 MG/3ML SOPN Inject 0.5 mg into the skin once a week. 9 mL 1   trimethoprim -polymyxin b (POLYTRIM) ophthalmic solution Place 1 drop into both eyes See admin instructions. Administer 1 drop into each eye 4 times daily for 2 days after eye injections     valsartan  (DIOVAN ) 160 MG tablet Take 1 tablet (160 mg total) by mouth daily. 90 tablet 2   Vitamin D , Ergocalciferol , (DRISDOL ) 1.25 MG (50000 UNIT) CAPS capsule TAKE 1 CAPSULE (50,000 UNITS TOTAL) BY MOUTH EVERY 7 (SEVEN) DAYS. 12 capsule 0   aspirin  EC 81 MG tablet Take 1 tablet (81 mg total) by mouth daily. Swallow whole. (Patient not taking: Reported on 09/25/2024) 21 tablet 0   No current facility-administered medications for this visit.    PAST MEDICAL HISTORY: Past Medical History:  Diagnosis Date   Allergy    Anemia 01/07/2020   REQUIRING TRANSFUSION   Back pain    Depression    Diabetes mellitus without complication (HCC)    GERD (gastroesophageal reflux disease)    Hypertension    Myocardial infarction (HCC)    Olecranon  bursitis of left elbow    Sprain of left ankle 01/16/2023   Traumatic closed displaced fracture of shaft of right tibia and fibula 06/10/2016    PAST SURGICAL HISTORY: Past Surgical History:  Procedure Laterality Date   ABDOMINAL HYSTERECTOMY     spared ovaries   OLECRANON BURSECTOMY Left 06/19/2017   Procedure: Excision olecranon bursa;  Surgeon: Yvone Rush, MD;  Location: [facility];  Service: Orthopedics;  Laterality: Left;  Panel Length: 60 mins    TIBIA IM NAIL INSERTION Right 06/10/2016   Procedure: INTRAMEDULLARY (IM) NAIL TIBIAL RODING;  Surgeon: Rush Yvone, MD;  Location: [facility];  Service: Orthopedics;  Laterality: Right;    FAMILY HISTORY: Family History  Problem Relation Age of Onset   Diabetes Mother    Hypertension Mother    Diabetes Father    Hypertension Father    Breast cancer Sister    Breast cancer Sister    Breast cancer Paternal Aunt    Esophageal cancer Neg Hx    Liver disease Neg Hx    Colon cancer Neg Hx    Migraines Neg Hx    Stroke Neg Hx    Sleep apnea Neg Hx     SOCIAL HISTORY: Social History   Socioeconomic History   Marital status: Single    Spouse name: Not on file   Number of children: 0   Years of education: Not on file   Highest education level: Bachelor's degree (e.g., BA, AB, BS)  Occupational History   Occupation: retired  Tobacco Use   Smoking status: Former    Current packs/day: 0.00    Average packs/day: 0.5 packs/day for 30.0 years (15.0 ttl pk-yrs)    Types: Cigarettes    Quit date: 02/2024    Years since quitting: 0.6   Smokeless tobacco: Never   Tobacco comments:    started back in 2012, smoking 1/2 PPD - 05/07/2022; 10/02/2023 - continues to try to quit, quit 4 months ago  Vaping Use   Vaping status: Never Used  Substance and Sexual Activity   Alcohol use: No   Drug use: Not Currently    Frequency: 7.0 times per week    Types: Marijuana    Comment: yesterday   Sexual activity: Yes  Other Topics  Concern   Not on file  Social History Narrative   Lives alone but nephew is there sometimes to take care of her   Right handed   Caffeine: was a whole lot but for last 2 weeks has decreased to decaf coffee, no soda    Social Drivers of Health   Tobacco Use: Medium Risk (09/24/2024)   Patient History    Smoking Tobacco Use: Former    Smokeless Tobacco Use: Never    Passive Exposure: Not on file  Financial Resource Strain: Medium Risk (09/13/2024)   Overall Financial Resource Strain (CARDIA)    Difficulty of Paying Living Expenses: Somewhat hard  Food Insecurity: Food Insecurity Present (09/13/2024)   Epic    Worried About Programme Researcher, Broadcasting/film/video in the Last Year: Sometimes true    Ran Out of Food in the Last Year: Sometimes true  Transportation Needs: Patient Declined (09/13/2024)   Epic    Lack of Transportation (Medical): Patient declined    Lack of Transportation (Non-Medical): Patient declined  Physical Activity: Insufficiently Active (09/13/2024)   Exercise Vital Sign    Days of Exercise per Week: 2 days    Minutes of Exercise per Session: 20 min  Stress: No Stress Concern Present (09/13/2024)   [employer] of Occupational Health - Occupational Stress Questionnaire    Feeling of Stress: Only a little  Social Connections: Moderately Integrated (09/13/2024)   Social Connection and Isolation Panel    Frequency of Communication with Friends and Family: More than three times a week    Frequency of Social Gatherings with Friends and Family: More than three times a week    Attends Religious Services: More than 4 times per year    Active Member of Clubs or Organizations: Yes    Attends Banker Meetings: More than 4 times per year    Marital Status: Divorced  Intimate Partner Violence: Not At Risk (08/05/2024)   Epic    Fear of Current or Ex-Partner: No    Emotionally Abused: No    Physically Abused: No    Sexually Abused: No  Depression (PHQ2-9): Low Risk (08/13/2024)    Depression (PHQ2-9)    PHQ-2 Score: 1  Recent Concern: Depression (PHQ2-9) - Medium Risk (06/23/2024)   Depression (PHQ2-9)    PHQ-2 Score: 9  Alcohol Screen: Low Risk (10/02/2023)   Alcohol Screen    Last Alcohol Screening Score (AUDIT): 0  Housing: Low Risk (09/13/2024)   Epic    Unable to Pay for Housing in the Last Year: No    Number of Times Moved in the Last Year: 0    Homeless in the Last Year: No  Utilities: Not At Risk (07/30/2024)   Epic    Threatened with loss of utilities: No  Health Literacy: Adequate Health Literacy (10/02/2023)   B1300 Health Literacy    Frequency of need for help with medical instructions: Never      Modena Callander, M.D. Ph.D.  [facility] [address] [place], [place] [address] Ph: [phone] Fax: [phone]  CC:  Jerri Pfeiffer, MD [address] [place],  [place] [address]  Georgina Speaks, FNP      [1] No Known Allergies  "

## 2024-09-27 NOTE — Assessment & Plan Note (Addendum)
Blood pressure is fairly controlled. Continue current medications

## 2024-09-27 NOTE — Assessment & Plan Note (Signed)
 Chronic occipital headaches unresponsive to nortriptyline , Ubrelvy , Nurtec, and Toradol . Differential includes tension-type headache and new onset migraines. Awaiting sleep study for sleep apnea evaluation. Discussed topiramate and Qulipta  for migraine management. Qulipta  considered benign with minimal side effects. - Communicated with neurologist for further management suggestions. - Consider topiramate for migraine management. - Provide Qulipta  samples if available for trial. - Await sleep study results for sleep apnea evaluation.

## 2024-09-27 NOTE — Assessment & Plan Note (Addendum)
HgbA1c has been stable, continue current medications

## 2024-09-29 ENCOUNTER — Inpatient Hospital Stay: Payer: Self-pay | Admitting: Internal Medicine

## 2024-09-29 ENCOUNTER — Ambulatory Visit: Payer: Self-pay | Admitting: Internal Medicine

## 2024-09-30 ENCOUNTER — Telehealth: Payer: Self-pay

## 2024-09-30 DIAGNOSIS — I119 Hypertensive heart disease without heart failure: Secondary | ICD-10-CM

## 2024-09-30 NOTE — Progress Notes (Signed)
 Pharmacy Quality Measure Review  This patient is appearing on a report for being at risk of failing the adherence measure for hypertension (ACEi/ARB) medications this calendar year.   Medication: Valsartan  160mg  tablet Last fill date: 09/28/2024 for 90 day supply  Called pharmacy to confirm the medication was picked up on 09/28/2024. Insurance report was not up to date. No action needed at this time.    Sahil Milner Student-PharmD

## 2024-10-01 ENCOUNTER — Encounter: Payer: Self-pay | Admitting: Nurse Practitioner

## 2024-10-01 NOTE — Telephone Encounter (Signed)
 NPSG [name]: [phone] (exp. 09/18/24 to 12/12/24)  Patient is scheduled at [facility] for 10/22/24 at 9 pm.  Mailed packet and sent mychart

## 2024-10-01 NOTE — Telephone Encounter (Signed)
 Patient called to r/s her SS.  She is r/s for 10/28/24 at 9 pm.

## 2024-10-05 ENCOUNTER — Other Ambulatory Visit: Payer: Self-pay | Admitting: Licensed Clinical Social Worker

## 2024-10-05 NOTE — Patient Instructions (Signed)
 Visit Information  Thank you for taking time to visit with me today. Please don't hesitate to contact me if I can be of assistance to you before our next scheduled appointment.  Your next care management appointment is by telephone on 2/23 at 1 PM  Please call the care guide team at [phone] if you need to cancel, schedule, or reschedule an appointment.   Please call the Suicide and Crisis Lifeline: 988 go to [facility] [address] [place] [phone]) call 911 if you are experiencing a Mental Health or Behavioral Health Crisis or need someone to talk to.  Rolin Kerns, LCSW [place]  [facility], [facility] Clinical Social Worker Direct Dial: [phone]  Fax: [phone] Website: [email or web address] 1:27 PM

## 2024-10-05 NOTE — Patient Outreach (Signed)
 Complex Care Management   Visit Note  10/05/2024  Name:  [name] MRN: [id number] DOB: [birth year]  Situation: Referral received for Complex Care Management related to Mental/Behavioral Health diagnosis Depression/Stress I obtained verbal consent from Patient.  Visit completed with Patient  on the phone  Background:   Past Medical History:  Diagnosis Date   Allergy    Anemia 01/07/2020   REQUIRING TRANSFUSION   Back pain    Depression    Diabetes mellitus without complication (HCC)    GERD (gastroesophageal reflux disease)    Hypertension    Myocardial infarction (HCC)    Olecranon bursitis of left elbow    Sprain of left ankle 01/16/2023   Traumatic closed displaced fracture of shaft of right tibia and fibula 06/10/2016    Assessment: Patient Reported Symptoms:  Cognitive Cognitive Status: No symptoms reported, Alert and oriented to person, place, and time Cognitive/Intellectual Conditions Management [RPT]: None reported or documented in medical history or problem list   Health Maintenance Behaviors: Annual physical exam  Neurological Neurological Review of Symptoms: Headaches Neurological Management Strategies: Medication therapy, Routine screening, Coping strategies Neurological Comment: Pt reports medications have not assisted with relief from HA. She also utilizes peppermint oil to assist with symptoms with limited relief  HEENT HEENT Symptoms Reported: No symptoms reported HEENT Management Strategies: Medication therapy HEENT Comment: Patient continues to administer eye drops, as prescribed. Re-scheduled cataract surgery for 10/23/24    Cardiovascular Cardiovascular Symptoms Reported: Dizziness, Fatigue Cardiovascular Management Strategies: Coping strategies, Routine screening, Medication therapy  Respiratory Respiratory Symptoms Reported: No symptoms reported    Endocrine Endocrine Symptoms Reported: No symptoms reported    Gastrointestinal Gastrointestinal  Symptoms Reported: Not assessed      Genitourinary Genitourinary Symptoms Reported: Not assessed    Integumentary Integumentary Symptoms Reported: No symptoms reported    Musculoskeletal Musculoskelatal Symptoms Reviewed: Not assessed        Psychosocial Psychosocial Symptoms Reported: Other Other Psychosocial Conditions: Stress, Low energy Behavioral Management Strategies: Adequate rest, Coping strategies, Exercise, Support system Behavioral Health Comment: Patient identified strategies to assist with management of symptoms Major Change/Loss/Stressor/Fears (CP): Medical condition, self Techniques to Cope with Loss/Stress/Change: Diversional activities, Spiritual practice(s) Quality of Family Relationships: involved, helpful    10/05/2024    PHQ2-9 Depression Screening   Little interest or pleasure in doing things    Feeling down, depressed, or hopeless    PHQ-2 - Total Score    Trouble falling or staying asleep, or sleeping too much    Feeling tired or having little energy    Poor appetite or overeating     Feeling bad about yourself - or that you are a failure or have let yourself or your family down    Trouble concentrating on things, such as reading the newspaper or watching television    Moving or speaking so slowly that other people could have noticed.  Or the opposite - being so fidgety or restless that you have been moving around a lot more than usual    Thoughts that you would be better off dead, or hurting yourself in some way    PHQ2-9 Total Score    If you checked off any problems, how difficult have these problems made it for you to do your work, take care of things at home, or get along with other people    Depression Interventions/Treatment      There were no vitals filed for this visit.    Medications Reviewed Today  Reviewed by Ezzard Rolin BIRCH, LCSW (Social Worker) on 10/05/24 at 1303  Med List Status: <None>   Medication Order Taking? Sig Documenting  Provider Last Dose Status Informant  acetaminophen  (TYLENOL ) 325 MG tablet [id number]  Take 2 tablets (650 mg total) by mouth every 6 (six) hours as needed for up to 30 doses for moderate pain (pain score 4-6), mild pain (pain score 1-3) or headache. Cottie Donnice PARAS, MD  Active Self, Pharmacy Records  amLODipine  (NORVASC ) 5 MG tablet [id number]  TAKE 1 TABLET (5 MG TOTAL) BY MOUTH DAILY. Georgina Speaks, FNP  Active Self, Pharmacy Records  aspirin  EC 81 MG tablet [id number]  Take 1 tablet (81 mg total) by mouth daily. Swallow whole.  Patient not taking: Reported on 09/25/2024   Toberman, Stevi W, NP  Active   Atogepant  (QULIPTA ) 60 MG TABS [id number]  Take 60 mg by mouth daily. [provider]  Active   Atogepant  (QULIPTA ) 60 MG TABS [id number]  Take 1 tablet (60 mg total) by mouth daily. Onita Duos, MD  Active   atorvastatin  (LIPITOR) 10 MG tablet [id number]  TAKE 1 TABLET (10 MG TOTAL) BY MOUTH DAILY FOR CHOLESTEROL Georgina Speaks, FNP  Active Self, Pharmacy Records  Blood Glucose Monitoring Suppl DEVI [id number]  1 each by Does not apply route in the morning, at noon, and at bedtime. May substitute to any manufacturer covered by patient's insurance. Georgina Speaks, FNP  Active   buPROPion  (WELLBUTRIN  XL) 150 MG 24 hr tablet [id number]  TAKE 1 TABLET (150 MG TOTAL) BY MOUTH EVERY MORNING. Georgina Speaks, FNP  Active   clopidogrel  (PLAVIX ) 75 MG tablet [id number]  Take 1 tablet (75 mg total) by mouth daily. Onita Duos, MD  Active   dorzolamide -timolol  (COSOPT ) 22.3-6.8 MG/ML ophthalmic solution [id number]  Place 1 drop into both eyes 2 (two) times daily. [provider]  Active Self, Pharmacy Records  Doxepin  HCl 3 MG TABS [id number]  Take 1 tablet (3 mg total) by mouth at bedtime as needed. Georgina Speaks, FNP  Active Self, Pharmacy Records  FEROSUL [phone]) MG tablet [id number]  TAKE 1 TABLET (325 MG TOTAL) BY MOUTH 2 (TWO) TIMES DAILY WITH A MEAL. Georgina Speaks, FNP  Active Self, Pharmacy  Records  Glucose Blood (BLOOD GLUCOSE TEST STRIPS) STRP [id number]  1 each by In Vitro route in the morning, at noon, and at bedtime. May substitute to any manufacturer covered by patient's insurance. Georgina Speaks, FNP  Active   Lancets [facility] CATHRYNE PLUS [place]) MISC [id number]  USE TO CHECK BLOOD SUGAR ONCE A DAY AS INSTRUCTED Georgina Speaks, FNP  Active Self, Pharmacy Records  Lancets MISC [id number]  1 each by Does not apply route as directed. Dispense based on patient and insurance preference. Use up to four times daily as directed. (FOR ICD-10 E10.9, E11.9). Georgina Speaks, FNP  Active   metFORMIN  (GLUCOPHAGE ) 500 MG tablet [id number]  TAKE 1 TABLET (500 MG TOTAL) BY MOUTH DAILY WITH BREAKFAST. Georgina Speaks, FNP  Active   nortriptyline  (PAMELOR ) 10 MG capsule [id number]  Take 2 capsules (20 mg total) by mouth at bedtime. Onita Duos, MD  Active Self, Pharmacy Records  omeprazole  [facility]) 40 MG capsule [id number]  Take 1 capsule (40 mg total) by mouth daily. Federico Rosario BROCKS, MD  Active Self, Pharmacy Records  prednisoLONE acetate (PRED FORTE) 1 % ophthalmic suspension [id number]  Place 1 drop into the right eye 4 (four) times daily. [provider]  Active   Semaglutide ,0.25 or 0.5MG /DOS, 2  MG/3ML SOPN [id number]  Inject 0.5 mg into the skin once a week. Georgina Speaks, FNP  Active   trimethoprim -polymyxin b (POLYTRIM) ophthalmic solution [id number]  Place 1 drop into both eyes See admin instructions. Administer 1 drop into each eye 4 times daily for 2 days after eye injections [provider]  Active Self, Pharmacy Records  valsartan  (DIOVAN ) 160 MG tablet [id number]  Take 1 tablet (160 mg total) by mouth daily. Georgina Speaks, FNP  Active Self, Pharmacy Records  Vitamin D , Ergocalciferol , (DRISDOL ) 1.25 MG (50000 UNIT) CAPS capsule [id number]  TAKE 1 CAPSULE (50,000 UNITS TOTAL) BY MOUTH EVERY 7 (SEVEN) DAYS. Georgina Speaks, FNP  Active             Recommendation:   Continue  Current Plan of Care  Follow Up Plan:   Telephone follow-up in 1 month  Rolin Kerns, LCSW [facility]  [facility], [facility] Clinical Social Worker Direct Dial: [phone]  Fax: [phone] Website: [email or web address] 1:26 PM

## 2024-10-06 ENCOUNTER — Telehealth

## 2024-10-14 ENCOUNTER — Encounter: Payer: Self-pay | Admitting: Nurse Practitioner

## 2024-10-14 ENCOUNTER — Encounter: Payer: Self-pay | Admitting: Neurology

## 2024-10-14 DIAGNOSIS — I639 Cerebral infarction, unspecified: Secondary | ICD-10-CM

## 2024-10-14 DIAGNOSIS — G43719 Chronic migraine without aura, intractable, without status migrainosus: Secondary | ICD-10-CM

## 2024-10-14 DIAGNOSIS — R519 Headache, unspecified: Secondary | ICD-10-CM

## 2024-10-15 ENCOUNTER — Other Ambulatory Visit: Payer: Self-pay | Admitting: *Deleted

## 2024-10-15 DIAGNOSIS — I6389 Other cerebral infarction: Secondary | ICD-10-CM

## 2024-10-15 MED ORDER — BUTALBITAL-APAP-CAFFEINE 50-325-40 MG PO TABS: 1.0000 | ORAL_TABLET | Freq: Four times a day (QID) | ORAL | 3 refills | Status: AC | PRN

## 2024-10-15 NOTE — Telephone Encounter (Signed)
 Meds ordered this encounter  Medications   butalbital -acetaminophen -caffeine  (FIORICET) 50-325-40 MG tablet    Sig: Take 1 tablet by mouth every 6 (six) hours as needed for headache.    Dispense:  10 tablet    Refill:  3    Do not refill in less than 30 days

## 2024-10-16 ENCOUNTER — Encounter (INDEPENDENT_AMBULATORY_CARE_PROVIDER_SITE_OTHER): Admitting: Ophthalmology

## 2024-10-19 ENCOUNTER — Telehealth

## 2024-10-20 ENCOUNTER — Encounter (HOSPITAL_COMMUNITY): Admission: RE | Payer: Self-pay | Source: Home / Self Care

## 2024-10-20 ENCOUNTER — Ambulatory Visit (HOSPITAL_COMMUNITY): Admission: RE | Admit: 2024-10-20 | Source: Home / Self Care | Admitting: Internal Medicine

## 2024-10-20 SURGERY — TRANSESOPHAGEAL ECHOCARDIOGRAM (TEE) (CATHLAB)
Anesthesia: Monitor Anesthesia Care

## 2024-10-21 ENCOUNTER — Ambulatory Visit: Payer: Medicare PPO

## 2024-10-21 ENCOUNTER — Ambulatory Visit: Payer: Self-pay

## 2024-10-21 ENCOUNTER — Ambulatory Visit: Payer: Self-pay | Admitting: Nurse Practitioner

## 2024-10-22 ENCOUNTER — Encounter

## 2024-10-22 ENCOUNTER — Telehealth

## 2024-10-28 ENCOUNTER — Encounter

## 2024-10-30 ENCOUNTER — Ambulatory Visit: Admitting: Podiatry

## 2024-11-02 ENCOUNTER — Telehealth: Admitting: Licensed Clinical Social Worker

## 2024-11-04 ENCOUNTER — Ambulatory Visit: Admitting: Neurology

## 2024-11-16 ENCOUNTER — Encounter (INDEPENDENT_AMBULATORY_CARE_PROVIDER_SITE_OTHER): Admitting: Ophthalmology
